# Patient Record
Sex: Male | Born: 1935 | Race: White | Hispanic: No | Marital: Married | Smoking: Current every day smoker
Health system: Southern US, Community
[De-identification: ages and names within clinical notes are randomized; demographics above are authoritative.]

## PROBLEM LIST (undated history)

## (undated) DIAGNOSIS — M1990 Unspecified osteoarthritis, unspecified site: Secondary | ICD-10-CM

## (undated) DIAGNOSIS — I4891 Unspecified atrial fibrillation: Secondary | ICD-10-CM

## (undated) DIAGNOSIS — I10 Essential (primary) hypertension: Secondary | ICD-10-CM

## (undated) DIAGNOSIS — Z720 Tobacco use: Secondary | ICD-10-CM

## (undated) DIAGNOSIS — J449 Chronic obstructive pulmonary disease, unspecified: Secondary | ICD-10-CM

## (undated) DIAGNOSIS — G960 Cerebrospinal fluid leak: Secondary | ICD-10-CM

## (undated) DIAGNOSIS — I2510 Atherosclerotic heart disease of native coronary artery without angina pectoris: Secondary | ICD-10-CM

## (undated) DIAGNOSIS — I509 Heart failure, unspecified: Secondary | ICD-10-CM

## (undated) DIAGNOSIS — L409 Psoriasis, unspecified: Secondary | ICD-10-CM

## (undated) DIAGNOSIS — J8410 Pulmonary fibrosis, unspecified: Secondary | ICD-10-CM

## (undated) DIAGNOSIS — F1010 Alcohol abuse, uncomplicated: Secondary | ICD-10-CM

## (undated) DIAGNOSIS — C4490 Unspecified malignant neoplasm of skin, unspecified: Secondary | ICD-10-CM

## (undated) HISTORY — PX: ROTATOR CUFF REPAIR: SHX139

## (undated) HISTORY — PX: CHEST TUBE INSERTION: SHX231

## (undated) HISTORY — DX: Tobacco use: Z72.0

## (undated) HISTORY — PX: HAND SURGERY: SHX662

## (undated) HISTORY — DX: Pulmonary fibrosis, unspecified: J84.10

## (undated) HISTORY — DX: Unspecified osteoarthritis, unspecified site: M19.90

## (undated) HISTORY — DX: Essential (primary) hypertension: I10

## (undated) HISTORY — PX: INGUINAL HERNIA REPAIR: SHX194

---

## 1998-11-28 ENCOUNTER — Ambulatory Visit (HOSPITAL_BASED_OUTPATIENT_CLINIC_OR_DEPARTMENT_OTHER): Admission: RE | Admit: 1998-11-28 | Discharge: 1998-11-28 | Payer: Self-pay | Admitting: Orthopedic Surgery

## 2001-02-16 ENCOUNTER — Other Ambulatory Visit: Admission: RE | Admit: 2001-02-16 | Discharge: 2001-02-16 | Payer: Self-pay | Admitting: Dermatology

## 2005-01-07 ENCOUNTER — Emergency Department (HOSPITAL_COMMUNITY): Admission: EM | Admit: 2005-01-07 | Discharge: 2005-01-07 | Payer: Self-pay | Admitting: Emergency Medicine

## 2005-02-27 ENCOUNTER — Ambulatory Visit: Payer: Self-pay | Admitting: Family Medicine

## 2005-03-07 ENCOUNTER — Ambulatory Visit (HOSPITAL_COMMUNITY): Admission: RE | Admit: 2005-03-07 | Discharge: 2005-03-07 | Payer: Self-pay | Admitting: Family Medicine

## 2005-03-13 ENCOUNTER — Ambulatory Visit: Payer: Self-pay | Admitting: Family Medicine

## 2005-03-20 ENCOUNTER — Ambulatory Visit (HOSPITAL_COMMUNITY): Admission: RE | Admit: 2005-03-20 | Discharge: 2005-03-20 | Payer: Self-pay | Admitting: Family Medicine

## 2005-03-20 DIAGNOSIS — G9608 Other cranial cerebrospinal fluid leak: Secondary | ICD-10-CM

## 2005-03-20 HISTORY — DX: Other cranial cerebrospinal fluid leak: G96.08

## 2005-03-27 ENCOUNTER — Other Ambulatory Visit: Admission: RE | Admit: 2005-03-27 | Discharge: 2005-03-27 | Payer: Self-pay | Admitting: Dermatology

## 2005-09-19 ENCOUNTER — Ambulatory Visit: Payer: Self-pay | Admitting: Internal Medicine

## 2005-11-14 ENCOUNTER — Ambulatory Visit: Payer: Self-pay | Admitting: Internal Medicine

## 2005-12-17 ENCOUNTER — Ambulatory Visit: Payer: Self-pay | Admitting: Internal Medicine

## 2006-01-21 ENCOUNTER — Ambulatory Visit: Payer: Self-pay | Admitting: Internal Medicine

## 2006-04-23 ENCOUNTER — Encounter: Payer: Self-pay | Admitting: Internal Medicine

## 2006-05-29 ENCOUNTER — Ambulatory Visit: Payer: Self-pay | Admitting: Internal Medicine

## 2006-05-29 LAB — CONVERTED CEMR LAB
BUN: 10 mg/dL (ref 6–23)
CO2: 22 meq/L (ref 19–32)
Calcium: 9.4 mg/dL (ref 8.4–10.5)
Chloride: 101 meq/L (ref 96–112)
Cholesterol: 196 mg/dL (ref 0–200)
Creatinine, Ser: 0.67 mg/dL (ref 0.40–1.50)
Glucose, Bld: 91 mg/dL (ref 70–99)
HDL: 74 mg/dL (ref >39)
LDL Cholesterol: 109 mg/dL — ABNORMAL HIGH (ref 0–99)
Potassium: 4.5 meq/L (ref 3.5–5.3)
Sodium: 139 meq/L (ref 135–145)
Total CHOL/HDL Ratio: 2.6
Triglycerides: 67 mg/dL (ref <150)
VLDL: 13 mg/dL (ref 0–40)

## 2007-12-18 ENCOUNTER — Encounter (INDEPENDENT_AMBULATORY_CARE_PROVIDER_SITE_OTHER): Payer: Self-pay | Admitting: Internal Medicine

## 2007-12-24 ENCOUNTER — Ambulatory Visit: Payer: Self-pay | Admitting: Internal Medicine

## 2007-12-24 DIAGNOSIS — J309 Allergic rhinitis, unspecified: Secondary | ICD-10-CM | POA: Insufficient documentation

## 2008-01-05 ENCOUNTER — Encounter (INDEPENDENT_AMBULATORY_CARE_PROVIDER_SITE_OTHER): Payer: Self-pay | Admitting: Internal Medicine

## 2008-02-23 ENCOUNTER — Ambulatory Visit: Payer: Self-pay | Admitting: Internal Medicine

## 2008-02-23 LAB — CONVERTED CEMR LAB
Bilirubin Urine: NEGATIVE
Blood in Urine, dipstick: NEGATIVE
Glucose, Urine, Semiquant: NEGATIVE
Ketones, urine, test strip: NEGATIVE
Nitrite: NEGATIVE
Protein, U semiquant: NEGATIVE
Specific Gravity, Urine: 1.01
Urobilinogen, UA: NEGATIVE
WBC Urine, dipstick: NEGATIVE
pH: 7

## 2008-06-21 ENCOUNTER — Encounter (INDEPENDENT_AMBULATORY_CARE_PROVIDER_SITE_OTHER): Payer: Self-pay | Admitting: Internal Medicine

## 2008-09-29 ENCOUNTER — Encounter (INDEPENDENT_AMBULATORY_CARE_PROVIDER_SITE_OTHER): Payer: Self-pay | Admitting: Internal Medicine

## 2008-10-13 ENCOUNTER — Ambulatory Visit: Payer: Self-pay | Admitting: Internal Medicine

## 2008-10-14 ENCOUNTER — Encounter (INDEPENDENT_AMBULATORY_CARE_PROVIDER_SITE_OTHER): Payer: Self-pay | Admitting: Internal Medicine

## 2008-10-14 LAB — CONVERTED CEMR LAB
BUN: 13 mg/dL (ref 6–23)
CO2: 22 meq/L (ref 19–32)
Calcium: 9.7 mg/dL (ref 8.4–10.5)
Chloride: 103 meq/L (ref 96–112)
Cholesterol: 178 mg/dL (ref 0–200)
Creatinine, Ser: 0.83 mg/dL (ref 0.40–1.50)
Glucose, Bld: 94 mg/dL (ref 70–99)
HDL: 59 mg/dL (ref >39)
LDL Cholesterol: 105 mg/dL — ABNORMAL HIGH (ref 0–99)
Potassium: 4.7 meq/L (ref 3.5–5.3)
Sodium: 137 meq/L (ref 135–145)
Total CHOL/HDL Ratio: 3
Triglycerides: 70 mg/dL (ref <150)
VLDL: 14 mg/dL (ref 0–40)

## 2009-02-24 ENCOUNTER — Encounter (INDEPENDENT_AMBULATORY_CARE_PROVIDER_SITE_OTHER): Payer: Self-pay | Admitting: Internal Medicine

## 2009-04-18 ENCOUNTER — Encounter: Payer: Self-pay | Admitting: Internal Medicine

## 2009-04-18 ENCOUNTER — Ambulatory Visit (HOSPITAL_COMMUNITY): Admission: RE | Admit: 2009-04-18 | Discharge: 2009-04-18 | Payer: Self-pay | Admitting: Internal Medicine

## 2009-05-02 ENCOUNTER — Ambulatory Visit: Payer: Self-pay | Admitting: Internal Medicine

## 2009-05-02 LAB — CONVERTED CEMR LAB
Pro B Natriuretic peptide (BNP): 1531 pg/mL — ABNORMAL HIGH (ref 0.0–100.0)
Sed Rate: 3 mm/hr (ref 0–22)

## 2009-05-11 ENCOUNTER — Ambulatory Visit: Payer: Self-pay | Admitting: Internal Medicine

## 2009-05-11 ENCOUNTER — Encounter: Payer: Self-pay | Admitting: Internal Medicine

## 2009-05-11 ENCOUNTER — Ambulatory Visit: Payer: Self-pay | Admitting: Cardiology

## 2009-05-11 ENCOUNTER — Ambulatory Visit: Payer: Self-pay

## 2009-05-11 ENCOUNTER — Ambulatory Visit (HOSPITAL_COMMUNITY): Admission: RE | Admit: 2009-05-11 | Discharge: 2009-05-11 | Payer: Self-pay | Admitting: Internal Medicine

## 2009-05-11 DIAGNOSIS — I5021 Acute systolic (congestive) heart failure: Secondary | ICD-10-CM | POA: Insufficient documentation

## 2009-05-15 ENCOUNTER — Ambulatory Visit: Payer: Self-pay | Admitting: Internal Medicine

## 2009-05-15 ENCOUNTER — Ambulatory Visit: Payer: Self-pay | Admitting: Cardiology

## 2009-05-15 DIAGNOSIS — R0602 Shortness of breath: Secondary | ICD-10-CM | POA: Insufficient documentation

## 2009-05-16 LAB — CONVERTED CEMR LAB
BUN: 12 mg/dL (ref 6–23)
Basophils Absolute: 0 10*3/uL (ref 0.0–0.1)
Basophils Relative: 0 % (ref 0.0–3.0)
CO2: 31 meq/L (ref 19–32)
Calcium: 9.6 mg/dL (ref 8.4–10.5)
Chloride: 95 meq/L — ABNORMAL LOW (ref 96–112)
Creatinine, Ser: 1 mg/dL (ref 0.4–1.5)
Eosinophils Absolute: 0.2 10*3/uL (ref 0.0–0.7)
Eosinophils Relative: 2.3 % (ref 0.0–5.0)
Ferritin: 135.9 ng/mL (ref 22.0–322.0)
GFR calc non Af Amer: 78.24 mL/min (ref >60)
Glucose, Bld: 72 mg/dL (ref 70–99)
HCT: 53.4 % — ABNORMAL HIGH (ref 39.0–52.0)
Hemoglobin: 18.1 g/dL — ABNORMAL HIGH (ref 13.0–17.0)
Lymphocytes Relative: 41.1 % (ref 12.0–46.0)
Lymphs Abs: 3.4 10*3/uL (ref 0.7–4.0)
MCHC: 33.9 g/dL (ref 30.0–36.0)
MCV: 106.6 fL — ABNORMAL HIGH (ref 78.0–100.0)
Monocytes Absolute: 0.8 10*3/uL (ref 0.1–1.0)
Monocytes Relative: 9.1 % (ref 3.0–12.0)
Neutro Abs: 3.9 10*3/uL (ref 1.4–7.7)
Neutrophils Relative %: 47.5 % (ref 43.0–77.0)
Platelets: 146 10*3/uL — ABNORMAL LOW (ref 150.0–400.0)
Potassium: 4.1 meq/L (ref 3.5–5.1)
Pro B Natriuretic peptide (BNP): 695 pg/mL — ABNORMAL HIGH (ref 0.0–100.0)
RBC: 5.01 M/uL (ref 4.22–5.81)
RDW: 12.9 % (ref 11.5–14.6)
Sodium: 136 meq/L (ref 135–145)
TSH: 1.89 microintl units/mL (ref 0.35–5.50)
WBC: 8.3 10*3/uL (ref 4.5–10.5)

## 2009-05-25 ENCOUNTER — Encounter (INDEPENDENT_AMBULATORY_CARE_PROVIDER_SITE_OTHER): Payer: Self-pay | Admitting: *Deleted

## 2009-05-25 ENCOUNTER — Ambulatory Visit: Payer: Self-pay | Admitting: Cardiology

## 2009-05-25 DIAGNOSIS — R21 Rash and other nonspecific skin eruption: Secondary | ICD-10-CM

## 2009-05-25 LAB — CONVERTED CEMR LAB
BUN: 13 mg/dL (ref 6–23)
Basophils Absolute: 0 10*3/uL (ref 0.0–0.1)
Basophils Relative: 0.4 % (ref 0.0–3.0)
CO2: 31 meq/L (ref 19–32)
Calcium: 9.4 mg/dL (ref 8.4–10.5)
Chloride: 99 meq/L (ref 96–112)
Creatinine, Ser: 1.1 mg/dL (ref 0.4–1.5)
Eosinophils Absolute: 0.2 10*3/uL (ref 0.0–0.7)
Eosinophils Relative: 2.3 % (ref 0.0–5.0)
GFR calc non Af Amer: 70.08 mL/min (ref >60)
Glucose, Bld: 115 mg/dL — ABNORMAL HIGH (ref 70–99)
HCT: 52.4 % — ABNORMAL HIGH (ref 39.0–52.0)
Hemoglobin: 17.4 g/dL — ABNORMAL HIGH (ref 13.0–17.0)
INR: 1 (ref 0.8–1.0)
Lymphocytes Relative: 54.6 % — ABNORMAL HIGH (ref 12.0–46.0)
Lymphs Abs: 5.6 10*3/uL — ABNORMAL HIGH (ref 0.7–4.0)
MCHC: 33.2 g/dL (ref 30.0–36.0)
MCV: 105.9 fL — ABNORMAL HIGH (ref 78.0–100.0)
Monocytes Absolute: 0.5 10*3/uL (ref 0.1–1.0)
Monocytes Relative: 5.3 % (ref 3.0–12.0)
Neutro Abs: 3.7 10*3/uL (ref 1.4–7.7)
Neutrophils Relative %: 37.4 % — ABNORMAL LOW (ref 43.0–77.0)
Platelets: 158 10*3/uL (ref 150.0–400.0)
Potassium: 4.3 meq/L (ref 3.5–5.1)
Prothrombin Time: 10.5 s (ref 9.1–11.7)
RBC: 4.95 M/uL (ref 4.22–5.81)
RDW: 12.9 % (ref 11.5–14.6)
Sodium: 138 meq/L (ref 135–145)
WBC: 10 10*3/uL (ref 4.5–10.5)
aPTT: 26.7 s (ref 21.7–28.8)

## 2009-05-26 ENCOUNTER — Telehealth: Payer: Self-pay | Admitting: Cardiology

## 2009-06-01 ENCOUNTER — Inpatient Hospital Stay (HOSPITAL_BASED_OUTPATIENT_CLINIC_OR_DEPARTMENT_OTHER): Admission: RE | Admit: 2009-06-01 | Discharge: 2009-06-01 | Payer: Self-pay | Admitting: Cardiovascular Disease

## 2009-06-01 ENCOUNTER — Ambulatory Visit: Payer: Self-pay | Admitting: Cardiology

## 2009-06-07 ENCOUNTER — Ambulatory Visit: Payer: Self-pay | Admitting: Internal Medicine

## 2009-06-16 ENCOUNTER — Ambulatory Visit: Payer: Self-pay | Admitting: Cardiology

## 2009-08-17 ENCOUNTER — Telehealth: Payer: Self-pay | Admitting: Cardiology

## 2009-08-17 ENCOUNTER — Ambulatory Visit: Payer: Self-pay | Admitting: Cardiology

## 2009-08-29 ENCOUNTER — Ambulatory Visit: Payer: Self-pay | Admitting: Cardiology

## 2009-08-29 LAB — CONVERTED CEMR LAB
BUN: 14 mg/dL (ref 6–23)
CO2: 31 meq/L (ref 19–32)
Calcium: 9.4 mg/dL (ref 8.4–10.5)
Chloride: 101 meq/L (ref 96–112)
GFR calc non Af Amer: 88.28 mL/min (ref >60)
Glucose, Bld: 107 mg/dL — ABNORMAL HIGH (ref 70–99)
Potassium: 4.8 meq/L (ref 3.5–5.1)
Sodium: 141 meq/L (ref 135–145)

## 2009-12-07 ENCOUNTER — Ambulatory Visit: Payer: Self-pay | Admitting: Cardiology

## 2009-12-21 ENCOUNTER — Ambulatory Visit: Payer: Self-pay

## 2009-12-21 ENCOUNTER — Ambulatory Visit (HOSPITAL_COMMUNITY): Admission: RE | Admit: 2009-12-21 | Discharge: 2009-12-21 | Payer: Self-pay | Admitting: Cardiology

## 2009-12-21 ENCOUNTER — Ambulatory Visit: Payer: Self-pay | Admitting: Cardiovascular Disease

## 2010-04-03 ENCOUNTER — Encounter: Payer: Self-pay | Admitting: Cardiology

## 2010-04-03 ENCOUNTER — Ambulatory Visit: Payer: Self-pay | Admitting: Cardiology

## 2010-04-04 ENCOUNTER — Telehealth (INDEPENDENT_AMBULATORY_CARE_PROVIDER_SITE_OTHER): Payer: Self-pay | Admitting: *Deleted

## 2010-04-04 DIAGNOSIS — I498 Other specified cardiac arrhythmias: Secondary | ICD-10-CM

## 2010-04-05 ENCOUNTER — Telehealth (INDEPENDENT_AMBULATORY_CARE_PROVIDER_SITE_OTHER): Payer: Self-pay | Admitting: *Deleted

## 2010-04-09 ENCOUNTER — Ambulatory Visit: Payer: Self-pay | Admitting: Cardiology

## 2010-04-10 ENCOUNTER — Ambulatory Visit: Payer: Self-pay | Admitting: Cardiology

## 2010-04-13 LAB — CONVERTED CEMR LAB
BUN: 16 mg/dL (ref 6–23)
CO2: 30 meq/L (ref 19–32)
Calcium: 9.4 mg/dL (ref 8.4–10.5)
Chloride: 96 meq/L (ref 96–112)
Creatinine, Ser: 0.9 mg/dL (ref 0.4–1.5)
GFR calc non Af Amer: 90.44 mL/min (ref >60)
Glucose, Bld: 98 mg/dL (ref 70–99)
Potassium: 4.7 meq/L (ref 3.5–5.1)
Sodium: 137 meq/L (ref 135–145)

## 2010-04-17 ENCOUNTER — Telehealth (INDEPENDENT_AMBULATORY_CARE_PROVIDER_SITE_OTHER): Payer: Self-pay | Admitting: *Deleted

## 2010-06-19 NOTE — Assessment & Plan Note (Signed)
Summary: Pulmonary/  final summary f/u pft's   Copy to:  Dr. Catalina Pizza. Primary Provider/Referring Provider:  Dr Catalina Pizza  CC:  1 month followup with PFT's.  Pt states that his breathing has improved.  He denies any complaints today.  States he quit smoking 1 month ago.Marland Kitchen  History of Present Illness: 2 yowm active smoker with with indolent onset new sob x 11/2008  quit 2010  May 02, 2009 cc gradually doe x walking with pushing produce ,  assoc with hacking cough assoc with abd cramps and diarrhea ?  worse since zoloft but no sign wt loss and minimal ILD by cxr 04/18/2009 by Dr Margo Aye with nl labs including tsh.     Pt denies any increase in rescue therapy over baseline, denies waking up needing it or having early am exacerbations of coughing/wheezing/ or dyspnea.  In fact, SABA not helping.  June 07, 2009 1 month followup with PFT's.  Pt states that his breathing has improved.  He denies any complaints today.  States he quit smoking 1 month ago.  cc sob up steps or hills and minimal hacking cough  Pt denies any significant sore throat, dysphagia, itching, sneezing,  nasal congestion or excess secretions,  fever, chills, sweats, unintended wt loss, pleuritic or exertional cp, hempoptysis, change in activity tolerance  orthopnea pnd or leg swelling Pt also denies any obvious fluctuation in symptoms with weather or environmental change or other alleviating or aggravating factors.     Pt denies any increase in rescue therapy over baseline, denies waking up needing it or having early am exacerbations of coughing/wheezing/ or dyspnea     Current Medications (verified): 1)  Proair Hfa 108 (90 Base) Mcg/act Aers (Albuterol Sulfate) .... 2 Puffs Every 4-6 Hours As Needed 2)  Zolpidem Tartrate 10 Mg Tabs (Zolpidem Tartrate) .Marland Kitchen.. 1 At Bedtime As Needed 3)  Nyquil 60-7.10-16-998 Mg/45ml Liqd (Pseudoeph-Doxylamine-Dm-Apap) .... As Directed At Bedtime As Needed 4)  Imodium A-D 2 Mg Tabs (Loperamide  Hcl) .... As Needed 5)  Beta Hc 1 % Lotn (Hydrocortisone) .... As Directed 6)  Lisinopril 10 Mg Tabs (Lisinopril) .... Take One Tablet By Mouth Daily 7)  Klor-Con M20 20 Meq Cr-Tabs (Potassium Chloride Crys Cr) .... One Tablet By Mouth Once Daily 8)  Aspirin 81 Mg Tbec (Aspirin) .... Take One Tablet By Mouth Daily 9)  Benadryl 25 Mg Caps (Diphenhydramine Hcl) .... 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 Mother-33--deceased from "male cancer" Father-75--brain aneurysm Brothers x 2--1 died with head trauma, 1 healthy sisters x 4--healthy Asthma- MGF  Social History: Reviewed history from 05/25/2009 and no changes required. 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  Smokes 2 ppd. Now discontinued Married Alcohol use-yes-4-5 beers per day  Vital Signs:  Patient profile:   75 year old male Weight:      175 pounds O2 Sat:      93 %  on Room air Temp:     97.2 degrees F oral Pulse rate:   40 / minute BP sitting:   120 / 60  (left arm)  Vitals Entered By: Vernie Murders (June 07, 2009 10:55 AM)  O2 Flow:  Room air  Physical Exam  Additional Exam:  wt  192 May 02, 2009 > 175 June 08, 2009  amb depressed white male with hopeless helpless affect and attitude HEENT mild turbinate edema.   Oropharynx no thrush or excess pnd or cobblestoning.  No JVD or cervical adenopathy. Mild accessory muscle hypertrophy. Trachea midline, nl thryroid. Chest was hyperinflated by percussion with diminished breath sounds and moderate increased exp time without wheeze. Hoover sign positive at mid inspiration. Regular rate and rhythm without murmur gallop or rub or increase P2 or edema.  Abd: no hsm, nl excursion. Ext warm without cyanosis or clubbing.   Severe  tar stains both hands   Impression & Recommendations:  Problem # 1:  COPD UNSPECIFIED (ICD-496) I had an extended summary discussion with the patient today lasting 15 to 20 minutes of a 25 minute visit on the following issues:   PFT's reviewed with pt and indiate GOLD II moderate copd   I took this opportunity to educate the patient regarding the consequences of smoking in airway disorders based on all the data we have from the multiple national lung health studies indicating that smoking cessation, not choice of inhalers or physicians, is the most important aspect of care.   ok to continue to focus on cigarette avoidance and return here prn  Problem # 2:  CONGESTIVE HEART FAILURE, ACUTE, SYSTOLIC (ICD-428.21)  Left heart cath data reviewed  The following medications were removed from the medication list:    Edecrin 25 Mg Tabs (Ethacrynic acid) .Marland Kitchen... Take 1 tab daily His updated medication list for this problem includes:    Lisinopril 10 Mg Tabs (Lisinopril) .Marland Kitchen... Take one tablet by mouth daily    Aspirin 81 Mg Tbec (Aspirin) .Marland Kitchen... Take one tablet by mouth daily  Tolerating ACE ok for now   Each maintenance medication was reviewed in detail including most importantly the difference between maintenance and as needed and under what circumstances the prns are to be used. pt strugggling with meds so we will keep things simple and refer back to Dr Margo Aye and see him prn  Orders: Est. Patient Level IV (16109)  Patient Instructions: 1)   Return to Dr Margo Aye with all your prescriptions that you're taking 2)  Return here if breathing or cough becoming limiting

## 2010-06-19 NOTE — Assessment & Plan Note (Signed)
Summary: F6W/DM   Referring Provider:  Dr. Catalina Pizza. Primary Provider:  Dr Catalina Pizza  CC:  follow up.  History of Present Illness: 75 year old male with no prior cardiac history who I recently saw for dyspnea and cardiomyopathy. Patient was seen recently by pulmonary for shortness of breath. It was noted that he has pulmonary fibrosis. However his dyspnea was felt to be out of proportion. A BNP was ordered and was elevated at 1531. An echocardiogram was performed  in the office in December of 2010 and showed an ejection fraction in the 20-25 range and mild  mitral regurgitation. We added aspirin, diuretics and ACE inhibitor.  Note a TSH and ferritin were normal. Note he had developed a rash and we were concerned that this was caused by his diuretic. He did not change this due to cost. Hdid undergo cardiac catheterization on June 01, 2009. There was no obstructive coronary disease and his ejection fraction was 25%. His PA pressure was 26/13. Since then he does have significant dyspnea on exertion relieved with rest. However there is no orthopnea, PND or pedal edema. He does not have chest pain associated with his dyspnea. Note his rash has improved somewhat. He has not been taking his lisinopril.  Current Medications (verified): 1)  Klor-Con M20 20 Meq Cr-Tabs (Potassium Chloride Crys Cr) .... One Tablet By Mouth Once Daily 2)  Aspirin 81 Mg Tbec (Aspirin) .... Take One Tablet By Mouth Daily 3)  Carvedilol 3.125 Mg Tabs (Carvedilol) .... Take One Tablet By Mouth Twice A Day 4)  Furosemide 40 Mg Tabs (Furosemide) .... Take One Tablet By Mouth Daily.  Allergies: No Known Drug Allergies  Past History:  Past Medical History: Reviewed history from 06/07/2009 and no changes required.  CONGESTIVE HEART FAILURE, ACUTE, SYSTOLIC (ICD-428.21) (NYHA Class III) CARDIOMYOPATHY (ICD-425.4)     - Left Heart cath 06/01/09  non obst coronary artery disease with EF 25% HYPERTENSION (ICD-401.9) PULMONARY  FIBROSIS ILD POST INFLAMMATORY CHRONIC (ICD-515) TOBACCO ABUSE (ICD-305.1) ALLERGIC RHINITIS (ICD-477.9) OSTEOARTHRITIS (ICD-715.90)  Abn cxr.........................................Marland KitchenWert    - vs baseline 12/2004 with basilar fibrotic changes    - PFT's 06/07/09 FEV1 1.98 (68%) raio 53 no better after B2,  DLC0105%  Past Surgical History: Reviewed history from 05/11/2009 and no changes required. Inguinal herniorrhaphy Rotator cuff repair Hand Surgery  Social History: Reviewed history from 05/25/2009 and no changes required. 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  Smokes 2 ppd. Now discontinued Married Alcohol use-yes-4-5 beers per day  Review of Systems       no fevers or chills, productive cough, hemoptysis, dysphasia, odynophagia, melena, hematochezia, dysuria, hematuria, rash, seizure activity, orthopnea, PND, pedal edema, claudication. Remaining systems are negative.   Vital Signs:  Patient profile:   74 year old male Height:      70 inches Weight:      174 pounds BMI:     25.06 Pulse rate:   48 / minute Resp:     12 per minute BP sitting:   118 / 70  (left arm)  Vitals Entered By: Kem Parkinson (August 17, 2009 8:09 AM)  Physical Exam  General:  Well-developed well-nourished in no acute distress.  Skin is warm and dry.  HEENT is normal.  Neck is supple. No thyromegaly.  Chest is clear to auscultation with normal expansion.  Cardiovascular exam is regular rate and rhythm.  Abdominal exam nontender or distended. No masses palpated. Extremities show no edema. neuro grossly intact  Impression & Recommendations:  Problem # 1:  CARDIOMYOPATHY (ICD-425.4) Continue aspirin, beta blocker and lasix; add lisinopril 5 mg p.o. daily. Check bmet in one week. Note I feel that his rash in the past was most likely secondary to his Lasix. However he could not afford ethacrynic acid and therefore continued the Lasix. If his rash worsens on  lisinopril and we will discontinue this and treat with an ARB. The following medications were removed from the medication list:    Lisinopril 10 Mg Tabs (Lisinopril) .Marland Kitchen... Take one tablet by mouth daily His updated medication list for this problem includes:    Aspirin 81 Mg Tbec (Aspirin) .Marland Kitchen... Take one tablet by mouth daily    Carvedilol 3.125 Mg Tabs (Carvedilol) .Marland Kitchen... Take one tablet by mouth twice a day    Furosemide 40 Mg Tabs (Furosemide) .Marland Kitchen... Take one tablet by mouth daily.  Problem # 2:  COPD UNSPECIFIED (ICD-496)  Management per pulmonary. The following medications were removed from the medication list:    Proair Hfa 108 (90 Base) Mcg/act Aers (Albuterol sulfate) .Marland Kitchen... 2 puffs every 4-6 hours as needed  The following medications were removed from the medication list:    Proair Hfa 108 (90 Base) Mcg/act Aers (Albuterol sulfate) .Marland Kitchen... 2 puffs every 4-6 hours as needed  Problem # 3:  CONGESTIVE HEART FAILURE, ACUTE, SYSTOLIC (ICD-428.21)  Continue present dose of diuretics and medications as outlined above. The following medications were removed from the medication list:    Lisinopril 10 Mg Tabs (Lisinopril) .Marland Kitchen... Take one tablet by mouth daily His updated medication list for this problem includes:    Aspirin 81 Mg Tbec (Aspirin) .Marland Kitchen... Take one tablet by mouth daily    Carvedilol 3.125 Mg Tabs (Carvedilol) .Marland Kitchen... Take one tablet by mouth twice a day    Furosemide 40 Mg Tabs (Furosemide) .Marland Kitchen... Take one tablet by mouth daily.    Lisinopril 5 Mg Tabs (Lisinopril) ..... One tablet by mouth once daily  The following medications were removed from the medication list:    Lisinopril 10 Mg Tabs (Lisinopril) .Marland Kitchen... Take one tablet by mouth daily His updated medication list for this problem includes:    Aspirin 81 Mg Tbec (Aspirin) .Marland Kitchen... Take one tablet by mouth daily    Carvedilol 3.125 Mg Tabs (Carvedilol) .Marland Kitchen... Take one tablet by mouth twice a day    Furosemide 40 Mg Tabs (Furosemide)  .Marland Kitchen... Take one tablet by mouth daily.    Lisinopril 5 Mg Tabs (Lisinopril) ..... One tablet by mouth once daily  Problem # 4:  HYPERTENSION (ICD-401.9)  Blood pressure controlled on present medications. Will continue. The following medications were removed from the medication list:    Lisinopril 10 Mg Tabs (Lisinopril) .Marland Kitchen... Take one tablet by mouth daily His updated medication list for this problem includes:    Aspirin 81 Mg Tbec (Aspirin) .Marland Kitchen... Take one tablet by mouth daily    Carvedilol 3.125 Mg Tabs (Carvedilol) .Marland Kitchen... Take one tablet by mouth twice a day    Furosemide 40 Mg Tabs (Furosemide) .Marland Kitchen... Take one tablet by mouth daily.    Lisinopril 5 Mg Tabs (Lisinopril) ..... One tablet by mouth once daily  The following medications were removed from the medication list:    Lisinopril 10 Mg Tabs (Lisinopril) .Marland Kitchen... Take one tablet by mouth daily His updated medication list for this problem includes:    Aspirin 81 Mg Tbec (Aspirin) .Marland Kitchen... Take one tablet by mouth daily    Carvedilol 3.125 Mg Tabs (Carvedilol) .Marland KitchenMarland KitchenMarland KitchenMarland Kitchen  Take one tablet by mouth twice a day    Furosemide 40 Mg Tabs (Furosemide) .Marland Kitchen... Take one tablet by mouth daily.    Lisinopril 5 Mg Tabs (Lisinopril) ..... One tablet by mouth once daily  Problem # 5:  SHORTNESS OF BREATH (ICD-786.05)  Most likely secondary to COPD. The following medications were removed from the medication list:    Lisinopril 10 Mg Tabs (Lisinopril) .Marland Kitchen... Take one tablet by mouth daily His updated medication list for this problem includes:    Aspirin 81 Mg Tbec (Aspirin) .Marland Kitchen... Take one tablet by mouth daily    Carvedilol 3.125 Mg Tabs (Carvedilol) .Marland Kitchen... Take one tablet by mouth twice a day    Furosemide 40 Mg Tabs (Furosemide) .Marland Kitchen... Take one tablet by mouth daily.    Lisinopril 5 Mg Tabs (Lisinopril) ..... One tablet by mouth once daily  The following medications were removed from the medication list:    Lisinopril 10 Mg Tabs (Lisinopril) .Marland Kitchen... Take one  tablet by mouth daily His updated medication list for this problem includes:    Aspirin 81 Mg Tbec (Aspirin) .Marland Kitchen... Take one tablet by mouth daily    Carvedilol 3.125 Mg Tabs (Carvedilol) .Marland Kitchen... Take one tablet by mouth twice a day    Furosemide 40 Mg Tabs (Furosemide) .Marland Kitchen... Take one tablet by mouth daily.    Lisinopril 5 Mg Tabs (Lisinopril) ..... One tablet by mouth once daily  Patient Instructions: 1)  Your physician recommends that you schedule a follow-up appointment in: 3 MONTHS 2)  Your physician recommends that you return for lab work in:ONE WEEK 3)  Your physician has recommended you make the following change in your medication: RESTART LISINOPRIL AT 5MG  ONCE DAILY

## 2010-06-19 NOTE — Procedures (Signed)
Summary: summary report  summary report   Imported By: Mirna Mires 04/18/2010 10:18:26  _____________________________________________________________________  External Attachment:    Type:   Image     Comment:   External Document

## 2010-06-19 NOTE — Letter (Signed)
Summary: Cardiac Catheterization Instructions- [facility]  [facility], Main Office  [address]   [place], [place] [address]   Phone: [phone]  Fax: [phone]     05/25/2009 MRN: [id number]  Chad Rogers [address] [place], [place]  [address]  Dear Mr. Aramburo,   You are scheduled for a Cardiac Catheterization on _thursday 1/13/11________ with Dr._cooper_____________  Please arrive to the 1st floor of the [facility] at [facility] at 7 am_____ am / pm on the day of your procedure. Please do not arrive before 6:30 a.m. Call the Heart and Vascular Center at [phone] if you are unable to make your appointmnet. The Code to get into the parking garage under the building is__0090______. Take the elevators to the 1st floor. You must have someone to drive you home. Someone must be with you for the first 24 hours after you arrive home. Please wear clothes that are easy to get on and off and wear slip-on shoes. Do not eat or drink after midnight except water with your medications that morning. Bring all your medications and current insurance cards with you.  ___ DO NOT take these medications before your procedure: __take all medications ____________________________________________________________  ___ Make sure you take your aspirin.  __x_ You may take ALL of your medications with water that morning. ________________________________________________________________________________________________________________________________  ___ DO NOT take ANY medications before your procedure.  ___ Pre-med instructions:  ________________________________________________________________________________________________________________________________  The usual length of stay after your procedure is 2 to 3 hours. This can vary.  If you have any questions, please call the office at the number listed above.   Ledon Snare, RN

## 2010-06-19 NOTE — Progress Notes (Signed)
  Phone Note Outgoing Call   Call placed by: Deliah Goody, RN,  April 17, 2010 4:02 PM Summary of Call: pt aware monitor shows sinus with PVC's, couplets and 3 beat run of NSVT Deliah Goody, RN  April 17, 2010 4:02 PM

## 2010-06-19 NOTE — Assessment & Plan Note (Signed)
Summary: 2WK F/U CATH DONE 06-01-09   Referring Provider:  Dr. Catalina Pizza. Primary Provider:  Dr Catalina Pizza   History of Present Illness: 75 year old male with no prior cardiac history who I recently saw for dyspnea and cardiomyopathy. Patient was seen recently by pulmonary for shortness of breath. It was noted that he has pulmonary fibrosis. However his dyspnea was felt to be out of proportion. A BNP was ordered and was elevated at 1531. An echocardiogram was performed  in the office in December of 2010 and showed an ejection fraction in the 20-25 range and mild  mitral regurgitation. We added aspirin, diuretics and ACE inhibitor. He returned later and was seen by Dr. Johney Frame. A followup BNP had improved to 695. Note a TSH and ferritin were normal. I last saw him in early January and scheduled him to have a cardiac catheterization. This was performed on June 01, 2008. He had nonobstructive coronary disease and an ejection fraction of 25%. His pulmonary capillary wedge pressure was 14. Also note we attempted to change his Lasix to ethacrynic acid previously as we felt it may be causing a rash. However he did not do this as there was significant experience with  the ethacrynic acid. Since his cardiac catheterization he has dyspnea with more extreme activities but not with routine activities. It is relieved with rest. There is no orthopnea, PND, pedal edema, palpitations, syncope or chest pain. Note his rash persists but his symptoms are controlled with Benadryl.  Current Medications (verified): 1)  Proair Hfa 108 (90 Base) Mcg/act Aers (Albuterol Sulfate) .... 2 Puffs Every 4-6 Hours As Needed 2)  Zolpidem Tartrate 10 Mg Tabs (Zolpidem Tartrate) .Marland Kitchen.. 1 At Bedtime As Needed 3)  Nyquil 60-7.10-16-998 Mg/5ml Liqd (Pseudoeph-Doxylamine-Dm-Apap) .... As Directed At Bedtime As Needed 4)  Imodium A-D 2 Mg Tabs (Loperamide Hcl) .... As Needed 5)  Beta Hc 1 % Lotn (Hydrocortisone) .... As Directed 6)   Lisinopril 10 Mg Tabs (Lisinopril) .... Take One Tablet By Mouth Daily 7)  Klor-Con M20 20 Meq Cr-Tabs (Potassium Chloride Crys Cr) .... One Tablet By Mouth Once Daily 8)  Aspirin 81 Mg Tbec (Aspirin) .... Take One Tablet By Mouth Daily 9)  Benadryl 25 Mg Caps (Diphenhydramine Hcl) .... 2-4 Daily  Allergies: No Known Drug Allergies  Past History:  Past Medical History: Reviewed history from 06/07/2009 and no changes required.  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 Inguinal herniorrhaphy Rotator cuff repair Hand Surgery  Social History: Reviewed history from 05/25/2009 and no changes required. Occupation: driver for [employer] Current Smoker since age 44.  Smokes 2 ppd. Now discontinued Married Alcohol use-yes-4-5 beers per day  Review of Systems       Significant rash but no fevers or chills, productive cough, hemoptysis, dysphasia, odynophagia, melena, hematochezia, dysuria, hematuria,  seizure activity, orthopnea, PND, pedal edema, claudication. Remaining systems are negative.   Vital Signs:  Patient profile:   75 year old male Height:      70 inches Weight:  174 pounds BMI:     25.06 Pulse rate:   40 / minute Resp:     12 per minute BP sitting:   125 / 69  (left arm)  Vitals Entered By: Kem Parkinson (June 16, 2009 8:30 AM)  Physical Exam  General:  Well-developed well-nourished in no  acute distress.  Skin is warm and dry. Diffuse rash.  HEENT is normal.  Neck is supple. No thyromegaly.  Chest is clear to auscultation with normal expansion.  Cardiovascular exam is regular rate and rhythm.  Abdominal exam nontender or distended. No masses palpated. Right groin shows no hematoma and no bruit. Extremities show no edema. neuro grossly intact    Impression & Recommendations:  Problem # 1:  CARDIOMYOPATHY (ICD-425.4) Patient is euvolemic on examination. I will continue with his lisinopril and we will add Coreg 3.125 mg p.o. b.i.d. We will continue to titrate these medications and once fully titrated we will repeat his echocardiogram to see if his LV function has improved. The etiology of his cardiomyopathy is unclear. Cardiac catheterization revealed no obstructive coronary disease. He will continue on his Lasix at present dose. He states he cannot afford the ethacrynic acid. I have asked our pharmacist to see if he qualifies for patient assistance. If so we will discontinue the Lasix and begin ethacrynic acid to see if this is causing his rash. His updated medication list for this problem includes:    Lisinopril 10 Mg Tabs (Lisinopril) .Marland Kitchen... Take one tablet by mouth daily    Aspirin 81 Mg Tbec (Aspirin) .Marland Kitchen... Take one tablet by mouth daily    Carvedilol 3.125 Mg Tabs (Carvedilol) .Marland Kitchen... Take one tablet by mouth twice a day  Problem # 2:  CONGESTIVE HEART FAILURE, ACUTE, SYSTOLIC (ICD-428.21) As per #1. His updated medication list for this problem includes:    Lisinopril 10 Mg Tabs (Lisinopril) .Marland Kitchen... Take one tablet by mouth daily    Aspirin 81 Mg Tbec (Aspirin) .Marland Kitchen... Take one tablet by mouth daily    Carvedilol 3.125 Mg Tabs (Carvedilol) .Marland Kitchen... Take one tablet by mouth twice a day  Problem # 3:  COPD UNSPECIFIED (ICD-496) Management per pulmonary. His updated medication list for this problem includes:    Proair Hfa 108 (90 Base) Mcg/act Aers (Albuterol sulfate) .Marland Kitchen... 2  puffs every 4-6 hours as needed  Problem # 4:  SKIN RASH (ICD-782.1) I feel this is most likely related to Lasix. We are trying to obtain assistance for ethacrynic acid as described above.  Problem # 5:  HYPERTENSION (ICD-401.9) Blood pressure controlled on present medications. Will continue. His updated medication list for this problem includes:    Lisinopril 10 Mg Tabs (Lisinopril) .Marland Kitchen... Take one tablet by mouth daily    Aspirin 81 Mg Tbec (Aspirin) .Marland Kitchen... Take one tablet by mouth daily    Carvedilol 3.125 Mg Tabs (Carvedilol) .Marland Kitchen... Take one tablet by mouth twice a day  Problem # 6:  SHORTNESS OF BREATH (ICD-786.05) Most likely secondary to a combination of pulmonary fibrosis and heart failure. His updated medication list for this problem includes:    Lisinopril 10 Mg Tabs (Lisinopril) .Marland Kitchen... Take one tablet by mouth daily    Aspirin 81 Mg Tbec (Aspirin) .Marland Kitchen... Take one tablet by mouth daily    Carvedilol 3.125 Mg Tabs (Carvedilol) .Marland Kitchen... Take one tablet by mouth twice a day  Patient Instructions: 1)  Your physician recommends that you schedule a follow-up appointment in: 6-8 WEEKS 2)  Your physician has recommended you make  the following change in your medication: START CARVEDILOL 3.125MG  ONE TABLET TWICE DAILY Prescriptions: CARVEDILOL 3.125 MG TABS (CARVEDILOL) Take one tablet by mouth twice a day  #60 x 12   Entered by:   Deliah Goody, RN   Authorized by:   Ferman Hamming, MD, [facility]   Signed by:   Deliah Goody, RN on 06/16/2009   Method used:   [facility] to        [facility] [address]       [address] [place]  [address]       Ph: [phone]       Fax: [phone]   RxID:   [id number]

## 2010-06-19 NOTE — Progress Notes (Signed)
Summary: please call in rx  Phone Note Call from Patient Call back at Home Phone [phone]   Caller: pt Reason for Call: Refill Medication Summary of Call: pt needs lisinopril 5 mg called in he does not have any at home. [facility] [address] [phone] Initial call taken by: Faythe Ghee,  August 17, 2009 9:38 AM  Follow-up for Phone Call        gave  pt refills. called pt to let him know i sent refills to his pharmacy Follow-up by: Kem Parkinson,  August 17, 2009 9:48 AM    Prescriptions: LISINOPRIL 5 MG TABS (LISINOPRIL) ONE TABLET BY MOUTH ONCE DAILY  #30 x 12   Entered by:   Kem Parkinson   Authorized by:   Ferman Hamming, MD, [facility]   Signed by:   Kem Parkinson on 08/17/2009   Method used:   [facility]  [address]       [address] [place]  [address]       Ph: [phone]       Fax: [phone]   RxID:   [id number]

## 2010-06-19 NOTE — Miscellaneous (Signed)
Summary: Orders Update pft charges  Clinical Lists Changes  Orders: Added new Service order of Carbon Monoxide diffusing w/capacity (94720) - Signed Added new Service order of Lung Volumes (94240) - Signed Added new Service order of Spirometry (Pre & Post) (94060) - Signed 

## 2010-06-19 NOTE — Progress Notes (Signed)
Summary: holter monitor  Phone Note Outgoing Call   Call placed by: Marcos Eke,  April 05, 2010 10:35 AM Action Taken: Appt scheduled Summary of Call: Pt has appt. for monitor on Monday 11/21 at 1pm Initial call taken by: Marcos Eke,  April 05, 2010 10:36 AM

## 2010-06-19 NOTE — Progress Notes (Signed)
Summary: gentric medication   Phone Note Call from Patient Call back at Home Phone [phone]   Caller: Patient Reason for Call: Talk to Nurse Details for Reason: gentric medication for  EDECRIN 25 MG TABS take 1 tab daily. the cost is $85.00.  Initial call taken by: Lorne Skeens,  May 26, 2009 2:48 PM  Follow-up for Phone Call        discussed with dr Jens Som, pt instructed to stop lisinopril and to cont the lasix. if the rash does not get better off the lisinopril we are going to have to decide what to do. not alot of options if allergic to lasix. pt to call next week and let me know how he is doing. pt voiced understanding Deliah Goody, RN  May 26, 2009 5:37 PM

## 2010-06-19 NOTE — Cardiovascular Report (Signed)
Summary: Pre Op Orders  Pre Op Orders   Imported By: Roderic Ovens 06/01/2009 12:06:04  _____________________________________________________________________  External Attachment:    Type:   Image     Comment:   External Document

## 2010-06-19 NOTE — Progress Notes (Signed)
  Phone Note Outgoing Call   Call placed by: Deliah Goody, RN,  April 04, 2010 3:22 PM Summary of Call: dr Jens Som discussed pt with dr Marnee Spring hall. pt instructed to start coreg 3.125mg  two times a day. he will wear a monitor in one week to make sure his heart rate is doing ok. Deliah Goody, RN  April 04, 2010 3:24 PM   New Problems: BRADYCARDIA (ICD-427.89)   New Problems: BRADYCARDIA (ICD-427.89) New/Updated Medications: CARVEDILOL 3.125 MG TABS (CARVEDILOL) Take one tablet by mouth twice a day Prescriptions: CARVEDILOL 3.125 MG TABS (CARVEDILOL) Take one tablet by mouth twice a day  #60 x 12   Entered by:   Deliah Goody, RN   Authorized by:   Ferman Hamming, MD, [facility]   Signed by:   Deliah Goody, RN on 04/04/2010   Method used:   [facility]  [address]       [address], [place]  [address]       Ph: [phone]       Fax: [phone]   RxID:   [id number]

## 2010-06-19 NOTE — Assessment & Plan Note (Signed)
Summary: per check out/sf   Referring Provider:  Dr. Catalina Pizza. Primary Provider:  Dr Catalina Pizza  CC:  ROV -- itching all over.  History of Present Illness: 75 year old male with no prior cardiac history who I recently saw for dyspnea and cardiomyopathy. Patient was seen recently by pulmonary for shortness of breath. It was noted that he has pulmonary fibrosis. However his dyspnea was felt to be out of proportion. A BNP was ordered and was elevated at 1531. An echocardiogram was performed  in the office in December of 2010 and showed an ejection fraction in the 20-25 range and mild  mitral regurgitation. We added aspirin, diuretics and ACE inhibitor. He returned last week and was seen by Dr. Johney Frame. A followup BNP had improved to 695. Note a TSH and ferritin were normal. Since then the patient has developed a diffuse rash. He thinks this started after beginning medications when I saw him previously. He continues to have dyspnea on exertion but this is improved. It is relieved with rest. It is not associated with chest pain. There is no orthopnea or PND in his pedal edema has completely resolved. There is no syncope.  Current Medications (verified): 1)  Proair Hfa 108 (90 Base) Mcg/act Aers (Albuterol Sulfate) .... 2 Puffs Every 4-6 Hours As Needed 2)  Zolpidem Tartrate 10 Mg Tabs (Zolpidem Tartrate) .Marland Kitchen.. 1 At Bedtime As Needed 3)  Nyquil 60-7.10-16-998 Mg/47ml Liqd (Pseudoeph-Doxylamine-Dm-Apap) .... As Directed At Bedtime As Needed 4)  Imodium A-D 2 Mg Tabs (Loperamide Hcl) .... As Needed 5)  Beta Hc 1 % Lotn (Hydrocortisone) .... As Directed 6)  Lisinopril 10 Mg Tabs (Lisinopril) .... Take One Tablet By Mouth Daily 7)  Furosemide 40 Mg Tabs (Furosemide) .... Take One Tablet By Mouth Daily. 8)  Klor-Con M20 20 Meq Cr-Tabs (Potassium Chloride Crys Cr) .... One Tablet By Mouth Once Daily 9)  Aspirin 81 Mg Tbec (Aspirin) .... Take One Tablet By Mouth Daily 10)  Benadryl 25 Mg Caps (Diphenhydramine  Hcl) .... 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 Inguinal herniorrhaphy Rotator cuff repair Hand Surgery  Social History: Reviewed history from 05/11/2009 and no changes required. 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  Smokes 2 ppd. Now discontinued Married Alcohol use-yes-4-5 beers per day  Review of Systems       Positive rash but no fevers or chills, productive cough, hemoptysis, dysphasia, odynophagia, melena, hematochezia, dysuria, hematuria,  seizure activity, orthopnea, PND, pedal edema, claudication. Remaining systems are negative.   Vital Signs:  Patient profile:   75 year old male Height:      70 inches Weight:      172 pounds BMI:     24.77 Pulse rate:   72 / minute BP sitting:   116 / 80  (left arm) Cuff size:   regular  Vitals Entered By: Hardin Negus, RMA (May 25, 2009 8:55 AM)  Physical Exam  General:  Well-developed well-nourished in no acute distress.  Skin is warm and dry. Diffuse rash HEENT is normal.  Neck is supple. No thyromegaly.  Chest  is clear to auscultation with normal expansion.  Cardiovascular exam is regular rate and rhythm.  Abdominal exam nontender or distended. No masses palpated. Extremities show no edema. neuro grossly intact    Impression & Recommendations:  Problem # 1:  CARDIOMYOPATHY (ICD-425.4) Patient symptoms are much better.  However he has developed a rash. This appears to correlate with adding medications at our previous visit. I think the most likely culprit would be Lasix. We will discontinue this and add ethacrynic acid 25 mg p.o. daily. Hopefully his rash will improve. If not we will consider discontinuing his ACE inhibitor. I will plan to proceed with a right and left heart catheterization to exclude coronary disease as a cause of his cardiomyopathy. The risk and benefits have been discussed and he agrees to proceed. I will see him back in 2 weeks and if his rash has improved we will add Coreg at that time. No previous TSH and ferritin were normal. The following medications were removed from the medication list:    Furosemide 40 Mg Tabs (Furosemide) .Marland Kitchen... Take one tablet by mouth daily. His updated medication list for this problem includes:    Lisinopril 10 Mg Tabs (Lisinopril) .Marland Kitchen... Take one tablet by mouth daily    Aspirin 81 Mg Tbec (Aspirin) .Marland Kitchen... Take one tablet by mouth daily    Edecrin 25 Mg Tabs (Ethacrynic acid) .Marland Kitchen... Take 1 tab daily  Problem # 2:  SKIN RASH (ICD-782.1) We are discontinuing Lasix as described above.  Problem # 3:  CONGESTIVE HEART FAILURE, ACUTE, SYSTOLIC (ICD-428.21) Continue ACE inhibitor, aspirin and change diuretics as described above. Add Coreg in the future. The following medications were removed from the medication list:    Furosemide 40 Mg Tabs (Furosemide) .Marland Kitchen... Take one tablet by mouth daily. His updated medication list for this problem includes:    Lisinopril 10 Mg Tabs (Lisinopril) .Marland Kitchen... Take one tablet by mouth daily    Aspirin 81 Mg Tbec (Aspirin) .Marland Kitchen... Take one tablet by mouth daily    Edecrin 25 Mg Tabs (Ethacrynic acid) .Marland Kitchen... Take 1 tab daily  Orders: TLB-BMP (Basic Metabolic Panel-BMET) (80048-METABOL) TLB-CBC Platelet - w/Differential (85025-CBCD) TLB-PT (Protime) (85610-PTP)  The following medications were removed from the medication list:    Furosemide 40 Mg  Tabs (Furosemide) .Marland Kitchen... Take one tablet by mouth daily. His updated medication list for this problem includes:    Lisinopril 10 Mg Tabs (Lisinopril) .Marland Kitchen... Take one tablet by mouth daily    Aspirin 81 Mg Tbec (Aspirin) .Marland Kitchen... Take one tablet by mouth daily  Problem # 4:  HYPERTENSION (ICD-401.9) Blood pressure controlled on present medications. I will check a Bmet next week after changing diuretics. The following medications were removed from the medication list:    Furosemide 40 Mg Tabs (Furosemide) .Marland Kitchen... Take one tablet by mouth daily. His updated medication list for this problem includes:    Lisinopril 10 Mg Tabs (Lisinopril) .Marland Kitchen... Take one tablet by mouth daily    Aspirin 81 Mg Tbec (Aspirin) .Marland Kitchen... Take one tablet by mouth daily    Edecrin 25 Mg Tabs (Ethacrynic acid) .Marland Kitchen... Take 1 tab daily  The following medications were removed from the medication list:    Furosemide 40 Mg Tabs (Furosemide) .Marland Kitchen... Take one tablet by mouth daily. His updated medication list for this problem includes:    Lisinopril 10 Mg Tabs (Lisinopril) .Marland Kitchen... Take one tablet by mouth daily    Aspirin 81 Mg Tbec (Aspirin) .Marland Kitchen... Take one tablet by mouth daily  Problem #  5:  SHORTNESS OF BREATH (ICD-786.05)  This is most likely related to a combination of heart failure but also to pulmonary fibrosis. The following medications were removed from the medication list:    Furosemide 40 Mg Tabs (Furosemide) .Marland Kitchen... Take one tablet by mouth daily. His updated medication list for this problem includes:    Lisinopril 10 Mg Tabs (Lisinopril) .Marland Kitchen... Take one tablet by mouth daily    Aspirin 81 Mg Tbec (Aspirin) .Marland Kitchen... Take one tablet by mouth daily    Edecrin 25 Mg Tabs (Ethacrynic acid) .Marland Kitchen... Take 1 tab daily  Orders: Cardiac Catheterization (Cardiac Cath)  The following medications were removed from the medication list:    Furosemide 40 Mg Tabs (Furosemide) .Marland Kitchen... Take one tablet by mouth daily. His updated medication list for  this problem includes:    Lisinopril 10 Mg Tabs (Lisinopril) .Marland Kitchen... Take one tablet by mouth daily    Aspirin 81 Mg Tbec (Aspirin) .Marland Kitchen... 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  Problem # 7:  TOBACCO ABUSE (ICD-305.1) He has discontinued this.  Other Orders: TLB-PTT (85730-PTTL)  Patient Instructions: 1)  Your physician recommends that you return for lab work in: cbc, bmet pt, ptt 2)  Your physician has recommended you make the following change in your medication: please discontinue lasix and start edecrin  25mg  everyday Prescriptions: EDECRIN 25 MG TABS (ETHACRYNIC ACID) take 1 tab daily  #30 x 6   Entered by:   Ledon Snare, RN   Authorized by:   Ferman Hamming, MD, [facility]   Signed by:   Ledon Snare, RN on 05/25/2009   Method used:   [facility]  [address]       [address] [place]  [address]       Ph: [phone]       Fax: [phone]   RxID:   [id number]

## 2010-06-19 NOTE — Assessment & Plan Note (Signed)
Summary: 3 month rov.sl   Referring Provider:  Dr. Catalina Pizza. Primary Provider:  Dr Catalina Pizza   History of Present Illness: Pleasant gentleman with no prior cardiac history who I saw previously for dyspnea and cardiomyopathy. He also has pulmonary fibrosis.  A BNP was ordered and was elevated at 1531. An echocardiogram was performed  in the office in December of 2010 and showed an ejection fraction in the 20-25 range and mild  mitral regurgitation. We added aspirin, diuretics and ACE inhibitor.  Note a TSH and ferritin were normal. Note he had developed a rash and we were concerned that this was caused by his diuretic. He did not change this due to cost. He did undergo cardiac catheterization on June 01, 2009. There was no obstructive coronary disease and his ejection fraction was 25%. His PA pressure was 26/13. I last saw him in July of 2011 and ordered a f/u echo which was performed in August of 2011 and revealed and EF of 25%, mild LAE and mild MR. Since I last saw him,  he does have  dyspnea on exertion relieved with rest. However there is no orthopnea, PND or pedal edema. He does not have chest pain associated with his dyspnea.   Current Medications (verified): 1)  Klor-Con M20 20 Meq Cr-Tabs (Potassium Chloride Crys Cr) .... One Tablet By Mouth Once Daily 2)  Aspirin 81 Mg Tbec (Aspirin) .... Take One Tablet By Mouth Daily 3)  Furosemide 40 Mg Tabs (Furosemide) .... Take One Tablet By Mouth Daily. 4)  Lisinopril 10 Mg Tabs (Lisinopril) .... Take 1 Tablet Daily 5)  Proair Hfa 108 (90 Base) Mcg/act Aers (Albuterol Sulfate) .... As Directed  Allergies: No Known Drug Allergies  Past History:  Past Medical History:  CONGESTIVE HEART FAILURE, ACUTE, SYSTOLIC (ICD-428.21) (NYHA Class III) CARDIOMYOPATHY (ICD-425.4)     - Left Heart cath 06/01/09  non obst coronary artery disease with EF 25% HYPERTENSION (ICD-401.9) PULMONARY FIBROSIS ILD POST INFLAMMATORY CHRONIC (ICD-515) ALLERGIC  RHINITIS (ICD-477.9) OSTEOARTHRITIS (ICD-715.90)  Abn cxr.........................................Marland KitchenWert    - vs baseline 12/2004 with basilar fibrotic changes    - PFT's 06/07/09 FEV1 1.98 (68%) raio 53 no better after B2,  DLC0105%  Past Surgical History: Reviewed history from 05/11/2009 and no changes required. Inguinal herniorrhaphy Rotator cuff repair Hand Surgery  Social History: Reviewed history from 05/25/2009 and no changes required. Occupation: driver for [employer] Smoker since age 21.  Smokes 2 ppd. Now discontinued Married Alcohol use-yes-4-5 beers per day  Review of Systems       no fevers or chills, productive cough, hemoptysis, dysphasia, odynophagia, melena, hematochezia, dysuria, hematuria, rash, seizure activity, orthopnea, PND, pedal edema, claudication. Remaining systems are negative.   Vital Signs:  Patient profile:   75 year old male Height:      70 inches Weight:      182 pounds BMI:     26.21 Pulse rate:   66 / minute Resp:     14 per minute BP sitting:   136 / 81  (left arm)  Vitals Entered By: Kem Parkinson (April 03, 2010 12:37 PM)  Physical Exam  General:  Well-developed well-nourished in no acute distress.  Skin is warm and dry.  HEENT is normal.  Neck is supple. No thyromegaly.  Chest is clear to auscultation with normal expansion.  Cardiovascular exam is regular rate and rhythm.  Abdominal exam nontender or distended. No masses palpated. Extremities show no edema. neuro grossly intact    EKG  Procedure date:  04/03/2010  Findings:      Sinus rhythm with occasional PVC. Right bundle branch block. Left anterior fascicular block.  Impression & Recommendations:  Problem # 1:  CARDIOMYOPATHY (ICD-425.4) Plan continue present medications. Increase lisinopril to 20 mg p.o. daily. Check potassium and renal function in one week. His beta blocker was discontinued previously by Dr. Margo Aye. I will review this as he certainly would  benefit from a mortality standpoint. I also discussed ICD with the patient today. I explained the risk of sudden death and he does not wish to pursue ICD at this point. His updated medication list for this problem includes:    Aspirin 81 Mg Tbec (Aspirin) .Marland Kitchen... Take one tablet by mouth daily    Furosemide 40 Mg Tabs (Furosemide) .Marland Kitchen... Take one tablet by mouth daily.    Lisinopril 20 Mg Tabs (Lisinopril) .Marland Kitchen... Take one tablet by mouth daily  Problem # 2:  COPD UNSPECIFIED (ICD-496)  His updated medication list for this problem includes:    Proair Hfa 108 (90 Base) Mcg/act Aers (Albuterol sulfate) .Marland Kitchen... As directed  Problem # 3:  HYPERTENSION (ICD-401.9)  Blood pressure controlled on present medications. Increase lisinopril as described above. His updated medication list for this problem includes:    Aspirin 81 Mg Tbec (Aspirin) .Marland Kitchen... Take one tablet by mouth daily    Furosemide 40 Mg Tabs (Furosemide) .Marland Kitchen... Take one tablet by mouth daily.    Lisinopril 20 Mg Tabs (Lisinopril) .Marland Kitchen... Take one tablet by mouth daily  Orders: T-Basic Metabolic Panel [phone])  Patient Instructions: 1)  Your physician recommends that you return for lab work in:ONE WEEK IN [place] 2)  Your physician has recommended you make the following change in your medication: INCREASE LISINOPRIL 20MG  ONCE DAILY 3)  Your physician wants you to follow-up in: 6 MONTHS  You will receive a reminder letter in the mail two months in advance. If you don't receive a letter, please call our office to schedule the follow-up appointment. Prescriptions: LISINOPRIL 20 MG TABS (LISINOPRIL) Take one tablet by mouth daily  #30 x 12   Entered by:   Deliah Goody, RN   Authorized by:   Ferman Hamming, MD, [facility]   Signed by:   Deliah Goody, RN on 04/03/2010   Method used:   Electronically to        [facility] [address]ret[facility]       [address], [place]  [address]       Ph: [phone]       Fax: [phone]   RxID:    [id number]

## 2010-06-19 NOTE — Assessment & Plan Note (Signed)
Summary: F3M/DM   Referring Provider:  Dr. Catalina Pizza. Primary Provider:  Dr Catalina Pizza  CC:  sob...pt coreg was stopped by another md.  History of Present Illness: 75 year old male with no prior cardiac history who I recently saw for dyspnea and cardiomyopathy. Patient was seen recently by pulmonary for shortness of breath. It was noted that he has pulmonary fibrosis. However his dyspnea was felt to be out of proportion. A BNP was ordered and was elevated at 1531. An echocardiogram was performed  in the office in December of 2010 and showed an ejection fraction in the 20-25 range and mild  mitral regurgitation. We added aspirin, diuretics and ACE inhibitor.  Note a TSH and ferritin were normal. Note he had developed a rash and we were concerned that this was caused by his diuretic. He did not change this due to cost. He did undergo cardiac catheterization on June 01, 2009. There was no obstructive coronary disease and his ejection fraction was 25%. His PA pressure was 26/13. I last saw him in March of 2011. Since then he does have significant dyspnea on exertion relieved with rest. However there is no orthopnea, PND or pedal edema. He does not have chest pain associated with his dyspnea. Note his rash has improved.  Current Medications (verified): 1)  Klor-Con M20 20 Meq Cr-Tabs (Potassium Chloride Crys Cr) .... One Tablet By Mouth Once Daily 2)  Aspirin 81 Mg Tbec (Aspirin) .... Take One Tablet By Mouth Daily 3)  Furosemide 40 Mg Tabs (Furosemide) .... Take One Tablet By Mouth Daily. 4)  Lisinopril 5 Mg Tabs (Lisinopril) .... One Tablet By Mouth Once Daily 5)  Proair Hfa 108 (90 Base) Mcg/act Aers (Albuterol Sulfate) .... As Directed 6)  Fexofenadine Hcl 180 Mg Tabs (Fexofenadine Hcl) .Marland Kitchen.. 1  Tab By Mouth Once Daily  Allergies: No Known Drug Allergies  Past History:  Past Medical History: Reviewed history from 06/07/2009 and no changes required.  CONGESTIVE HEART FAILURE, ACUTE, SYSTOLIC  (ICD-428.21) (NYHA Class III) CARDIOMYOPATHY (ICD-425.4)     - Left Heart cath 06/01/09  non obst coronary artery disease with EF 25% HYPERTENSION (ICD-401.9) PULMONARY FIBROSIS ILD POST INFLAMMATORY CHRONIC (ICD-515) TOBACCO ABUSE (ICD-305.1) ALLERGIC RHINITIS (ICD-477.9) OSTEOARTHRITIS (ICD-715.90)  Abn cxr.........................................Marland KitchenWert    - vs baseline 12/2004 with basilar fibrotic changes    - PFT's 06/07/09 FEV1 1.98 (68%) raio 53 no better after B2,  DLC0105%  Past Surgical History: Reviewed history from 05/11/2009 and no changes required. Inguinal herniorrhaphy Rotator cuff repair Hand Surgery  Social History: Reviewed history from 05/25/2009 and no changes required. 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  Smokes 2 ppd. Now discontinued Married Alcohol use-yes-4-5 beers per day  Review of Systems       no fevers or chills, productive cough, hemoptysis, dysphasia, odynophagia, melena, hematochezia, dysuria, hematuria, rash, seizure activity, orthopnea, PND, pedal edema, claudication. Remaining systems are negative.   Vital Signs:  Patient profile:   75 year old male Height:      70 inches Weight:      182 pounds BMI:     26.21 Pulse rate:   70 / minute Resp:     12 per minute BP sitting:   126 / 62  (left arm)  Vitals Entered By: Kem Parkinson (December 07, 2009 8:40 AM)  Physical Exam  General:  Well-developed well-nourished in no acute distress.  Skin is warm and dry.  HEENT is normal.  Neck is supple. No thyromegaly.  Chest is clear to auscultation with normal expansion.  Cardiovascular exam is regular rate and rhythm.  Abdominal exam nontender or distended. No masses palpated. Extremities show no edema. neuro grossly intact    EKG  Procedure date:  12/07/2009  Findings:      Sinus rhythm with frequent PVCs and couplets. Right bundle branch block. Left anterior fascicular block.  Impression &  Recommendations:  Problem # 1:  COPD UNSPECIFIED (ICD-496)  Management per primary care. His updated medication list for this problem includes:    Proair Hfa 108 (90 Base) Mcg/act Aers (Albuterol sulfate) .Marland Kitchen... As directed  His updated medication list for this problem includes:    Proair Hfa 108 (90 Base) Mcg/act Aers (Albuterol sulfate) .Marland Kitchen... As directed  Problem # 2:  CONGESTIVE HEART FAILURE, ACUTE, SYSTOLIC (ICD-428.21) Continue aspirin, diuretics. Increase lisinopril to 10 mg p.o. daily. Check renal function and potassium as well as BNP in one week. Repeat echocardiogram. I think his dyspnea is predominantly related to COPD/pulmonary fibrosis. No evidence of volume overload on examination. The following medications were removed from the medication list:    Carvedilol 3.125 Mg Tabs (Carvedilol) .Marland Kitchen... Take one tablet by mouth twice a day His updated medication list for this problem includes:    Aspirin 81 Mg Tbec (Aspirin) .Marland Kitchen... Take one tablet by mouth daily    Furosemide 40 Mg Tabs (Furosemide) .Marland Kitchen... Take one tablet by mouth daily.    Lisinopril 10 Mg Tabs (Lisinopril) .Marland Kitchen... Take 1 tablet daily  The following medications were removed from the medication list:    Carvedilol 3.125 Mg Tabs (Carvedilol) .Marland Kitchen... Take one tablet by mouth twice a day His updated medication list for this problem includes:    Aspirin 81 Mg Tbec (Aspirin) .Marland Kitchen... Take one tablet by mouth daily    Furosemide 40 Mg Tabs (Furosemide) .Marland Kitchen... Take one tablet by mouth daily.    Lisinopril 10 Mg Tabs (Lisinopril) .Marland Kitchen... Take 1 tablet daily  Problem # 3:  CARDIOMYOPATHY (ICD-425.4)  Patient's beta blocker was discontinued by his primary care for low blood counts per patient's report. I would like him on this in the future and we'll discuss with Dr. Margo Aye. The following medications were removed from the medication list:    Carvedilol 3.125 Mg Tabs (Carvedilol) .Marland Kitchen... Take one tablet by mouth twice a day His updated  medication list for this problem includes:    Aspirin 81 Mg Tbec (Aspirin) .Marland Kitchen... Take one tablet by mouth daily    Furosemide 40 Mg Tabs (Furosemide) .Marland Kitchen... Take one tablet by mouth daily.    Lisinopril 10 Mg Tabs (Lisinopril) .Marland Kitchen... Take 1 tablet daily  Orders: EKG w/ Interpretation (93000) Echocardiogram (Echo)  Problem # 4:  HYPERTENSION (ICD-401.9)  Blood pressure controlled on present medications. The following medications were removed from the medication list:    Carvedilol 3.125 Mg Tabs (Carvedilol) .Marland Kitchen... Take one tablet by mouth twice a day His updated medication list for this problem includes:    Aspirin 81 Mg Tbec (Aspirin) .Marland Kitchen... Take one tablet by mouth daily    Furosemide 40 Mg Tabs (Furosemide) .Marland Kitchen... Take one tablet by mouth daily.    Lisinopril 10 Mg Tabs (Lisinopril) .Marland Kitchen... Take 1 tablet daily  The following medications were removed from the medication list:    Carvedilol 3.125 Mg Tabs (Carvedilol) .Marland Kitchen... Take one tablet by mouth twice a day His updated medication list for this problem includes:    Aspirin 81 Mg Tbec (Aspirin) .Marland Kitchen... Take one tablet by mouth  daily    Furosemide 40 Mg Tabs (Furosemide) .Marland Kitchen... Take one tablet by mouth daily.    Lisinopril 10 Mg Tabs (Lisinopril) .Marland Kitchen... Take 1 tablet daily  Problem # 5:  PULMONARY FIBROSIS ILD POST INFLAMMATORY CHRONIC (ICD-515)  Patient Instructions: 1)  Your physician recommends that you schedule a follow-up appointment in: 3 months with Dr. Jens Som 2)  Your physician recommends that you return for lab work in 1 WEEK for a BMET, BNP  425.0, 786.05 with Dr. Margo Aye in [place] 3)  Your physician has recommended you make the following change in your medication: Lisinopril INCREASED TO 10MG .  1 TABLET DAILY 4)  Your physician has requested that you have an echocardiogram.  Echocardiography is a painless test that uses sound waves to create images of your heart. It provides your doctor with information about the size and shape of  your heart and how well your heart's chambers and valves are working.  This procedure takes approximately one hour. There are no restrictions for this procedure. Prescriptions: LISINOPRIL 10 MG TABS (LISINOPRIL) Take 1 tablet daily  #30 x 6   Entered by:   Lisabeth Devoid RN   Authorized by:   Ferman Hamming, MD, [facility]   Signed by:   Lisabeth Devoid RN on 12/07/2009   Method used:   [facility] to        [facility] [address]       [address] [place]  [address]       Ph: [phone]       Fax: [phone]   RxID:   [id number]

## 2010-06-19 NOTE — Procedures (Signed)
Summary: Summary Report  Summary Report   Imported By: Erle Crocker 04/27/2010 13:21:15  _____________________________________________________________________  External Attachment:    Type:   Image     Comment:   External Document

## 2010-08-05 LAB — POCT I-STAT 3, ART BLOOD GAS (G3+)
Acid-Base Excess: 3 mmol/L — ABNORMAL HIGH (ref 0.0–2.0)
Bicarbonate: 28.3 mEq/L — ABNORMAL HIGH (ref 20.0–24.0)
O2 Saturation: 90 %
TCO2: 30 mmol/L (ref 0–100)
pCO2 arterial: 44.5 mmHg (ref 35.0–45.0)
pH, Arterial: 7.411 (ref 7.350–7.450)
pO2, Arterial: 59 mmHg — ABNORMAL LOW (ref 80.0–100.0)

## 2010-08-05 LAB — POCT I-STAT 3, VENOUS BLOOD GAS (G3P V)
Acid-Base Excess: 1 mmol/L (ref 0.0–2.0)
Bicarbonate: 27.8 mEq/L — ABNORMAL HIGH (ref 20.0–24.0)
O2 Saturation: 61 %
TCO2: 29 mmol/L (ref 0–100)
pCO2, Ven: 49.9 mmHg (ref 45.0–50.0)
pH, Ven: 7.354 — ABNORMAL HIGH (ref 7.250–7.300)
pO2, Ven: 34 mmHg (ref 30.0–45.0)

## 2010-09-05 ENCOUNTER — Other Ambulatory Visit: Payer: Self-pay | Admitting: Cardiology

## 2010-09-05 MED ORDER — LISINOPRIL 20 MG PO TABS: 20.0000 mg | ORAL_TABLET | Freq: Every day | ORAL | Status: DC

## 2010-10-05 NOTE — Procedures (Signed)
NAME:  Chad Rogers, Chad Rogers NO.:  [id number]   MEDICAL RECORD NO.:  [id number]          PATIENT TYPE:  OUT   LOCATION:  RAD                           FACILITY:  [facility]   PHYSICIAN:  Dani Gobble, MD       DATE OF BIRTH:  10/24/1935   DATE OF PROCEDURE:  03/07/2005  DATE OF DISCHARGE:                                  ECHOCARDIOGRAM   REFERRING PHYSICIAN:  Dorthula Rue. Early Chars, MD.   INDICATIONS:  Cardiac murmur.   The technical quality of the study is adequate.   M-MODE TRACINGS:  The aorta measures normally at 3.6 cm.   Left atrium also measures normally at 3.8 cm.  The patient appeared to be in  sinus rhythm with ectopic beats during the procedure.   The interventricular septum and posterior wall are mildly thickened and  measured at 1.2 cm and 1.3 cm, respectively.   The aortic valve appeared to be trileaflet and pliable with normal leaflet  excursion.  No significant aortic insufficiency is noted.  Doppler  interrogation of the aortic valve was within normal limits.   Mitral valve appeared minimally thickened, but with normal leaflet  excursion.  No mitral valve prolapse is noted.  Mild mitral annular  calcification is noted.  Trivial mitral regurgitation is noted.  Doppler  interrogation of the mitral valve is within normal limits.   The pulmonic valve was not well-visualized.   Tricuspid valve appeared grossly structurally normal with trace tricuspid  regurgitation noted.   Left ventricle measures 5.1 in diastole and 3.7 in systole.  Overall left  ventricular systolic function is mildly reduced with an estimated ejection  fraction of 50%.  There is mild global hypokinesis.  The presence of  diastolic dysfunction is inferred from pulse wave Doppler across the mitral  valve.   The right atrium and right ventricle appear to be normal in size and  ventricular systolic function appears to be normal.   IMPRESSION:  1.  Mild concentric left ventricular  hypertrophy.  2.  Mild thickening of the mitral valve with normal leaflet excursion.  3.  Mild mitral annular calcification.  4.  Normal left ventricular size with mild global hypokinesis and an      estimated ejection fraction mildly reduced and estimated at 50%.  5.  The presence of diastolic dysfunction is inferred from pulse wave      Doppler across the mitral valve.           ______________________________  Dani Gobble, MD     AB/MEDQ  D:  03/20/2005  T:  03/21/2005  Job:  [id number]

## 2010-12-02 ENCOUNTER — Other Ambulatory Visit: Payer: Self-pay | Admitting: Cardiology

## 2011-01-08 ENCOUNTER — Other Ambulatory Visit: Payer: Self-pay

## 2011-01-08 MED ORDER — POTASSIUM CHLORIDE 20 MEQ PO PACK: 20.0000 meq | PACK | Freq: Every day | ORAL | Status: DC

## 2011-01-08 NOTE — Telephone Encounter (Signed)
..   Requested Prescriptions   Pending Prescriptions Disp Refills  . potassium chloride (KLOR-CON) 20 MEQ packet 30 tablet 6    Sig: Take 20 mEq by mouth daily.

## 2011-04-18 ENCOUNTER — Encounter: Payer: Self-pay | Admitting: Cardiology

## 2011-04-18 NOTE — Progress Notes (Signed)
[id number] gentleman for fu of cardiomyopathy; also with ho pulmonary fibrosis. An echocardiogram was performed  in the office in December of 2010 and showed an ejection fraction in the 20-25 range and mild  mitral regurgitation. Note a TSH and ferritin were normal. Note he had developed a rash and we were concerned that this was caused by his diuretic. He did not change this due to cost. He did undergo cardiac catheterization on June 01, 2009. There was no obstructive coronary disease and his ejection fraction was 25%. His PA pressure was 26/13. F/u echo was performed in August of 2011 and revealed and EF of 25%, mild LAE and mild MR. Halter monitor in November of 2011 showed 3 beats nonsustained ventricular tachycardia. Patient declined ICD. Since I last saw him in Nov 2011,  he does have  dyspnea on exertion relieved with rest. However there is no orthopnea, PND or pedal edema. He does not have chest pain associated with his dyspnea.   Current Outpatient Prescriptions  Medication Sig Dispense Refill  . albuterol (PROVENTIL HFA) 108 (90 BASE) MCG/ACT inhaler Inhale 2 puffs into the lungs every 6 (six) hours as needed.        Marland Kitchen aspirin 81 MG tablet Take 81 mg by mouth daily.        . carvedilol (COREG) 3.125 MG tablet Take 3.125 mg by mouth 2 (two) times daily with a meal.        . furosemide (LASIX) 40 MG tablet TAKE 1 TABLET BY MOUTH EVERY DAY  30 tablet  5  . lisinopril (PRINIVIL,ZESTRIL) 20 MG tablet Take 1 tablet (20 mg total) by mouth daily.  30 tablet  6  . potassium chloride (KLOR-CON) 20 MEQ packet Take 20 mEq by mouth daily.  30 tablet  6     Past Medical History  Diagnosis Date  . Congestive heart failure     acute, systolic,  . Cardiomyopathy 1.13.2011    left heart cath  . HTN (hypertension)   . Pulmonary fibrosis     post inflammatory chronic  . Allergic rhinitis   . Osteoarthritis     Past Surgical History  Procedure Date  . Inguinal hernia repair   . Rotator cuff  repair   . Hand surgery     History   Social History  . Marital Status: Married    Spouse Name: N/A    Number of Children: N/A  . Years of Education: N/A   Occupational History  . driver     [employer]   Social History Main Topics  . Smoking status: Former Smoker -- 2.0 packs/day    Types: Cigarettes  . Smokeless tobacco: Not on file  . Alcohol Use: Yes     4 to 5 beers a day  . Drug Use: Not on file  . Sexually Active: Not on file   Other Topics Concern  . Not on file   Social History Narrative  . No narrative on file    ROS: no fevers or chills, productive cough, hemoptysis, dysphasia, odynophagia, melena, hematochezia, dysuria, hematuria, rash, seizure activity, orthopnea, PND, pedal edema, claudication. Remaining systems are negative.  Physical Exam: Well-developed well-nourished in no acute distress.  Skin is warm and dry.  HEENT is normal.  Neck is supple. No thyromegaly.  Chest is clear to auscultation with normal expansion.  Cardiovascular exam is regular rate and rhythm.  Abdominal exam nontender or distended. No masses palpated. Extremities show no edema. neuro grossly intact  ECG     This encounter was created in error - please disregard.

## 2011-05-15 ENCOUNTER — Encounter: Payer: Self-pay | Admitting: Cardiology

## 2011-07-12 ENCOUNTER — Other Ambulatory Visit: Payer: Self-pay | Admitting: Cardiology

## 2011-08-15 ENCOUNTER — Ambulatory Visit: Payer: Self-pay | Admitting: Cardiology

## 2011-08-20 ENCOUNTER — Encounter: Payer: Self-pay | Admitting: Cardiology

## 2011-08-20 ENCOUNTER — Ambulatory Visit (INDEPENDENT_AMBULATORY_CARE_PROVIDER_SITE_OTHER): Payer: Medicare Other | Admitting: Cardiology

## 2011-08-20 VITALS — BP 128/76 | HR 51 | Resp 16 | Ht 71.0 in | Wt 170.0 lb

## 2011-08-20 DIAGNOSIS — R079 Chest pain, unspecified: Secondary | ICD-10-CM

## 2011-08-20 DIAGNOSIS — J8410 Pulmonary fibrosis, unspecified: Secondary | ICD-10-CM | POA: Insufficient documentation

## 2011-08-20 DIAGNOSIS — I10 Essential (primary) hypertension: Secondary | ICD-10-CM

## 2011-08-20 DIAGNOSIS — I428 Other cardiomyopathies: Secondary | ICD-10-CM

## 2011-08-20 DIAGNOSIS — M1990 Unspecified osteoarthritis, unspecified site: Secondary | ICD-10-CM | POA: Insufficient documentation

## 2011-08-20 DIAGNOSIS — F17200 Nicotine dependence, unspecified, uncomplicated: Secondary | ICD-10-CM

## 2011-08-20 DIAGNOSIS — R0602 Shortness of breath: Secondary | ICD-10-CM

## 2011-08-20 DIAGNOSIS — Z720 Tobacco use: Secondary | ICD-10-CM | POA: Insufficient documentation

## 2011-08-20 DIAGNOSIS — J449 Chronic obstructive pulmonary disease, unspecified: Secondary | ICD-10-CM

## 2011-08-20 DIAGNOSIS — J4489 Other specified chronic obstructive pulmonary disease: Secondary | ICD-10-CM

## 2011-08-20 DIAGNOSIS — I429 Cardiomyopathy, unspecified: Secondary | ICD-10-CM | POA: Insufficient documentation

## 2011-08-20 MED ORDER — VARENICLINE TARTRATE 0.5 MG X 11 & 1 MG X 42 PO MISC: ORAL | Status: AC

## 2011-08-20 MED ORDER — CARVEDILOL 6.25 MG PO TABS: 6.2500 mg | ORAL_TABLET | Freq: Two times a day (BID) | ORAL | Status: DC

## 2011-08-20 MED ORDER — VARENICLINE TARTRATE 1 MG PO TABS: 1.0000 mg | ORAL_TABLET | Freq: Two times a day (BID) | ORAL | Status: AC

## 2011-08-20 NOTE — Assessment & Plan Note (Signed)
Blood pressure control is good; current medications will be continued. 

## 2011-08-20 NOTE — Assessment & Plan Note (Signed)
Cardiomyopathy will be reassessed with a repeat echocardiogram.  He likely will require adjustment of his medication for this process.  Initially, carvedilol will be increased to 6.25 mg twice a day.  Basic laboratory studies will also be performed.  I will reassess this nice gentleman after these initial studies have been completed.

## 2011-08-20 NOTE — Assessment & Plan Note (Addendum)
The patient has a history of chronic obstructive pulmonary disease and pulmonary fibrosis which certainly are contributing to his current symptoms.  Chest x-ray will be obtained for initial reevaluation of these processes as well as PFTs and an ABG.

## 2011-08-20 NOTE — Patient Instructions (Signed)
Your physician recommends that you schedule a follow-up appointment in: After testing is complete  Your physician recommends that you return for lab work in: This week  Stool cards x 3 and return to the office asap  Your physician has recommended you make the following change in your medication:  1 - START Chantix as directed 2 - INCREASE Coreg to 6.25 mg twice daily  STOP Smoking  Your physician has recommended that you have a pulmonary function test. Pulmonary Function Tests are a group of tests that measure how well air moves in and out of your lungs.  Your physician has requested that you have an echocardiogram. Echocardiography is a painless test that uses sound waves to create images of your heart. It provides your doctor with information about the size and shape of your heart and how well your heart's chambers and valves are working. This procedure takes approximately one hour. There are no restrictions for this procedure.  A chest x-ray takes a picture of the organs and structures inside the chest, including the heart, lungs, and blood vessels. This test can show several things, including, whether the heart is enlarges; whether fluid is building up in the lungs; and whether pacemaker / defibrillator leads are still in place.

## 2011-08-20 NOTE — Progress Notes (Signed)
Patient ID: Chad Rogers, male   DOB: 1935/06/20, 76 y.o.   MRN: [id number]  HPI: Patient is seen at his request after a 2 year hiatus for evaluation of progressive dyspnea.  His cardiology care was previously managed by Dr. Jens Som, but patient requests to continue care in the [place] office, since his home is very nearby.  He was diagnosed with cardiomyopathy approximately 16 months ago, responded well to initial therapy and underwent cardiac catheterization which excluded significant coronary artery disease.  Ejection fraction was 25% at that time.  He has a long history of cigarette smoking with perhaps a 100 pack year total consumption and recent use of 2-3 packs per day.  He has discontinued tobacco use for 2 intervals of a few months over the past 2 years, once without pharmacologic assistance and once with Chantix.  He reports difficulty maintaining sleep resulting in daytime somnolence and fatigue.  He has never been told that he snores.  Over the past 2 months, he has been unable to complete the tasks required at work as well as he had in the past.  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  Symptoms resolved with a brief rest.  He has no chronic cough nor sputum production.  He has never been hospitalized for pulmonary problems.    Prior to Admission medications   Medication Sig Start Date End Date Taking? Authorizing Provider  albuterol (PROVENTIL HFA) 108 (90 BASE) MCG/ACT inhaler Inhale 2 puffs into the lungs every 6 (six) hours as needed.     Yes Historical Provider, MD  aspirin 81 MG tablet Take 81 mg by mouth daily.     Yes Historical Provider, MD  furosemide (LASIX) 40 MG tablet TAKE 1 TABLET BY MOUTH EVERY DAY 07/12/11  Yes Lewayne Bunting, MD  lisinopril (PRINIVIL,ZESTRIL) 20 MG tablet Take 1 tablet (20 mg total) by mouth daily. 09/05/10  Yes Lewayne Bunting, MD  potassium chloride (KLOR-CON) 20 MEQ packet  Take 20 mEq by mouth daily. 01/08/11 01/08/12 Yes Lewayne Bunting, MD  carvedilol (COREG) 6.25 MG tablet Take 1 tablet (6.25 mg total) by mouth 2 (two) times daily with a meal. 08/20/11 08/19/12  Kathlen Brunswick, MD  varenicline (CHANTIX CONTINUING MONTH PAK) 1 MG tablet Take 1 tablet (1 mg total) by mouth 2 (two) times daily. 08/20/11 11/18/11  Kathlen Brunswick, MD  varenicline (CHANTIX STARTING MONTH PAK) 0.5 MG X 11 & 1 MG X 42 tablet Take one 0.5 mg tablet by mouth once daily for 3 days, then increase to one 0.5 mg tablet twice daily for 4 days, then increase to one 1 mg tablet twice daily. 08/20/11 11/18/11  Kathlen Brunswick, MD   No Known Allergies    Past medical history, social history, and family history reviewed and updated.  ROS: Denies orthopnea, PND, pedal edema, palpitations, lightheadedness or syncope.  He has had skin neoplasms removed from his face, ears and scalp in the past, but not in recent years.  All other systems are reviewed and are negative.  PHYSICAL EXAM: BP 128/76  Pulse 51  Resp 16  Ht 5\' 11"  (1.803 m)  Wt 77.111 kg (170 lb)  BMI 23.71 kg/m2  SpO2 97% ; Weight has decreased 12 pounds over the past 15 months. General-Well developed; no acute distress Body habitus-proportionate weight and height Neck-Mild JVD; Questionable left carotid bruits Lungs-clear lung fields; resonant to percussion; increased A-P  diameter; no marked prolongation of the expiratory phase Cardiovascular-normal PMI; normal S1 and S2; minimal systolic ejection murmur; rhythm irregularity Abdomen-normal bowel sounds; soft and non-tender without masses or organomegaly Musculoskeletal-No deformities, no cyanosis or clubbing Neurologic-Normal cranial nerves; symmetric strength and tone Skin-Warm, Multiple keratoses; ulcerated 1 cm circular lesion over the right cheek with some pigmented keratotic material at its margin, but appearing most likely to represent a basal cell carcinoma Extremities-1+ distal  pulses; no edema  EKG: Normal sinus rhythm with frequent premature ventricular depolarizations and 1st degree AV block; right bundle branch block; Probable prior anteroseptal infarction; extreme right axis; delayed R-wave progression.  No previous tracing for comparison.  ASSESSMENT AND PLAN:  [place] Bing, MD 08/20/2011 2:43 PM

## 2011-08-20 NOTE — Assessment & Plan Note (Signed)
I am thrilled to support patient's request for assistance with discontinuation of tobacco use.  A course of Chantix was prescribed and appropriate instructions given.

## 2011-08-23 ENCOUNTER — Encounter: Payer: Self-pay | Admitting: *Deleted

## 2011-08-27 ENCOUNTER — Ambulatory Visit (HOSPITAL_COMMUNITY): Payer: Medicare Other

## 2011-08-27 ENCOUNTER — Ambulatory Visit (HOSPITAL_COMMUNITY): Payer: Medicare Other | Attending: Cardiology

## 2011-09-02 ENCOUNTER — Encounter: Payer: Self-pay | Admitting: *Deleted

## 2011-09-05 ENCOUNTER — Ambulatory Visit (HOSPITAL_COMMUNITY)
Admission: RE | Admit: 2011-09-05 | Discharge: 2011-09-05 | Disposition: A | Discharge status: Home / Self Care | Payer: Medicare Other | Source: Ambulatory Visit | Attending: Cardiology | Admitting: Cardiology

## 2011-09-05 ENCOUNTER — Other Ambulatory Visit: Payer: Self-pay | Admitting: Cardiology

## 2011-09-05 DIAGNOSIS — R0602 Shortness of breath: Secondary | ICD-10-CM

## 2011-09-05 DIAGNOSIS — J4489 Other specified chronic obstructive pulmonary disease: Secondary | ICD-10-CM | POA: Insufficient documentation

## 2011-09-05 DIAGNOSIS — I10 Essential (primary) hypertension: Secondary | ICD-10-CM | POA: Insufficient documentation

## 2011-09-05 DIAGNOSIS — R0609 Other forms of dyspnea: Secondary | ICD-10-CM | POA: Insufficient documentation

## 2011-09-05 DIAGNOSIS — R0989 Other specified symptoms and signs involving the circulatory and respiratory systems: Secondary | ICD-10-CM | POA: Insufficient documentation

## 2011-09-05 DIAGNOSIS — J449 Chronic obstructive pulmonary disease, unspecified: Secondary | ICD-10-CM

## 2011-09-05 DIAGNOSIS — I517 Cardiomegaly: Secondary | ICD-10-CM | POA: Insufficient documentation

## 2011-09-05 DIAGNOSIS — R079 Chest pain, unspecified: Secondary | ICD-10-CM | POA: Insufficient documentation

## 2011-09-05 DIAGNOSIS — Z87891 Personal history of nicotine dependence: Secondary | ICD-10-CM | POA: Insufficient documentation

## 2011-09-05 LAB — BLOOD GAS, ARTERIAL
Patient temperature: 37
TCO2: 23.6 mmol/L (ref 0–100)
pCO2 arterial: 41.4 mmHg (ref 35.0–45.0)
pH, Arterial: 7.443 (ref 7.350–7.450)

## 2011-09-05 MED ORDER — ALBUTEROL SULFATE (5 MG/ML) 0.5% IN NEBU
2.5000 mg | INHALATION_SOLUTION | Freq: Once | RESPIRATORY_TRACT | Status: AC
  Administered 2011-09-05: 2.5 mg via RESPIRATORY_TRACT

## 2011-09-06 LAB — COMPREHENSIVE METABOLIC PANEL
AST: 19 U/L (ref 0–37)
Albumin: 4.5 g/dL (ref 3.5–5.2)
Alkaline Phosphatase: 55 U/L (ref 39–117)
Potassium: 5 mEq/L (ref 3.5–5.3)
Sodium: 133 mEq/L — ABNORMAL LOW (ref 135–145)
Total Bilirubin: 0.6 mg/dL (ref 0.3–1.2)
Total Protein: 6.6 g/dL (ref 6.0–8.3)

## 2011-09-06 LAB — LIPID PANEL
LDL Cholesterol: 86 mg/dL (ref 0–99)
VLDL: 14 mg/dL (ref 0–40)

## 2011-09-06 LAB — TSH: TSH: 0.911 u[IU]/mL (ref 0.350–4.500)

## 2011-09-06 LAB — CBC
Hemoglobin: 16.2 g/dL (ref 13.0–17.0)
MCH: 34.9 pg — ABNORMAL HIGH (ref 26.0–34.0)
MCHC: 32.9 g/dL (ref 30.0–36.0)
Platelets: 218 10*3/uL (ref 150–400)
RDW: 13.2 % (ref 11.5–15.5)

## 2011-09-08 ENCOUNTER — Encounter: Payer: Self-pay | Admitting: Cardiology

## 2011-09-09 NOTE — Procedures (Signed)
NAME:  Chad Rogers, Chad Rogers NO.:  [id number]  MEDICAL RECORD NO.:  [id number]  LOCATION:                                 FACILITY:  PHYSICIAN:  [name], M.D.DATE OF BIRTH:  [birth year]  DATE OF PROCEDURE:  09/06/2011 DATE OF DISCHARGE:                           PULMONARY FUNCTION TEST   Reason for pulmonary function testing is COPD.  1. Spirometry shows a moderate-to-severe ventilatory defect with     airflow obstruction. 2. Lung volumes show marked air trapping. 3. DLCO is moderately reduced. 4. Airway resistance is high confirming airflow obstruction. 5. There is significant bronchodilator improvement. 6. This study is consistent with the clinical diagnosis of COPD.     [name], M.D.     ELH/MEDQ  D:  09/06/2011  T:  09/06/2011  Job:  [id number]  cc:   Gerrit Friends. Dietrich Pates, MD, [facility]

## 2011-09-10 ENCOUNTER — Ambulatory Visit (INDEPENDENT_AMBULATORY_CARE_PROVIDER_SITE_OTHER): Payer: Medicare Other | Admitting: Cardiology

## 2011-09-10 ENCOUNTER — Encounter: Payer: Self-pay | Admitting: Cardiology

## 2011-09-10 ENCOUNTER — Ambulatory Visit (HOSPITAL_COMMUNITY)
Admission: RE | Admit: 2011-09-10 | Discharge: 2011-09-10 | Disposition: A | Discharge status: Home / Self Care | Payer: Medicare Other | Source: Ambulatory Visit | Attending: Cardiology | Admitting: Cardiology

## 2011-09-10 DIAGNOSIS — J8410 Pulmonary fibrosis, unspecified: Secondary | ICD-10-CM

## 2011-09-10 DIAGNOSIS — I428 Other cardiomyopathies: Secondary | ICD-10-CM

## 2011-09-10 DIAGNOSIS — I10 Essential (primary) hypertension: Secondary | ICD-10-CM

## 2011-09-10 DIAGNOSIS — I519 Heart disease, unspecified: Secondary | ICD-10-CM

## 2011-09-10 LAB — PULMONARY FUNCTION TEST

## 2011-09-10 MED ORDER — IPRATROPIUM-ALBUTEROL 18-103 MCG/ACT IN AERO
2.0000 | INHALATION_SPRAY | Freq: Four times a day (QID) | RESPIRATORY_TRACT | Status: DC | PRN
Start: 2011-09-10 — End: 2013-03-08

## 2011-09-10 NOTE — Assessment & Plan Note (Addendum)
PFT's demonstrate clinical diagnosis of COPD.  Spirometry shows moderate-to-severe ventilatory defect with airflow obstruction. Lung volumes showed marked air trapping. DLCO is moderately reduced. Airway resistance is high, confirming airflow obstruction. There is significant bronchodilator improvement.    CXR showed emphysematous and chronic bronchitic changes consistent with COPD. No acute abnormalities.   Proventil will be changed to Combivent inhaler with spacer.

## 2011-09-10 NOTE — Patient Instructions (Addendum)
Your physician recommends that you schedule a follow-up appointment in: 3 months  Your physician has recommended you make the following change in your medication:  1 - DECREASE LASIX TO 20 MG DAILY UNTIL DIARRHEA RESOLVES THEN INCREASE IT BACK TO 40 MG DAILY, AS PREVIOUSLY PRESCRIBED. 2 - COMBIVENT INHALER WITH SPACER - 2 PUFFS EVERY 6 HOURS AS NEEDED

## 2011-09-10 NOTE — Progress Notes (Signed)
*  PRELIMINARY RESULTS* Echocardiogram 2D Echocardiogram has been performed.  Chad Rogers 09/10/2011, 9:50 AM

## 2011-09-10 NOTE — Progress Notes (Deleted)
**Note De-Identified  Obfuscation** Name: Chad Rogers    DOB: 11/19/1935  Age: 76 y.o.  MR#: [id number]       PCP:  Dwana Melena, MD, MD      Insurance: @PAYORNAME @   CC:    Chief Complaint  Patient presents with  . 2 week f/u    Pt. c/o SOB, fatigue and weakness. No meds/list.    VS BP 97/64  Pulse 61  Ht 5' 10.5" (1.791 m)  Wt 165 lb (74.844 kg)  BMI 23.34 kg/m2  SpO2 96%  Weights Current Weight  09/10/11 165 lb (74.844 kg)  08/20/11 170 lb (77.111 kg)  04/03/10 182 lb (82.555 kg)    Blood Pressure  BP Readings from Last 3 Encounters:  09/10/11 97/64  08/20/11 128/76  04/03/10 136/81     Admit date:  (Not on file) Last encounter with RMR:  09/08/2011   Allergy No Known Allergies  Current Outpatient Prescriptions  Medication Sig Dispense Refill  . albuterol (PROVENTIL HFA) 108 (90 BASE) MCG/ACT inhaler Inhale 2 puffs into the lungs every 6 (six) hours as needed.        Marland Kitchen aspirin 81 MG tablet Take 81 mg by mouth daily.        . carvedilol (COREG) 6.25 MG tablet Take 1 tablet (6.25 mg total) by mouth 2 (two) times daily with a meal.  60 tablet  12  . furosemide (LASIX) 40 MG tablet TAKE 1 TABLET BY MOUTH EVERY DAY  30 tablet  5  . lisinopril (PRINIVIL,ZESTRIL) 20 MG tablet Take 1 tablet (20 mg total) by mouth daily.  30 tablet  6  . potassium chloride (KLOR-CON) 20 MEQ packet Take 20 mEq by mouth daily.  30 tablet  6  . varenicline (CHANTIX CONTINUING MONTH PAK) 1 MG tablet Take 1 tablet (1 mg total) by mouth 2 (two) times daily.  60 tablet  0  . varenicline (CHANTIX STARTING MONTH PAK) 0.5 MG X 11 & 1 MG X 42 tablet Take one 0.5 mg tablet by mouth once daily for 3 days, then increase to one 0.5 mg tablet twice daily for 4 days, then increase to one 1 mg tablet twice daily.  53 tablet  0    Discontinued Meds:   There are no discontinued medications.  Patient Active Problem List  Diagnoses  . Cardiomyopathy  . Hypertension  . Pulmonary fibrosis  . Osteoarthritis  . Tobacco abuse     [facility] Hospital Outpatient Visit on 09/05/2011  Component Date Value  . FIO2 09/05/2011 0.21   . Delivery systems 09/05/2011 ROOM AIR   . pH, Arterial 09/05/2011 7.443   . pCO2 arterial 09/05/2011 41.4   . pO2, Arterial 09/05/2011 65.6*  . Bicarbonate 09/05/2011 27.8*  . TCO2 09/05/2011 23.6   . Acid-Base Excess 09/05/2011 3.9*  . O2 Saturation 09/05/2011 93.4   . Patient temperature 09/05/2011 37.0   . Collection site 09/05/2011 RIGHT RADIAL   . Drawn by 09/05/2011 [id number]   . Sample type 09/05/2011 ARTERIAL   . Allens test (pass/fail) 09/05/2011 PASS   Orders Only on 09/05/2011  Component Date Value  . WBC 09/05/2011 11.2*  . RBC 09/05/2011 4.64   . Hemoglobin 09/05/2011 16.2   . HCT 09/05/2011 49.2   . MCV 09/05/2011 106.0*  . [facility] 09/05/2011 34.9*  . MCHC 09/05/2011 32.9   . RDW 09/05/2011 13.2   . Platelets 09/05/2011 218   . Sodium 09/05/2011 133*  . Potassium 09/05/2011 5.0   . Chloride 09/05/2011 97   . **Note De-Identified  Obfuscation** CO2 09/05/2011 31   . Glucose, Bld 09/05/2011 98   . BUN 09/05/2011 14   . Creat 09/05/2011 0.92   . Total Bilirubin 09/05/2011 0.6   . Alkaline Phosphatase 09/05/2011 55   . AST 09/05/2011 19   . ALT 09/05/2011 24   . Total Protein 09/05/2011 6.6   . Albumin 09/05/2011 4.5   . Calcium 09/05/2011 9.9   . Cholesterol 09/05/2011 152   . Triglycerides 09/05/2011 70   . HDL 09/05/2011 52   . Total CHOL/HDL Ratio 09/05/2011 2.9   . VLDL 09/05/2011 14   . LDL Cholesterol 09/05/2011 86   . TSH 09/05/2011 0.911   . Brain Natriuretic Peptide 09/05/2011 1130.5*     Results for this Opt Visit:     Results for orders placed during the hospital encounter of 09/05/11  BLOOD GAS, ARTERIAL      Component Value Range   FIO2 0.21     Delivery systems ROOM AIR     pH, Arterial 7.443  7.350 - 7.450    pCO2 arterial 41.4  35.0 - 45.0 (mmHg)   pO2, Arterial 65.6 (*) 80.0 - 100.0 (mmHg)   Bicarbonate 27.8 (*) 20.0 - 24.0 (mEq/L)   TCO2 23.6  0 - 100 (mmol/L)    Acid-Base Excess 3.9 (*) 0.0 - 2.0 (mmol/L)   O2 Saturation 93.4     Patient temperature 37.0     Collection site RIGHT RADIAL     Drawn by [id number]     Sample type ARTERIAL     Allens test (pass/fail) PASS  PASS     EKG Orders placed in visit on 08/20/11  . EKG 12-LEAD     Prior Assessment and Plan Problem List as of 09/10/2011          Cardiology Problems   Cardiomyopathy   Last Assessment & Plan Note   08/20/2011 Office Visit Signed 08/20/2011  2:54 PM by Kathlen Brunswick, MD    Cardiomyopathy will be reassessed with a repeat echocardiogram.  He likely will require adjustment of his medication for this process.  Initially, carvedilol will be increased to 6.25 mg twice a day.  Basic laboratory studies will also be performed.  I will reassess this nice gentleman after these initial studies have been completed.    Hypertension   Last Assessment & Plan Note   08/20/2011 Office Visit Signed 08/20/2011  2:52 PM by Kathlen Brunswick, MD    Blood pressure control is good; current medications will be continued.      Other   Pulmonary fibrosis   Last Assessment & Plan Note   08/20/2011 Office Visit Addendum 08/26/2011  6:22 PM by Kathlen Brunswick, MD    The patient has a history of chronic obstructive pulmonary disease and pulmonary fibrosis which certainly are contributing to his current symptoms.  Chest x-ray will be obtained for initial reevaluation of these processes as well as PFTs and an ABG.    Osteoarthritis   Tobacco abuse   Last Assessment & Plan Note   08/20/2011 Office Visit Signed 08/20/2011  2:51 PM by Kathlen Brunswick, MD    I am thrilled to support patient's request for assistance with discontinuation of tobacco use.  A course of Chantix was prescribed and appropriate instructions given.        Imaging: Dg Chest 2 View  09/05/2011  *RADIOLOGY REPORT*  Clinical Data: COPD, chest pain, shortness of breath, worsening symptoms, history hypertension, smoking  CHEST - 2 **Note De-Identified  Obfuscation** VIEW   Comparison: 05/02/2009  Findings: Enlargement of cardiac silhouette. Atherosclerotic calcification aorta. Pulmonary vascularity normal. Calcified granuloma right mid lung. Emphysematous and chronic bronchitic changes. Questionable faint nodular density lower right chest approximately 14 x 8 mm in size is seen on initial view but not seen on a repeat view of the lung bases, likely summation artifact. No acute infiltrate, pleural effusion, or pneumothorax. Bones appear diffusely demineralized.  IMPRESSION: Enlargement of cardiac silhouette. Emphysematous and chronic bronchitic changes consistent with COPD. No acute abnormalities.  Original Report Authenticated By: Lollie Marrow, M.D.     [facility] Calculation: Score not calculated. Missing: Total Cholesterol

## 2011-09-10 NOTE — Progress Notes (Signed)
HPI: Mr. Hands is a 76 y/o patient of Dr.Rothbart who we are following with known history of systolic dysfunction, nonischemic CM per cardiac cath in 2011 revealed nonobstructive CAD, EF 25%, COPD. He was seen by Dr.Rothbart 2 weeks ago with complaints of DOE and fatigue. On that visit, an echocardiogram was ordered along with a CXR, CMET, BNP, TSH, lipids, and PFT's. He is here to discuss the results. Dr.Rothbart increased his coreg to 6.25 mg BID.   In the meantime he has had 10 days of watery diarrhea and nausea. He states he is keeping food down, but has up to 5 watery stools a day. He is to see Dr. Margo Aye in two days for this. He has quit smoking with use of Chantix prescribed.   No Known Allergies  Current Outpatient Prescriptions  Medication Sig Dispense Refill  . albuterol (PROVENTIL HFA) 108 (90 BASE) MCG/ACT inhaler Inhale 2 puffs into the lungs every 6 (six) hours as needed.        Marland Kitchen aspirin 81 MG tablet Take 81 mg by mouth daily.        . carvedilol (COREG) 6.25 MG tablet Take 1 tablet (6.25 mg total) by mouth 2 (two) times daily with a meal.  60 tablet  12  . furosemide (LASIX) 40 MG tablet TAKE 1 TABLET BY MOUTH EVERY DAY  30 tablet  5  . lisinopril (PRINIVIL,ZESTRIL) 20 MG tablet Take 1 tablet (20 mg total) by mouth daily.  30 tablet  6  . potassium chloride (KLOR-CON) 20 MEQ packet Take 20 mEq by mouth daily.  30 tablet  6  . varenicline (CHANTIX CONTINUING MONTH PAK) 1 MG tablet Take 1 tablet (1 mg total) by mouth 2 (two) times daily.  60 tablet  0  . varenicline (CHANTIX STARTING MONTH PAK) 0.5 MG X 11 & 1 MG X 42 tablet Take one 0.5 mg tablet by mouth once daily for 3 days, then increase to one 0.5 mg tablet twice daily for 4 days, then increase to one 1 mg tablet twice daily.  53 tablet  0  . albuterol-ipratropium (COMBIVENT) 18-103 MCG/ACT inhaler Inhale 2 puffs into the lungs every 6 (six) hours as needed for wheezing.  1 Inhaler  6    Past Medical History  Diagnosis  Date  . Cardiomyopathy 1.13.2011    Acute systolic CHF at presentation; 05/6107 cardiac cath- 06/01/09  non obst coronary artery disease with EF 25%  . Hypertension   . Pulmonary fibrosis     12/2004 with basilar fibrotic changes;  PFT's 06/07/09 FEV1 1.98 (68%) raio 53 no better after B2,  DLC0105%  . Allergic rhinitis   . Osteoarthritis   . Tobacco abuse     100 pack years; quit    Past Surgical History  Procedure Date  . Inguinal hernia repair   . Rotator cuff repair   . Hand surgery     UEA:VWUJWJ of systems complete and found to be negative unless listed above PHYSICAL EXAM BP 97/64  Pulse 61  Ht 5' 10.5" (1.791 m)  Wt 165 lb (74.844 kg)  BMI 23.34 kg/m2  SpO2 96%  General: Well developed, well nourished, pale and listless.in no acute distress Head: Eyes PERRLA, No xanthomas.   Normal cephalic and atramatic  Lungs: Clear bilaterally to auscultation and percussion. Heart: HRRR S1 S2, tachycardic without MRG.  Pulses are 2+ & equal.            No carotid bruit. No JVD.  No abdominal bruits. No femoral bruits. Abdomen: Bowel sounds are hyperactive, abdomen soft and with mild tenderness without masses or  Hernia's noted. Msk:  Back normal, normal gait. Normal strength and tone for age. Extremities: No clubbing, cyanosis or edema.  DP +1 Neuro: Alert and oriented X 3. Psych:  Good affect, responds appropriately     ASSESSMENT AND PLAN

## 2011-09-10 NOTE — Assessment & Plan Note (Addendum)
Repeat echo demonstrated EF of 20%, severe global hypokinesis,  We will need to increase medications slowly for optimal dosing. However, he has had frequent diarrhea will a 16 lb wt loss since last visit.  I will decrease his lasix to 20 mg daily until etiology of loose stools is addressed to avoid dehydration. The patient refuses repeat labs for potassium and kidney fx as he will be seeing Dr Margo Aye.  BP is low normal. He is a candidate for ICD insertion once he is over his GI illness and on optimal medical therapy. He will see Dr. Dietrich Pates in 3 months.

## 2011-09-10 NOTE — Assessment & Plan Note (Signed)
Low normal with increased dose of coreg and frequent loose stools, probably related to mild dehydration. Lasix has been decreased to 20 mg daily until diarrhea has been addressed.

## 2011-09-13 ENCOUNTER — Encounter: Payer: Self-pay | Admitting: Cardiology

## 2011-12-10 ENCOUNTER — Ambulatory Visit: Payer: Medicare Other | Admitting: Cardiology

## 2012-01-05 ENCOUNTER — Other Ambulatory Visit: Payer: Self-pay | Admitting: Cardiology

## 2012-01-08 ENCOUNTER — Other Ambulatory Visit: Payer: Self-pay

## 2012-01-08 MED ORDER — LISINOPRIL 20 MG PO TABS: 20.0000 mg | ORAL_TABLET | Freq: Every day | ORAL | Status: DC

## 2012-01-08 NOTE — Telephone Encounter (Signed)
..   Requested Prescriptions   Signed Prescriptions Disp Refills  . lisinopril (PRINIVIL,ZESTRIL) 20 MG tablet 30 tablet 6    Sig: Take 1 tablet (20 mg total) by mouth daily.    Authorizing Provider: Lewayne Bunting    Ordering User: Christella Hartigan, Cuauhtemoc Huegel Judie Petit

## 2012-01-30 ENCOUNTER — Other Ambulatory Visit: Payer: Self-pay | Admitting: Cardiology

## 2012-02-11 ENCOUNTER — Ambulatory Visit: Payer: Medicare Other | Admitting: Cardiology

## 2012-03-20 ENCOUNTER — Encounter: Payer: Self-pay | Admitting: Cardiology

## 2012-03-25 ENCOUNTER — Encounter: Payer: Self-pay | Admitting: Cardiology

## 2012-03-25 ENCOUNTER — Telehealth: Payer: Self-pay | Admitting: *Deleted

## 2012-03-25 DIAGNOSIS — E875 Hyperkalemia: Secondary | ICD-10-CM

## 2012-03-25 NOTE — Telephone Encounter (Signed)
Attempted to contact patient to advise him that Dr Dietrich Pates would like for him to discontinue potassium, due to K + of 5.6 and recheck level in 1 month.  Message left for a return call.

## 2012-04-22 ENCOUNTER — Other Ambulatory Visit: Payer: Self-pay | Admitting: *Deleted

## 2012-04-22 DIAGNOSIS — E875 Hyperkalemia: Secondary | ICD-10-CM

## 2012-05-18 ENCOUNTER — Encounter: Payer: Self-pay | Admitting: *Deleted

## 2012-07-13 ENCOUNTER — Other Ambulatory Visit: Payer: Self-pay | Admitting: Cardiology

## 2012-11-16 ENCOUNTER — Other Ambulatory Visit: Payer: Self-pay | Admitting: *Deleted

## 2012-11-16 MED ORDER — LISINOPRIL 20 MG PO TABS: 20.0000 mg | ORAL_TABLET | Freq: Every day | ORAL | Status: DC

## 2012-11-23 ENCOUNTER — Other Ambulatory Visit: Payer: Self-pay | Admitting: Cardiology

## 2013-01-04 ENCOUNTER — Other Ambulatory Visit: Payer: Self-pay | Admitting: Cardiology

## 2013-01-23 ENCOUNTER — Other Ambulatory Visit: Payer: Self-pay | Admitting: Cardiology

## 2013-02-08 ENCOUNTER — Encounter: Payer: Self-pay | Admitting: *Deleted

## 2013-02-22 ENCOUNTER — Telehealth: Payer: Self-pay | Admitting: *Deleted

## 2013-02-22 ENCOUNTER — Encounter: Payer: Self-pay | Admitting: Internal Medicine

## 2013-02-22 ENCOUNTER — Ambulatory Visit (INDEPENDENT_AMBULATORY_CARE_PROVIDER_SITE_OTHER): Payer: Medicare Other | Admitting: Internal Medicine

## 2013-02-22 VITALS — BP 107/73 | HR 67

## 2013-02-22 DIAGNOSIS — R0602 Shortness of breath: Secondary | ICD-10-CM

## 2013-02-22 DIAGNOSIS — I10 Essential (primary) hypertension: Secondary | ICD-10-CM

## 2013-02-22 NOTE — Telephone Encounter (Signed)
Patient states his inhaler is out.  Please call in ASAP so daughter can get before she leaves.albuterol (PROVENTIL HFA) 108 (90 BASE) MCG/ACT inhaler albuterol-ipratropium (COMBIVENT) 18-103 MCG/ACT inhaler  Needs both.

## 2013-02-22 NOTE — Progress Notes (Signed)
HPI  Patient is a 77 yo who is followed by Dr. Margo Aye  Seen in clinic on 9/23.  Complained of 2 wks LE swelling, cramping.  Drinking vinegar.  Not able to afford cardiology appt.  He was seen by R Rothbart in 2013  Hix NICM (no obstructive CAD at cath) LVEF 20% and COPD   Dr Margo Aye increased his lasix to 20 bid.  Also put him on KCL (not ChlorKon which he hs tolerated but another KCL formulation(  He stopped due to diarrhea. OVreall he thinks his swelling has gotten a little better  Not gone. Denies PND  No CP  Breathing a little short.    No Known Allergies  Current Outpatient Prescriptions  Medication Sig Dispense Refill  . albuterol (PROVENTIL HFA) 108 (90 BASE) MCG/ACT inhaler Inhale 2 puffs into the lungs every 6 (six) hours as needed.        Marland Kitchen albuterol-ipratropium (COMBIVENT) 18-103 MCG/ACT inhaler Inhale 2 puffs into the lungs every 6 (six) hours as needed for wheezing.  1 Inhaler  6  . ALPRAZolam (XANAX) 0.5 MG tablet Take 0.5 mg by mouth at bedtime as needed for sleep.      Marland Kitchen aspirin 81 MG tablet Take 81 mg by mouth daily.        . carvedilol (COREG) 6.25 MG tablet       . furosemide (LASIX) 40 MG tablet       . lisinopril (PRINIVIL,ZESTRIL) 20 MG tablet TAKE 1 TABLET BY MOUTH DAILY  30 tablet  0  . sertraline (ZOLOFT) 50 MG tablet Take 50 mg by mouth daily.      Marland Kitchen zolpidem (AMBIEN) 10 MG tablet Take 10 mg by mouth at bedtime as needed for sleep.      Marland Kitchen KLOR-CON M20 20 MEQ tablet TAKE 1 TABLET BY MOUTH EVERY DAY  30 tablet  1   No current facility-administered medications for this visit.    Past Medical History  Diagnosis Date  . Cardiomyopathy 1.13.2011    Acute systolic CHF at presentation; 05/6107 cardiac cath- 06/01/09  non obst coronary artery disease with EF 25%  . Hypertension   . Pulmonary fibrosis     12/2004 with basilar fibrotic changes;  PFT's 06/07/09 FEV1 1.98 (68%) raio 53 no better after B2,  DLC0105%  . Allergic rhinitis   . Osteoarthritis   . Tobacco abuse    100 pack years; quit    Past Surgical History  Procedure Laterality Date  . Inguinal hernia repair    . Rotator cuff repair    . Hand surgery      Family History  Problem Relation Age of Onset  . Cancer Mother 76    gynecologic  . Aneurysm Father 75    cerebral aneurysm  . Asthma Maternal Grandmother     History   Social History  . Marital Status: Married    Spouse Name: N/A    Number of Children: N/A  . Years of Education: N/A   Occupational History  . Driver     [employer]   Social History Main Topics  . Smoking status: Former Smoker -- 2.00 packs/day for 50 years    Types: Cigarettes  . Smokeless tobacco: Not on file  . Alcohol Use: 17.5 oz/week    35 drink(s) per week     Comment: 4 to 5 beers a day  . Drug Use: Not on file  . Sexual Activity: Not on file   Other Topics Concern  .  Not on file   Social History Narrative  . No narrative on file    Review of Systems:  All systems reviewed.  They are negative to the above problem except as previously stated.  Vital Signs: BP 107/73  Pulse 67  Physical Exam Patient is in NAD HEENT:  Normocephalic, atraumatic. EOMI, PERRLA.  Neck: JVP is normal.  No bruits.  Lungs: clear to auscultation. No rales no wheezes.  Heart: Regular rate and rhythm. Normal S1, S2. No S3.   No significant murmurs. PMI not displaced.  Abdomen:  Supple, nontender. Normal bowel sounds. No masses. No hepatomegaly.  Extremities:   Good distal pulses throughout. Tr to 1+ lower extremity edema.  Musculoskeletal :moving all extremities.  Neuro:   alert and oriented x3.  CN II-XII grossly intact.  EKG  SR 70  First degee AVblock  PR 230 msec.  RBBB.  LAFB Assessment and Plan:  1.  Acute on chronic systolic CHF  Volume appears to be very mildly increased Need to check labs before doing anythingg  Get on regimen, Will be in touch with patient re adjustments  F/U appt made for 6 wks.

## 2013-02-22 NOTE — Patient Instructions (Addendum)
Your physician recommends that you schedule a follow-up appointment in: 6 weeks  Your physician recommends that you return for lab work in: TODAY (SLIPS GIVEN FOR BNP.BMET)

## 2013-02-22 NOTE — Telephone Encounter (Signed)
.  left message to have patient return my call to advise per protocol we only fill cardiac medications in this office, to please contact your PCP to advise the refill is needed

## 2013-02-23 LAB — BASIC METABOLIC PANEL
BUN: 10 mg/dL (ref 6–23)
Calcium: 9.3 mg/dL (ref 8.4–10.5)
Glucose, Bld: 96 mg/dL (ref 70–99)
Potassium: 3.9 mEq/L (ref 3.5–5.3)
Sodium: 135 mEq/L (ref 135–145)

## 2013-02-23 LAB — BRAIN NATRIURETIC PEPTIDE: Brain Natriuretic Peptide: 1485.3 pg/mL — ABNORMAL HIGH (ref 0.0–100.0)

## 2013-02-23 NOTE — Telephone Encounter (Signed)
Spoke to pt to advise results/instructions. Pt understood. Pt advised that he did contact PCP about RX

## 2013-02-24 ENCOUNTER — Encounter: Payer: Self-pay | Admitting: *Deleted

## 2013-02-24 MED ORDER — POTASSIUM CHLORIDE CRYS ER 20 MEQ PO TBCR: 20.0000 meq | EXTENDED_RELEASE_TABLET | Freq: Every day | ORAL | Status: DC

## 2013-02-24 MED ORDER — FUROSEMIDE 40 MG PO TABS: 40.0000 mg | ORAL_TABLET | Freq: Every day | ORAL | Status: DC

## 2013-02-24 NOTE — Addendum Note (Signed)
Addended by: Derry Lory A on: 02/24/2013 05:12 PM   Modules accepted: Orders

## 2013-02-27 ENCOUNTER — Other Ambulatory Visit: Payer: Self-pay | Admitting: Cardiology

## 2013-03-02 LAB — BASIC METABOLIC PANEL
CO2: 31 mEq/L (ref 19–32)
Calcium: 9.1 mg/dL (ref 8.4–10.5)
Chloride: 96 mEq/L (ref 96–112)
Creat: 0.78 mg/dL (ref 0.50–1.35)
Sodium: 133 mEq/L — ABNORMAL LOW (ref 135–145)

## 2013-03-03 ENCOUNTER — Ambulatory Visit: Payer: Medicare Other | Admitting: Cardiovascular Disease

## 2013-03-05 ENCOUNTER — Other Ambulatory Visit: Payer: Self-pay | Admitting: Cardiology

## 2013-03-08 ENCOUNTER — Encounter: Payer: Self-pay | Admitting: Adult Health

## 2013-03-08 ENCOUNTER — Ambulatory Visit (INDEPENDENT_AMBULATORY_CARE_PROVIDER_SITE_OTHER): Payer: Medicare Other | Admitting: Adult Health

## 2013-03-08 VITALS — BP 109/74 | HR 70 | Ht 70.5 in | Wt 172.2 lb

## 2013-03-08 DIAGNOSIS — I428 Other cardiomyopathies: Secondary | ICD-10-CM

## 2013-03-08 DIAGNOSIS — I10 Essential (primary) hypertension: Secondary | ICD-10-CM

## 2013-03-08 DIAGNOSIS — I429 Cardiomyopathy, unspecified: Secondary | ICD-10-CM

## 2013-03-08 DIAGNOSIS — I2589 Other forms of chronic ischemic heart disease: Secondary | ICD-10-CM

## 2013-03-08 DIAGNOSIS — J8410 Pulmonary fibrosis, unspecified: Secondary | ICD-10-CM

## 2013-03-08 MED ORDER — SPIRONOLACTONE 25 MG PO TABS: 12.5000 mg | ORAL_TABLET | Freq: Every day | ORAL | Status: DC

## 2013-03-08 NOTE — Patient Instructions (Addendum)
Your physician recommends that you schedule a follow-up appointment in: 2 weeks with Lorin Picket NP  Your physician recommends that you return for lab work in: ONE WEEK FOR A BMET ( SLIPS GIVEN) YOU DO NOT NEED TO BE FASTING   Your physician has requested that you have an echocardiogram. Echocardiography is a painless test that uses sound waves to create images of your heart. It provides your doctor with information about the size and shape of your heart and how well your heart's chambers and valves are working. This procedure takes approximately one hour. There are no restrictions for this procedure.  WE WILL CALL YOU WITH YOUR TEST RESULTS/INSTRUCTIONS/NEXT STEPS ONCE RECEIVED BY THE PROVIDER   Your physician has recommended you make the following change in your medication:   1) START TAKING SPIRONOLACTONE 12.5 MG ONCE DAILY 2) START TAKING COREG  6.25MG  TWICE DAILY

## 2013-03-08 NOTE — Progress Notes (Signed)
HPI: [name] is a 77 year old patient of Dr. 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 The patient was last seen by Dr. Tenny Craw on 02/22/2013. At that time the patient is continued on current medication regimen, with followup labs to be checked before making any adjustments in his medicines. He was due to followup in 6 weeks but was seen by his primary care physician Dr. Margo Aye, he fell he should be seen sooner due to his ongoing edema. The patient admits to some dyspnea on exertion and PND.    Labs completed on 03/01/2013 demonstrated sodium of 133, potassium 4.4, chloride 96, CO2 31, BUN 14 creatinine 0.78. His BNP was 1209. He comes today 7 pounds heavier than on last visit with some lower extremity edema noted in the ankles and pretibially although this is less apparent. Continues to have some mild dyspnea on exertion. He states that his Lasix was recently increased to 60 mg daily from 40 mg daily, and he has not had any significant diuresis from this.     No Known Allergies  Current Outpatient Prescriptions  Medication Sig Dispense Refill  . albuterol (PROVENTIL HFA) 108 (90 BASE) MCG/ACT inhaler Inhale 2 puffs into the lungs every 6 (six) hours as needed.        . ALPRAZolam (XANAX) 0.5 MG tablet Take 0.5 mg by mouth at bedtime as needed for sleep.      Marland Kitchen aspirin 81 MG tablet Take 81 mg by mouth daily.        . carvedilol (COREG) 6.25 MG tablet TAKE 1 TABLET TWICE A DAY WITH MEAL  60 tablet  6  . furosemide (LASIX) 40 MG tablet Take 1 tablet (40 mg total) by mouth daily.  30 tablet  6  . lisinopril (PRINIVIL,ZESTRIL) 20 MG tablet Take 1 tablet (20 mg total) by mouth daily.  30 tablet  3  . potassium chloride SA (KLOR-CON M20) 20 MEQ tablet Take 1 tablet (20 mEq total) by mouth daily.  30 tablet  6  . sertraline (ZOLOFT) 50 MG  tablet Take 50 mg by mouth daily.      Marland Kitchen zolpidem (AMBIEN) 10 MG tablet Take 10 mg by mouth at bedtime as needed for sleep.      Marland Kitchen spironolactone (ALDACTONE) 25 MG tablet Take 0.5 tablets (12.5 mg total) by mouth daily.  90 tablet  1   No current facility-administered medications for this visit.    Past Medical History  Diagnosis Date  . Cardiomyopathy 1.13.2011    Acute systolic CHF at presentation; 05/6107 cardiac cath- 06/01/09  non obst coronary artery disease with EF 25%  . Hypertension   . Pulmonary fibrosis     12/2004 with basilar fibrotic changes;  PFT's 06/07/09 FEV1 1.98 (68%) raio 53 no better after B2,  DLC0105%  . Allergic rhinitis   . Osteoarthritis   . Tobacco abuse     100 pack years; quit    Past Surgical History  Procedure Laterality Date  . Inguinal hernia repair    . Rotator cuff repair    . Hand surgery      UEA:VWUJWJ of systems complete and found to be negative unless listed above  PHYSICAL EXAM BP 109/74  Pulse 70  Ht 5' 10.5" (1.791 m)  Wt 172 lb 4 oz (78.132 kg)  BMI 24.36 kg/m2  General: Well  developed, well nourished, in no acute distress Head: Eyes PERRLA, No xanthomas. Skin discoloration of the face.   Normal cephalic and atramatic  Lungs: Mild bibasilar crackles are noted. Heart: HRRR S1 S2, distant without MRG.  Pulses are 2+ & equal.            No carotid bruit. No JVD.  No abdominal bruits. No femoral bruits. Abdomen: Bowel sounds are positive, abdomen soft and non-tender without masses or                  Hernia's noted. Msk:  Back normal, normal gait. Normal strength and tone for age. Extremities: No clubbing, cyanosis or edema.  DP +1 Neuro: Alert and oriented X 3. Psych:  Good affect, responds appropriately   ASSESSMENT AND PLAN

## 2013-03-08 NOTE — Progress Notes (Deleted)
Name: Chad Rogers    DOB: 1936/02/22  Age: 77 y.o.  MR#: [id number]       PCP:  Catalina Pizza, MD      Insurance: Payor: [employer] MEDICARE / Plan: AARP MEDICARE COMPLETE / Product Type: *No Product type* /   CC:   No chief complaint on file.   VS Filed Vitals:   03/08/13 1453  BP: 109/74  Pulse: 70  Height: 5' 10.5" (1.791 m)  Weight: 172 lb 4 oz (78.132 kg)    Weights Current Weight  03/08/13 172 lb 4 oz (78.132 kg)  09/10/11 165 lb (74.844 kg)  08/20/11 170 lb (77.111 kg)    Blood Pressure  BP Readings from Last 3 Encounters:  03/08/13 109/74  02/22/13 107/73  09/10/11 97/64     Admit date:  (Not on file) Last encounter with RMR:  Visit date not found   Allergy Review of patient's allergies indicates no known allergies.  Current Outpatient Prescriptions  Medication Sig Dispense Refill  . albuterol (PROVENTIL HFA) 108 (90 BASE) MCG/ACT inhaler Inhale 2 puffs into the lungs every 6 (six) hours as needed.        . ALPRAZolam (XANAX) 0.5 MG tablet Take 0.5 mg by mouth at bedtime as needed for sleep.      Marland Kitchen aspirin 81 MG tablet Take 81 mg by mouth daily.        . carvedilol (COREG) 6.25 MG tablet       . carvedilol (COREG) 6.25 MG tablet TAKE 1 TABLET TWICE A DAY WITH MEAL  60 tablet  6  . furosemide (LASIX) 40 MG tablet Take 1 tablet (40 mg total) by mouth daily.  30 tablet  6  . lisinopril (PRINIVIL,ZESTRIL) 20 MG tablet Take 1 tablet (20 mg total) by mouth daily.  30 tablet  3  . potassium chloride SA (KLOR-CON M20) 20 MEQ tablet Take 1 tablet (20 mEq total) by mouth daily.  30 tablet  6  . sertraline (ZOLOFT) 50 MG tablet Take 50 mg by mouth daily.      Marland Kitchen zolpidem (AMBIEN) 10 MG tablet Take 10 mg by mouth at bedtime as needed for sleep.       No current facility-administered medications for this visit.    Discontinued Meds:    Medications Discontinued During This Encounter  Medication Reason  . albuterol-ipratropium (COMBIVENT) 18-103 MCG/ACT inhaler  Error    Patient Active Problem List   Diagnosis Date Noted  . Cardiomyopathy   . Hypertension   . Pulmonary fibrosis   . Osteoarthritis   . Tobacco abuse     LABS    Component Value Date/Time   NA 133* 03/01/2013 1156   NA 135 02/22/2013 1412   NA 133* 09/05/2011 1125   K 4.4 03/01/2013 1156   K 3.9 02/22/2013 1412   K 5.0 09/05/2011 1125   CL 96 03/01/2013 1156   CL 97 02/22/2013 1412   CL 97 09/05/2011 1125   CO2 31 03/01/2013 1156   CO2 30 02/22/2013 1412   CO2 31 09/05/2011 1125   GLUCOSE 90 03/01/2013 1156   GLUCOSE 96 02/22/2013 1412   GLUCOSE 98 09/05/2011 1125   BUN 14 03/01/2013 1156   BUN 10 02/22/2013 1412   BUN 14 09/05/2011 1125   CREATININE 0.78 03/01/2013 1156   CREATININE 0.75 02/22/2013 1412   CREATININE 0.92 09/05/2011 1125   CREATININE 0.9 04/10/2010 1140   CREATININE 0.9 08/29/2009 1027   CREATININE 1.1 05/25/2009  0914   CALCIUM 9.1 03/01/2013 1156   CALCIUM 9.3 02/22/2013 1412   CALCIUM 9.9 09/05/2011 1125   GFRNONAA 90.44 04/10/2010 1140   GFRNONAA 88.28 08/29/2009 1027   GFRNONAA 70.08 05/25/2009 0914   CMP     Component Value Date/Time   NA 133* 03/01/2013 1156   K 4.4 03/01/2013 1156   CL 96 03/01/2013 1156   CO2 31 03/01/2013 1156   GLUCOSE 90 03/01/2013 1156   BUN 14 03/01/2013 1156   CREATININE 0.78 03/01/2013 1156   CREATININE 0.9 04/10/2010 1140   CALCIUM 9.1 03/01/2013 1156   PROT 6.6 09/05/2011 1125   ALBUMIN 4.5 09/05/2011 1125   AST 19 09/05/2011 1125   ALT 24 09/05/2011 1125   ALKPHOS 55 09/05/2011 1125   BILITOT 0.6 09/05/2011 1125   GFRNONAA 90.44 04/10/2010 1140       Component Value Date/Time   WBC 11.2* 09/05/2011 1125   WBC 10.0 05/25/2009 0914   WBC 8.3 05/15/2009 1411   HGB 16.2 09/05/2011 1125   HGB 17.4* 05/25/2009 0914   HGB 18.1 H g/dL* 16/02/9603 5409   HCT 49.2 09/05/2011 1125   HCT 52.4* 05/25/2009 0914   HCT 53.4* 05/15/2009 1411   MCV 106.0* 09/05/2011 1125   MCV 105.9* 05/25/2009 0914   MCV 106.6* 05/15/2009 1411    Lipid  Panel     Component Value Date/Time   CHOL 152 09/05/2011 1125   TRIG 70 09/05/2011 1125   HDL 52 09/05/2011 1125   CHOLHDL 2.9 09/05/2011 1125   VLDL 14 09/05/2011 1125   LDLCALC 86 09/05/2011 1125    ABG    Component Value Date/Time   PHART 7.443 09/05/2011 1033   PCO2ART 41.4 09/05/2011 1033   PO2ART 65.6* 09/05/2011 1033   HCO3 27.8* 09/05/2011 1033   TCO2 23.6 09/05/2011 1033   O2SAT 93.4 09/05/2011 1033     Lab Results  Component Value Date   TSH 0.911 09/05/2011   BNP (last 3 results) No results found for this basename: PROBNP,  in the last 8760 hours Cardiac Panel (last 3 results) No results found for this basename: CKTOTAL, CKMB, TROPONINI, RELINDX,  in the last 72 hours  Iron/TIBC/Ferritin    Component Value Date/Time   FERRITIN 135.9 05/15/2009 1411     EKG Orders placed in visit on 02/22/13  . EKG 12-LEAD     Prior Assessment and Plan Problem List as of 03/08/2013   Cardiomyopathy   Last Assessment & Plan   09/10/2011 Office Visit Edited 09/13/2011  3:57 PM by Kathlen Brunswick, MD     Repeat echo demonstrated EF of 20%, severe global hypokinesis,  We will need to increase medications slowly for optimal dosing. However, he has had frequent diarrhea will a 16 lb wt loss since last visit.  I will decrease his lasix to 20 mg daily until etiology of loose stools is addressed to avoid dehydration. The patient refuses repeat labs for potassium and kidney fx as he will be seeing Dr Margo Aye.  BP is low normal. He is a candidate for ICD insertion once he is over his GI illness and on optimal medical therapy. He will see Dr. Dietrich Pates in 3 months.    Hypertension   Last Assessment & Plan   09/10/2011 Office Visit Written 09/10/2011  5:11 PM by Jodelle Gross, NP     Low normal with increased dose of coreg and frequent loose stools, probably related to mild dehydration. Lasix has been decreased to  20 mg daily until diarrhea has been addressed.    Pulmonary fibrosis   Last  Assessment & Plan   09/10/2011 Office Visit Edited 09/13/2011  3:58 PM by Kathlen Brunswick, MD     PFT's demonstrate clinical diagnosis of COPD.  Spirometry shows moderate-to-severe ventilatory defect with airflow obstruction. Lung volumes showed marked air trapping. DLCO is moderately reduced. Airway resistance is high, confirming airflow obstruction. There is significant bronchodilator improvement.    CXR showed emphysematous and chronic bronchitic changes consistent with COPD. No acute abnormalities.   Proventil will be changed to Combivent inhaler with spacer.    Osteoarthritis   Tobacco abuse   Last Assessment & Plan   08/20/2011 Office Visit Written 08/20/2011  2:51 PM by Kathlen Brunswick, MD     I am thrilled to support patient's request for assistance with discontinuation of tobacco use.  A course of Chantix was prescribed and appropriate instructions given.        Imaging: No results found.

## 2013-03-08 NOTE — Assessment & Plan Note (Signed)
Most recent echocardiogram dated April of 2014 demonstrated severe systolic dysfunction with an EF of 20%. Patient is currently on Lasix 60 mg daily along with ACE inhibitor, and carvedilol 6.25 mg. He is not taking that carvedilol twice a day as directed. I will increase carvedilol to 6.25 mg twice a day. I will at 12.5 mg of spironolactone to his regimen daily. I will recheck a BMET in one week. If he is not receiving any benefit from Lasix, may need to consider changing to torsemide as this has better bioavailability. I repeating his echocardiogram for comparison of LV function in April as it has not been recently checked. If LV systolic function remains diminished, may need to consider AICD pacemaker. I will see him in 2 weeks.

## 2013-03-08 NOTE — Assessment & Plan Note (Signed)
Current blood pressure is low normal for most recent evaluation of LV function with severe solids dysfunction. We will continue to monitor this closely with followup in 2 weeks with medication adjustments.

## 2013-03-08 NOTE — Assessment & Plan Note (Signed)
He continues to have dyspnea on exertion which I am not sure is entirely related to his systolic dysfunction. He has known history of COPD. We will followup closely for ongoing management.

## 2013-03-12 ENCOUNTER — Ambulatory Visit (HOSPITAL_COMMUNITY)
Admission: RE | Admit: 2013-03-12 | Discharge: 2013-03-12 | Disposition: A | Discharge status: Home / Self Care | Payer: Medicare Other | Source: Ambulatory Visit | Attending: Adult Health | Admitting: Adult Health

## 2013-03-12 DIAGNOSIS — J4489 Other specified chronic obstructive pulmonary disease: Secondary | ICD-10-CM | POA: Insufficient documentation

## 2013-03-12 DIAGNOSIS — J449 Chronic obstructive pulmonary disease, unspecified: Secondary | ICD-10-CM | POA: Insufficient documentation

## 2013-03-12 DIAGNOSIS — I428 Other cardiomyopathies: Secondary | ICD-10-CM | POA: Insufficient documentation

## 2013-03-12 DIAGNOSIS — I2589 Other forms of chronic ischemic heart disease: Secondary | ICD-10-CM

## 2013-03-12 DIAGNOSIS — R0989 Other specified symptoms and signs involving the circulatory and respiratory systems: Secondary | ICD-10-CM | POA: Insufficient documentation

## 2013-03-12 DIAGNOSIS — I059 Rheumatic mitral valve disease, unspecified: Secondary | ICD-10-CM

## 2013-03-12 DIAGNOSIS — R0609 Other forms of dyspnea: Secondary | ICD-10-CM | POA: Insufficient documentation

## 2013-03-12 DIAGNOSIS — Z87891 Personal history of nicotine dependence: Secondary | ICD-10-CM | POA: Insufficient documentation

## 2013-03-12 DIAGNOSIS — I10 Essential (primary) hypertension: Secondary | ICD-10-CM

## 2013-03-12 NOTE — Progress Notes (Signed)
*  PRELIMINARY RESULTS* Echocardiogram 2D Echocardiogram has been performed.  Chad Rogers 03/12/2013, 2:54 PM

## 2013-03-15 LAB — BASIC METABOLIC PANEL
BUN: 13 mg/dL (ref 6–23)
Chloride: 93 mEq/L — ABNORMAL LOW (ref 96–112)
Potassium: 4.6 mEq/L (ref 3.5–5.3)
Sodium: 129 mEq/L — ABNORMAL LOW (ref 135–145)

## 2013-03-16 NOTE — Addendum Note (Signed)
Addended by: Thompson Grayer on: 03/16/2013 05:06 PM   Modules accepted: Orders

## 2013-03-22 ENCOUNTER — Ambulatory Visit (INDEPENDENT_AMBULATORY_CARE_PROVIDER_SITE_OTHER): Payer: Medicare Other | Admitting: Adult Health

## 2013-03-22 ENCOUNTER — Encounter: Payer: Self-pay | Admitting: Adult Health

## 2013-03-22 VITALS — BP 116/74 | HR 72 | Ht 71.0 in | Wt 169.0 lb

## 2013-03-22 DIAGNOSIS — I10 Essential (primary) hypertension: Secondary | ICD-10-CM

## 2013-03-22 DIAGNOSIS — F17200 Nicotine dependence, unspecified, uncomplicated: Secondary | ICD-10-CM

## 2013-03-22 DIAGNOSIS — I429 Cardiomyopathy, unspecified: Secondary | ICD-10-CM

## 2013-03-22 DIAGNOSIS — I428 Other cardiomyopathies: Secondary | ICD-10-CM

## 2013-03-22 DIAGNOSIS — Z720 Tobacco use: Secondary | ICD-10-CM

## 2013-03-22 NOTE — Assessment & Plan Note (Signed)
Continue cessation.

## 2013-03-22 NOTE — Patient Instructions (Addendum)
Your physician recommends that you schedule a follow-up appointment in: 1 months  Keep weight 167 or less.  The office will give you a call and let you know when to come back to put life vest on.

## 2013-03-22 NOTE — Assessment & Plan Note (Signed)
Echo demonstrates lower EF of 15% compared to prior echo at 25 %. He will continue on ACE, Coreg, and lasix. Will need to have Life Vest placed for the next 3 months while waiting on repeat echo. He is to continue to weigh daily and avoid salted foods. I will recheck his echo in 3 months on optimal medical therapy. He has been taken off of spironolactone in the setting of hyponatremia.

## 2013-03-22 NOTE — Assessment & Plan Note (Signed)
Low normal today, but optimal for patient with systolic dysfunction.

## 2013-03-22 NOTE — Addendum Note (Signed)
Addended by: Thompson Grayer on: 03/22/2013 02:11 PM   Modules accepted: Orders

## 2013-03-22 NOTE — Progress Notes (Signed)
HPI: [name] is a 77 year old patient of Dr. Dietrich Pates we are following for ongoing assessment and management of nonischemic cardiomyopathy, EF of 20%, COPD and history of lower extremity edema. The patient was last seen on 03/08/2013 with ongoing complaints of edema. And some dyspnea on exertion. Review of medications at that time demonstrated he was not taking carvedilol as directed, only once a day. I increased his carvedilol to 6.25 mg twice a day. I added 12.5 milligrams of spironolactone. Echocardiogram was completed for comparison was a BMET ordered.  Followup echocardiogram completed on and 24 2014 demonstrated LV systolic function was severely reduced with an EF of 15-20%, with severe global hypokinesis seen. The Doppler parameters were consistent with strict to pathology indicative of decreased left ventricular diastolic compliance and/or left atrial pressure. The aortic valve is mildly calcified. There was no stenosis. Mitral valve was calcified it the annulus with mild regurg.   Labs were completed revealing sodium 129 potassium 4.6 chloride 93 CO2 29 BUN 13 creatinine 0.82 with a calcium of 9.6 and a glucose of 109.                    No Known Allergies  Current Outpatient Prescriptions  Medication Sig Dispense Refill  . albuterol (PROVENTIL HFA) 108 (90 BASE) MCG/ACT inhaler Inhale 2 puffs into the lungs every 6 (six) hours as needed.        . ALPRAZolam (XANAX) 0.5 MG tablet Take 0.5 mg by mouth at bedtime as needed for sleep.      Marland Kitchen aspirin 81 MG tablet Take 81 mg by mouth daily.        . carvedilol (COREG) 6.25 MG tablet TAKE 1 TABLET TWICE A DAY WITH MEAL  60 tablet  6  . furosemide (LASIX) 40 MG tablet Take 1 tablet (40 mg total) by mouth daily.  30 tablet  6  . lisinopril (PRINIVIL,ZESTRIL) 20 MG tablet Take 1 tablet (20 mg total) by mouth daily.  30 tablet  3  . potassium chloride SA (KLOR-CON M20) 20 MEQ tablet Take 1 tablet (20 mEq total) by mouth daily.  30  tablet  6  . sertraline (ZOLOFT) 50 MG tablet Take 50 mg by mouth daily.      Marland Kitchen spironolactone (ALDACTONE) 25 MG tablet Take 0.5 tablets (12.5 mg total) by mouth daily.  90 tablet  1  . zolpidem (AMBIEN) 10 MG tablet Take 10 mg by mouth at bedtime as needed for sleep.       No current facility-administered medications for this visit.    Past Medical History  Diagnosis Date  . Cardiomyopathy 1.13.2011    Acute systolic CHF at presentation; 05/6107 cardiac cath- 06/01/09  non obst coronary artery disease with EF 25%  . Hypertension   . Pulmonary fibrosis     12/2004 with basilar fibrotic changes;  PFT's 06/07/09 FEV1 1.98 (68%) raio 53 no better after B2,  DLC0105%  . Allergic rhinitis   . Osteoarthritis   . Tobacco abuse     100 pack years; quit    Past Surgical History  Procedure Laterality Date  . Inguinal hernia repair    . Rotator cuff repair    . Hand surgery      ROS: Review of systems complete and found to be negative unless listed above  PHYSICAL EXAM BP 116/74  Pulse 72  Ht 5\' 11"  (1.803 m)  Wt 169 lb (76.658 kg)  BMI 23.58 kg/m2  General:  Well developed, well nourished, in no acute distress Head: Eyes PERRLA, No xanthomas.   Normal cephalic and atramatic, facial burns and discoloration.  Lungs: Clear bilaterally to auscultation with mild crackles in the bases. No wheezes or coughing. Heart: HRRR S1 S2, with 1/6 systolic murmur.   Pulses are 2+ & equal.            No carotid bruit. No JVD.  No abdominal bruits. No femoral bruits. Abdomen: Bowel sounds are positive, abdomen soft and non-tender without masses or                  Hernia's noted. Msk:  Back normal, normal gait. Normal strength and tone for age. Extremities: No clubbing, cyanosis or edema. Cool feet.  DP +1 Neuro: Alert and oriented X 3. Psych:  Good affect, responds appropriately    ASSESSMENT AND PLAN

## 2013-05-06 ENCOUNTER — Other Ambulatory Visit: Payer: Self-pay

## 2013-05-06 ENCOUNTER — Telehealth: Payer: Self-pay | Admitting: Adult Health

## 2013-05-06 DIAGNOSIS — R0602 Shortness of breath: Secondary | ICD-10-CM

## 2013-05-06 DIAGNOSIS — I10 Essential (primary) hypertension: Secondary | ICD-10-CM

## 2013-05-06 MED ORDER — POTASSIUM CHLORIDE CRYS ER 20 MEQ PO TBCR: 20.0000 meq | EXTENDED_RELEASE_TABLET | Freq: Every day | ORAL | Status: DC

## 2013-05-06 NOTE — Telephone Encounter (Signed)
Patient needs Klor-con sent to [employer] / tgs

## 2013-05-21 ENCOUNTER — Telehealth: Payer: Self-pay

## 2013-05-21 NOTE — Telephone Encounter (Signed)
Patient called and was upset over price of Life Vest.  He states he was charged 3370.00 and his responsibility is 540.99 and he can't afford this.  Urged patient to contact [place] about bill.  Patient said he did not want Life Vest if this is cost.

## 2013-05-21 NOTE — Telephone Encounter (Signed)
I  spoke with patient regarding financial burden to use "Life Vest". His cost not covered by insurance is over $500/monthly. He states that  both his secondary insurance company and [employer] offer no monetary relief. He said he drove to [facility] office, had vest placed on him  and signed papers acknowledging receipt of vest. He plans to return vest at [facility] office on Monday 05/24/13 I was unable to find any apt documentation of this transaction Will forward to [facility] triage

## 2013-06-04 ENCOUNTER — Telehealth: Payer: Self-pay | Admitting: Internal Medicine

## 2013-06-04 DIAGNOSIS — R0602 Shortness of breath: Secondary | ICD-10-CM

## 2013-06-04 DIAGNOSIS — I10 Essential (primary) hypertension: Secondary | ICD-10-CM

## 2013-06-04 MED ORDER — LISINOPRIL 20 MG PO TABS: 20.0000 mg | ORAL_TABLET | Freq: Every day | ORAL | Status: DC

## 2013-06-04 MED ORDER — FUROSEMIDE 40 MG PO TABS: 40.0000 mg | ORAL_TABLET | Freq: Every day | ORAL | Status: DC

## 2013-06-04 MED ORDER — CARVEDILOL 6.25 MG PO TABS: 6.2500 mg | ORAL_TABLET | Freq: Two times a day (BID) | ORAL | Status: DC

## 2013-06-04 MED ORDER — POTASSIUM CHLORIDE CRYS ER 20 MEQ PO TBCR: 20.0000 meq | EXTENDED_RELEASE_TABLET | Freq: Every day | ORAL | Status: DC

## 2013-06-04 NOTE — Telephone Encounter (Signed)
rx sent to pharmacy by e-script To [facility] for carvedilol,lasix,potassium and lisinopril

## 2013-06-04 NOTE — Telephone Encounter (Signed)
Please see paper refill in bin / tgs  °

## 2013-06-11 ENCOUNTER — Telehealth: Payer: Self-pay | Admitting: *Deleted

## 2013-06-11 NOTE — Telephone Encounter (Signed)
Pt is not going to have this done he states that he is still paying on the life vest and will not schedule anything else until he gets that paid off.  Got a staff message to schedule 2-d echo on patient

## 2013-06-11 NOTE — Telephone Encounter (Signed)
Patient declined to schedule follow up echocardiogram for February because of inability to pay. K.Lawrence NP aware

## 2013-06-15 ENCOUNTER — Other Ambulatory Visit (HOSPITAL_COMMUNITY): Payer: Medicare Other

## 2013-07-15 ENCOUNTER — Ambulatory Visit: Payer: Medicare Other | Admitting: Adult Health

## 2013-10-02 ENCOUNTER — Other Ambulatory Visit: Payer: Self-pay | Admitting: Internal Medicine

## 2013-10-15 ENCOUNTER — Encounter: Payer: Self-pay | Admitting: *Deleted

## 2014-02-09 ENCOUNTER — Encounter: Payer: Self-pay | Admitting: *Deleted

## 2014-08-23 ENCOUNTER — Other Ambulatory Visit: Payer: Self-pay | Admitting: Internal Medicine

## 2014-11-01 ENCOUNTER — Other Ambulatory Visit: Payer: Self-pay | Admitting: Internal Medicine

## 2014-11-01 NOTE — Telephone Encounter (Signed)
Refill complete 

## 2014-12-02 ENCOUNTER — Other Ambulatory Visit: Payer: Self-pay | Admitting: Adult Health

## 2014-12-08 ENCOUNTER — Telehealth: Payer: Self-pay | Admitting: *Deleted

## 2014-12-08 ENCOUNTER — Encounter: Payer: Self-pay | Admitting: *Deleted

## 2014-12-08 NOTE — Telephone Encounter (Signed)
-----   Message from Orinda Kenner sent at 12/08/2014 10:18 AM EDT ----- Regarding: RE: patient needs appt  Patient stated he did not want to schedule follow up.  terry ----- Message -----    From: Levonne Hubert, LPN    Sent: 11/15/6379  11:04 AM      To: Manson Passey Subject: patient needs appt                             Patient needs appt for further refills

## 2014-12-08 NOTE — Telephone Encounter (Signed)
Mailed letter to pt stating that we will no longer refill his medications until he is seen in Cardiology.

## 2015-01-07 ENCOUNTER — Other Ambulatory Visit: Payer: Self-pay | Admitting: Adult Health

## 2015-01-17 ENCOUNTER — Other Ambulatory Visit: Payer: Self-pay | Admitting: Adult Health

## 2015-02-21 ENCOUNTER — Other Ambulatory Visit: Payer: Self-pay | Admitting: Adult Health

## 2015-10-10 ENCOUNTER — Other Ambulatory Visit: Payer: Self-pay | Admitting: Adult Health

## 2015-11-07 DIAGNOSIS — F5101 Primary insomnia: Secondary | ICD-10-CM | POA: Diagnosis not present

## 2015-11-07 DIAGNOSIS — Z9119 Patient's noncompliance with other medical treatment and regimen: Secondary | ICD-10-CM | POA: Diagnosis not present

## 2015-12-05 DIAGNOSIS — I10 Essential (primary) hypertension: Secondary | ICD-10-CM | POA: Diagnosis not present

## 2015-12-07 DIAGNOSIS — L409 Psoriasis, unspecified: Secondary | ICD-10-CM | POA: Diagnosis not present

## 2015-12-07 DIAGNOSIS — I739 Peripheral vascular disease, unspecified: Secondary | ICD-10-CM | POA: Diagnosis not present

## 2015-12-07 DIAGNOSIS — J449 Chronic obstructive pulmonary disease, unspecified: Secondary | ICD-10-CM | POA: Diagnosis not present

## 2015-12-07 DIAGNOSIS — Z0000 Encounter for general adult medical examination without abnormal findings: Secondary | ICD-10-CM | POA: Diagnosis not present

## 2015-12-07 DIAGNOSIS — E871 Hypo-osmolality and hyponatremia: Secondary | ICD-10-CM | POA: Diagnosis not present

## 2015-12-15 ENCOUNTER — Other Ambulatory Visit (HOSPITAL_COMMUNITY): Payer: Self-pay | Admitting: Internal Medicine

## 2015-12-28 ENCOUNTER — Inpatient Hospital Stay (HOSPITAL_COMMUNITY)
Admission: EM | Admit: 2015-12-28 | Discharge: 2016-01-19 | DRG: 004 | Disposition: A | Discharge status: Rehabilitation Facility | Payer: Medicare Other | Attending: General Surgery | Admitting: General Surgery

## 2015-12-28 ENCOUNTER — Encounter (HOSPITAL_COMMUNITY): Payer: Self-pay | Admitting: Emergency Medicine

## 2015-12-28 ENCOUNTER — Emergency Department (HOSPITAL_COMMUNITY): Payer: Medicare Other

## 2015-12-28 DIAGNOSIS — S270XXA Traumatic pneumothorax, initial encounter: Secondary | ICD-10-CM | POA: Diagnosis not present

## 2015-12-28 DIAGNOSIS — I4819 Other persistent atrial fibrillation: Secondary | ICD-10-CM

## 2015-12-28 DIAGNOSIS — Z9911 Dependence on respirator [ventilator] status: Secondary | ICD-10-CM

## 2015-12-28 DIAGNOSIS — L409 Psoriasis, unspecified: Secondary | ICD-10-CM | POA: Diagnosis present

## 2015-12-28 DIAGNOSIS — I5043 Acute on chronic combined systolic (congestive) and diastolic (congestive) heart failure: Secondary | ICD-10-CM | POA: Diagnosis not present

## 2015-12-28 DIAGNOSIS — R402253 Coma scale, best verbal response, oriented, at hospital admission: Secondary | ICD-10-CM | POA: Diagnosis present

## 2015-12-28 DIAGNOSIS — J9811 Atelectasis: Secondary | ICD-10-CM | POA: Diagnosis not present

## 2015-12-28 DIAGNOSIS — I5023 Acute on chronic systolic (congestive) heart failure: Secondary | ICD-10-CM | POA: Diagnosis not present

## 2015-12-28 DIAGNOSIS — W109XXA Fall (on) (from) unspecified stairs and steps, initial encounter: Secondary | ICD-10-CM | POA: Diagnosis present

## 2015-12-28 DIAGNOSIS — E876 Hypokalemia: Secondary | ICD-10-CM | POA: Diagnosis not present

## 2015-12-28 DIAGNOSIS — J440 Chronic obstructive pulmonary disease with acute lower respiratory infection: Secondary | ICD-10-CM | POA: Diagnosis not present

## 2015-12-28 DIAGNOSIS — S2249XA Multiple fractures of ribs, unspecified side, initial encounter for closed fracture: Secondary | ICD-10-CM | POA: Diagnosis present

## 2015-12-28 DIAGNOSIS — S32810A Multiple fractures of pelvis with stable disruption of pelvic ring, initial encounter for closed fracture: Secondary | ICD-10-CM | POA: Diagnosis present

## 2015-12-28 DIAGNOSIS — S32039A Unspecified fracture of third lumbar vertebra, initial encounter for closed fracture: Secondary | ICD-10-CM | POA: Diagnosis not present

## 2015-12-28 DIAGNOSIS — F22 Delusional disorders: Secondary | ICD-10-CM

## 2015-12-28 DIAGNOSIS — B963 Hemophilus influenzae [H. influenzae] as the cause of diseases classified elsewhere: Secondary | ICD-10-CM | POA: Diagnosis not present

## 2015-12-28 DIAGNOSIS — S2232XS Fracture of one rib, left side, sequela: Secondary | ICD-10-CM

## 2015-12-28 DIAGNOSIS — M1990 Unspecified osteoarthritis, unspecified site: Secondary | ICD-10-CM | POA: Diagnosis present

## 2015-12-28 DIAGNOSIS — E222 Syndrome of inappropriate secretion of antidiuretic hormone: Secondary | ICD-10-CM | POA: Diagnosis not present

## 2015-12-28 DIAGNOSIS — R570 Cardiogenic shock: Secondary | ICD-10-CM | POA: Diagnosis not present

## 2015-12-28 DIAGNOSIS — S2232XA Fracture of one rib, left side, initial encounter for closed fracture: Secondary | ICD-10-CM | POA: Diagnosis not present

## 2015-12-28 DIAGNOSIS — I255 Ischemic cardiomyopathy: Secondary | ICD-10-CM | POA: Diagnosis not present

## 2015-12-28 DIAGNOSIS — I110 Hypertensive heart disease with heart failure: Secondary | ICD-10-CM | POA: Diagnosis present

## 2015-12-28 DIAGNOSIS — S32412A Displaced fracture of anterior wall of left acetabulum, initial encounter for closed fracture: Secondary | ICD-10-CM

## 2015-12-28 DIAGNOSIS — J9690 Respiratory failure, unspecified, unspecified whether with hypoxia or hypercapnia: Secondary | ICD-10-CM | POA: Diagnosis not present

## 2015-12-28 DIAGNOSIS — Z430 Encounter for attention to tracheostomy: Secondary | ICD-10-CM

## 2015-12-28 DIAGNOSIS — D62 Acute posthemorrhagic anemia: Secondary | ICD-10-CM | POA: Diagnosis not present

## 2015-12-28 DIAGNOSIS — N179 Acute kidney failure, unspecified: Secondary | ICD-10-CM | POA: Diagnosis not present

## 2015-12-28 DIAGNOSIS — S299XXA Unspecified injury of thorax, initial encounter: Secondary | ICD-10-CM

## 2015-12-28 DIAGNOSIS — J189 Pneumonia, unspecified organism: Secondary | ICD-10-CM | POA: Diagnosis not present

## 2015-12-28 DIAGNOSIS — E872 Acidosis: Secondary | ICD-10-CM | POA: Diagnosis not present

## 2015-12-28 DIAGNOSIS — W108XXA Fall (on) (from) other stairs and steps, initial encounter: Secondary | ICD-10-CM

## 2015-12-28 DIAGNOSIS — M5489 Other dorsalgia: Secondary | ICD-10-CM | POA: Diagnosis not present

## 2015-12-28 DIAGNOSIS — B9620 Unspecified Escherichia coli [E. coli] as the cause of diseases classified elsewhere: Secondary | ICD-10-CM | POA: Diagnosis not present

## 2015-12-28 DIAGNOSIS — S32009A Unspecified fracture of unspecified lumbar vertebra, initial encounter for closed fracture: Secondary | ICD-10-CM

## 2015-12-28 DIAGNOSIS — Z4659 Encounter for fitting and adjustment of other gastrointestinal appliance and device: Secondary | ICD-10-CM

## 2015-12-28 DIAGNOSIS — J8410 Pulmonary fibrosis, unspecified: Secondary | ICD-10-CM | POA: Diagnosis not present

## 2015-12-28 DIAGNOSIS — I481 Persistent atrial fibrillation: Secondary | ICD-10-CM | POA: Diagnosis present

## 2015-12-28 DIAGNOSIS — S32049A Unspecified fracture of fourth lumbar vertebra, initial encounter for closed fracture: Secondary | ICD-10-CM | POA: Diagnosis present

## 2015-12-28 DIAGNOSIS — Z9689 Presence of other specified functional implants: Secondary | ICD-10-CM

## 2015-12-28 DIAGNOSIS — J9601 Acute respiratory failure with hypoxia: Secondary | ICD-10-CM | POA: Diagnosis not present

## 2015-12-28 DIAGNOSIS — I2510 Atherosclerotic heart disease of native coronary artery without angina pectoris: Secondary | ICD-10-CM | POA: Diagnosis present

## 2015-12-28 DIAGNOSIS — I429 Cardiomyopathy, unspecified: Secondary | ICD-10-CM

## 2015-12-28 DIAGNOSIS — R0602 Shortness of breath: Secondary | ICD-10-CM

## 2015-12-28 DIAGNOSIS — Z79899 Other long term (current) drug therapy: Secondary | ICD-10-CM

## 2015-12-28 DIAGNOSIS — E870 Hyperosmolality and hypernatremia: Secondary | ICD-10-CM | POA: Diagnosis not present

## 2015-12-28 DIAGNOSIS — Z6825 Body mass index (BMI) 25.0-25.9, adult: Secondary | ICD-10-CM

## 2015-12-28 DIAGNOSIS — S199XXA Unspecified injury of neck, initial encounter: Secondary | ICD-10-CM | POA: Diagnosis not present

## 2015-12-28 DIAGNOSIS — Z7982 Long term (current) use of aspirin: Secondary | ICD-10-CM

## 2015-12-28 DIAGNOSIS — E861 Hypovolemia: Secondary | ICD-10-CM | POA: Diagnosis present

## 2015-12-28 DIAGNOSIS — R402143 Coma scale, eyes open, spontaneous, at hospital admission: Secondary | ICD-10-CM | POA: Diagnosis present

## 2015-12-28 DIAGNOSIS — I472 Ventricular tachycardia: Secondary | ICD-10-CM | POA: Diagnosis not present

## 2015-12-28 DIAGNOSIS — S2242XA Multiple fractures of ribs, left side, initial encounter for closed fracture: Secondary | ICD-10-CM | POA: Diagnosis not present

## 2015-12-28 DIAGNOSIS — S32435A Nondisplaced fracture of anterior column [iliopubic] of left acetabulum, initial encounter for closed fracture: Secondary | ICD-10-CM | POA: Diagnosis present

## 2015-12-28 DIAGNOSIS — I4729 Other ventricular tachycardia: Secondary | ICD-10-CM

## 2015-12-28 DIAGNOSIS — F17210 Nicotine dependence, cigarettes, uncomplicated: Secondary | ICD-10-CM | POA: Diagnosis present

## 2015-12-28 DIAGNOSIS — Z9889 Other specified postprocedural states: Secondary | ICD-10-CM

## 2015-12-28 DIAGNOSIS — R7989 Other specified abnormal findings of blood chemistry: Secondary | ICD-10-CM

## 2015-12-28 DIAGNOSIS — D6489 Other specified anemias: Secondary | ICD-10-CM | POA: Diagnosis present

## 2015-12-28 DIAGNOSIS — Y95 Nosocomial condition: Secondary | ICD-10-CM | POA: Diagnosis not present

## 2015-12-28 DIAGNOSIS — S32415A Nondisplaced fracture of anterior wall of left acetabulum, initial encounter for closed fracture: Secondary | ICD-10-CM | POA: Diagnosis not present

## 2015-12-28 DIAGNOSIS — E875 Hyperkalemia: Secondary | ICD-10-CM | POA: Diagnosis present

## 2015-12-28 DIAGNOSIS — S32402A Unspecified fracture of left acetabulum, initial encounter for closed fracture: Secondary | ICD-10-CM | POA: Diagnosis not present

## 2015-12-28 DIAGNOSIS — J95812 Postprocedural air leak: Secondary | ICD-10-CM | POA: Diagnosis not present

## 2015-12-28 DIAGNOSIS — I4891 Unspecified atrial fibrillation: Secondary | ICD-10-CM | POA: Diagnosis not present

## 2015-12-28 DIAGNOSIS — I998 Other disorder of circulatory system: Secondary | ICD-10-CM | POA: Diagnosis not present

## 2015-12-28 DIAGNOSIS — I5022 Chronic systolic (congestive) heart failure: Secondary | ICD-10-CM | POA: Diagnosis not present

## 2015-12-28 DIAGNOSIS — I4510 Unspecified right bundle-branch block: Secondary | ICD-10-CM | POA: Diagnosis not present

## 2015-12-28 DIAGNOSIS — S0990XA Unspecified injury of head, initial encounter: Secondary | ICD-10-CM | POA: Diagnosis not present

## 2015-12-28 DIAGNOSIS — R52 Pain, unspecified: Secondary | ICD-10-CM | POA: Diagnosis not present

## 2015-12-28 DIAGNOSIS — T85598A Other mechanical complication of other gastrointestinal prosthetic devices, implants and grafts, initial encounter: Secondary | ICD-10-CM

## 2015-12-28 DIAGNOSIS — S129XXA Fracture of neck, unspecified, initial encounter: Secondary | ICD-10-CM | POA: Diagnosis not present

## 2015-12-28 DIAGNOSIS — I739 Peripheral vascular disease, unspecified: Secondary | ICD-10-CM | POA: Diagnosis present

## 2015-12-28 DIAGNOSIS — R200 Anesthesia of skin: Secondary | ICD-10-CM | POA: Diagnosis not present

## 2015-12-28 DIAGNOSIS — R0989 Other specified symptoms and signs involving the circulatory and respiratory systems: Secondary | ICD-10-CM

## 2015-12-28 DIAGNOSIS — F1010 Alcohol abuse, uncomplicated: Secondary | ICD-10-CM | POA: Diagnosis present

## 2015-12-28 DIAGNOSIS — S2242XS Multiple fractures of ribs, left side, sequela: Secondary | ICD-10-CM

## 2015-12-28 DIAGNOSIS — R402363 Coma scale, best motor response, obeys commands, at hospital admission: Secondary | ICD-10-CM | POA: Diagnosis present

## 2015-12-28 DIAGNOSIS — I509 Heart failure, unspecified: Secondary | ICD-10-CM | POA: Diagnosis not present

## 2015-12-28 DIAGNOSIS — K560 Paralytic ileus: Secondary | ICD-10-CM

## 2015-12-28 DIAGNOSIS — J939 Pneumothorax, unspecified: Secondary | ICD-10-CM

## 2015-12-28 HISTORY — DX: Heart failure, unspecified: I50.9

## 2015-12-28 HISTORY — DX: Atherosclerotic heart disease of native coronary artery without angina pectoris: I25.10

## 2015-12-28 HISTORY — DX: Chronic obstructive pulmonary disease, unspecified: J44.9

## 2015-12-28 HISTORY — DX: Unspecified malignant neoplasm of skin, unspecified: C44.90

## 2015-12-28 HISTORY — DX: Alcohol abuse, uncomplicated: F10.10

## 2015-12-28 HISTORY — DX: Psoriasis, unspecified: L40.9

## 2015-12-28 LAB — CBC WITH DIFFERENTIAL/PLATELET
BASOS PCT: 0 %
Basophils Absolute: 0.1 10*3/uL (ref 0.0–0.1)
Eosinophils Absolute: 0.1 10*3/uL (ref 0.0–0.7)
Eosinophils Relative: 1 %
HEMATOCRIT: 43.8 % (ref 39.0–52.0)
HEMOGLOBIN: 15.3 g/dL (ref 13.0–17.0)
LYMPHS ABS: 2.8 10*3/uL (ref 0.7–4.0)
LYMPHS PCT: 17 %
MCH: 34.7 pg — ABNORMAL HIGH (ref 26.0–34.0)
MCHC: 34.9 g/dL (ref 30.0–36.0)
MCV: 99.3 fL (ref 78.0–100.0)
MONOS PCT: 5 %
Monocytes Absolute: 0.8 10*3/uL (ref 0.1–1.0)
NEUTROS ABS: 13 10*3/uL — AB (ref 1.7–7.7)
NEUTROS PCT: 77 %
Platelets: 202 10*3/uL (ref 150–400)
RBC: 4.41 MIL/uL (ref 4.22–5.81)
RDW: 13.6 % (ref 11.5–15.5)
WBC: 16.7 10*3/uL — ABNORMAL HIGH (ref 4.0–10.5)

## 2015-12-28 LAB — I-STAT CHEM 8, ED
BUN: 10 mg/dL (ref 6–20)
CALCIUM ION: 1.09 mmol/L — AB (ref 1.12–1.23)
CREATININE: 0.8 mg/dL (ref 0.61–1.24)
Chloride: 92 mmol/L — ABNORMAL LOW (ref 101–111)
GLUCOSE: 110 mg/dL — AB (ref 65–99)
HCT: 46 % (ref 39.0–52.0)
HEMOGLOBIN: 15.6 g/dL (ref 13.0–17.0)
Potassium: 4.4 mmol/L (ref 3.5–5.1)
Sodium: 128 mmol/L — ABNORMAL LOW (ref 135–145)
TCO2: 21 mmol/L (ref 0–100)

## 2015-12-28 LAB — I-STAT CG4 LACTIC ACID, ED: LACTIC ACID, VENOUS: 5.63 mmol/L — AB (ref 0.5–1.9)

## 2015-12-28 MED ORDER — TETANUS-DIPHTH-ACELL PERTUSSIS 5-2.5-18.5 LF-MCG/0.5 IM SUSP
0.5000 mL | Freq: Once | INTRAMUSCULAR | Status: AC
  Administered 2015-12-28: 0.5 mL via INTRAMUSCULAR
  Filled 2015-12-28: qty 0.5

## 2015-12-28 MED ORDER — SODIUM CHLORIDE 0.9 % IV BOLUS (SEPSIS)
1000.0000 mL | Freq: Once | INTRAVENOUS | Status: AC
  Administered 2015-12-28: 1000 mL via INTRAVENOUS

## 2015-12-28 MED ORDER — IOPAMIDOL (ISOVUE-300) INJECTION 61%
100.0000 mL | Freq: Once | INTRAVENOUS | Status: AC | PRN
  Administered 2015-12-29: 100 mL via INTRAVENOUS

## 2015-12-28 NOTE — ED Provider Notes (Addendum)
[facility] DEPT Provider Note   CSN: [id number] Arrival date & time: 12/28/15  2249  First Provider Contact:  First MD Initiated Contact with Patient 12/28/15 2301  By signing my name below, I, Shanna Cisco, attest that this documentation has been prepared under the direction and in the presence of Delora Fuel, MD. Electronically Signed: Shanna Cisco, ED Scribe. 12/28/15. 11:01 PM.        History   Chief Complaint Chief Complaint  Patient presents with  . Fall     The history is provided by the patient. No language interpreter was used.   HPI Comments:  Chad Rogers is a 80 y.o. male who presents to the Emergency Department complaining of left rib cage and back pain status post fall, which occurred a couple hours ago. Pt reports that he tripped and fell down approximately 6 steps tonight. Associated symptoms include tenderness to palpation in left rib cage and back. Pain is aggravated with sitting up. Denies hitting head or losing consciousness. Pt has consumed ETOH tonight.  Past Medical History:  Diagnosis Date  . Allergic rhinitis   . Cardiomyopathy [id number]   Acute systolic CHF at presentation; 05/2009 cardiac cath- 06/01/09  non obst coronary artery disease with EF 25%  . Hypertension   . Osteoarthritis   . Psoriasis   . Pulmonary fibrosis ([place])    12/2004 with basilar fibrotic changes;  PFT's 06/07/09 FEV1 1.98 (68%) raio 53 no better after B2,  DLC0105%  . Skin cancer   . Tobacco abuse    100 pack years; quit    Patient Active Problem List   Diagnosis Date Noted  . Cardiomyopathy ([place])   . Hypertension   . Pulmonary fibrosis ([place])   . Osteoarthritis   . Tobacco abuse     Past Surgical History:  Procedure Laterality Date  . HAND SURGERY    . INGUINAL HERNIA REPAIR    . ROTATOR CUFF REPAIR         Home Medications    Prior to Admission medications   Medication Sig Start Date End Date Taking? Authorizing Provider  albuterol (PROVENTIL HFA) 108  (90 BASE) MCG/ACT inhaler Inhale 2 puffs into the lungs every 6 (six) hours as needed.      Historical Provider, MD  ALPRAZolam Duanne Moron) 0.5 MG tablet Take 0.5 mg by mouth at bedtime as needed for sleep.    Historical Provider, MD  aspirin 81 MG tablet Take 81 mg by mouth daily.      Historical Provider, MD  carvedilol (COREG) 6.25 MG tablet Take 1 tablet by mouth   twice a day with a meal 11/01/14   Lendon Colonel, NP  COMBIVENT RESPIMAT 20-100 MCG/ACT AERS respimat  02/22/13   Historical Provider, MD  furosemide (LASIX) 40 MG tablet Take 1 tablet by mouth  daily 02/21/15   Lendon Colonel, NP  lisinopril (PRINIVIL,ZESTRIL) 20 MG tablet Take 1 tablet by mouth  daily 11/01/14   Lendon Colonel, NP  potassium chloride SA (K-DUR,KLOR-CON) 20 MEQ tablet Take 1 tablet by mouth  daily 02/21/15   Lendon Colonel, NP  sertraline (ZOLOFT) 50 MG tablet Take 50 mg by mouth daily.    Historical Provider, MD  spironolactone (ALDACTONE) 25 MG tablet Take 0.5 tablets (12.5 mg total) by mouth daily. 03/08/13   Lendon Colonel, NP  zolpidem (AMBIEN) 10 MG tablet Take 10 mg by mouth at bedtime as needed for sleep.    Historical Provider, MD  Family History Family History  Problem Relation Age of Onset  . Cancer Mother 29    gynecologic  . Aneurysm Father 26    cerebral aneurysm  . Asthma Maternal Grandmother     Social History Social History  Substance Use Topics  . Smoking status: Former Smoker    Packs/day: 2.00    Years: 50.00    Types: Cigarettes  . Smokeless tobacco: Former Systems developer  . Alcohol use 17.5 oz/week    35 Standard drinks or equivalent per week     Comment: 4 to 5 beers a day     Allergies   Review of patient's allergies indicates no known allergies.   Review of Systems Review of Systems  Musculoskeletal: Positive for arthralgias, back pain and myalgias.       Localized to left rib cage and back.  Neurological: Negative for syncope.  All other systems reviewed and  are negative.    Physical Exam Updated Vital Signs BP 111/60   Pulse 71   Resp 22   Ht 5\' 8"  (1.727 m)   Wt 165 lb (74.8 kg)   SpO2 (!) 88%   BMI 25.09 kg/m   Physical Exam  Constitutional: He is oriented to person, place, and time. He appears well-developed and well-nourished.  HENT:  Head: Normocephalic and atraumatic.  Eyes: EOM are normal. Pupils are equal, round, and reactive to light.  Neck: Normal range of motion. No JVD present.  Mild tenderness over the posterior aspect of neck.    Cardiovascular: Normal rate, regular rhythm and normal heart sounds.   No murmur heard. Pulmonary/Chest: Effort normal. He has no wheezes. He has no rales.  Breath sounds slightly decreased on left.  Abdominal: Soft. He exhibits no distension and no mass. There is tenderness.  Tender diffusely.  Musculoskeletal: Normal range of motion. He exhibits no edema.  Diffuse tenderness on left ribcage, no crepitus.    Lymphadenopathy:    He has no cervical adenopathy.  Neurological: He is alert and oriented to person, place, and time. No cranial nerve deficit. He exhibits normal muscle tone. Coordination normal.  Skin: Skin is warm and dry. No rash noted.  Multiple skin tears present on right arm.  Nursing note and vitals reviewed.    ED Treatments / Results  DIAGNOSTIC STUDIES:  Oxygen Saturation is 87% on [place], low by my interpretation.    COORDINATION OF CARE:  11:20 PM Discussed treatment plan with pt at bedside, which includes chest x-ray, and pt agreed to plan.  Labs (all labs ordered are listed, but only abnormal results are displayed) Labs Reviewed  COMPREHENSIVE METABOLIC PANEL - Abnormal; Notable for the following:       Result Value   Sodium 127 (*)    Chloride 91 (*)    Glucose, Bld 116 (*)    Calcium 8.7 (*)    All other components within normal limits  CBC WITH DIFFERENTIAL/PLATELET - Abnormal; Notable for the following:    WBC 16.7 (*)    MCH 34.7 (*)    Neutro  Abs 13.0 (*)    All other components within normal limits  TROPONIN I - Abnormal; Notable for the following:    Troponin I 0.04 (*)    All other components within normal limits  I-STAT CG4 LACTIC ACID, ED - Abnormal; Notable for the following:    Lactic Acid, Venous 5.63 (*)    All other components within normal limits  I-STAT CHEM 8, ED - Abnormal; Notable for  the following:    Sodium 128 (*)    Chloride 92 (*)    Glucose, Bld 110 (*)    Calcium, Ion 1.09 (*)    All other components within normal limits  LIPASE, BLOOD    EKG  EKG Interpretation  Date/Time:  Thursday December 28 2015 22:56:22 EDT Ventricular Rate:  73 PR Interval:    QRS Duration: 188 QT Interval:  477 QTC Calculation: 526 R Axis:   -90 Text Interpretation:  Sinus rhythm Premature atrial complexes Ventricular premature complex RBBB and LAFB First degree A-V block No old tracing to compare Confirmed by [facility]  MD, Cortez Flippen ([id number]) on 12/28/2015 10:59:24 PM       Radiology Ct Head Wo Contrast  Result Date: 12/29/2015 CLINICAL DATA:  Acute onset of left rib and back pain, status post fall down steps. Concern for head or cervical spine injury. Initial encounter. EXAM: CT HEAD WITHOUT CONTRAST CT CERVICAL SPINE WITHOUT CONTRAST TECHNIQUE: Multidetector CT imaging of the head and cervical spine was performed following the standard protocol without intravenous contrast. Multiplanar CT image reconstructions of the cervical spine were also generated. COMPARISON:  MRI of the brain performed 03/20/2005 FINDINGS: CT HEAD FINDINGS There is no evidence of acute infarction, mass lesion, or intra- or extra-axial hemorrhage on CT. Bilateral subdural hygromas are again noted. There is underlying mild cortical volume loss. Mild cerebellar atrophy is noted. Scattered periventricular and subcortical white matter change likely reflects small vessel ischemic microangiopathy. The brainstem and fourth ventricle are within normal limits. The basal  ganglia are unremarkable in appearance. The cerebral hemispheres demonstrate grossly normal gray-white differentiation. No mass effect or midline shift is seen. There is no evidence of fracture; visualized osseous structures are unremarkable in appearance. The orbits are within normal limits. The paranasal sinuses and mastoid air cells are well-aerated. No significant soft tissue abnormalities are seen. CT CERVICAL SPINE FINDINGS There is no evidence of fracture or subluxation. Vertebral bodies demonstrate normal height and alignment. Mild multilevel disc space narrowing is noted along the lower cervical spine, with scattered anterior and posterior disc osteophyte complexes, and underlying facet disease along the cervical spine. Prevertebral soft tissues are within normal limits. The thyroid gland is unremarkable in appearance. The visualized lung apices are clear. Scattered calcification is noted at the carotid bifurcations bilaterally, with likely moderate right-sided luminal narrowing. IMPRESSION: 1. No evidence of traumatic intracranial injury or fracture. 2. No evidence of fracture or subluxation along the cervical spine. 3. Mild cortical volume loss and scattered small vessel ischemic microangiopathy. 4. Bilateral subdural hygromas again noted. 5. Mild degenerative change along the cervical spine. 6. Scattered calcification at the carotid bifurcations bilaterally, with likely moderate right-sided luminal narrowing. Carotid ultrasound would be helpful for further evaluation, when and as deemed clinically appropriate. Electronically Signed   By: Garald Balding M.D.   On: 12/29/2015 01:07   Ct Chest W Contrast  Result Date: 12/29/2015 CLINICAL DATA:  80 year old male with acute left chest pain, abdominal and pelvic pain following fall. EXAM: CT CHEST, ABDOMEN, AND PELVIS WITH CONTRAST TECHNIQUE: Multidetector CT imaging of the chest, abdomen and pelvis was performed following the standard protocol during  bolus administration of intravenous contrast. CONTRAST:  166mL ISOVUE-300 IOPAMIDOL (ISOVUE-300) INJECTION 61% COMPARISON:  Prior radiographs FINDINGS: CT CHEST FINDINGS Cardiovascular: Cardiomegaly and coronary artery calcifications noted. There is no evidence of thoracic aortic aneurysm. Mediastinum/Nodes: No mediastinal mass, enlarged lymph nodes or pericardial effusion. Lungs/Pleura: A small anterior inferior left pneumothorax identified. Mild dependent and  basilar atelectasis noted. No airspace disease, suspicious nodule, mass or consolidation identified. Musculoskeletal: There are acute fractures of the left third through twelfth ribs. Fractures of the lateral fourth through seventh ribs are displaced. Left subcutaneous emphysema is identified. CT ABDOMEN PELVIS FINDINGS Hepatobiliary: Hepatic steatosis identified. The gallbladder is unremarkable. There is no evidence of biliary dilatation. Pancreas: Unremarkable Spleen: Unremarkable Adrenals/Urinary Tract: The kidneys, adrenal glands and bladder are unremarkable. Stomach/Bowel: No bowel obstruction or definite bowel wall thickening. The appendix is normal. Vascular/Lymphatic: Heavy abdominal aortic atherosclerotic calcifications noted without aneurysm. No enlarged lymph nodes identified. Reproductive: Unremarkable Other: No free fluid or pneumoperitoneum. A small to moderate right inguinal hernia containing small bowel noted without bowel obstruction. A small left inguinal hernia containing fat is present. Musculoskeletal: A nondisplaced fracture of the anterior left acetabulum noted. Nondisplaced fractures of the right L3 and L4 transverse process is noted. IMPRESSION: Acute fractures of the left third through twelfth ribs with small left pneumothorax and left subcutaneous emphysema. Acute nondisplaced fractures of the anterior left acetabulum and right L3 and L4 transverse processes. No evidence of solid or hollow visceral injury. Cardiomegaly and coronary  artery disease. Hepatic steatosis. Abdominal aortic atherosclerosis. Critical Value/emergent results were called by telephone at the time of interpretation on 12/29/2015 at 1:08 am to Dr. Delora Fuel , who verbally acknowledged these results. Electronically Signed   By: Margarette Canada M.D.   On: 12/29/2015 01:08   Ct Cervical Spine Wo Contrast  Result Date: 12/29/2015 CLINICAL DATA:  Acute onset of left rib and back pain, status post fall down steps. Concern for head or cervical spine injury. Initial encounter. EXAM: CT HEAD WITHOUT CONTRAST CT CERVICAL SPINE WITHOUT CONTRAST TECHNIQUE: Multidetector CT imaging of the head and cervical spine was performed following the standard protocol without intravenous contrast. Multiplanar CT image reconstructions of the cervical spine were also generated. COMPARISON:  MRI of the brain performed 03/20/2005 FINDINGS: CT HEAD FINDINGS There is no evidence of acute infarction, mass lesion, or intra- or extra-axial hemorrhage on CT. Bilateral subdural hygromas are again noted. There is underlying mild cortical volume loss. Mild cerebellar atrophy is noted. Scattered periventricular and subcortical white matter change likely reflects small vessel ischemic microangiopathy. The brainstem and fourth ventricle are within normal limits. The basal ganglia are unremarkable in appearance. The cerebral hemispheres demonstrate grossly normal gray-white differentiation. No mass effect or midline shift is seen. There is no evidence of fracture; visualized osseous structures are unremarkable in appearance. The orbits are within normal limits. The paranasal sinuses and mastoid air cells are well-aerated. No significant soft tissue abnormalities are seen. CT CERVICAL SPINE FINDINGS There is no evidence of fracture or subluxation. Vertebral bodies demonstrate normal height and alignment. Mild multilevel disc space narrowing is noted along the lower cervical spine, with scattered anterior and  posterior disc osteophyte complexes, and underlying facet disease along the cervical spine. Prevertebral soft tissues are within normal limits. The thyroid gland is unremarkable in appearance. The visualized lung apices are clear. Scattered calcification is noted at the carotid bifurcations bilaterally, with likely moderate right-sided luminal narrowing. IMPRESSION: 1. No evidence of traumatic intracranial injury or fracture. 2. No evidence of fracture or subluxation along the cervical spine. 3. Mild cortical volume loss and scattered small vessel ischemic microangiopathy. 4. Bilateral subdural hygromas again noted. 5. Mild degenerative change along the cervical spine. 6. Scattered calcification at the carotid bifurcations bilaterally, with likely moderate right-sided luminal narrowing. Carotid ultrasound would be helpful for further evaluation, when and  as deemed clinically appropriate. Electronically Signed   By: Garald Balding M.D.   On: 12/29/2015 01:07   Ct Abdomen Pelvis W Contrast  Result Date: 12/29/2015 CLINICAL DATA:  80 year old male with acute left chest pain, abdominal and pelvic pain following fall. EXAM: CT CHEST, ABDOMEN, AND PELVIS WITH CONTRAST TECHNIQUE: Multidetector CT imaging of the chest, abdomen and pelvis was performed following the standard protocol during bolus administration of intravenous contrast. CONTRAST:  164mL ISOVUE-300 IOPAMIDOL (ISOVUE-300) INJECTION 61% COMPARISON:  Prior radiographs FINDINGS: CT CHEST FINDINGS Cardiovascular: Cardiomegaly and coronary artery calcifications noted. There is no evidence of thoracic aortic aneurysm. Mediastinum/Nodes: No mediastinal mass, enlarged lymph nodes or pericardial effusion. Lungs/Pleura: A small anterior inferior left pneumothorax identified. Mild dependent and basilar atelectasis noted. No airspace disease, suspicious nodule, mass or consolidation identified. Musculoskeletal: There are acute fractures of the left third through  twelfth ribs. Fractures of the lateral fourth through seventh ribs are displaced. Left subcutaneous emphysema is identified. CT ABDOMEN PELVIS FINDINGS Hepatobiliary: Hepatic steatosis identified. The gallbladder is unremarkable. There is no evidence of biliary dilatation. Pancreas: Unremarkable Spleen: Unremarkable Adrenals/Urinary Tract: The kidneys, adrenal glands and bladder are unremarkable. Stomach/Bowel: No bowel obstruction or definite bowel wall thickening. The appendix is normal. Vascular/Lymphatic: Heavy abdominal aortic atherosclerotic calcifications noted without aneurysm. No enlarged lymph nodes identified. Reproductive: Unremarkable Other: No free fluid or pneumoperitoneum. A small to moderate right inguinal hernia containing small bowel noted without bowel obstruction. A small left inguinal hernia containing fat is present. Musculoskeletal: A nondisplaced fracture of the anterior left acetabulum noted. Nondisplaced fractures of the right L3 and L4 transverse process is noted. IMPRESSION: Acute fractures of the left third through twelfth ribs with small left pneumothorax and left subcutaneous emphysema. Acute nondisplaced fractures of the anterior left acetabulum and right L3 and L4 transverse processes. No evidence of solid or hollow visceral injury. Cardiomegaly and coronary artery disease. Hepatic steatosis. Abdominal aortic atherosclerosis. Critical Value/emergent results were called by telephone at the time of interpretation on 12/29/2015 at 1:08 am to Dr. Delora Fuel , who verbally acknowledged these results. Electronically Signed   By: Margarette Canada M.D.   On: 12/29/2015 01:08   Dg Chest Port 1 View  Result Date: 12/29/2015 CLINICAL DATA:  Acute onset of left rib pain and back pain, status post fall down steps. Initial encounter. EXAM: PORTABLE CHEST 1 VIEW COMPARISON:  Chest radiograph performed 09/05/2011 FINDINGS: The lungs are well-aerated. Mild vascular congestion is noted. Mild left  basilar atelectasis is seen. There is no evidence of pleural effusion or pneumothorax. The cardiomediastinal silhouette is enlarged. There is a displaced fracture of the left lateral seventh rib, with a small amount of associated soft tissue air along the chest wall. IMPRESSION: 1. Displaced fracture of the left lateral seventh rib, with small amount of associated soft tissue air along the left chest wall. Mild left basilar atelectasis noted. 2. Mild vascular congestion and cardiomegaly noted. Electronically Signed   By: Garald Balding M.D.   On: 12/29/2015 00:10    Procedures Procedures (including critical care time) CRITICAL CARE Performed by: [id number] Total critical care time: 90 minutes Critical care time was exclusive of separately billable procedures and treating other patients. Critical care was necessary to treat or prevent imminent or life-threatening deterioration. Critical care was time spent personally by me on the following activities: development of treatment plan with patient and/or surrogate as well as nursing, discussions with consultants, evaluation of patient's response to treatment, examination of patient, obtaining history  from patient or surrogate, ordering and performing treatments and interventions, ordering and review of laboratory studies, ordering and review of radiographic studies, pulse oximetry and re-evaluation of patient's condition.  Medications Ordered in ED Medications  Tdap (BOOSTRIX) injection 0.5 mL (0.5 mLs Intramuscular Given 12/28/15 2327)  sodium chloride 0.9 % bolus 1,000 mL (1,000 mLs Intravenous New Bag/Given 12/28/15 2357)  iopamidol (ISOVUE-300) 61 % injection 100 mL (100 mLs Intravenous Contrast Given 12/29/15 0023)  ondansetron (ZOFRAN) injection 4 mg (4 mg Intravenous Given 12/29/15 0121)  morphine 4 MG/ML injection 4 mg (4 mg Intravenous Given 12/29/15 0121)     Initial Impression / Assessment and Plan / ED Course  I have reviewed the triage  vital signs and the nursing notes.  Pertinent labs & imaging results that were available during my care of the patient were reviewed by me and considered in my medical decision making (see chart for details).  Clinical Course    Fall down steps with injury to left rib cage. Portable chest x-ray shows no evidence of pneumothorax, blood multiple rib fractures are identified. He is sent for CT of head, cervical spine, chest, abdomen, pelvis. I anticipate he will need to be transferred to the trauma service at [facility].  CT scans show multiple left sided rib fractures which are both lateral and posterior. There is a small left pneumothorax present which does not require chest tube at this time. Also seen are fractures of transverse processes of 2 lumbar vertebrae and also nondisplaced fracture of the acetabulum - these findings were discussed with the radiologist. Initial lactic acid level is come back over 5 and is given a liter of fluid. Fluid administration is cautious given his history of CHF. Case is discussed with Dr. Grandville Silos of trauma service who agrees to accept the patient in transfer her to [facility].  Final Clinical Impressions(s) / ED Diagnoses   Final diagnoses:  Fall down steps, initial encounter  Traumatic fracture of ribs with pneumothorax, left, closed, initial encounter  Fracture of transverse process of lumbar vertebra, closed, initial encounter ([place])  Closed fracture of anterior lip of left acetabulum, initial encounter ([facility])  Elevated lactic acid level    New Prescriptions New Prescriptions   No medications on file  I personally performed the services described in this documentation, which was scribed in my presence. The recorded information has been reviewed and is accurate.       Delora Fuel, MD [id number]  Patient's oxygen saturation started dropping, so chest x-ray was obtained to make sure that he was not developing significant pulmonary contusion  or worsening of his small pneumothorax. Chest x-ray showed no significant change from prior x-ray.   Delora Fuel, MD [id number]

## 2015-12-28 NOTE — ED Notes (Signed)
Pt has skin tears noted to left elbow, right hand, right forearm, right upper arm, all areas cleaned with sure cleanse, pt tolerated well, tegaderm applied,

## 2015-12-28 NOTE — ED Notes (Signed)
Family at bedside reports that pt was suppose to be wearing oxygen at one time but he refused it, also reports that pt will drink at least 6 beers or more daily.

## 2015-12-28 NOTE — ED Notes (Signed)
Pt placed on oxygen upon arrival to er at 4 lpm via [place] with increase to pulse ox 94-95%,

## 2015-12-28 NOTE — ED Notes (Signed)
MD at bedside. 

## 2015-12-28 NOTE — ED Notes (Signed)
Dr Roxanne Mins notified of lactic acid, additional orders given,

## 2015-12-28 NOTE — ED Triage Notes (Signed)
Pt fell down 5-6 steps and c/o back/rib pain. Pt has been drinking etoh tonight.

## 2015-12-29 ENCOUNTER — Inpatient Hospital Stay (HOSPITAL_COMMUNITY): Payer: Medicare Other

## 2015-12-29 ENCOUNTER — Inpatient Hospital Stay (HOSPITAL_COMMUNITY): Payer: Medicare Other | Admitting: Anesthesiology

## 2015-12-29 ENCOUNTER — Encounter (HOSPITAL_COMMUNITY): Payer: Self-pay | Admitting: *Deleted

## 2015-12-29 DIAGNOSIS — I110 Hypertensive heart disease with heart failure: Secondary | ICD-10-CM | POA: Diagnosis not present

## 2015-12-29 DIAGNOSIS — I5022 Chronic systolic (congestive) heart failure: Secondary | ICD-10-CM | POA: Diagnosis not present

## 2015-12-29 DIAGNOSIS — D62 Acute posthemorrhagic anemia: Secondary | ICD-10-CM | POA: Diagnosis not present

## 2015-12-29 DIAGNOSIS — R0781 Pleurodynia: Secondary | ICD-10-CM | POA: Diagnosis not present

## 2015-12-29 DIAGNOSIS — R451 Restlessness and agitation: Secondary | ICD-10-CM | POA: Diagnosis not present

## 2015-12-29 DIAGNOSIS — J9601 Acute respiratory failure with hypoxia: Secondary | ICD-10-CM | POA: Diagnosis not present

## 2015-12-29 DIAGNOSIS — R079 Chest pain, unspecified: Secondary | ICD-10-CM | POA: Diagnosis not present

## 2015-12-29 DIAGNOSIS — J9600 Acute respiratory failure, unspecified whether with hypoxia or hypercapnia: Secondary | ICD-10-CM | POA: Diagnosis not present

## 2015-12-29 DIAGNOSIS — S2232XA Fracture of one rib, left side, initial encounter for closed fracture: Secondary | ICD-10-CM | POA: Diagnosis not present

## 2015-12-29 DIAGNOSIS — S199XXA Unspecified injury of neck, initial encounter: Secondary | ICD-10-CM | POA: Diagnosis not present

## 2015-12-29 DIAGNOSIS — K567 Ileus, unspecified: Secondary | ICD-10-CM | POA: Diagnosis not present

## 2015-12-29 DIAGNOSIS — J9690 Respiratory failure, unspecified, unspecified whether with hypoxia or hypercapnia: Secondary | ICD-10-CM | POA: Diagnosis not present

## 2015-12-29 DIAGNOSIS — I998 Other disorder of circulatory system: Secondary | ICD-10-CM | POA: Diagnosis not present

## 2015-12-29 DIAGNOSIS — I4891 Unspecified atrial fibrillation: Secondary | ICD-10-CM | POA: Diagnosis not present

## 2015-12-29 DIAGNOSIS — I739 Peripheral vascular disease, unspecified: Secondary | ICD-10-CM | POA: Diagnosis not present

## 2015-12-29 DIAGNOSIS — Z452 Encounter for adjustment and management of vascular access device: Secondary | ICD-10-CM | POA: Diagnosis not present

## 2015-12-29 DIAGNOSIS — J440 Chronic obstructive pulmonary disease with acute lower respiratory infection: Secondary | ICD-10-CM | POA: Diagnosis not present

## 2015-12-29 DIAGNOSIS — N289 Disorder of kidney and ureter, unspecified: Secondary | ICD-10-CM | POA: Diagnosis not present

## 2015-12-29 DIAGNOSIS — S32810A Multiple fractures of pelvis with stable disruption of pelvic ring, initial encounter for closed fracture: Secondary | ICD-10-CM | POA: Diagnosis not present

## 2015-12-29 DIAGNOSIS — N179 Acute kidney failure, unspecified: Secondary | ICD-10-CM | POA: Diagnosis not present

## 2015-12-29 DIAGNOSIS — S3993XA Unspecified injury of pelvis, initial encounter: Secondary | ICD-10-CM | POA: Diagnosis not present

## 2015-12-29 DIAGNOSIS — J939 Pneumothorax, unspecified: Secondary | ICD-10-CM | POA: Diagnosis not present

## 2015-12-29 DIAGNOSIS — S32415A Nondisplaced fracture of anterior wall of left acetabulum, initial encounter for closed fracture: Secondary | ICD-10-CM | POA: Diagnosis not present

## 2015-12-29 DIAGNOSIS — I429 Cardiomyopathy, unspecified: Secondary | ICD-10-CM | POA: Diagnosis not present

## 2015-12-29 DIAGNOSIS — I509 Heart failure, unspecified: Secondary | ICD-10-CM | POA: Diagnosis not present

## 2015-12-29 DIAGNOSIS — S32435A Nondisplaced fracture of anterior column [iliopubic] of left acetabulum, initial encounter for closed fracture: Secondary | ICD-10-CM | POA: Diagnosis not present

## 2015-12-29 DIAGNOSIS — W109XXA Fall (on) (from) unspecified stairs and steps, initial encounter: Secondary | ICD-10-CM | POA: Diagnosis not present

## 2015-12-29 DIAGNOSIS — R0602 Shortness of breath: Secondary | ICD-10-CM | POA: Diagnosis not present

## 2015-12-29 DIAGNOSIS — E870 Hyperosmolality and hypernatremia: Secondary | ICD-10-CM | POA: Diagnosis not present

## 2015-12-29 DIAGNOSIS — I481 Persistent atrial fibrillation: Secondary | ICD-10-CM | POA: Diagnosis not present

## 2015-12-29 DIAGNOSIS — J95812 Postprocedural air leak: Secondary | ICD-10-CM | POA: Diagnosis not present

## 2015-12-29 DIAGNOSIS — I255 Ischemic cardiomyopathy: Secondary | ICD-10-CM | POA: Diagnosis not present

## 2015-12-29 DIAGNOSIS — D649 Anemia, unspecified: Secondary | ICD-10-CM | POA: Diagnosis not present

## 2015-12-29 DIAGNOSIS — I472 Ventricular tachycardia: Secondary | ICD-10-CM | POA: Diagnosis not present

## 2015-12-29 DIAGNOSIS — Z4682 Encounter for fitting and adjustment of non-vascular catheter: Secondary | ICD-10-CM | POA: Diagnosis not present

## 2015-12-29 DIAGNOSIS — Z430 Encounter for attention to tracheostomy: Secondary | ICD-10-CM | POA: Diagnosis not present

## 2015-12-29 DIAGNOSIS — J8410 Pulmonary fibrosis, unspecified: Secondary | ICD-10-CM | POA: Diagnosis not present

## 2015-12-29 DIAGNOSIS — S32412A Displaced fracture of anterior wall of left acetabulum, initial encounter for closed fracture: Secondary | ICD-10-CM | POA: Diagnosis not present

## 2015-12-29 DIAGNOSIS — R570 Cardiogenic shock: Secondary | ICD-10-CM | POA: Diagnosis not present

## 2015-12-29 DIAGNOSIS — J189 Pneumonia, unspecified organism: Secondary | ICD-10-CM | POA: Diagnosis not present

## 2015-12-29 DIAGNOSIS — Z9911 Dependence on respirator [ventilator] status: Secondary | ICD-10-CM | POA: Diagnosis not present

## 2015-12-29 DIAGNOSIS — S32039A Unspecified fracture of third lumbar vertebra, initial encounter for closed fracture: Secondary | ICD-10-CM | POA: Diagnosis not present

## 2015-12-29 DIAGNOSIS — J9811 Atelectasis: Secondary | ICD-10-CM | POA: Diagnosis not present

## 2015-12-29 DIAGNOSIS — I480 Paroxysmal atrial fibrillation: Secondary | ICD-10-CM | POA: Diagnosis not present

## 2015-12-29 DIAGNOSIS — I5043 Acute on chronic combined systolic (congestive) and diastolic (congestive) heart failure: Secondary | ICD-10-CM | POA: Diagnosis not present

## 2015-12-29 DIAGNOSIS — M1990 Unspecified osteoarthritis, unspecified site: Secondary | ICD-10-CM | POA: Diagnosis not present

## 2015-12-29 DIAGNOSIS — S2242XA Multiple fractures of ribs, left side, initial encounter for closed fracture: Secondary | ICD-10-CM | POA: Diagnosis not present

## 2015-12-29 DIAGNOSIS — E222 Syndrome of inappropriate secretion of antidiuretic hormone: Secondary | ICD-10-CM | POA: Diagnosis not present

## 2015-12-29 DIAGNOSIS — E872 Acidosis: Secondary | ICD-10-CM | POA: Diagnosis present

## 2015-12-29 DIAGNOSIS — Y95 Nosocomial condition: Secondary | ICD-10-CM | POA: Diagnosis not present

## 2015-12-29 DIAGNOSIS — I5023 Acute on chronic systolic (congestive) heart failure: Secondary | ICD-10-CM | POA: Diagnosis not present

## 2015-12-29 DIAGNOSIS — G8918 Other acute postprocedural pain: Secondary | ICD-10-CM | POA: Diagnosis not present

## 2015-12-29 DIAGNOSIS — S270XXA Traumatic pneumothorax, initial encounter: Secondary | ICD-10-CM | POA: Diagnosis not present

## 2015-12-29 DIAGNOSIS — I517 Cardiomegaly: Secondary | ICD-10-CM | POA: Diagnosis not present

## 2015-12-29 DIAGNOSIS — W19XXXA Unspecified fall, initial encounter: Secondary | ICD-10-CM | POA: Diagnosis not present

## 2015-12-29 DIAGNOSIS — S2249XA Multiple fractures of ribs, unspecified side, initial encounter for closed fracture: Secondary | ICD-10-CM | POA: Diagnosis present

## 2015-12-29 DIAGNOSIS — J90 Pleural effusion, not elsewhere classified: Secondary | ICD-10-CM | POA: Diagnosis not present

## 2015-12-29 DIAGNOSIS — S0990XA Unspecified injury of head, initial encounter: Secondary | ICD-10-CM | POA: Diagnosis not present

## 2015-12-29 DIAGNOSIS — S299XXA Unspecified injury of thorax, initial encounter: Secondary | ICD-10-CM | POA: Diagnosis not present

## 2015-12-29 DIAGNOSIS — S32049A Unspecified fracture of fourth lumbar vertebra, initial encounter for closed fracture: Secondary | ICD-10-CM | POA: Diagnosis not present

## 2015-12-29 DIAGNOSIS — R0989 Other specified symptoms and signs involving the circulatory and respiratory systems: Secondary | ICD-10-CM | POA: Diagnosis not present

## 2015-12-29 DIAGNOSIS — R918 Other nonspecific abnormal finding of lung field: Secondary | ICD-10-CM | POA: Diagnosis not present

## 2015-12-29 DIAGNOSIS — Z930 Tracheostomy status: Secondary | ICD-10-CM | POA: Diagnosis not present

## 2015-12-29 LAB — COMPREHENSIVE METABOLIC PANEL
ALBUMIN: 4.2 g/dL (ref 3.5–5.0)
ALT: 18 U/L (ref 17–63)
ANION GAP: 14 (ref 5–15)
AST: 27 U/L (ref 15–41)
Alkaline Phosphatase: 73 U/L (ref 38–126)
BUN: 11 mg/dL (ref 6–20)
CHLORIDE: 91 mmol/L — AB (ref 101–111)
CO2: 22 mmol/L (ref 22–32)
Calcium: 8.7 mg/dL — ABNORMAL LOW (ref 8.9–10.3)
Creatinine, Ser: 0.9 mg/dL (ref 0.61–1.24)
GFR calc non Af Amer: >60 mL/min (ref >60)
GLUCOSE: 116 mg/dL — AB (ref 65–99)
Potassium: 4.2 mmol/L (ref 3.5–5.1)
SODIUM: 127 mmol/L — AB (ref 135–145)
Total Bilirubin: 0.8 mg/dL (ref 0.3–1.2)
Total Protein: 7.2 g/dL (ref 6.5–8.1)

## 2015-12-29 LAB — MRSA PCR SCREENING: MRSA BY PCR: NEGATIVE

## 2015-12-29 LAB — TROPONIN I: Troponin I: 0.04 ng/mL (ref <0.03)

## 2015-12-29 LAB — LIPASE, BLOOD: LIPASE: 31 U/L (ref 11–51)

## 2015-12-29 MED ORDER — ALBUMIN HUMAN 5 % IV SOLN
INTRAVENOUS | Status: AC
  Administered 2015-12-29: 25 g via INTRAVENOUS
  Filled 2015-12-29: qty 500

## 2015-12-29 MED ORDER — VITAMIN B-1 100 MG PO TABS
100.0000 mg | ORAL_TABLET | Freq: Every day | ORAL | Status: DC
  Administered 2015-12-29 – 2016-01-19 (×19): 100 mg via ORAL
  Filled 2015-12-29 (×19): qty 1

## 2015-12-29 MED ORDER — FENTANYL CITRATE (PF) 100 MCG/2ML IJ SOLN
INTRAMUSCULAR | Status: AC
  Administered 2015-12-29: 75 ug via INTRAVENOUS
  Filled 2015-12-29: qty 2

## 2015-12-29 MED ORDER — IPRATROPIUM-ALBUTEROL 0.5-2.5 (3) MG/3ML IN SOLN
RESPIRATORY_TRACT | Status: AC
  Filled 2015-12-29: qty 3

## 2015-12-29 MED ORDER — POTASSIUM CHLORIDE CRYS ER 20 MEQ PO TBCR
20.0000 meq | EXTENDED_RELEASE_TABLET | Freq: Every day | ORAL | Status: DC
  Administered 2015-12-29: 20 meq via ORAL
  Filled 2015-12-29 (×2): qty 1

## 2015-12-29 MED ORDER — ONDANSETRON HCL 4 MG/2ML IJ SOLN
4.0000 mg | Freq: Once | INTRAMUSCULAR | Status: AC
  Administered 2015-12-29: 4 mg via INTRAVENOUS
  Filled 2015-12-29: qty 2

## 2015-12-29 MED ORDER — FENTANYL CITRATE (PF) 100 MCG/2ML IJ SOLN
75.0000 ug | Freq: Once | INTRAMUSCULAR | Status: AC
  Administered 2015-12-29: 75 ug via INTRAVENOUS

## 2015-12-29 MED ORDER — HYDROMORPHONE HCL 1 MG/ML IJ SOLN
0.5000 mg | INTRAMUSCULAR | Status: DC | PRN
  Administered 2015-12-30: 1 mg via INTRAVENOUS
  Administered 2015-12-30 – 2016-01-13 (×4): 0.5 mg via INTRAVENOUS
  Administered 2016-01-13 – 2016-01-17 (×13): 1 mg via INTRAVENOUS
  Filled 2015-12-29 (×19): qty 1

## 2015-12-29 MED ORDER — OXYCODONE HCL 5 MG PO TABS
5.0000 mg | ORAL_TABLET | ORAL | Status: DC | PRN
  Administered 2015-12-29 (×2): 5 mg via ORAL
  Filled 2015-12-29 (×2): qty 1

## 2015-12-29 MED ORDER — PHENYLEPHRINE HCL 10 MG/ML IJ SOLN
INTRAMUSCULAR | Status: DC | PRN
Start: 2015-12-29 — End: 2015-12-29
  Administered 2015-12-29: 80 ug via INTRAVENOUS

## 2015-12-29 MED ORDER — ONDANSETRON HCL 4 MG PO TABS: 4.0000 mg | ORAL_TABLET | Freq: Four times a day (QID) | ORAL | Status: DC | PRN

## 2015-12-29 MED ORDER — EPHEDRINE SULFATE 50 MG/ML IJ SOLN
INTRAMUSCULAR | Status: DC | PRN
  Administered 2015-12-29: 10 mg via INTRAVENOUS

## 2015-12-29 MED ORDER — LISINOPRIL 20 MG PO TABS
20.0000 mg | ORAL_TABLET | Freq: Every day | ORAL | Status: DC
  Filled 2015-12-29: qty 1

## 2015-12-29 MED ORDER — ADULT MULTIVITAMIN W/MINERALS CH
1.0000 | ORAL_TABLET | Freq: Every day | ORAL | Status: DC
  Administered 2015-12-29 – 2016-01-19 (×21): 1 via ORAL
  Filled 2015-12-29 (×22): qty 1

## 2015-12-29 MED ORDER — LORAZEPAM 1 MG PO TABS: 1.0000 mg | ORAL_TABLET | Freq: Four times a day (QID) | ORAL | Status: AC | PRN

## 2015-12-29 MED ORDER — PHENYLEPHRINE HCL 10 MG/ML IJ SOLN
0.0000 ug/min | INTRAVENOUS | Status: DC
  Administered 2015-12-29: 70 ug/min via INTRAVENOUS
  Administered 2015-12-29: 80 ug/min via INTRAVENOUS
  Administered 2015-12-29: 100 ug/min via INTRAVENOUS
  Administered 2015-12-29: 30 ug/min via INTRAVENOUS
  Administered 2015-12-29 (×2): 100 ug/min via INTRAVENOUS
  Administered 2015-12-30 (×11): 110 ug/min via INTRAVENOUS
  Administered 2015-12-30: 275 ug/min via INTRAVENOUS
  Filled 2015-12-29 (×23): qty 1

## 2015-12-29 MED ORDER — ACETAMINOPHEN 325 MG PO TABS
650.0000 mg | ORAL_TABLET | ORAL | Status: DC | PRN
  Administered 2016-01-04 – 2016-01-06 (×4): 650 mg via ORAL
  Filled 2015-12-29 (×4): qty 2

## 2015-12-29 MED ORDER — ALPRAZOLAM 0.5 MG PO TABS
0.5000 mg | ORAL_TABLET | Freq: Every evening | ORAL | Status: DC | PRN
  Administered 2016-01-04 – 2016-01-18 (×8): 0.5 mg via ORAL
  Filled 2015-12-29 (×8): qty 1

## 2015-12-29 MED ORDER — THIAMINE HCL 100 MG/ML IJ SOLN
100.0000 mg | Freq: Every day | INTRAMUSCULAR | Status: DC
  Administered 2015-12-31 – 2016-01-16 (×4): 100 mg via INTRAVENOUS
  Filled 2015-12-29 (×7): qty 2

## 2015-12-29 MED ORDER — ONDANSETRON HCL 4 MG/2ML IJ SOLN: 4.0000 mg | Freq: Four times a day (QID) | INTRAMUSCULAR | Status: DC | PRN

## 2015-12-29 MED ORDER — LORAZEPAM 2 MG/ML IJ SOLN: 0.0000 mg | Freq: Two times a day (BID) | INTRAMUSCULAR | Status: AC

## 2015-12-29 MED ORDER — LORAZEPAM 2 MG/ML IJ SOLN
0.0000 mg | Freq: Four times a day (QID) | INTRAMUSCULAR | Status: AC
  Administered 2015-12-30: 2 mg via INTRAVENOUS
  Filled 2015-12-29 (×2): qty 1

## 2015-12-29 MED ORDER — MORPHINE SULFATE (PF) 4 MG/ML IV SOLN
4.0000 mg | Freq: Once | INTRAVENOUS | Status: AC
  Administered 2015-12-29: 4 mg via INTRAVENOUS

## 2015-12-29 MED ORDER — MORPHINE SULFATE (PF) 4 MG/ML IV SOLN
4.0000 mg | Freq: Once | INTRAVENOUS | Status: AC
  Administered 2015-12-29: 4 mg via INTRAVENOUS
  Filled 2015-12-29: qty 1

## 2015-12-29 MED ORDER — IPRATROPIUM-ALBUTEROL 20-100 MCG/ACT IN AERS: 1.0000 | INHALATION_SPRAY | Freq: Four times a day (QID) | RESPIRATORY_TRACT | Status: DC

## 2015-12-29 MED ORDER — POTASSIUM CHLORIDE IN NACL 20-0.9 MEQ/L-% IV SOLN
INTRAVENOUS | Status: DC
  Administered 2015-12-29: 07:00:00 via INTRAVENOUS
  Filled 2015-12-29: qty 1000

## 2015-12-29 MED ORDER — CARVEDILOL 3.125 MG PO TABS
6.2500 mg | ORAL_TABLET | Freq: Two times a day (BID) | ORAL | Status: DC
  Administered 2015-12-29: 6.25 mg via ORAL
  Filled 2015-12-29: qty 2

## 2015-12-29 MED ORDER — FOLIC ACID 1 MG PO TABS
1.0000 mg | ORAL_TABLET | Freq: Every day | ORAL | Status: DC
  Administered 2015-12-29 – 2016-01-19 (×21): 1 mg via ORAL
  Filled 2015-12-29 (×20): qty 1

## 2015-12-29 MED ORDER — IPRATROPIUM-ALBUTEROL 0.5-2.5 (3) MG/3ML IN SOLN
3.0000 mL | RESPIRATORY_TRACT | Status: DC
  Administered 2015-12-29 – 2016-01-04 (×36): 3 mL via RESPIRATORY_TRACT
  Filled 2015-12-29 (×37): qty 3

## 2015-12-29 MED ORDER — LORAZEPAM 2 MG/ML IJ SOLN: 1.0000 mg | Freq: Four times a day (QID) | INTRAMUSCULAR | Status: AC | PRN

## 2015-12-29 MED ORDER — ALBUMIN HUMAN 5 % IV SOLN
25.0000 g | Freq: Once | INTRAVENOUS | Status: AC
  Administered 2015-12-29: 25 g via INTRAVENOUS

## 2015-12-29 MED ORDER — OXYCODONE HCL 5 MG PO TABS: 10.0000 mg | ORAL_TABLET | ORAL | Status: DC | PRN

## 2015-12-29 MED ORDER — FUROSEMIDE 40 MG PO TABS
40.0000 mg | ORAL_TABLET | Freq: Every day | ORAL | Status: DC
  Administered 2015-12-29 – 2015-12-30 (×2): 40 mg via ORAL
  Filled 2015-12-29 (×2): qty 1

## 2015-12-29 MED ORDER — CARVEDILOL 3.125 MG PO TABS
6.2500 mg | ORAL_TABLET | Freq: Two times a day (BID) | ORAL | Status: DC
  Administered 2015-12-29: 6.25 mg via ORAL
  Filled 2015-12-29 (×2): qty 2

## 2015-12-29 MED ORDER — ZOLPIDEM TARTRATE 5 MG PO TABS: 5.0000 mg | ORAL_TABLET | Freq: Every evening | ORAL | Status: DC | PRN

## 2015-12-29 MED ORDER — LIDOCAINE-EPINEPHRINE (PF) 1.5 %-1:200000 IJ SOLN
INTRAMUSCULAR | Status: DC | PRN
  Administered 2015-12-29: 3 mg via EPIDURAL
  Administered 2015-12-29: 2 mg via EPIDURAL
  Administered 2015-12-29: 3 mg via EPIDURAL

## 2015-12-29 MED ORDER — ROPIVACAINE HCL 2 MG/ML IJ SOLN
10.0000 mL/h | INTRAMUSCULAR | Status: DC
  Administered 2015-12-29 – 2015-12-30 (×2): 10 mL/h via EPIDURAL
  Filled 2015-12-29 (×7): qty 200

## 2015-12-29 MED ORDER — SERTRALINE HCL 50 MG PO TABS
50.0000 mg | ORAL_TABLET | Freq: Every day | ORAL | Status: DC
  Administered 2015-12-29 – 2016-01-19 (×21): 50 mg via ORAL
  Filled 2015-12-29 (×22): qty 1

## 2015-12-29 MED ORDER — CETYLPYRIDINIUM CHLORIDE 0.05 % MT LIQD
7.0000 mL | Freq: Two times a day (BID) | OROMUCOSAL | Status: DC
  Administered 2015-12-29 – 2015-12-30 (×3): 7 mL via OROMUCOSAL

## 2015-12-29 NOTE — Progress Notes (Signed)
Patient saturations dropped as low as 83%. Venturi mask 02 increased to max at 55% and patient encouraged to cough and deep breathe. Pt reported increased pain.  No improvement in saturations. Patient quickly placed on 100% NRB 15L. Sats increased to 89-90%. Trauma MD aware. Plan is for anesthesia to place epidural catheter for rib fracture pain. Will continue to monitor.

## 2015-12-29 NOTE — Progress Notes (Signed)
Trauma Service Note  Subjective: Patient is talking, but having significant pain on the right chest wall.  Objective: Vital signs in last 24 hours: Temp:  [97.4 F (36.3 C)-98 F (36.7 C)] 97.7 F (36.5 C) (08/11 0500) Pulse Rate:  [25-93] 86 (08/11 0700) Resp:  [16-31] 22 (08/11 0700) BP: (105-138)/(49-89) 138/65 (08/11 0700) SpO2:  [87 %-100 %] 94 % (08/11 0700) FiO2 (%):  [35 %-50 %] 45 % (08/11 0751) Weight:  [74.8 kg (165 lb)] 74.8 kg (165 lb) (08/10 2254)    Intake/Output from previous day: 08/10 0701 - 08/11 0700 In: 1000 [I.V.:1000] Out: -  Intake/Output this shift: No intake/output data recorded.  General:  Moderate acute distress.  Lungs:  Clear but diminished in the bases.  Only able to get IS up to 500cc inconsistently.  Abd: Soft, benign  Extremities: No changes  Neuro: Intact  Lab Results: CBC   Recent Labs  12/28/15 2334 12/28/15 2350  WBC 16.7*  --   HGB 15.3 15.6  HCT 43.8 46.0  PLT 202  --    BMET  Recent Labs  12/28/15 2334 12/28/15 2350  NA 127* 128*  K 4.2 4.4  CL 91* 92*  CO2 22  --   GLUCOSE 116* 110*  BUN 11 10  CREATININE 0.90 0.80  CALCIUM 8.7*  --    PT/INR No results for input(s): LABPROT, INR in the last 72 hours. ABG No results for input(s): PHART, HCO3 in the last 72 hours.  Invalid input(s): PCO2, PO2  Studies/Results: Ct Head Wo Contrast  Result Date: 12/29/2015 CLINICAL DATA:  Acute onset of left rib and back pain, status post fall down steps. Concern for head or cervical spine injury. Initial encounter. EXAM: CT HEAD WITHOUT CONTRAST CT CERVICAL SPINE WITHOUT CONTRAST TECHNIQUE: Multidetector CT imaging of the head and cervical spine was performed following the standard protocol without intravenous contrast. Multiplanar CT image reconstructions of the cervical spine were also generated. COMPARISON:  MRI of the brain performed 03/20/2005 FINDINGS: CT HEAD FINDINGS There is no evidence of acute infarction, mass  lesion, or intra- or extra-axial hemorrhage on CT. Bilateral subdural hygromas are again noted. There is underlying mild cortical volume loss. Mild cerebellar atrophy is noted. Scattered periventricular and subcortical white matter change likely reflects small vessel ischemic microangiopathy. The brainstem and fourth ventricle are within normal limits. The basal ganglia are unremarkable in appearance. The cerebral hemispheres demonstrate grossly normal gray-white differentiation. No mass effect or midline shift is seen. There is no evidence of fracture; visualized osseous structures are unremarkable in appearance. The orbits are within normal limits. The paranasal sinuses and mastoid air cells are well-aerated. No significant soft tissue abnormalities are seen. CT CERVICAL SPINE FINDINGS There is no evidence of fracture or subluxation. Vertebral bodies demonstrate normal height and alignment. Mild multilevel disc space narrowing is noted along the lower cervical spine, with scattered anterior and posterior disc osteophyte complexes, and underlying facet disease along the cervical spine. Prevertebral soft tissues are within normal limits. The thyroid gland is unremarkable in appearance. The visualized lung apices are clear. Scattered calcification is noted at the carotid bifurcations bilaterally, with likely moderate right-sided luminal narrowing. IMPRESSION: 1. No evidence of traumatic intracranial injury or fracture. 2. No evidence of fracture or subluxation along the cervical spine. 3. Mild cortical volume loss and scattered small vessel ischemic microangiopathy. 4. Bilateral subdural hygromas again noted. 5. Mild degenerative change along the cervical spine. 6. Scattered calcification at the carotid bifurcations bilaterally, with  likely moderate right-sided luminal narrowing. Carotid ultrasound would be helpful for further evaluation, when and as deemed clinically appropriate. Electronically Signed   By: Garald Balding M.D.   On: 12/29/2015 01:07   Ct Chest W Contrast  Result Date: 12/29/2015 CLINICAL DATA:  80 year old male with acute left chest pain, abdominal and pelvic pain following fall. EXAM: CT CHEST, ABDOMEN, AND PELVIS WITH CONTRAST TECHNIQUE: Multidetector CT imaging of the chest, abdomen and pelvis was performed following the standard protocol during bolus administration of intravenous contrast. CONTRAST:  142mL ISOVUE-300 IOPAMIDOL (ISOVUE-300) INJECTION 61% COMPARISON:  Prior radiographs FINDINGS: CT CHEST FINDINGS Cardiovascular: Cardiomegaly and coronary artery calcifications noted. There is no evidence of thoracic aortic aneurysm. Mediastinum/Nodes: No mediastinal mass, enlarged lymph nodes or pericardial effusion. Lungs/Pleura: A small anterior inferior left pneumothorax identified. Mild dependent and basilar atelectasis noted. No airspace disease, suspicious nodule, mass or consolidation identified. Musculoskeletal: There are acute fractures of the left third through twelfth ribs. Fractures of the lateral fourth through seventh ribs are displaced. Left subcutaneous emphysema is identified. CT ABDOMEN PELVIS FINDINGS Hepatobiliary: Hepatic steatosis identified. The gallbladder is unremarkable. There is no evidence of biliary dilatation. Pancreas: Unremarkable Spleen: Unremarkable Adrenals/Urinary Tract: The kidneys, adrenal glands and bladder are unremarkable. Stomach/Bowel: No bowel obstruction or definite bowel wall thickening. The appendix is normal. Vascular/Lymphatic: Heavy abdominal aortic atherosclerotic calcifications noted without aneurysm. No enlarged lymph nodes identified. Reproductive: Unremarkable Other: No free fluid or pneumoperitoneum. A small to moderate right inguinal hernia containing small bowel noted without bowel obstruction. A small left inguinal hernia containing fat is present. Musculoskeletal: A nondisplaced fracture of the anterior left acetabulum noted. Nondisplaced  fractures of the right L3 and L4 transverse process is noted. IMPRESSION: Acute fractures of the left third through twelfth ribs with small left pneumothorax and left subcutaneous emphysema. Acute nondisplaced fractures of the anterior left acetabulum and right L3 and L4 transverse processes. No evidence of solid or hollow visceral injury. Cardiomegaly and coronary artery disease. Hepatic steatosis. Abdominal aortic atherosclerosis. Critical Value/emergent results were called by telephone at the time of interpretation on 12/29/2015 at 1:08 am to Dr. Delora Fuel , who verbally acknowledged these results. Electronically Signed   By: Margarette Canada M.D.   On: 12/29/2015 01:08   Ct Cervical Spine Wo Contrast  Result Date: 12/29/2015 CLINICAL DATA:  Acute onset of left rib and back pain, status post fall down steps. Concern for head or cervical spine injury. Initial encounter. EXAM: CT HEAD WITHOUT CONTRAST CT CERVICAL SPINE WITHOUT CONTRAST TECHNIQUE: Multidetector CT imaging of the head and cervical spine was performed following the standard protocol without intravenous contrast. Multiplanar CT image reconstructions of the cervical spine were also generated. COMPARISON:  MRI of the brain performed 03/20/2005 FINDINGS: CT HEAD FINDINGS There is no evidence of acute infarction, mass lesion, or intra- or extra-axial hemorrhage on CT. Bilateral subdural hygromas are again noted. There is underlying mild cortical volume loss. Mild cerebellar atrophy is noted. Scattered periventricular and subcortical white matter change likely reflects small vessel ischemic microangiopathy. The brainstem and fourth ventricle are within normal limits. The basal ganglia are unremarkable in appearance. The cerebral hemispheres demonstrate grossly normal gray-white differentiation. No mass effect or midline shift is seen. There is no evidence of fracture; visualized osseous structures are unremarkable in appearance. The orbits are within normal  limits. The paranasal sinuses and mastoid air cells are well-aerated. No significant soft tissue abnormalities are seen. CT CERVICAL SPINE FINDINGS There is no evidence of fracture  or subluxation. Vertebral bodies demonstrate normal height and alignment. Mild multilevel disc space narrowing is noted along the lower cervical spine, with scattered anterior and posterior disc osteophyte complexes, and underlying facet disease along the cervical spine. Prevertebral soft tissues are within normal limits. The thyroid gland is unremarkable in appearance. The visualized lung apices are clear. Scattered calcification is noted at the carotid bifurcations bilaterally, with likely moderate right-sided luminal narrowing. IMPRESSION: 1. No evidence of traumatic intracranial injury or fracture. 2. No evidence of fracture or subluxation along the cervical spine. 3. Mild cortical volume loss and scattered small vessel ischemic microangiopathy. 4. Bilateral subdural hygromas again noted. 5. Mild degenerative change along the cervical spine. 6. Scattered calcification at the carotid bifurcations bilaterally, with likely moderate right-sided luminal narrowing. Carotid ultrasound would be helpful for further evaluation, when and as deemed clinically appropriate. Electronically Signed   By: Garald Balding M.D.   On: 12/29/2015 01:07   Ct Abdomen Pelvis W Contrast  Result Date: 12/29/2015 CLINICAL DATA:  80 year old male with acute left chest pain, abdominal and pelvic pain following fall. EXAM: CT CHEST, ABDOMEN, AND PELVIS WITH CONTRAST TECHNIQUE: Multidetector CT imaging of the chest, abdomen and pelvis was performed following the standard protocol during bolus administration of intravenous contrast. CONTRAST:  134mL ISOVUE-300 IOPAMIDOL (ISOVUE-300) INJECTION 61% COMPARISON:  Prior radiographs FINDINGS: CT CHEST FINDINGS Cardiovascular: Cardiomegaly and coronary artery calcifications noted. There is no evidence of thoracic aortic  aneurysm. Mediastinum/Nodes: No mediastinal mass, enlarged lymph nodes or pericardial effusion. Lungs/Pleura: A small anterior inferior left pneumothorax identified. Mild dependent and basilar atelectasis noted. No airspace disease, suspicious nodule, mass or consolidation identified. Musculoskeletal: There are acute fractures of the left third through twelfth ribs. Fractures of the lateral fourth through seventh ribs are displaced. Left subcutaneous emphysema is identified. CT ABDOMEN PELVIS FINDINGS Hepatobiliary: Hepatic steatosis identified. The gallbladder is unremarkable. There is no evidence of biliary dilatation. Pancreas: Unremarkable Spleen: Unremarkable Adrenals/Urinary Tract: The kidneys, adrenal glands and bladder are unremarkable. Stomach/Bowel: No bowel obstruction or definite bowel wall thickening. The appendix is normal. Vascular/Lymphatic: Heavy abdominal aortic atherosclerotic calcifications noted without aneurysm. No enlarged lymph nodes identified. Reproductive: Unremarkable Other: No free fluid or pneumoperitoneum. A small to moderate right inguinal hernia containing small bowel noted without bowel obstruction. A small left inguinal hernia containing fat is present. Musculoskeletal: A nondisplaced fracture of the anterior left acetabulum noted. Nondisplaced fractures of the right L3 and L4 transverse process is noted. IMPRESSION: Acute fractures of the left third through twelfth ribs with small left pneumothorax and left subcutaneous emphysema. Acute nondisplaced fractures of the anterior left acetabulum and right L3 and L4 transverse processes. No evidence of solid or hollow visceral injury. Cardiomegaly and coronary artery disease. Hepatic steatosis. Abdominal aortic atherosclerosis. Critical Value/emergent results were called by telephone at the time of interpretation on 12/29/2015 at 1:08 am to Dr. Delora Fuel , who verbally acknowledged these results. Electronically Signed   By: Margarette Canada  M.D.   On: 12/29/2015 01:08   Dg Chest Port 1 View  Result Date: 12/29/2015 CLINICAL DATA:  Acute onset of worsening shortness of breath and left-sided chest pain, with decreased O2 saturation. Status post fall down steps. Initial encounter. EXAM: PORTABLE CHEST 1 VIEW COMPARISON:  Chest radiograph and CT of the chest performed 12/28/2015 FINDINGS: The lungs are well-aerated. Mild left-sided atelectasis is again noted. Vascular congestion is seen. There is no evidence of pleural effusion or pneumothorax. The cardiomediastinal silhouette is mildly enlarged. [name] known  numerous left-sided rib fractures are only partially characterized, with scattered soft tissue air along the left chest wall. IMPRESSION: 1. Mild left-sided atelectasis again noted, with underlying numerous left-sided rib fractures and scattered soft tissue air along the left chest wall. 2.  Vascular congestion and mild cardiomegaly. Electronically Signed   By: Garald Balding M.D.   On: 12/29/2015 04:38   Dg Chest Port 1 View  Result Date: 12/29/2015 CLINICAL DATA:  Acute onset of left rib pain and back pain, status post fall down steps. Initial encounter. EXAM: PORTABLE CHEST 1 VIEW COMPARISON:  Chest radiograph performed 09/05/2011 FINDINGS: The lungs are well-aerated. Mild vascular congestion is noted. Mild left basilar atelectasis is seen. There is no evidence of pleural effusion or pneumothorax. The cardiomediastinal silhouette is enlarged. There is a displaced fracture of the left lateral seventh rib, with a small amount of associated soft tissue air along the chest wall. IMPRESSION: 1. Displaced fracture of the left lateral seventh rib, with small amount of associated soft tissue air along the left chest wall. Mild left basilar atelectasis noted. 2. Mild vascular congestion and cardiomegaly noted. Electronically Signed   By: Garald Balding M.D.   On: 12/29/2015 00:10    Anti-infectives: Anti-infectives    None       Assessment/Plan: s/p  The patient would benefit from an epidural catheter, and I have talked with anesthesial about placing one.  No obvious contraindication to epidural.  LOS: 0 days   Kathryne Eriksson. Dahlia Bailiff, MD, FACS [phone] Trauma Surgeon 12/29/2015

## 2015-12-29 NOTE — Anesthesia Preprocedure Evaluation (Signed)
Anesthesia Evaluation  Patient identified by MRN, date of birth, ID band Patient awake    Reviewed: Allergy & Precautions, NPO status , Patient's Chart, lab work & pertinent test results  History of Anesthesia Complications Negative for: history of anesthetic complications  Airway Mallampati: III  TM Distance: >3 FB Neck ROM: Full    Dental  (+) Teeth Intact   Pulmonary COPD,  COPD inhaler, Current Smoker,  Left T3-12 rib fractures with pain limiting respiratory function, stats 88-90 on face mask, from fall    + decreased breath sounds      Cardiovascular hypertension, + CAD and +CHF   Rhythm:Regular  Ef 10-15%, no blood thinners   Neuro/Psych negative neurological ROS  negative psych ROS   GI/Hepatic negative GI ROS, Neg liver ROS,   Endo/Other    Renal/GU negative Renal ROS     Musculoskeletal  (+) Arthritis ,   Abdominal   Peds  Hematology Nl coags in 2011 without active anticoagulants, Plt > 100k   Anesthesia Other Findings   Reproductive/Obstetrics                             Anesthesia Physical Anesthesia Plan  ASA: IV  Anesthesia Plan: Epidural   Post-op Pain Management:    Induction:   Airway Management Planned:   Additional Equipment:   Intra-op Plan:   Post-operative Plan:   Informed Consent: I have reviewed the patients History and Physical, chart, labs and discussed the procedure including the risks, benefits and alternatives for the proposed anesthesia with the patient or authorized representative who has indicated his/her understanding and acceptance.   Dental advisory given  Plan Discussed with: Anesthesiologist  Anesthesia Plan Comments:         Anesthesia Quick Evaluation

## 2015-12-29 NOTE — ED Notes (Signed)
Per Dr Roxanne Mins, pt able to be transferred to [facility],

## 2015-12-29 NOTE — ED Notes (Signed)
Report given to [facility],  

## 2015-12-29 NOTE — Anesthesia Procedure Notes (Signed)
Epidural Patient location during procedure: ICU  Staffing Anesthesiologist: Kema Santaella Performed: anesthesiologist   Preanesthetic Checklist Completed: patient identified, surgical consent, pre-op evaluation, timeout performed, IV checked, risks and benefits discussed and monitors and equipment checked  Epidural Patient position: sitting Prep: ChloraPrep and DuraPrep Patient monitoring: heart rate, cardiac monitor, continuous pulse ox and blood pressure Approach: midline Location: thoracic (1-12) Injection technique: LOR saline  Needle:  Needle type: Tuohy  Needle gauge: 17 G Needle length: 9 cm Needle insertion depth: 6 cm Catheter type: closed end flexible Catheter size: 19 Gauge Catheter at skin depth: 13 cm Test dose: negative and 1.5% lidocaine with Epi 1:200 K  Assessment Events: blood not aspirated, injection not painful, no injection resistance, negative IV test and no paresthesia  Additional Notes Reason for block:procedure for pain

## 2015-12-29 NOTE — ED Notes (Signed)
MD at bedside. 

## 2015-12-29 NOTE — Progress Notes (Signed)
Patient unable to void post epidural. Bladder scan indicated 700+ cc urine. MD aware. Order received for I&O cath x 1. Sterile I&O cath performed by Romelle Starcher RN w/ Amy NT assisting. Returned 650 cc amber urine. Post procedure bladder scan 0cc. Patient reported that he did not have the sensation to void. Will continue to monitor.

## 2015-12-29 NOTE — ED Notes (Addendum)
Pt resting at present time, resp not as labored, pulse ox 90-93% on 4lpm via [place]< Dr Roxanne Mins aware, no additional orders given,

## 2015-12-29 NOTE — ED Notes (Signed)
[facility] here to transport pt,  

## 2015-12-29 NOTE — Care Management Important Message (Signed)
Important Message  Patient Details  Name: Chad Rogers MRN: [id number] Date of Birth: 1935-08-12   Medicare Important Message Given:  Yes    [name], Leroy Sea 12/29/2015, 1:27 PM

## 2015-12-29 NOTE — ED Notes (Signed)
CRITICAL VALUE ALERT  Critical value received:  Trop 0.04  Date of notification:  12/29/2015  Time of notification:  00:12  Critical value read back: yes  Nurse who received alert:  Luis Abed   MD notified (1st page):  Dr Roxanne Mins  Time of first page:  00:12  MD notified (2nd page):  Time of second page:  Responding MD:  Dr Roxanne Mins   Time MD responded:  00:12

## 2015-12-29 NOTE — ED Notes (Signed)
Pt returned from xray, family at bedside, updated on plan of care,

## 2015-12-29 NOTE — ED Notes (Signed)
Pt pulse ox decreased to 88% on 4 lpm oxygen via , Dr Roxanne Mins notified, advised to increase oxygen and to repeat chest xray,

## 2015-12-29 NOTE — Progress Notes (Addendum)
Patient hypotensive post epidural placement. Dr. Ermalene Postin aware and addressed with medication. Also ordered albumin which is currently infusing. BP trended back up to SBP 87 but then decreased again as low as 50s/30s. Patient lightheaded, but alert, oriented, and denies pain. Patient reclined. Neo-synephrine ordered. Dr. Hulen Skains at bedside and updated. Albumin 500 cc infusing.  Will continue to monitor.   Update: BP improving with neo infusion and pt denies being lightheaded. Anesthesiologist updated.

## 2015-12-29 NOTE — ED Notes (Signed)
Pt and family updated,  

## 2015-12-29 NOTE — H&P (Signed)
Chad Rogers is an 80 y.o. male.   Chief Complaint: Left-sided rib pain after fall HPI: Chad Rogers was at home when he heard a noise in the back yard. He went on the back step to see what was going on. It had been raining and was wet. He slipped and fell onto his left side. No loss of consciousness. He immediately had left sided rib pain. His family encouraged him to be evaluated as he was initially reluctant. He was taken by EMS to [facility]. Workup there demonstrated multiple left-sided rib fractures 3-12 with small pneumothorax, L3-4 transverse process fractures, and left acetabular fracture. I accepted him in transfer for admission to the trauma service. He complains of left-sided rib pain at this time. His granddaughter who accompanies him reports that he drinks about 12 beers daily and smokes 2-3 packs of cigarettes.  Past Medical History:  Diagnosis Date  . Alcohol abuse    family reports that [pt will drink at least 6 beers or more a day,   . Allergic rhinitis   . Cardiomyopathy [id number]   Acute systolic CHF at presentation; 05/2009 cardiac cath- 06/01/09  non obst coronary artery disease with EF 25%  . CHF (congestive heart failure) ([place])   . COPD (chronic obstructive pulmonary disease) ([place])   . Coronary artery disease   . Hypertension   . Osteoarthritis   . Psoriasis   . Pulmonary fibrosis ([place])    12/2004 with basilar fibrotic changes;  PFT's 06/07/09 FEV1 1.98 (68%) raio 53 no better after B2,  DLC0105%  . Skin cancer   . Tobacco abuse    100 pack years; quit    Past Surgical History:  Procedure Laterality Date  . HAND SURGERY    . INGUINAL HERNIA REPAIR    . ROTATOR CUFF REPAIR      Family History  Problem Relation Age of Onset  . Cancer Mother 35    gynecologic  . Aneurysm Father 69    cerebral aneurysm  . Asthma Maternal Grandmother    Social History:  reports that he has been smoking Cigarettes.  He has a 100.00 pack-year smoking history. He has quit  using smokeless tobacco. He reports that he drinks about 17.5 oz of alcohol per week . His drug history is not on file.  Allergies: No Known Allergies  Medications Prior to Admission  Medication Sig Dispense Refill  . albuterol (PROVENTIL HFA) 108 (90 BASE) MCG/ACT inhaler Inhale 2 puffs into the lungs every 6 (six) hours as needed.      . ALPRAZolam (XANAX) 0.5 MG tablet Take 0.5 mg by mouth at bedtime as needed for sleep.    Marland Kitchen aspirin 81 MG tablet Take 81 mg by mouth daily.      . carvedilol (COREG) 6.25 MG tablet Take 1 tablet by mouth   twice a day with a meal 180 tablet 0  . COMBIVENT RESPIMAT 20-100 MCG/ACT AERS respimat     . furosemide (LASIX) 40 MG tablet Take 1 tablet by mouth  daily 90 tablet 1  . lisinopril (PRINIVIL,ZESTRIL) 20 MG tablet Take 1 tablet by mouth  daily 90 tablet 0  . potassium chloride SA (K-DUR,KLOR-CON) 20 MEQ tablet Take 1 tablet by mouth  daily 90 tablet 1  . sertraline (ZOLOFT) 50 MG tablet Take 50 mg by mouth daily.    Marland Kitchen spironolactone (ALDACTONE) 25 MG tablet Take 0.5 tablets (12.5 mg total) by mouth daily. 90 tablet 1  . zolpidem (AMBIEN) 10  MG tablet Take 10 mg by mouth at bedtime as needed for sleep.      Results for orders placed or performed during the hospital encounter of 12/28/15 (from the past 48 hour(s))  Comprehensive metabolic panel     Status: Abnormal   Collection Time: 12/28/15 11:34 PM  Result Value Ref Range   Sodium 127 (L) 135 - 145 mmol/L   Potassium 4.2 3.5 - 5.1 mmol/L   Chloride 91 (L) 101 - 111 mmol/L   CO2 22 22 - 32 mmol/L   Glucose, Bld 116 (H) 65 - 99 mg/dL   BUN 11 6 - 20 mg/dL   Creatinine, Ser 0.90 0.61 - 1.24 mg/dL   Calcium 8.7 (L) 8.9 - 10.3 mg/dL   Total Protein 7.2 6.5 - 8.1 g/dL   Albumin 4.2 3.5 - 5.0 g/dL   AST 27 15 - 41 U/L   ALT 18 17 - 63 U/L   Alkaline Phosphatase 73 38 - 126 U/L   Total Bilirubin 0.8 0.3 - 1.2 mg/dL   GFR calc non Af Amer >60 >60 mL/min   GFR calc Af Amer >60 >60 mL/min    Comment:  (NOTE) The eGFR has been calculated using the CKD EPI equation. This calculation has not been validated in all clinical situations. eGFR's persistently <60 mL/min signify possible Chronic Kidney Disease.    Anion gap 14 5 - 15  CBC with Differential     Status: Abnormal   Collection Time: 12/28/15 11:34 PM  Result Value Ref Range   WBC 16.7 (H) 4.0 - 10.5 K/uL   RBC 4.41 4.22 - 5.81 MIL/uL   Hemoglobin 15.3 13.0 - 17.0 g/dL   HCT 43.8 39.0 - 52.0 %   MCV 99.3 78.0 - 100.0 fL   MCH 34.7 (H) 26.0 - 34.0 pg   MCHC 34.9 30.0 - 36.0 g/dL   RDW 13.6 11.5 - 15.5 %   Platelets 202 150 - 400 K/uL   Neutrophils Relative % 77 %   Neutro Abs 13.0 (H) 1.7 - 7.7 K/uL   Lymphocytes Relative 17 %   Lymphs Abs 2.8 0.7 - 4.0 K/uL   Monocytes Relative 5 %   Monocytes Absolute 0.8 0.1 - 1.0 K/uL   Eosinophils Relative 1 %   Eosinophils Absolute 0.1 0.0 - 0.7 K/uL   Basophils Relative 0 %   Basophils Absolute 0.1 0.0 - 0.1 K/uL  Troponin I     Status: Abnormal   Collection Time: 12/28/15 11:34 PM  Result Value Ref Range   Troponin I 0.04 (HH) <0.03 ng/mL    Comment: CRITICAL RESULT CALLED TO, READ BACK BY AND VERIFIED WITH: POINDEXTER M AT 0010 ON [id number] BY FORSYTH K   Lipase, blood     Status: None   Collection Time: 12/28/15 11:34 PM  Result Value Ref Range   Lipase 31 11 - 51 U/L  I-Stat CG4 Lactic Acid, ED     Status: Abnormal   Collection Time: 12/28/15 11:40 PM  Result Value Ref Range   Lactic Acid, Venous 5.63 (HH) 0.5 - 1.9 mmol/L   Comment NOTIFIED PHYSICIAN   I-stat chem 8, ed     Status: Abnormal   Collection Time: 12/28/15 11:50 PM  Result Value Ref Range   Sodium 128 (L) 135 - 145 mmol/L   Potassium 4.4 3.5 - 5.1 mmol/L   Chloride 92 (L) 101 - 111 mmol/L   BUN 10 6 - 20 mg/dL   Creatinine, Ser 0.80 0.61 -  1.24 mg/dL   Glucose, Bld 110 (H) 65 - 99 mg/dL   Calcium, Ion 1.09 (L) 1.12 - 1.23 mmol/L   TCO2 21 0 - 100 mmol/L   Hemoglobin 15.6 13.0 - 17.0 g/dL   HCT 46.0  39.0 - 52.0 %   Ct Head Wo Contrast  Result Date: 12/29/2015 CLINICAL DATA:  Acute onset of left rib and back pain, status post fall down steps. Concern for head or cervical spine injury. Initial encounter. EXAM: CT HEAD WITHOUT CONTRAST CT CERVICAL SPINE WITHOUT CONTRAST TECHNIQUE: Multidetector CT imaging of the head and cervical spine was performed following the standard protocol without intravenous contrast. Multiplanar CT image reconstructions of the cervical spine were also generated. COMPARISON:  MRI of the brain performed 03/20/2005 FINDINGS: CT HEAD FINDINGS There is no evidence of acute infarction, mass lesion, or intra- or extra-axial hemorrhage on CT. Bilateral subdural hygromas are again noted. There is underlying mild cortical volume loss. Mild cerebellar atrophy is noted. Scattered periventricular and subcortical white matter change likely reflects small vessel ischemic microangiopathy. The brainstem and fourth ventricle are within normal limits. The basal ganglia are unremarkable in appearance. The cerebral hemispheres demonstrate grossly normal gray-white differentiation. No mass effect or midline shift is seen. There is no evidence of fracture; visualized osseous structures are unremarkable in appearance. The orbits are within normal limits. The paranasal sinuses and mastoid air cells are well-aerated. No significant soft tissue abnormalities are seen. CT CERVICAL SPINE FINDINGS There is no evidence of fracture or subluxation. Vertebral bodies demonstrate normal height and alignment. Mild multilevel disc space narrowing is noted along the lower cervical spine, with scattered anterior and posterior disc osteophyte complexes, and underlying facet disease along the cervical spine. Prevertebral soft tissues are within normal limits. The thyroid gland is unremarkable in appearance. The visualized lung apices are clear. Scattered calcification is noted at the carotid bifurcations bilaterally, with  likely moderate right-sided luminal narrowing. IMPRESSION: 1. No evidence of traumatic intracranial injury or fracture. 2. No evidence of fracture or subluxation along the cervical spine. 3. Mild cortical volume loss and scattered small vessel ischemic microangiopathy. 4. Bilateral subdural hygromas again noted. 5. Mild degenerative change along the cervical spine. 6. Scattered calcification at the carotid bifurcations bilaterally, with likely moderate right-sided luminal narrowing. Carotid ultrasound would be helpful for further evaluation, when and as deemed clinically appropriate. Electronically Signed   By: Garald Balding M.D.   On: 12/29/2015 01:07   Ct Chest W Contrast  Result Date: 12/29/2015 CLINICAL DATA:  80 year old male with acute left chest pain, abdominal and pelvic pain following fall. EXAM: CT CHEST, ABDOMEN, AND PELVIS WITH CONTRAST TECHNIQUE: Multidetector CT imaging of the chest, abdomen and pelvis was performed following the standard protocol during bolus administration of intravenous contrast. CONTRAST:  138m ISOVUE-300 IOPAMIDOL (ISOVUE-300) INJECTION 61% COMPARISON:  Prior radiographs FINDINGS: CT CHEST FINDINGS Cardiovascular: Cardiomegaly and coronary artery calcifications noted. There is no evidence of thoracic aortic aneurysm. Mediastinum/Nodes: No mediastinal mass, enlarged lymph nodes or pericardial effusion. Lungs/Pleura: A small anterior inferior left pneumothorax identified. Mild dependent and basilar atelectasis noted. No airspace disease, suspicious nodule, mass or consolidation identified. Musculoskeletal: There are acute fractures of the left third through twelfth ribs. Fractures of the lateral fourth through seventh ribs are displaced. Left subcutaneous emphysema is identified. CT ABDOMEN PELVIS FINDINGS Hepatobiliary: Hepatic steatosis identified. The gallbladder is unremarkable. There is no evidence of biliary dilatation. Pancreas: Unremarkable Spleen: Unremarkable  Adrenals/Urinary Tract: The kidneys, adrenal glands and bladder are unremarkable.  Stomach/Bowel: No bowel obstruction or definite bowel wall thickening. The appendix is normal. Vascular/Lymphatic: Heavy abdominal aortic atherosclerotic calcifications noted without aneurysm. No enlarged lymph nodes identified. Reproductive: Unremarkable Other: No free fluid or pneumoperitoneum. A small to moderate right inguinal hernia containing small bowel noted without bowel obstruction. A small left inguinal hernia containing fat is present. Musculoskeletal: A nondisplaced fracture of the anterior left acetabulum noted. Nondisplaced fractures of the right L3 and L4 transverse process is noted. IMPRESSION: Acute fractures of the left third through twelfth ribs with small left pneumothorax and left subcutaneous emphysema. Acute nondisplaced fractures of the anterior left acetabulum and right L3 and L4 transverse processes. No evidence of solid or hollow visceral injury. Cardiomegaly and coronary artery disease. Hepatic steatosis. Abdominal aortic atherosclerosis. Critical Value/emergent results were called by telephone at the time of interpretation on 12/29/2015 at 1:08 am to Dr. Delora Fuel , who verbally acknowledged these results. Electronically Signed   By: Margarette Canada M.D.   On: 12/29/2015 01:08   Ct Cervical Spine Wo Contrast  Result Date: 12/29/2015 CLINICAL DATA:  Acute onset of left rib and back pain, status post fall down steps. Concern for head or cervical spine injury. Initial encounter. EXAM: CT HEAD WITHOUT CONTRAST CT CERVICAL SPINE WITHOUT CONTRAST TECHNIQUE: Multidetector CT imaging of the head and cervical spine was performed following the standard protocol without intravenous contrast. Multiplanar CT image reconstructions of the cervical spine were also generated. COMPARISON:  MRI of the brain performed 03/20/2005 FINDINGS: CT HEAD FINDINGS There is no evidence of acute infarction, mass lesion, or intra- or  extra-axial hemorrhage on CT. Bilateral subdural hygromas are again noted. There is underlying mild cortical volume loss. Mild cerebellar atrophy is noted. Scattered periventricular and subcortical white matter change likely reflects small vessel ischemic microangiopathy. The brainstem and fourth ventricle are within normal limits. The basal ganglia are unremarkable in appearance. The cerebral hemispheres demonstrate grossly normal gray-white differentiation. No mass effect or midline shift is seen. There is no evidence of fracture; visualized osseous structures are unremarkable in appearance. The orbits are within normal limits. The paranasal sinuses and mastoid air cells are well-aerated. No significant soft tissue abnormalities are seen. CT CERVICAL SPINE FINDINGS There is no evidence of fracture or subluxation. Vertebral bodies demonstrate normal height and alignment. Mild multilevel disc space narrowing is noted along the lower cervical spine, with scattered anterior and posterior disc osteophyte complexes, and underlying facet disease along the cervical spine. Prevertebral soft tissues are within normal limits. The thyroid gland is unremarkable in appearance. The visualized lung apices are clear. Scattered calcification is noted at the carotid bifurcations bilaterally, with likely moderate right-sided luminal narrowing. IMPRESSION: 1. No evidence of traumatic intracranial injury or fracture. 2. No evidence of fracture or subluxation along the cervical spine. 3. Mild cortical volume loss and scattered small vessel ischemic microangiopathy. 4. Bilateral subdural hygromas again noted. 5. Mild degenerative change along the cervical spine. 6. Scattered calcification at the carotid bifurcations bilaterally, with likely moderate right-sided luminal narrowing. Carotid ultrasound would be helpful for further evaluation, when and as deemed clinically appropriate. Electronically Signed   By: Garald Balding M.D.   On:  12/29/2015 01:07   Ct Abdomen Pelvis W Contrast  Result Date: 12/29/2015 CLINICAL DATA:  80 year old male with acute left chest pain, abdominal and pelvic pain following fall. EXAM: CT CHEST, ABDOMEN, AND PELVIS WITH CONTRAST TECHNIQUE: Multidetector CT imaging of the chest, abdomen and pelvis was performed following the standard protocol during bolus administration  of intravenous contrast. CONTRAST:  162m ISOVUE-300 IOPAMIDOL (ISOVUE-300) INJECTION 61% COMPARISON:  Prior radiographs FINDINGS: CT CHEST FINDINGS Cardiovascular: Cardiomegaly and coronary artery calcifications noted. There is no evidence of thoracic aortic aneurysm. Mediastinum/Nodes: No mediastinal mass, enlarged lymph nodes or pericardial effusion. Lungs/Pleura: A small anterior inferior left pneumothorax identified. Mild dependent and basilar atelectasis noted. No airspace disease, suspicious nodule, mass or consolidation identified. Musculoskeletal: There are acute fractures of the left third through twelfth ribs. Fractures of the lateral fourth through seventh ribs are displaced. Left subcutaneous emphysema is identified. CT ABDOMEN PELVIS FINDINGS Hepatobiliary: Hepatic steatosis identified. The gallbladder is unremarkable. There is no evidence of biliary dilatation. Pancreas: Unremarkable Spleen: Unremarkable Adrenals/Urinary Tract: The kidneys, adrenal glands and bladder are unremarkable. Stomach/Bowel: No bowel obstruction or definite bowel wall thickening. The appendix is normal. Vascular/Lymphatic: Heavy abdominal aortic atherosclerotic calcifications noted without aneurysm. No enlarged lymph nodes identified. Reproductive: Unremarkable Other: No free fluid or pneumoperitoneum. A small to moderate right inguinal hernia containing small bowel noted without bowel obstruction. A small left inguinal hernia containing fat is present. Musculoskeletal: A nondisplaced fracture of the anterior left acetabulum noted. Nondisplaced fractures of the  right L3 and L4 transverse process is noted. IMPRESSION: Acute fractures of the left third through twelfth ribs with small left pneumothorax and left subcutaneous emphysema. Acute nondisplaced fractures of the anterior left acetabulum and right L3 and L4 transverse processes. No evidence of solid or hollow visceral injury. Cardiomegaly and coronary artery disease. Hepatic steatosis. Abdominal aortic atherosclerosis. Critical Value/emergent results were called by telephone at the time of interpretation on 12/29/2015 at 1:08 am to Dr. DDelora Fuel, who verbally acknowledged these results. Electronically Signed   By: JMargarette CanadaM.D.   On: 12/29/2015 01:08   Dg Chest Port 1 View  Result Date: 12/29/2015 CLINICAL DATA:  Acute onset of worsening shortness of breath and left-sided chest pain, with decreased O2 saturation. Status post fall down steps. Initial encounter. EXAM: PORTABLE CHEST 1 VIEW COMPARISON:  Chest radiograph and CT of the chest performed 12/28/2015 FINDINGS: The lungs are well-aerated. Mild left-sided atelectasis is again noted. Vascular congestion is seen. There is no evidence of pleural effusion or pneumothorax. The cardiomediastinal silhouette is mildly enlarged. [name] known numerous left-sided rib fractures are only partially characterized, with scattered soft tissue air along the left chest wall. IMPRESSION: 1. Mild left-sided atelectasis again noted, with underlying numerous left-sided rib fractures and scattered soft tissue air along the left chest wall. 2.  Vascular congestion and mild cardiomegaly. Electronically Signed   By: JGarald BaldingM.D.   On: 12/29/2015 04:38   Dg Chest Port 1 View  Result Date: 12/29/2015 CLINICAL DATA:  Acute onset of left rib pain and back pain, status post fall down steps. Initial encounter. EXAM: PORTABLE CHEST 1 VIEW COMPARISON:  Chest radiograph performed 09/05/2011 FINDINGS: The lungs are well-aerated. Mild vascular congestion is noted. Mild left  basilar atelectasis is seen. There is no evidence of pleural effusion or pneumothorax. The cardiomediastinal silhouette is enlarged. There is a displaced fracture of the left lateral seventh rib, with a small amount of associated soft tissue air along the chest wall. IMPRESSION: 1. Displaced fracture of the left lateral seventh rib, with small amount of associated soft tissue air along the left chest wall. Mild left basilar atelectasis noted. 2. Mild vascular congestion and cardiomegaly noted. Electronically Signed   By: JGarald BaldingM.D.   On: 12/29/2015 00:10    Review of Systems  Constitutional:  Negative for fever.  HENT: Negative.   Eyes: Negative for blurred vision.  Respiratory: Positive for shortness of breath and wheezing.   Cardiovascular: Positive for chest pain and palpitations.  Gastrointestinal: Negative for abdominal pain, nausea and vomiting.  Genitourinary: Negative.   Musculoskeletal:       Some left hip pain  Skin:       Reports several skin cancers  Neurological: Negative for speech change and focal weakness.  Endo/Heme/Allergies: Negative.   Psychiatric/Behavioral: Negative.     Blood pressure 121/83, pulse 83, temperature 97.7 F (36.5 C), temperature source Axillary, resp. rate (!) 30, height 5' 8"  (1.727 m), weight 74.8 kg (165 lb), SpO2 90 %. Physical Exam  Constitutional: He appears well-developed and well-nourished. No distress.  HENT:  Head: Normocephalic.    Right Ear: Hearing and external ear normal.  Left Ear: Hearing and external ear normal.  Nose: No sinus tenderness or nasal deformity.  Mouth/Throat: Uvula is midline, oropharynx is clear and moist and mucous membranes are normal.  Eyes: EOM are normal. Pupils are equal, round, and reactive to light.  Erythema along the lower left eyelid  Neck: Normal range of motion. No tracheal deviation present.  No posterior midline tenderness  Cardiovascular: Normal rate, regular rhythm and normal heart  sounds.   Frequent PVCs  Respiratory: No stridor. No respiratory distress. He has wheezes. He has no rales. He exhibits tenderness.  Left-sided rib tenderness  GI: Soft. He exhibits no distension. There is no tenderness. There is no rebound and no guarding.  Musculoskeletal: He exhibits no deformity.  Some discomfort with movement of left lower extremity but no deformity, partial amputation right index finger  Neurological: He is alert. He displays no atrophy and no tremor. He exhibits normal muscle tone. He displays no seizure activity. GCS eye subscore is 4. GCS verbal subscore is 5. GCS motor subscore is 6.  Left lower extremity movement limited somewhat by pain  Skin: Skin is warm.  Scabbed lesion left forearm  Psychiatric: He has a normal mood and affect.     Assessment/Plan Fall Left sided rib fractures 3 through 12 with small pneumothorax - pain control and pulmonary toilet, will ask anesthesia to evaluate later this morning for possible epidural catheter. If his respiratory status worsens which is likely, he is willing to go on the ventilator. L3 and L4 transverse process fracture Left acetabular fracture - Will consult Dr. Marcelino Scot this AM COPD CHF ETOH abuse - CIWA  Admit to ICU I spoke with his granddaughter.   Zenovia Jarred, MD 12/29/2015, 5:25 AM

## 2015-12-29 NOTE — Progress Notes (Signed)
Dr. Grandville Silos notified about patient's increased labor of breathing and shallow breaths. Respiratory notified to administer breathing treatment. Will continue to monitor. Thayer Ohm D

## 2015-12-30 ENCOUNTER — Inpatient Hospital Stay (HOSPITAL_COMMUNITY): Payer: Medicare Other

## 2015-12-30 ENCOUNTER — Inpatient Hospital Stay (HOSPITAL_COMMUNITY): Payer: Medicare Other | Admitting: Certified Registered Nurse Anesthetist

## 2015-12-30 LAB — CBC
HCT: 37.2 % — ABNORMAL LOW (ref 39.0–52.0)
Hemoglobin: 12.7 g/dL — ABNORMAL LOW (ref 13.0–17.0)
MCH: 34 pg (ref 26.0–34.0)
MCHC: 34.1 g/dL (ref 30.0–36.0)
MCV: 99.5 fL (ref 78.0–100.0)
PLATELETS: 230 10*3/uL (ref 150–400)
RBC: 3.74 MIL/uL — AB (ref 4.22–5.81)
RDW: 13.9 % (ref 11.5–15.5)
WBC: 13.4 10*3/uL — AB (ref 4.0–10.5)

## 2015-12-30 LAB — COMPREHENSIVE METABOLIC PANEL
ALK PHOS: 54 U/L (ref 38–126)
ALT: 31 U/L (ref 17–63)
ANION GAP: 7 (ref 5–15)
AST: 49 U/L — ABNORMAL HIGH (ref 15–41)
Albumin: 3.3 g/dL — ABNORMAL LOW (ref 3.5–5.0)
BILIRUBIN TOTAL: 1.8 mg/dL — AB (ref 0.3–1.2)
BUN: 16 mg/dL (ref 6–20)
CALCIUM: 8 mg/dL — AB (ref 8.9–10.3)
CO2: 23 mmol/L (ref 22–32)
CREATININE: 1.1 mg/dL (ref 0.61–1.24)
Chloride: 84 mmol/L — ABNORMAL LOW (ref 101–111)
GFR calc non Af Amer: >60 mL/min (ref >60)
Glucose, Bld: 231 mg/dL — ABNORMAL HIGH (ref 65–99)
Potassium: 5.4 mmol/L — ABNORMAL HIGH (ref 3.5–5.1)
Sodium: 114 mmol/L — CL (ref 135–145)
TOTAL PROTEIN: 5.6 g/dL — AB (ref 6.5–8.1)

## 2015-12-30 LAB — BASIC METABOLIC PANEL
Anion gap: 12 (ref 5–15)
BUN: 12 mg/dL (ref 6–20)
CO2: 17 mmol/L — ABNORMAL LOW (ref 22–32)
CREATININE: 0.74 mg/dL (ref 0.61–1.24)
Calcium: 8.7 mg/dL — ABNORMAL LOW (ref 8.9–10.3)
Chloride: 91 mmol/L — ABNORMAL LOW (ref 101–111)
GFR calc Af Amer: >60 mL/min (ref >60)
GLUCOSE: 154 mg/dL — AB (ref 65–99)
Potassium: 5.6 mmol/L — ABNORMAL HIGH (ref 3.5–5.1)
SODIUM: 120 mmol/L — AB (ref 135–145)

## 2015-12-30 LAB — POCT I-STAT 3, ART BLOOD GAS (G3+)
ACID-BASE DEFICIT: 6 mmol/L — AB (ref 0.0–2.0)
BICARBONATE: 19.6 meq/L — AB (ref 20.0–24.0)
O2 SAT: 98 %
PCO2 ART: 37.9 mmHg (ref 35.0–45.0)
PO2 ART: 106 mmHg — AB (ref 80.0–100.0)
Patient temperature: 98.5
TCO2: 21 mmol/L (ref 0–100)
pH, Arterial: 7.323 — ABNORMAL LOW (ref 7.350–7.450)

## 2015-12-30 LAB — TRIGLYCERIDES: Triglycerides: 38 mg/dL (ref <150)

## 2015-12-30 MED ORDER — FLUMAZENIL 0.5 MG/5ML IV SOLN
INTRAVENOUS | Status: AC
  Filled 2015-12-30: qty 5

## 2015-12-30 MED ORDER — DEXTROSE-NACL 5-0.9 % IV SOLN
INTRAVENOUS | Status: DC
  Administered 2015-12-30: 1000 mL via INTRAVENOUS
  Administered 2015-12-31 – 2016-01-04 (×5): via INTRAVENOUS

## 2015-12-30 MED ORDER — ATROPINE SULFATE 1 MG/ML IJ SOLN: 0.4000 mg | Freq: Once | INTRAMUSCULAR | Status: DC

## 2015-12-30 MED ORDER — FUROSEMIDE 10 MG/ML IJ SOLN: 40.0000 mg | Freq: Once | INTRAMUSCULAR | Status: DC

## 2015-12-30 MED ORDER — ATROPINE SULFATE 1 MG/10ML IJ SOSY
PREFILLED_SYRINGE | INTRAMUSCULAR | Status: AC
  Administered 2015-12-30: 0.5 mg
  Filled 2015-12-30: qty 10

## 2015-12-30 MED ORDER — ETOMIDATE 2 MG/ML IV SOLN
INTRAVENOUS | Status: DC | PRN
  Administered 2015-12-30: 10 mg via INTRAVENOUS

## 2015-12-30 MED ORDER — SUCCINYLCHOLINE CHLORIDE 20 MG/ML IJ SOLN
INTRAMUSCULAR | Status: DC | PRN
  Administered 2015-12-30: 100 mg via INTRAVENOUS

## 2015-12-30 MED ORDER — ATROPINE SULFATE 1 MG/ML IJ SOLN
INTRAMUSCULAR | Status: AC
  Administered 2015-12-30: 0.5 mg
  Filled 2015-12-30: qty 1

## 2015-12-30 MED ORDER — PHENYLEPHRINE HCL 10 MG/ML IJ SOLN
0.0000 ug/min | INTRAVENOUS | Status: DC
  Administered 2015-12-30 – 2015-12-31 (×2): 360 ug/min via INTRAVENOUS
  Filled 2015-12-30 (×3): qty 4

## 2015-12-30 MED ORDER — FLUMAZENIL 0.5 MG/5ML IV SOLN: 0.2000 mg | Freq: Once | INTRAVENOUS | Status: DC

## 2015-12-30 MED ORDER — FUROSEMIDE 10 MG/ML IJ SOLN: 40.0000 mg | Freq: Every day | INTRAMUSCULAR | Status: DC

## 2015-12-30 MED ORDER — SODIUM CHLORIDE 0.9 % IV BOLUS (SEPSIS)
1000.0000 mL | Freq: Once | INTRAVENOUS | Status: AC
  Administered 2015-12-30: 1000 mL via INTRAVENOUS

## 2015-12-30 MED ORDER — ANTISEPTIC ORAL RINSE SOLUTION (CORINZ)
7.0000 mL | Freq: Four times a day (QID) | OROMUCOSAL | Status: DC
  Administered 2015-12-31 – 2016-01-01 (×4): 7 mL via OROMUCOSAL

## 2015-12-30 MED ORDER — CHLORHEXIDINE GLUCONATE 0.12% ORAL RINSE (MEDLINE KIT)
15.0000 mL | Freq: Two times a day (BID) | OROMUCOSAL | Status: DC
  Administered 2015-12-30 – 2015-12-31 (×2): 15 mL via OROMUCOSAL

## 2015-12-30 MED ORDER — LIDOCAINE HCL (PF) 1 % IJ SOLN
INTRAMUSCULAR | Status: AC
  Administered 2015-12-30: 30 mL
  Filled 2015-12-30: qty 30

## 2015-12-30 MED ORDER — PROPOFOL 1000 MG/100ML IV EMUL
5.0000 ug/kg/min | INTRAVENOUS | Status: DC
  Administered 2015-12-30: 5 ug/kg/min via INTRAVENOUS
  Filled 2015-12-30: qty 100

## 2015-12-30 MED ORDER — SODIUM CHLORIDE 0.9% FLUSH
10.0000 mL | INTRAVENOUS | Status: DC | PRN
  Administered 2015-12-31: 10 mL
  Filled 2015-12-30: qty 40

## 2015-12-30 MED ORDER — SODIUM CHLORIDE 0.9% FLUSH
10.0000 mL | Freq: Two times a day (BID) | INTRAVENOUS | Status: DC
  Administered 2015-12-30 – 2016-01-04 (×11): 10 mL
  Administered 2016-01-05: 20 mL
  Administered 2016-01-05 – 2016-01-10 (×7): 10 mL
  Administered 2016-01-11 – 2016-01-12 (×2): 20 mL
  Administered 2016-01-12 – 2016-01-15 (×6): 10 mL
  Administered 2016-01-16: 40 mL
  Administered 2016-01-16 – 2016-01-17 (×3): 10 mL
  Administered 2016-01-18: 40 mL
  Administered 2016-01-18 – 2016-01-19 (×2): 10 mL

## 2015-12-30 NOTE — Progress Notes (Signed)
Peripherally Inserted Central Catheter/Midline Placement  The IV Nurse has discussed with the patient and/or persons authorized to consent for the patient, the purpose of this procedure and the potential benefits and risks involved with this procedure.  The benefits include less needle sticks, lab draws from the catheter and patient may be discharged home with the catheter.  Risks include, but not limited to, infection, bleeding, blood clot (thrombus formation), and puncture of an artery; nerve damage and irregular heat beat.  Alternatives to this procedure were also discussed.  PICC/Midline Placement Documentation  PICC Triple Lumen [id number] PICC Right Basilic 38 cm 0 cm (Active)  Indication for Insertion or Continuance of Line Vasoactive infusions 12/30/2015  3:21 PM  Exposed Catheter (cm) 0 cm 12/30/2015  3:21 PM  Site Assessment Clean;Dry;Intact 12/30/2015  3:21 PM  Lumen #1 Status Flushed;Saline locked;Blood return noted 12/30/2015  3:21 PM  Lumen #2 Status Flushed;Saline locked;Blood return noted 12/30/2015  3:21 PM  Lumen #3 Status Flushed;Saline locked;Blood return noted 12/30/2015  3:21 PM  Dressing Type Transparent 12/30/2015  3:21 PM  Dressing Status Clean;Dry;Intact 12/30/2015  3:21 PM  Dressing Change Due 01/06/16 12/30/2015  3:21 PM       Gordan Payment 12/30/2015, 3:24 PM

## 2015-12-30 NOTE — Progress Notes (Signed)
Pt with larger left PTX Will need CT on PPV Family aware of this and his condition. Risk of bleeding ,  Infection,  Organ injury With no CT may get tension PTX.

## 2015-12-30 NOTE — Progress Notes (Signed)
ABG collected  

## 2015-12-30 NOTE — Progress Notes (Signed)
Pt received CIWA protocol meds at 6 pm and became more somnolent Had episode of bradycardia and could not arouse  Intubated and epidural shut off More stable now

## 2015-12-30 NOTE — Anesthesia Procedure Notes (Signed)
Procedure Name: Intubation Date/Time: 12/30/2015 8:45 PM Performed by: Hollie Salk Z Pre-anesthesia Checklist: Patient identified, Emergency Drugs available, Suction available and Patient being monitored Patient Re-evaluated:Patient Re-evaluated prior to inductionOxygen Delivery Method: Ambu bag Preoxygenation: Pre-oxygenation with 100% oxygen Intubation Type: IV induction, Rapid sequence and Cricoid Pressure applied Laryngoscope Size: Mac and 3 Grade View: Grade I Tube type: Subglottic suction tube Number of attempts: 1 Airway Equipment and Method: Stylet Placement Confirmation: ETT inserted through vocal cords under direct vision Secured at: 23 cm Tube secured with: Tape Dental Injury: Teeth and Oropharynx as per pre-operative assessment

## 2015-12-30 NOTE — Progress Notes (Signed)
Pt is stable at this time, family at bedside.

## 2015-12-30 NOTE — Progress Notes (Signed)
Procedures CRITICAL VALUE ALERT  Critical value received: Na+ 114  Date of notification:  12/30/2015   Time of notification:  2210  Critical value read back:Yes.    Nurse who received alert:  Jamelle Haring, RN  MD notified (1st page):  Dr. Brantley Stage  Time of first page:  2224   MD notified (2nd page):  Time of second page:  Responding MD:  Dr. Brantley Stage   Time MD responded:  2225  MD aware. New Orders received.

## 2015-12-30 NOTE — Anesthesia Post-op Follow-up Note (Addendum)
  Anesthesia Pain Follow-up Note  Patient: BEOWULF REPETTI  Day #: 1  Date of Follow-up: 12/30/2015 Time: 10:20 AM  Last Vitals:  Vitals:   12/30/15 0915 12/30/15 0930  BP: 108/73 104/70  Pulse:  (!) 125  Resp: (!) 22 (!) 25  Temp:      Level of Consciousness: alert  Pain: mild   Side Effects:None  Catheter Site Exam:clean, dry  Epidural / Intrathecal    Start     Dose/Rate Route Frequency Ordered Stop   12/29/15 1100  ropivacaine (PF) 2 mg/ml (0.2%) (NAROPIN) epidural     10 mL/hr 10 mL/hr  Epidural Continuous 12/29/15 1030         Plan: Continue current therapy  - Ok to receive anticoagulants per guidelines if needed - will continue to follow.   Effie Berkshire

## 2015-12-30 NOTE — Procedures (Signed)
Chest Tube Insertion Procedure Note  Indications:  Clinically significant Pneumothorax left  Pre-operative Diagnosis: Pneumothorax left   Post-operative Diagnosis: Pneumothorax left   Procedure Details  Informed consent was obtained for the procedure, including sedation.  Risks of lung perforation, hemorrhage, arrhythmia, and adverse drug reaction were discussed.   After sterile skin prep, using standard technique, a 20 French tube was placed in the left anterior 5 th rib space.  Findings: Small rush of air 20 cc blood  Estimated Blood Loss:  less than 50 mL         Specimens:  None              Complications:  None; patient tolerated the procedure well.         Disposition: ICU - intubated and critically ill.         Condition: stable  Attending Attestation: I performed the procedure.

## 2015-12-30 NOTE — Progress Notes (Signed)
Subjective: Pt feels well minimal pain  Fair cough   Objective: Vital signs in last 24 hours: Temp:  [96 F (35.6 C)-98.9 F (37.2 C)] 98.9 F (37.2 C) (08/12 0400) Pulse Rate:  [58-151] 59 (08/12 0715) Resp:  [13-35] 19 (08/12 0715) BP: (69-140)/(32-83) 102/70 (08/12 0715) SpO2:  [80 %-100 %] 100 % (08/12 0700) FiO2 (%):  [30 %-100 %] 30 % (08/11 1727)    Intake/Output from previous day: 08/11 0701 - 08/12 0700 In: 3864.5 [P.O.:1000; I.V.:2754.5] Out: 1915 [id number] Intake/Output this shift: No intake/output data recorded.  Resp: rhonchi bilaterally   good air movement  cough is adequate    CV: RRR  Abdomen  Soft  ND NT    Neuro :  GCS 15    He has some   Numbness of right foot      Lab Results:   Recent Labs  12/28/15 2334 12/28/15 2350 12/30/15 0430  WBC 16.7*  --  13.4*  HGB 15.3 15.6 12.7*  HCT 43.8 46.0 37.2*  PLT 202  --  230   BMET  Recent Labs  12/28/15 2334 12/28/15 2350  NA 127* 128*  K 4.2 4.4  CL 91* 92*  CO2 22  --   GLUCOSE 116* 110*  BUN 11 10  CREATININE 0.90 0.80  CALCIUM 8.7*  --    PT/INR No results for input(s): LABPROT, INR in the last 72 hours. ABG No results for input(s): PHART, HCO3 in the last 72 hours.  Invalid input(s): PCO2, PO2  Studies/Results: Ct Head Wo Contrast  Result Date: 12/29/2015 CLINICAL DATA:  Acute onset of left rib and back pain, status post fall down steps. Concern for head or cervical spine injury. Initial encounter. EXAM: CT HEAD WITHOUT CONTRAST CT CERVICAL SPINE WITHOUT CONTRAST TECHNIQUE: Multidetector CT imaging of the head and cervical spine was performed following the standard protocol without intravenous contrast. Multiplanar CT image reconstructions of the cervical spine were also generated. COMPARISON:  MRI of the brain performed 03/20/2005 FINDINGS: CT HEAD FINDINGS There is no evidence of acute infarction, mass lesion, or intra- or extra-axial hemorrhage on CT. Bilateral subdural  hygromas are again noted. There is underlying mild cortical volume loss. Mild cerebellar atrophy is noted. Scattered periventricular and subcortical white matter change likely reflects small vessel ischemic microangiopathy. The brainstem and fourth ventricle are within normal limits. The basal ganglia are unremarkable in appearance. The cerebral hemispheres demonstrate grossly normal gray-white differentiation. No mass effect or midline shift is seen. There is no evidence of fracture; visualized osseous structures are unremarkable in appearance. The orbits are within normal limits. The paranasal sinuses and mastoid air cells are well-aerated. No significant soft tissue abnormalities are seen. CT CERVICAL SPINE FINDINGS There is no evidence of fracture or subluxation. Vertebral bodies demonstrate normal height and alignment. Mild multilevel disc space narrowing is noted along the lower cervical spine, with scattered anterior and posterior disc osteophyte complexes, and underlying facet disease along the cervical spine. Prevertebral soft tissues are within normal limits. The thyroid gland is unremarkable in appearance. The visualized lung apices are clear. Scattered calcification is noted at the carotid bifurcations bilaterally, with likely moderate right-sided luminal narrowing. IMPRESSION: 1. No evidence of traumatic intracranial injury or fracture. 2. No evidence of fracture or subluxation along the cervical spine. 3. Mild cortical volume loss and scattered small vessel ischemic microangiopathy. 4. Bilateral subdural hygromas again noted. 5. Mild degenerative change along the cervical spine. 6. Scattered calcification at the carotid bifurcations bilaterally, with  likely moderate right-sided luminal narrowing. Carotid ultrasound would be helpful for further evaluation, when and as deemed clinically appropriate. Electronically Signed   By: Garald Balding M.D.   On: 12/29/2015 01:07   Ct Chest W Contrast  Result  Date: 12/29/2015 CLINICAL DATA:  80 year old male with acute left chest pain, abdominal and pelvic pain following fall. EXAM: CT CHEST, ABDOMEN, AND PELVIS WITH CONTRAST TECHNIQUE: Multidetector CT imaging of the chest, abdomen and pelvis was performed following the standard protocol during bolus administration of intravenous contrast. CONTRAST:  13mL ISOVUE-300 IOPAMIDOL (ISOVUE-300) INJECTION 61% COMPARISON:  Prior radiographs FINDINGS: CT CHEST FINDINGS Cardiovascular: Cardiomegaly and coronary artery calcifications noted. There is no evidence of thoracic aortic aneurysm. Mediastinum/Nodes: No mediastinal mass, enlarged lymph nodes or pericardial effusion. Lungs/Pleura: A small anterior inferior left pneumothorax identified. Mild dependent and basilar atelectasis noted. No airspace disease, suspicious nodule, mass or consolidation identified. Musculoskeletal: There are acute fractures of the left third through twelfth ribs. Fractures of the lateral fourth through seventh ribs are displaced. Left subcutaneous emphysema is identified. CT ABDOMEN PELVIS FINDINGS Hepatobiliary: Hepatic steatosis identified. The gallbladder is unremarkable. There is no evidence of biliary dilatation. Pancreas: Unremarkable Spleen: Unremarkable Adrenals/Urinary Tract: The kidneys, adrenal glands and bladder are unremarkable. Stomach/Bowel: No bowel obstruction or definite bowel wall thickening. The appendix is normal. Vascular/Lymphatic: Heavy abdominal aortic atherosclerotic calcifications noted without aneurysm. No enlarged lymph nodes identified. Reproductive: Unremarkable Other: No free fluid or pneumoperitoneum. A small to moderate right inguinal hernia containing small bowel noted without bowel obstruction. A small left inguinal hernia containing fat is present. Musculoskeletal: A nondisplaced fracture of the anterior left acetabulum noted. Nondisplaced fractures of the right L3 and L4 transverse process is noted. IMPRESSION:  Acute fractures of the left third through twelfth ribs with small left pneumothorax and left subcutaneous emphysema. Acute nondisplaced fractures of the anterior left acetabulum and right L3 and L4 transverse processes. No evidence of solid or hollow visceral injury. Cardiomegaly and coronary artery disease. Hepatic steatosis. Abdominal aortic atherosclerosis. Critical Value/emergent results were called by telephone at the time of interpretation on 12/29/2015 at 1:08 am to Dr. Delora Fuel , who verbally acknowledged these results. Electronically Signed   By: Margarette Canada M.D.   On: 12/29/2015 01:08   Ct Cervical Spine Wo Contrast  Result Date: 12/29/2015 CLINICAL DATA:  Acute onset of left rib and back pain, status post fall down steps. Concern for head or cervical spine injury. Initial encounter. EXAM: CT HEAD WITHOUT CONTRAST CT CERVICAL SPINE WITHOUT CONTRAST TECHNIQUE: Multidetector CT imaging of the head and cervical spine was performed following the standard protocol without intravenous contrast. Multiplanar CT image reconstructions of the cervical spine were also generated. COMPARISON:  MRI of the brain performed 03/20/2005 FINDINGS: CT HEAD FINDINGS There is no evidence of acute infarction, mass lesion, or intra- or extra-axial hemorrhage on CT. Bilateral subdural hygromas are again noted. There is underlying mild cortical volume loss. Mild cerebellar atrophy is noted. Scattered periventricular and subcortical white matter change likely reflects small vessel ischemic microangiopathy. The brainstem and fourth ventricle are within normal limits. The basal ganglia are unremarkable in appearance. The cerebral hemispheres demonstrate grossly normal gray-white differentiation. No mass effect or midline shift is seen. There is no evidence of fracture; visualized osseous structures are unremarkable in appearance. The orbits are within normal limits. The paranasal sinuses and mastoid air cells are well-aerated. No  significant soft tissue abnormalities are seen. CT CERVICAL SPINE FINDINGS There is no evidence of fracture  or subluxation. Vertebral bodies demonstrate normal height and alignment. Mild multilevel disc space narrowing is noted along the lower cervical spine, with scattered anterior and posterior disc osteophyte complexes, and underlying facet disease along the cervical spine. Prevertebral soft tissues are within normal limits. The thyroid gland is unremarkable in appearance. The visualized lung apices are clear. Scattered calcification is noted at the carotid bifurcations bilaterally, with likely moderate right-sided luminal narrowing. IMPRESSION: 1. No evidence of traumatic intracranial injury or fracture. 2. No evidence of fracture or subluxation along the cervical spine. 3. Mild cortical volume loss and scattered small vessel ischemic microangiopathy. 4. Bilateral subdural hygromas again noted. 5. Mild degenerative change along the cervical spine. 6. Scattered calcification at the carotid bifurcations bilaterally, with likely moderate right-sided luminal narrowing. Carotid ultrasound would be helpful for further evaluation, when and as deemed clinically appropriate. Electronically Signed   By: Garald Balding M.D.   On: 12/29/2015 01:07   Ct Abdomen Pelvis W Contrast  Result Date: 12/29/2015 CLINICAL DATA:  80 year old male with acute left chest pain, abdominal and pelvic pain following fall. EXAM: CT CHEST, ABDOMEN, AND PELVIS WITH CONTRAST TECHNIQUE: Multidetector CT imaging of the chest, abdomen and pelvis was performed following the standard protocol during bolus administration of intravenous contrast. CONTRAST:  155mL ISOVUE-300 IOPAMIDOL (ISOVUE-300) INJECTION 61% COMPARISON:  Prior radiographs FINDINGS: CT CHEST FINDINGS Cardiovascular: Cardiomegaly and coronary artery calcifications noted. There is no evidence of thoracic aortic aneurysm. Mediastinum/Nodes: No mediastinal mass, enlarged lymph nodes or  pericardial effusion. Lungs/Pleura: A small anterior inferior left pneumothorax identified. Mild dependent and basilar atelectasis noted. No airspace disease, suspicious nodule, mass or consolidation identified. Musculoskeletal: There are acute fractures of the left third through twelfth ribs. Fractures of the lateral fourth through seventh ribs are displaced. Left subcutaneous emphysema is identified. CT ABDOMEN PELVIS FINDINGS Hepatobiliary: Hepatic steatosis identified. The gallbladder is unremarkable. There is no evidence of biliary dilatation. Pancreas: Unremarkable Spleen: Unremarkable Adrenals/Urinary Tract: The kidneys, adrenal glands and bladder are unremarkable. Stomach/Bowel: No bowel obstruction or definite bowel wall thickening. The appendix is normal. Vascular/Lymphatic: Heavy abdominal aortic atherosclerotic calcifications noted without aneurysm. No enlarged lymph nodes identified. Reproductive: Unremarkable Other: No free fluid or pneumoperitoneum. A small to moderate right inguinal hernia containing small bowel noted without bowel obstruction. A small left inguinal hernia containing fat is present. Musculoskeletal: A nondisplaced fracture of the anterior left acetabulum noted. Nondisplaced fractures of the right L3 and L4 transverse process is noted. IMPRESSION: Acute fractures of the left third through twelfth ribs with small left pneumothorax and left subcutaneous emphysema. Acute nondisplaced fractures of the anterior left acetabulum and right L3 and L4 transverse processes. No evidence of solid or hollow visceral injury. Cardiomegaly and coronary artery disease. Hepatic steatosis. Abdominal aortic atherosclerosis. Critical Value/emergent results were called by telephone at the time of interpretation on 12/29/2015 at 1:08 am to Dr. Delora Fuel , who verbally acknowledged these results. Electronically Signed   By: Margarette Canada M.D.   On: 12/29/2015 01:08   Dg Chest Port 1 View  Result Date:  12/30/2015 CLINICAL DATA:  Patient with history of multiple rib fractures on the left. EXAM: PORTABLE CHEST 1 VIEW COMPARISON:  Chest radiograph 12/29/2015. FINDINGS: Monitoring leads overlie the patient. Stable enlarged cardiac and mediastinal contours. Slight interval increase in mid and lower lung heterogeneous pulmonary opacities. Calcified granuloma right mid lung. Interval increase in subcutaneous emphysema overlying the left chest wall. Multiple displaced left lateral rib fractures are demonstrated. Findings suggestive of small left  apical pneumothorax. IMPRESSION: Small left apical pneumothorax. Increased basilar heterogeneous opacities which may represent atelectasis. Multiple displaced left-sided rib fractures. Increased left chest wall subcutaneous emphysema. Critical Value/emergent results were called by telephone at the time of interpretation on 12/30/2015 at 7:13 am to Nurse Weston Brass, who verbally acknowledged these results. Electronically Signed   By: Lovey Newcomer M.D.   On: 12/30/2015 07:15   Dg Chest Port 1 View  Result Date: 12/29/2015 CLINICAL DATA:  Acute onset of worsening shortness of breath and left-sided chest pain, with decreased O2 saturation. Status post fall down steps. Initial encounter. EXAM: PORTABLE CHEST 1 VIEW COMPARISON:  Chest radiograph and CT of the chest performed 12/28/2015 FINDINGS: The lungs are well-aerated. Mild left-sided atelectasis is again noted. Vascular congestion is seen. There is no evidence of pleural effusion or pneumothorax. The cardiomediastinal silhouette is mildly enlarged. [name] known numerous left-sided rib fractures are only partially characterized, with scattered soft tissue air along the left chest wall. IMPRESSION: 1. Mild left-sided atelectasis again noted, with underlying numerous left-sided rib fractures and scattered soft tissue air along the left chest wall. 2.  Vascular congestion and mild cardiomegaly. Electronically Signed   By:  Garald Balding M.D.   On: 12/29/2015 04:38   Dg Chest Port 1 View  Result Date: 12/29/2015 CLINICAL DATA:  Acute onset of left rib pain and back pain, status post fall down steps. Initial encounter. EXAM: PORTABLE CHEST 1 VIEW COMPARISON:  Chest radiograph performed 09/05/2011 FINDINGS: The lungs are well-aerated. Mild vascular congestion is noted. Mild left basilar atelectasis is seen. There is no evidence of pleural effusion or pneumothorax. The cardiomediastinal silhouette is enlarged. There is a displaced fracture of the left lateral seventh rib, with a small amount of associated soft tissue air along the chest wall. IMPRESSION: 1. Displaced fracture of the left lateral seventh rib, with small amount of associated soft tissue air along the left chest wall. Mild left basilar atelectasis noted. 2. Mild vascular congestion and cardiomegaly noted. Electronically Signed   By: Garald Balding M.D.   On: 12/29/2015 00:10    Anti-infectives: Anti-infectives    None      Assessment/Plan: Patient Active Problem List   Diagnosis Date Noted  . Traumatic fracture of ribs with pneumothorax 12/29/2015  . Multiple rib fractures 12/29/2015  . Cardiomyopathy ([place])   . Hypertension   . Pulmonary fibrosis ([place])   . Osteoarthritis   . Tobacco abuse     Epidural catheter with good pain control  Add PICC for vasoactive medications Pulmonary toilet Very small left PTX /subcutaneous air  - follow for now does not need CT at this point unless it gets larger or patients condition changes    LOS: 1 day    Jurnie Garritano A. 12/30/2015

## 2015-12-30 NOTE — Progress Notes (Signed)
Pt was intubated per CRNA. Pt was preoxygenated with 100% oxygen with bag mask manual ventilation. Pt was intubated with a 8.0 ETT secured at 23lip. With commercial tube hollister.  No distress or complications noted. MD at bedside. Patient placed on mechanical ventilation on 8cc/kg. RT will continue to monitor patient status

## 2015-12-30 NOTE — Progress Notes (Signed)
Care discussed with the family Voiced understanding Continue supportive care CT placed for left PTX  Family aware and questions answered.

## 2015-12-30 NOTE — Progress Notes (Signed)
Hyponatremia noted  Fluid restriction On lasix Hx of cardiomyopathy and severe COPD Will ask CCM to help manage his conditions

## 2015-12-31 ENCOUNTER — Inpatient Hospital Stay (HOSPITAL_COMMUNITY): Payer: Medicare Other

## 2015-12-31 DIAGNOSIS — J9601 Acute respiratory failure with hypoxia: Secondary | ICD-10-CM

## 2015-12-31 LAB — OSMOLALITY, URINE: Osmolality, Ur: 480 mOsm/kg (ref 300–900)

## 2015-12-31 LAB — CBC
HCT: 42.9 % (ref 39.0–52.0)
Hemoglobin: 14.7 g/dL (ref 13.0–17.0)
MCH: 34.3 pg — AB (ref 26.0–34.0)
MCHC: 34.3 g/dL (ref 30.0–36.0)
MCV: 100 fL (ref 78.0–100.0)
PLATELETS: 186 10*3/uL (ref 150–400)
RBC: 4.29 MIL/uL (ref 4.22–5.81)
RDW: 13.8 % (ref 11.5–15.5)
WBC: 16.2 10*3/uL — ABNORMAL HIGH (ref 4.0–10.5)

## 2015-12-31 LAB — NA AND K (SODIUM & POTASSIUM), RAND UR: Potassium Urine: 36 mmol/L

## 2015-12-31 LAB — BLOOD GAS, ARTERIAL
Acid-base deficit: 1.9 mmol/L (ref 0.0–2.0)
BICARBONATE: 22.3 meq/L (ref 20.0–24.0)
Drawn by: 235881
FIO2: 90
MECHVT: 540 mL
O2 SAT: 95.4 %
PATIENT TEMPERATURE: 98.6
PCO2 ART: 37.4 mmHg (ref 35.0–45.0)
PEEP/CPAP: 8 cmH2O
PH ART: 7.392 (ref 7.350–7.450)
PO2 ART: 82.9 mmHg (ref 80.0–100.0)
RATE: 24 resp/min
TCO2: 23.4 mmol/L (ref 0–100)

## 2015-12-31 LAB — BASIC METABOLIC PANEL
ANION GAP: 5 (ref 5–15)
ANION GAP: 7 (ref 5–15)
Anion gap: 12 (ref 5–15)
Anion gap: 7 (ref 5–15)
Anion gap: 4 — ABNORMAL LOW (ref 5–15)
BUN: 17 mg/dL (ref 6–20)
BUN: 18 mg/dL (ref 6–20)
BUN: 17 mg/dL (ref 6–20)
BUN: 17 mg/dL (ref 6–20)
BUN: 17 mg/dL (ref 6–20)
CALCIUM: 8.4 mg/dL — AB (ref 8.9–10.3)
CALCIUM: 7.9 mg/dL — AB (ref 8.9–10.3)
CALCIUM: 8.1 mg/dL — AB (ref 8.9–10.3)
CHLORIDE: 86 mmol/L — AB (ref 101–111)
CHLORIDE: 88 mmol/L — AB (ref 101–111)
CO2: 21 mmol/L — ABNORMAL LOW (ref 22–32)
CO2: 23 mmol/L (ref 22–32)
CO2: 21 mmol/L — AB (ref 22–32)
CO2: 24 mmol/L (ref 22–32)
CO2: 21 mmol/L — AB (ref 22–32)
CREATININE: 1.41 mg/dL — AB (ref 0.61–1.24)
CREATININE: 1.22 mg/dL (ref 0.61–1.24)
Calcium: 7.9 mg/dL — ABNORMAL LOW (ref 8.9–10.3)
Calcium: 7.8 mg/dL — ABNORMAL LOW (ref 8.9–10.3)
Chloride: 82 mmol/L — ABNORMAL LOW (ref 101–111)
Chloride: 88 mmol/L — ABNORMAL LOW (ref 101–111)
Chloride: 88 mmol/L — ABNORMAL LOW (ref 101–111)
Creatinine, Ser: 1.1 mg/dL (ref 0.61–1.24)
Creatinine, Ser: 1.11 mg/dL (ref 0.61–1.24)
Creatinine, Ser: 0.95 mg/dL (ref 0.61–1.24)
GFR calc Af Amer: 54 mL/min — ABNORMAL LOW (ref >60)
GFR calc Af Amer: >60 mL/min (ref >60)
GFR calc Af Amer: >60 mL/min (ref >60)
GFR calc non Af Amer: 55 mL/min — ABNORMAL LOW (ref >60)
GFR, EST NON AFRICAN AMERICAN: 47 mL/min — AB (ref >60)
GLUCOSE: 103 mg/dL — AB (ref 65–99)
GLUCOSE: 105 mg/dL — AB (ref 65–99)
Glucose, Bld: 95 mg/dL (ref 65–99)
Glucose, Bld: 104 mg/dL — ABNORMAL HIGH (ref 65–99)
Glucose, Bld: 109 mg/dL — ABNORMAL HIGH (ref 65–99)
POTASSIUM: 5.1 mmol/L (ref 3.5–5.1)
POTASSIUM: 4.5 mmol/L (ref 3.5–5.1)
POTASSIUM: 4.6 mmol/L (ref 3.5–5.1)
Potassium: 4.6 mmol/L (ref 3.5–5.1)
Potassium: 4.7 mmol/L (ref 3.5–5.1)
SODIUM: 115 mmol/L — AB (ref 135–145)
SODIUM: 114 mmol/L — AB (ref 135–145)
SODIUM: 116 mmol/L — AB (ref 135–145)
SODIUM: 116 mmol/L — AB (ref 135–145)
Sodium: 116 mmol/L — CL (ref 135–145)

## 2015-12-31 LAB — BLOOD GAS, VENOUS
ACID-BASE DEFICIT: 6.1 mmol/L — AB (ref 0.0–2.0)
Bicarbonate: 20.7 mEq/L (ref 20.0–24.0)
O2 Saturation: 52.9 %
PCO2 VEN: 54.5 mmHg — AB (ref 45.0–50.0)
PH VEN: 7.203 — AB (ref 7.250–7.300)
PO2 VEN: 35.1 mmHg (ref 31.0–45.0)
Patient temperature: 98.6
TCO2: 22.3 mmol/L (ref 0–100)

## 2015-12-31 LAB — CARBOXYHEMOGLOBIN
Carboxyhemoglobin: 1.2 % (ref 0.5–1.5)
Methemoglobin: 0.9 % (ref 0.0–1.5)
O2 Saturation: 74 %
TOTAL HEMOGLOBIN: 15.2 g/dL (ref 13.5–18.0)

## 2015-12-31 LAB — PHOSPHORUS
Phosphorus: 2.1 mg/dL — ABNORMAL LOW (ref 2.5–4.6)
Phosphorus: 2.4 mg/dL — ABNORMAL LOW (ref 2.5–4.6)

## 2015-12-31 LAB — BRAIN NATRIURETIC PEPTIDE: B Natriuretic Peptide: 1963.7 pg/mL — ABNORMAL HIGH (ref 0.0–100.0)

## 2015-12-31 LAB — LACTIC ACID, PLASMA
LACTIC ACID, VENOUS: 4 mmol/L — AB (ref 0.5–1.9)
Lactic Acid, Venous: 1.5 mmol/L (ref 0.5–1.9)

## 2015-12-31 LAB — GLUCOSE, CAPILLARY: GLUCOSE-CAPILLARY: 107 mg/dL — AB (ref 65–99)

## 2015-12-31 LAB — MAGNESIUM
MAGNESIUM: 1.6 mg/dL — AB (ref 1.7–2.4)
Magnesium: 1.5 mg/dL — ABNORMAL LOW (ref 1.7–2.4)

## 2015-12-31 MED ORDER — PRO-STAT SUGAR FREE PO LIQD
30.0000 mL | Freq: Two times a day (BID) | ORAL | Status: DC
  Administered 2015-12-31 – 2016-01-01 (×3): 30 mL
  Filled 2015-12-31 (×2): qty 30

## 2015-12-31 MED ORDER — SODIUM CHLORIDE 0.9 % IV SOLN
50.0000 ug/h | INTRAVENOUS | Status: DC
  Administered 2015-12-31: 25 ug/h via INTRAVENOUS
  Administered 2016-01-01: 135 ug/h via INTRAVENOUS
  Administered 2016-01-01: 150 ug/h via INTRAVENOUS
  Administered 2016-01-02: 125 ug/h via INTRAVENOUS
  Administered 2016-01-03: 80 ug/h via INTRAVENOUS
  Administered 2016-01-04: 75 ug/h via INTRAVENOUS
  Administered 2016-01-06 (×2): 100 ug/h via INTRAVENOUS
  Administered 2016-01-07: 75 ug/h via INTRAVENOUS
  Administered 2016-01-09: 50 ug/h via INTRAVENOUS
  Administered 2016-01-10 – 2016-01-12 (×3): 75 ug/h via INTRAVENOUS
  Filled 2015-12-31 (×13): qty 50

## 2015-12-31 MED ORDER — SODIUM CHLORIDE 0.9 % IV SOLN: INTRAVENOUS | Status: DC | PRN

## 2015-12-31 MED ORDER — FUROSEMIDE 10 MG/ML IJ SOLN
80.0000 mg | Freq: Once | INTRAMUSCULAR | Status: AC
Start: 2015-12-31 — End: 2015-12-31
  Administered 2015-12-31: 80 mg via INTRAVENOUS
  Filled 2015-12-31: qty 8

## 2015-12-31 MED ORDER — ANTISEPTIC ORAL RINSE SOLUTION (CORINZ)
7.0000 mL | Freq: Four times a day (QID) | OROMUCOSAL | Status: DC
  Administered 2015-12-31 – 2016-01-01 (×5): 7 mL via OROMUCOSAL

## 2015-12-31 MED ORDER — CHLORHEXIDINE GLUCONATE 0.12% ORAL RINSE (MEDLINE KIT)
15.0000 mL | Freq: Two times a day (BID) | OROMUCOSAL | Status: DC
  Administered 2015-12-31 – 2016-01-01 (×3): 15 mL via OROMUCOSAL

## 2015-12-31 MED ORDER — VITAL HIGH PROTEIN PO LIQD
1000.0000 mL | ORAL | Status: DC
  Administered 2015-12-31: 1000 mL

## 2015-12-31 MED ORDER — DOPAMINE-DEXTROSE 3.2-5 MG/ML-% IV SOLN
0.0000 ug/kg/min | INTRAVENOUS | Status: DC
  Administered 2015-12-31: 5 ug/kg/min via INTRAVENOUS
  Administered 2015-12-31: 9 ug/kg/min via INTRAVENOUS
  Filled 2015-12-31 (×2): qty 250

## 2015-12-31 MED ORDER — FAMOTIDINE IN NACL 20-0.9 MG/50ML-% IV SOLN
20.0000 mg | INTRAVENOUS | Status: DC
  Administered 2015-12-31 – 2016-01-16 (×17): 20 mg via INTRAVENOUS
  Filled 2015-12-31 (×17): qty 50

## 2015-12-31 NOTE — Progress Notes (Signed)
[facility] Progress Note Patient Name: Chad Rogers DOB: 05-Sep-1935 MRN: [id number]   Date of Service  12/31/2015  HPI/Events of Note  Called for hypoxemia.  Patient responded to lavage and suctioning by RT.  eICU Interventions  cxr ordered to evaluate.  Patient has chest tube in place for ptx.     Intervention Category Major Interventions: Hypoxemia - evaluation and management  [place], HEXT, P 12/31/2015, 3:43 AM

## 2015-12-31 NOTE — Progress Notes (Signed)
Subjective: Intubated sedated  Objective: Vital signs in last 24 hours: Temp:  [97.5 F (36.4 C)-98.5 F (36.9 C)] 98.4 F (36.9 C) (08/13 0800) Pulse Rate:  [25-133] 90 (08/13 0754) Resp:  [16-31] 24 (08/13 0754) BP: (68-147)/(22-106) 103/69 (08/13 0754) SpO2:  [41 %-100 %] 100 % (08/13 0754) FiO2 (%):  [50 %-100 %] 90 % (08/13 0754)    Intake/Output from previous day: 08/12 0701 - 08/13 0700 In: 5423.1 [P.O.:480; I.V.:4813.1] Out: 920 [Urine:920] Intake/Output this shift: Total I/O In: 315.1 [I.V.:195.1; Other:120] Out: 360 [Urine:360]  General appearance: intubated sedated Resp: coarse bilateral left ct without air leak Cardio: regular rate and rhythm GI: soft nt  Lab Results:   Recent Labs  12/30/15 0430 12/31/15 0330  WBC 13.4* 16.2*  HGB 12.7* 14.7  HCT 37.2* 42.9  PLT 230 186   BMET  Recent Labs  12/30/15 2150 12/31/15 0330  NA 114* 115*  K 5.4* 5.1  CL 84* 82*  CO2 23 21*  GLUCOSE 231* 95  BUN 16 17  CREATININE 1.10 1.41*  CALCIUM 8.0* 8.4*   PT/INR No results for input(s): LABPROT, INR in the last 72 hours. ABG  Recent Labs  12/30/15 2107 12/31/15 0200  PHART 7.323*  --   HCO3 19.6* 20.7    Studies/Results: Dg Chest Port 1 View  Result Date: 12/31/2015 CLINICAL DATA:  Acute onset of shortness of breath. Desaturation. Initial encounter. EXAM: PORTABLE CHEST 1 VIEW COMPARISON:  Chest radiograph performed 12/30/2015 FINDINGS: [name] endotracheal tube is seen ending 2 cm above the carina. An enteric tube is noted extending below the diaphragm. Vascular congestion is noted. Bibasilar airspace opacities may reflect pulmonary edema or pneumonia, worsened from the prior study. No definite pneumothorax is seen. The cardiomediastinal silhouette is mildly enlarged. Left-sided rib fractures are again noted, with overlying soft tissue air. The left-sided chest tube is unchanged in appearance. IMPRESSION: 1. Vascular congestion and mild  cardiomegaly. Bibasilar airspace opacities may reflect pulmonary edema or pneumonia, worsened from the prior study. 2. Endotracheal tube seen ending 2 cm above the carina. 3. Left-sided chest tube is unchanged in appearance. 4. Left-sided rib fractures again noted, with overlying soft tissue air. Electronically Signed   By: Garald Balding M.D.   On: 12/31/2015 04:09   Dg Chest Port 1 View  Result Date: 12/30/2015 CLINICAL DATA:  Chest tube placement.  Initial encounter. EXAM: PORTABLE CHEST 1 VIEW COMPARISON:  Chest radiograph performed earlier today at 8:57 p.m. FINDINGS: The previously noted small left pneumothorax is no longer definitely seen. The left-sided chest tube is seen ending overlying the left hilum. Overlying soft tissue air is again noted. Underlying left-sided rib fractures are better characterized on prior CT. A right PICC is noted ending about the distal SVC. [name] endotracheal tube is seen ending 4 cm above the carina. An enteric tube is noted extending below the diaphragm. Mild right basilar opacity may reflect atelectasis, as it is new from the recent prior study. No pneumothorax is seen. The cardiomediastinal silhouette is mildly enlarged. No acute osseous abnormalities identified. IMPRESSION: 1. Previously noted small left pneumothorax is no longer definitely seen. Left-sided chest tube noted overlying the left hilum. Associated left-sided rib fractures and overlying soft tissue air again noted. 2. Endotracheal tube seen ending 4 cm above the carina. 3. Mild right basilar airspace opacity is new from the recent prior study and may reflect atelectasis. 4. Mild cardiomegaly. Electronically Signed   By: Garald Balding M.D.   On:  12/30/2015 22:49   Dg Chest Port 1 View  Result Date: 12/30/2015 CLINICAL DATA:  Endotracheal tube placement. Orogastric tube placement. Initial encounter. EXAM: PORTABLE CHEST 1 VIEW COMPARISON:  Chest radiograph performed earlier today at 3:31 p.m.  FINDINGS: [name] endotracheal tube is seen ending 5 cm above the carina. An enteric tube is noted extending below the diaphragm. Mild vascular congestion is noted. Mild bibasilar opacities may reflect mild interstitial edema. A small left-sided pneumothorax is noted, slightly more apparent than on the prior study. No significant pleural effusion is seen. The cardiomediastinal silhouette is mildly enlarged. Left-sided rib fractures are again seen, with overlying soft tissue air. IMPRESSION: 1. Endotracheal tube seen ending 5 cm above the carina. 2. Enteric tube seen extending below the diaphragm. 3. Mild vascular congestion and cardiomegaly. Mild bibasilar opacities may reflect mild interstitial edema. 4. Small left-sided pneumothorax is slightly more apparent than on the prior study. 5. Left-sided rib fractures again noted, with overlying soft tissue air. Electronically Signed   By: Garald Balding M.D.   On: 12/30/2015 21:17   Dg Chest Port 1 View  Result Date: 12/30/2015 CLINICAL DATA:  Line placement EXAM: PORTABLE CHEST 1 VIEW COMPARISON:  Portable exam 1531 hours compared to 0432 hours FINDINGS: RIGHT arm PICC line tip projects over SVC. Enlargement of cardiac silhouette with pulmonary vascular congestion. Atherosclerotic calcification aorta. Bibasilar atelectasis. Small LEFT apical pneumothorax seen on previous study less well visualized. No definite infiltrate or pleural effusion. Osseous demineralization with LEFT rib fractures again noted. IMPRESSION: Tip of RIGHT arm PICC line projects over SVC. Aortic atherosclerosis. Bibasilar atelectasis. Electronically Signed   By: Lavonia Dana M.D.   On: 12/30/2015 15:40   Dg Chest Port 1 View  Result Date: 12/30/2015 CLINICAL DATA:  Patient with history of multiple rib fractures on the left. EXAM: PORTABLE CHEST 1 VIEW COMPARISON:  Chest radiograph 12/29/2015. FINDINGS: Monitoring leads overlie the patient. Stable enlarged cardiac and mediastinal  contours. Slight interval increase in mid and lower lung heterogeneous pulmonary opacities. Calcified granuloma right mid lung. Interval increase in subcutaneous emphysema overlying the left chest wall. Multiple displaced left lateral rib fractures are demonstrated. Findings suggestive of small left apical pneumothorax. IMPRESSION: Small left apical pneumothorax. Increased basilar heterogeneous opacities which may represent atelectasis. Multiple displaced left-sided rib fractures. Increased left chest wall subcutaneous emphysema. Critical Value/emergent results were called by telephone at the time of interpretation on 12/30/2015 at 7:13 am to Nurse Weston Brass, who verbally acknowledged these results. Electronically Signed   By: Lovey Newcomer M.D.   On: 12/30/2015 07:15   Dg Abd Portable 1v  Result Date: 12/30/2015 CLINICAL DATA:  Enteric tube placement.  Initial encounter. EXAM: PORTABLE ABDOMEN - 1 VIEW COMPARISON:  CT of the abdomen and pelvis from 12/28/2015 FINDINGS: [name] enteric tube is seen coiling within the stomach, ending overlying the fundus of the stomach. The visualized bowel gas pattern is grossly unremarkable. No free intra-abdominal air is seen, though evaluation for free air is limited on a single supine view. No acute osseous abnormalities are seen. Mild degenerative change is noted along the lower lumbar spine. IMPRESSION: Enteric tube noted coiling within the stomach, ending overlying the fundus of the stomach. Electronically Signed   By: Garald Balding M.D.   On: 12/30/2015 21:22    Anti-infectives: Anti-infectives    None      Assessment/Plan:  Fall Left sided rib fractures 3 through 12 with small pneumothorax - intubated yesterday with left chest tube now,  doing fair this am, ccm consulted and appreciate their assistance L3 and L4 transverse process fracture- pain control Left acetabular fracture - plan was to consult ortho, I dont see where this was done, will  discuss with trauma team in am, no role for anything at this time due to medical condition though COPD CHF- appreciate ccm assistance with multiple comorbidities GI- start tube feeds ETOH abuse - CIWA [facility] 12/31/2015

## 2015-12-31 NOTE — Consult Note (Addendum)
PULMONARY / CRITICAL CARE MEDICINE   Name: FINNIGAN ORBIN MRN: [id number] DOB: Jan 20, 1936    ADMISSION DATE:  12/28/2015 CONSULTATION DATE:  December 31, 2015  REFERRING MD:  Trauma Md, MD  CHIEF COMPLAINT:  Fall  HISTORY OF PRESENT ILLNESS:   Mr. Rohlik is a 80 y/o M w/ MMP, most notable for COPD, ischemic cardiomyopathy (EF ~25%), and EtOH abuse who presented as a trauma and was transferred to [facility] with rib fx, PTX, and hip fx. He was placed on CIWA given his significant EtOH hx, and was treated with ativan per protocol. He became more somnolent, and was intubated. PCCM was consulted for decompensation of his MMP.  PAST MEDICAL HISTORY :  He  has a past medical history of Alcohol abuse; Allergic rhinitis; Cardiomyopathy (1.13.2011); CHF (congestive heart failure) ([place]); COPD (chronic obstructive pulmonary disease) ([place]); Coronary artery disease; Hypertension; Osteoarthritis; Psoriasis; Pulmonary fibrosis ([place]); Skin cancer; and Tobacco abuse.  PAST SURGICAL HISTORY: He  has a past surgical history that includes Inguinal hernia repair; Rotator cuff repair; and Hand surgery.  No Known Allergies  No current facility-administered medications on file prior to encounter.    Current Outpatient Prescriptions on File Prior to Encounter  Medication Sig  . albuterol (PROVENTIL HFA) 108 (90 BASE) MCG/ACT inhaler Inhale 2 puffs into the lungs every 6 (six) hours as needed.    Marland Kitchen aspirin 81 MG tablet Take 81 mg by mouth daily.      FAMILY HISTORY:  His indicated that his mother is deceased. He indicated that his father is deceased. He indicated that only one of his two brothers is alive. He indicated that the status of his maternal grandmother is unknown.    SOCIAL HISTORY: He  reports that he has been smoking Cigarettes.  He has a 100.00 pack-year smoking history. He has quit using smokeless tobacco. He reports that he drinks about 17.5 oz of alcohol per week .  REVIEW OF SYSTEMS:    Unable to obtain 2/2 AMS  VITAL SIGNS: BP 106/68   Pulse (!) 54   Temp 98.3 F (36.8 C) (Axillary)   Resp 18   Ht 5\' 10"  (1.778 m)   Wt 165 lb (74.8 kg)   SpO2 98%   BMI 23.68 kg/m   HEMODYNAMICS:    VENTILATOR SETTINGS: Vent Mode: PRVC FiO2 (%):  [50 %] 50 % Set Rate:  [18 bmp] 18 bmp Vt Set:  [540 mL] 540 mL PEEP:  [5 cmH20] 5 cmH20 Plateau Pressure:  [23 [id number] cmH20] 24 cmH20  INTAKE / OUTPUT: I/O last 3 completed shifts: In: 6739.5 [P.O.:1480; I.V.:5019.5; Other:240] Out: 2385 [Urine:2385]  PHYSICAL EXAMINATION: General:  Elderly man chronically ill-appearing Neuro:  Sedated, unresponsive HEENT:  Significant droop of L eye Cardiovascular:  Cool extremities, S3 Lungs:  Mechanical breath sounds Abdomen:  Soft Skin:  Thick, scaly skin. Large eschar on L forearm.  LABS:  BMET  Recent Labs Lab 12/28/15 2334 12/28/15 2350 12/30/15 0619 12/30/15 2150  NA 127* 128* 120* 114*  K 4.2 4.4 5.6* 5.4*  CL 91* 92* 91* 84*  CO2 22  --  17* 23  BUN 11 10 12 16   CREATININE 0.90 0.80 0.74 1.10  GLUCOSE 116* 110* 154* 231*    Electrolytes  Recent Labs Lab 12/28/15 2334 12/30/15 0619 12/30/15 2150  CALCIUM 8.7* 8.7* 8.0*    CBC  Recent Labs Lab 12/28/15 2334 12/28/15 2350 12/30/15 0430  WBC 16.7*  --  13.4*  HGB 15.3 15.6 12.7*  HCT 43.8 46.0 37.2*  PLT 202  --  230    Coag's No results for input(s): APTT, INR in the last 168 hours.  Sepsis Markers  Recent Labs Lab 12/28/15 2340  LATICACIDVEN 5.63*    ABG  Recent Labs Lab 12/30/15 2107  PHART 7.323*  PCO2ART 37.9  PO2ART 106.0*    Liver Enzymes  Recent Labs Lab 12/28/15 2334 12/30/15 2150  AST 27 49*  ALT 18 31  ALKPHOS 73 54  BILITOT 0.8 1.8*  ALBUMIN 4.2 3.3*    Cardiac Enzymes  Recent Labs Lab 12/28/15 2334  TROPONINI 0.04*    Glucose No results for input(s): GLUCAP in the last 168 hours.  Imaging Dg Chest Port 1 View  Result Date:  12/30/2015 CLINICAL DATA:  Chest tube placement.  Initial encounter. EXAM: PORTABLE CHEST 1 VIEW COMPARISON:  Chest radiograph performed earlier today at 8:57 p.m. FINDINGS: The previously noted small left pneumothorax is no longer definitely seen. The left-sided chest tube is seen ending overlying the left hilum. Overlying soft tissue air is again noted. Underlying left-sided rib fractures are better characterized on prior CT. A right PICC is noted ending about the distal SVC. [name] endotracheal tube is seen ending 4 cm above the carina. An enteric tube is noted extending below the diaphragm. Mild right basilar opacity may reflect atelectasis, as it is new from the recent prior study. No pneumothorax is seen. The cardiomediastinal silhouette is mildly enlarged. No acute osseous abnormalities identified. IMPRESSION: 1. Previously noted small left pneumothorax is no longer definitely seen. Left-sided chest tube noted overlying the left hilum. Associated left-sided rib fractures and overlying soft tissue air again noted. 2. Endotracheal tube seen ending 4 cm above the carina. 3. Mild right basilar airspace opacity is new from the recent prior study and may reflect atelectasis. 4. Mild cardiomegaly. Electronically Signed   By: Garald Balding M.D.   On: 12/30/2015 22:49   Dg Chest Port 1 View  Result Date: 12/30/2015 CLINICAL DATA:  Endotracheal tube placement. Orogastric tube placement. Initial encounter. EXAM: PORTABLE CHEST 1 VIEW COMPARISON:  Chest radiograph performed earlier today at 3:31 p.m. FINDINGS: [name] endotracheal tube is seen ending 5 cm above the carina. An enteric tube is noted extending below the diaphragm. Mild vascular congestion is noted. Mild bibasilar opacities may reflect mild interstitial edema. A small left-sided pneumothorax is noted, slightly more apparent than on the prior study. No significant pleural effusion is seen. The cardiomediastinal silhouette is mildly enlarged.  Left-sided rib fractures are again seen, with overlying soft tissue air. IMPRESSION: 1. Endotracheal tube seen ending 5 cm above the carina. 2. Enteric tube seen extending below the diaphragm. 3. Mild vascular congestion and cardiomegaly. Mild bibasilar opacities may reflect mild interstitial edema. 4. Small left-sided pneumothorax is slightly more apparent than on the prior study. 5. Left-sided rib fractures again noted, with overlying soft tissue air. Electronically Signed   By: Garald Balding M.D.   On: 12/30/2015 21:17   Dg Chest Port 1 View  Result Date: 12/30/2015 CLINICAL DATA:  Line placement EXAM: PORTABLE CHEST 1 VIEW COMPARISON:  Portable exam 1531 hours compared to 0432 hours FINDINGS: RIGHT arm PICC line tip projects over SVC. Enlargement of cardiac silhouette with pulmonary vascular congestion. Atherosclerotic calcification aorta. Bibasilar atelectasis. Small LEFT apical pneumothorax seen on previous study less well visualized. No definite infiltrate or pleural effusion. Osseous demineralization with LEFT rib fractures again noted. IMPRESSION: Tip of RIGHT arm PICC line projects over SVC.  Aortic atherosclerosis. Bibasilar atelectasis. Electronically Signed   By: Lavonia Dana M.D.   On: 12/30/2015 15:40   Dg Chest Port 1 View  Result Date: 12/30/2015 CLINICAL DATA:  Patient with history of multiple rib fractures on the left. EXAM: PORTABLE CHEST 1 VIEW COMPARISON:  Chest radiograph 12/29/2015. FINDINGS: Monitoring leads overlie the patient. Stable enlarged cardiac and mediastinal contours. Slight interval increase in mid and lower lung heterogeneous pulmonary opacities. Calcified granuloma right mid lung. Interval increase in subcutaneous emphysema overlying the left chest wall. Multiple displaced left lateral rib fractures are demonstrated. Findings suggestive of small left apical pneumothorax. IMPRESSION: Small left apical pneumothorax. Increased basilar heterogeneous opacities which may  represent atelectasis. Multiple displaced left-sided rib fractures. Increased left chest wall subcutaneous emphysema. Critical Value/emergent results were called by telephone at the time of interpretation on 12/30/2015 at 7:13 am to Nurse Weston Brass, who verbally acknowledged these results. Electronically Signed   By: Lovey Newcomer M.D.   On: 12/30/2015 07:15   Dg Abd Portable 1v  Result Date: 12/30/2015 CLINICAL DATA:  Enteric tube placement.  Initial encounter. EXAM: PORTABLE ABDOMEN - 1 VIEW COMPARISON:  CT of the abdomen and pelvis from 12/28/2015 FINDINGS: [name] enteric tube is seen coiling within the stomach, ending overlying the fundus of the stomach. The visualized bowel gas pattern is grossly unremarkable. No free intra-abdominal air is seen, though evaluation for free air is limited on a single supine view. No acute osseous abnormalities are seen. Mild degenerative change is noted along the lower lumbar spine. IMPRESSION: Enteric tube noted coiling within the stomach, ending overlying the fundus of the stomach. Electronically Signed   By: Garald Balding M.D.   On: 12/30/2015 21:22     STUDIES:  Chest CT: Rib fx noted w/ small PTX Head CT: No CNS injury noted Abd/Pelvis CT: L Acetabulum fx, plus R L3/4 transverse process fx.  CULTURES: None  ANTIBIOTICS: None  SIGNIFICANT EVENTS: Chest Tube inserted 8/12 Intubated 8/12  LINES/TUBES: 8 mm airway 8/12 Epidural 8/11 PIV x2 Triple lumen PICC, 8/12 L Chest tube 8/12 Foley 8/11 OGT 8/12  DISCUSSION: 80 y/o man with multiple medical problems presenting after trauma  ASSESSMENT / PLAN:  PULMONARY A: COPD Need for mechanical ventilation P:   Maintain on PRVC for now, SBT in AM and daily Duonebs as scheduled  CARDIOVASCULAR A:  Ischemic cardiomyopathy, decompensated. 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 Given +7L volume status from admission, suspect  volume overload. Will aggressively diurese, not responding to 40 mg IV, will dose-increase to 80 mg IV. Repeat echo D/c home anti-hypertensives while on pressors. Will d/c phenylephrine in favor of dopamine. Consider inotropic support w/ dobutamine if fails to improve w/ dopamine and diuresis. Trend lactic acid.  RENAL A:   AKI, rising Cr Severe hyponatremia Hyperkalemia P:   Likely due to SIADH from pain, volume overload, and AKI. Will treat underlying cause and limit fluid intake. q4 BMP, consider hypertonic saline if worsens or sz activity  GASTROINTESTINAL A:   Mild elevation in LFTs P:   Likely related to etoh use as well as hypoprofusion.  HEMATOLOGIC A:   Leukocytosis P:  Likely reactive  INFECTIOUS A:   No active issues P:    ENDOCRINE A:   SIADH   P:   Fluid restriction, tx pain, volume overload.  NEUROLOGIC A:   Need for sedation P:   Propofol is a bit of a negative inotrope,  shoot for Rass of 0  FAMILY  - Updates: Updated at bedside  - Inter-disciplinary family meet or Palliative Care meeting due by: 8/20  CRITICAL CARE Performed by: Luz Brazen   Total critical care time: 50 minutes  Critical care time was exclusive of separately billable procedures and treating other patients.  Critical care was necessary to treat or prevent imminent or life-threatening deterioration.  Critical care was time spent personally by me on the following activities: development of treatment plan with patient and/or surrogate as well as nursing, discussions with consultants, evaluation of patient's response to treatment, examination of patient, obtaining history from patient or surrogate, ordering and performing treatments and interventions, ordering and review of laboratory studies, ordering and review of radiographic studies, pulse oximetry and re-evaluation of patient's condition. [place], MD Pulmonary and [place] Pager: [phone]  12/31/2015, 2:01 AM

## 2015-12-31 NOTE — Progress Notes (Signed)
CRITICAL VALUE ALERT  Critical value received:  Sodium 114  Date of notification:  12/31/15  Time of notification:  9:20  Critical value read back:Yes.     Nurse who received alert:   Cruzita Lederer   MD notified (1st page):  Gaylyn Lambert, NP  Time of first page:  0930   MD notified (2nd page):  Time of second page:  Responding MD:  Gaylyn Lambert, NP  Time MD responded:  [id number]

## 2015-12-31 NOTE — Progress Notes (Signed)
PULMONARY / CRITICAL CARE MEDICINE   Name: Chad Rogers MRN: [id number] DOB: August 10, 1935    ADMISSION DATE:  12/28/2015 CONSULTATION DATE:  December 31, 2015  REFERRING MD:  Trauma Md, MD  CHIEF COMPLAINT:  Fall  HISTORY OF PRESENT ILLNESS:   Chad Rogers is a 80 y/o M w/ MMP, most notable for COPD, ischemic cardiomyopathy (EF ~25%), and EtOH abuse who presented as a trauma and was transferred to [facility] with rib fx, PTX, and hip fx. He was placed on CIWA given his significant EtOH hx, and was treated with ativan per protocol. He became more somnolent, and was intubated. PCCM was consulted for decompensation of his MMP.    VITAL SIGNS: BP 103/69   Pulse 90   Temp 98.4 F (36.9 C) (Axillary)   Resp (!) 24   Ht 5\' 10"  (1.778 m)   Wt 165 lb (74.8 kg)   SpO2 100%   BMI 23.68 kg/m   HEMODYNAMICS:    VENTILATOR SETTINGS: Vent Mode: PRVC FiO2 (%):  [50 %-100 %] 90 % Set Rate:  [18 bmp] 18 bmp Vt Set:  [540 mL] 540 mL PEEP:  [5 cmH20-8 cmH20] 8 cmH20 Plateau Pressure:  [23 cmH20-25 cmH20] 25 cmH20  INTAKE / OUTPUT: I/O last 3 completed shifts: In: 8211.8 [P.O.:1360; I.V.:6601.8; Other:250] Out: [id number] [Urine:1585]  PHYSICAL EXAMINATION: General:  Elderly man chronically ill-appearing on vent Neuro:  Sedated, unresponsive HEENT:  Significant droop of L eye Cardiovascular:  Cool extremities, S3 Lungs:  Mechanical breath sounds Abdomen:  Soft Skin:  Thick, scaly skin. Large eschar on L forearm(sikn cancer).  LABS:  BMET  Recent Labs Lab 12/30/15 2150 12/31/15 0330 12/31/15 0845  NA 114* 115* 114*  K 5.4* 5.1 4.6  CL 84* 82* 86*  CO2 23 21* 23  BUN 16 17 18   CREATININE 1.10 1.41* 1.22  GLUCOSE 231* 95 104*    Electrolytes  Recent Labs Lab 12/30/15 2150 12/31/15 0330 12/31/15 0845  CALCIUM 8.0* 8.4* 7.9*    CBC  Recent Labs Lab 12/28/15 2334 12/28/15 2350 12/30/15 0430 12/31/15 0330  WBC 16.7*  --  13.4* 16.2*  HGB 15.3 15.6 12.7* 14.7  HCT  43.8 46.0 37.2* 42.9  PLT 202  --  230 186    Coag's No results for input(s): APTT, INR in the last 168 hours.  Sepsis Markers  Recent Labs Lab 12/28/15 2340 12/31/15 0330 12/31/15 0856  LATICACIDVEN 5.63* 4.0* 1.5    ABG  Recent Labs Lab 12/30/15 2107  PHART 7.323*  PCO2ART 37.9  PO2ART 106.0*    Liver Enzymes  Recent Labs Lab 12/28/15 2334 12/30/15 2150  AST 27 49*  ALT 18 31  ALKPHOS 73 54  BILITOT 0.8 1.8*  ALBUMIN 4.2 3.3*    Cardiac Enzymes  Recent Labs Lab 12/28/15 2334  TROPONINI 0.04*    Glucose No results for input(s): GLUCAP in the last 168 hours.  Imaging Dg Chest Port 1 View  Result Date: 12/31/2015 CLINICAL DATA:  Acute onset of shortness of breath. Desaturation. Initial encounter. EXAM: PORTABLE CHEST 1 VIEW COMPARISON:  Chest radiograph performed 12/30/2015 FINDINGS: [name] endotracheal tube is seen ending 2 cm above the carina. An enteric tube is noted extending below the diaphragm. Vascular congestion is noted. Bibasilar airspace opacities may reflect pulmonary edema or pneumonia, worsened from the prior study. No definite pneumothorax is seen. The cardiomediastinal silhouette is mildly enlarged. Left-sided rib fractures are again noted, with overlying soft tissue air. The left-sided chest tube  is unchanged in appearance. IMPRESSION: 1. Vascular congestion and mild cardiomegaly. Bibasilar airspace opacities may reflect pulmonary edema or pneumonia, worsened from the prior study. 2. Endotracheal tube seen ending 2 cm above the carina. 3. Left-sided chest tube is unchanged in appearance. 4. Left-sided rib fractures again noted, with overlying soft tissue air. Electronically Signed   By: Garald Balding M.D.   On: 12/31/2015 04:09   Dg Chest Port 1 View  Result Date: 12/30/2015 CLINICAL DATA:  Chest tube placement.  Initial encounter. EXAM: PORTABLE CHEST 1 VIEW COMPARISON:  Chest radiograph performed earlier today at 8:57 p.m. FINDINGS:  The previously noted small left pneumothorax is no longer definitely seen. The left-sided chest tube is seen ending overlying the left hilum. Overlying soft tissue air is again noted. Underlying left-sided rib fractures are better characterized on prior CT. A right PICC is noted ending about the distal SVC. [name] endotracheal tube is seen ending 4 cm above the carina. An enteric tube is noted extending below the diaphragm. Mild right basilar opacity may reflect atelectasis, as it is new from the recent prior study. No pneumothorax is seen. The cardiomediastinal silhouette is mildly enlarged. No acute osseous abnormalities identified. IMPRESSION: 1. Previously noted small left pneumothorax is no longer definitely seen. Left-sided chest tube noted overlying the left hilum. Associated left-sided rib fractures and overlying soft tissue air again noted. 2. Endotracheal tube seen ending 4 cm above the carina. 3. Mild right basilar airspace opacity is new from the recent prior study and may reflect atelectasis. 4. Mild cardiomegaly. Electronically Signed   By: Garald Balding M.D.   On: 12/30/2015 22:49   Dg Chest Port 1 View  Result Date: 12/30/2015 CLINICAL DATA:  Endotracheal tube placement. Orogastric tube placement. Initial encounter. EXAM: PORTABLE CHEST 1 VIEW COMPARISON:  Chest radiograph performed earlier today at 3:31 p.m. FINDINGS: [name] endotracheal tube is seen ending 5 cm above the carina. An enteric tube is noted extending below the diaphragm. Mild vascular congestion is noted. Mild bibasilar opacities may reflect mild interstitial edema. A small left-sided pneumothorax is noted, slightly more apparent than on the prior study. No significant pleural effusion is seen. The cardiomediastinal silhouette is mildly enlarged. Left-sided rib fractures are again seen, with overlying soft tissue air. IMPRESSION: 1. Endotracheal tube seen ending 5 cm above the carina. 2. Enteric tube seen extending  below the diaphragm. 3. Mild vascular congestion and cardiomegaly. Mild bibasilar opacities may reflect mild interstitial edema. 4. Small left-sided pneumothorax is slightly more apparent than on the prior study. 5. Left-sided rib fractures again noted, with overlying soft tissue air. Electronically Signed   By: Garald Balding M.D.   On: 12/30/2015 21:17   Dg Chest Port 1 View  Result Date: 12/30/2015 CLINICAL DATA:  Line placement EXAM: PORTABLE CHEST 1 VIEW COMPARISON:  Portable exam 1531 hours compared to 0432 hours FINDINGS: RIGHT arm PICC line tip projects over SVC. Enlargement of cardiac silhouette with pulmonary vascular congestion. Atherosclerotic calcification aorta. Bibasilar atelectasis. Small LEFT apical pneumothorax seen on previous study less well visualized. No definite infiltrate or pleural effusion. Osseous demineralization with LEFT rib fractures again noted. IMPRESSION: Tip of RIGHT arm PICC line projects over SVC. Aortic atherosclerosis. Bibasilar atelectasis. Electronically Signed   By: Lavonia Dana M.D.   On: 12/30/2015 15:40   Dg Abd Portable 1v  Result Date: 12/30/2015 CLINICAL DATA:  Enteric tube placement.  Initial encounter. EXAM: PORTABLE ABDOMEN - 1 VIEW COMPARISON:  CT of the abdomen and  pelvis from 12/28/2015 FINDINGS: [name] enteric tube is seen coiling within the stomach, ending overlying the fundus of the stomach. The visualized bowel gas pattern is grossly unremarkable. No free intra-abdominal air is seen, though evaluation for free air is limited on a single supine view. No acute osseous abnormalities are seen. Mild degenerative change is noted along the lower lumbar spine. IMPRESSION: Enteric tube noted coiling within the stomach, ending overlying the fundus of the stomach. Electronically Signed   By: Garald Balding M.D.   On: 12/30/2015 21:22     STUDIES:  Chest CT: Rib fx noted w/ small PTX Head CT: No CNS injury noted Abd/Pelvis CT: L Acetabulum fx, plus R  L3/4 transverse process fx.  CULTURES: None  ANTIBIOTICS: None  SIGNIFICANT EVENTS: Chest Tube inserted 8/12 Intubated 8/12  LINES/TUBES: 8 mm airway 8/12>> Epidural 8/11 PIV x2 Triple lumen PICC, 8/12>> L Chest tube 8/12>> Foley 8/11 OGT 8/12>>  DISCUSSION: 80 y/o man with multiple medical problems presenting after trauma  ASSESSMENT / PLAN:  PULMONARY A: COPD Need for mechanical ventilation P:   Maintain on PRVC for now, SBT in AM and daily Duonebs as scheduled Check ABG and wean O2 as needed Check [place]  CARDIOVASCULAR A:  Ischemic cardiomyopathy, decompensated. 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 Given +7L volume status from admission, suspect volume overload. Will aggressively diurese, not responding to 40 mg IV, will dose-increase to 80 mg IV. Repeat echo D/c home anti-hypertensives while on pressors. Will d/c phenylephrine in favor of dopamine. Consider inotropic support w/ dobutamine if fails to improve w/ dopamine and diuresis. Trend lactic acid.  RENAL Lab Results  Component Value Date   CREATININE 1.22 12/31/2015   CREATININE 1.41 (H) 12/31/2015   CREATININE 1.10 12/30/2015   CREATININE 0.82 03/15/2013   CREATININE 0.78 03/01/2013   CREATININE 0.75 02/22/2013    Recent Labs Lab 12/30/15 2150 12/31/15 0330 12/31/15 0845  K 5.4* 5.1 4.6     Recent Labs Lab 12/30/15 2150 12/31/15 0330 12/31/15 0845  NA 114* 115* 114*    A:   AKI, rising Cr Severe hyponatremia Hyperkalemia P:   Likely due to SIADH from pain, volume overload, and AKI. Will treat underlying cause and limit fluid intake. q4 BMP, consider hypertonic saline if worsens or sz activity  GASTROINTESTINAL A:   Mild elevation in LFTs P:   Likely related to etoh use as well as hypoprofusion.  HEMATOLOGIC A:   Leukocytosis P:  Likely reactive  INFECTIOUS A:   No active issues P:     ENDOCRINE A:   SIADH   P:   Fluid restriction, tx pain, volume overload.  NEUROLOGIC A:   Need for sedation P:    Rass of 0  FAMILY  - Updates: Updated at bedside  - Inter-disciplinary family meet or Palliative Care meeting due by: 8/20  Richardson Landry Mieczyslaw Stamas ACNP Maryanna Shape PCCM Pager [phone] till 3 pm If no answer page [phone] 12/31/2015, 9:40 AM  12/31/2015, 9:40 AM

## 2015-12-31 NOTE — Progress Notes (Signed)
Found RR set at 24, per MD Ancil Linsey note, RR is 18 on Mechanical Ventilation. Pt Is stable at this time no distress noted.

## 2015-12-31 NOTE — Procedures (Signed)
Arterial Catheter Insertion Procedure Note GUNER AVALO [id number] 19-Mar-1936  Procedure: Insertion of Arterial Catheter  Indications: Blood pressure monitoring and Frequent blood sampling  Procedure Details Consent: Risks of procedure as well as the alternatives and risks of each were explained to the (patient/caregiver).  Consent for procedure obtained. Time Out: Verified patient identification, verified procedure, site/side was marked, verified correct patient position, special equipment/implants available, medications/allergies/relevent history reviewed, required imaging and test results available.  Performed  Maximum sterile technique was used including antiseptics, cap, gloves, gown, hand hygiene, mask and sheet. Skin prep: Chlorhexidine; local anesthetic administered 20 gauge  catheter was inserted into left radial artery using the Seldinger technique.  Evaluation Blood flow good; BP tracing good. Complications: No apparent complications.   Cordella Register 12/31/2015

## 2015-12-31 NOTE — Progress Notes (Signed)
Saw Pt intubated on Fentanyl drip - epidural off. Spoke with Dr Donne Hazel who wanted epidural removed. I removed it, tip OK site OK VSS Per Dr. Donne Hazel Lovenox tomorrow.  Lillia Abed MD

## 2015-12-31 NOTE — Progress Notes (Signed)
CRITICAL VALUE ALERT  Critical value received:  Na 116  Date of notification:  12/31/15  Time of notification:  [id number]   Critical value read back:Yes.    Nurse who received alert:  Sampson Si, RN  MD notified (1st page):  Brand Males, MD  Time of first page:  1326  MD notified (2nd page):  Time of second page:  Responding MD:  Brand Males, MD  Time MD responded:  [id number]  No new orders at this time

## 2016-01-01 ENCOUNTER — Inpatient Hospital Stay (HOSPITAL_COMMUNITY): Payer: Medicare Other

## 2016-01-01 DIAGNOSIS — I472 Ventricular tachycardia: Secondary | ICD-10-CM

## 2016-01-01 DIAGNOSIS — I4729 Other ventricular tachycardia: Secondary | ICD-10-CM

## 2016-01-01 DIAGNOSIS — I255 Ischemic cardiomyopathy: Secondary | ICD-10-CM

## 2016-01-01 DIAGNOSIS — I5023 Acute on chronic systolic (congestive) heart failure: Secondary | ICD-10-CM

## 2016-01-01 DIAGNOSIS — I429 Cardiomyopathy, unspecified: Secondary | ICD-10-CM

## 2016-01-01 DIAGNOSIS — I509 Heart failure, unspecified: Secondary | ICD-10-CM

## 2016-01-01 LAB — GLUCOSE, CAPILLARY
GLUCOSE-CAPILLARY: 119 mg/dL — AB (ref 65–99)
GLUCOSE-CAPILLARY: 140 mg/dL — AB (ref 65–99)
GLUCOSE-CAPILLARY: 133 mg/dL — AB (ref 65–99)
GLUCOSE-CAPILLARY: 134 mg/dL — AB (ref 65–99)
Glucose-Capillary: 135 mg/dL — ABNORMAL HIGH (ref 65–99)
Glucose-Capillary: 119 mg/dL — ABNORMAL HIGH (ref 65–99)

## 2016-01-01 LAB — BLOOD GAS, ARTERIAL
ACID-BASE DEFICIT: 1.3 mmol/L (ref 0.0–2.0)
Bicarbonate: 22.9 mEq/L (ref 20.0–24.0)
DRAWN BY: 441661
FIO2: 70
O2 Saturation: 96.2 %
PEEP: 8 cmH2O
Patient temperature: 98.6
RATE: 18 resp/min
TCO2: 24.1 mmol/L (ref 0–100)
VT: 540 mL
pCO2 arterial: 38.8 mmHg (ref 35.0–45.0)
pH, Arterial: 7.389 (ref 7.350–7.450)
pO2, Arterial: 89.2 mmHg (ref 80.0–100.0)

## 2016-01-01 LAB — BASIC METABOLIC PANEL
Anion gap: 6 (ref 5–15)
Anion gap: 7 (ref 5–15)
BUN: 15 mg/dL (ref 6–20)
BUN: 17 mg/dL (ref 6–20)
CHLORIDE: 88 mmol/L — AB (ref 101–111)
CHLORIDE: 90 mmol/L — AB (ref 101–111)
CO2: 21 mmol/L — ABNORMAL LOW (ref 22–32)
CO2: 23 mmol/L (ref 22–32)
CREATININE: 0.85 mg/dL (ref 0.61–1.24)
CREATININE: 0.96 mg/dL (ref 0.61–1.24)
Calcium: 7.8 mg/dL — ABNORMAL LOW (ref 8.9–10.3)
Calcium: 8.2 mg/dL — ABNORMAL LOW (ref 8.9–10.3)
GFR calc Af Amer: >60 mL/min (ref >60)
GFR calc Af Amer: >60 mL/min (ref >60)
GFR calc non Af Amer: >60 mL/min (ref >60)
GFR calc non Af Amer: >60 mL/min (ref >60)
GLUCOSE: 112 mg/dL — AB (ref 65–99)
GLUCOSE: 117 mg/dL — AB (ref 65–99)
POTASSIUM: 4.5 mmol/L (ref 3.5–5.1)
POTASSIUM: 4.6 mmol/L (ref 3.5–5.1)
SODIUM: 120 mmol/L — AB (ref 135–145)
Sodium: 115 mmol/L — CL (ref 135–145)

## 2016-01-01 LAB — CBC
HEMATOCRIT: 37.4 % — AB (ref 39.0–52.0)
Hemoglobin: 13.3 g/dL (ref 13.0–17.0)
MCH: 33.8 pg (ref 26.0–34.0)
MCHC: 35.6 g/dL (ref 30.0–36.0)
MCV: 94.9 fL (ref 78.0–100.0)
PLATELETS: 151 10*3/uL (ref 150–400)
RBC: 3.94 MIL/uL — ABNORMAL LOW (ref 4.22–5.81)
RDW: 13.3 % (ref 11.5–15.5)
WBC: 9.5 10*3/uL (ref 4.0–10.5)

## 2016-01-01 LAB — ECHOCARDIOGRAM COMPLETE
Height: 70 in
Weight: 2871.27 oz

## 2016-01-01 LAB — MAGNESIUM
MAGNESIUM: 1.7 mg/dL (ref 1.7–2.4)
Magnesium: 1.7 mg/dL (ref 1.7–2.4)

## 2016-01-01 LAB — PHOSPHORUS
Phosphorus: 2.1 mg/dL — ABNORMAL LOW (ref 2.5–4.6)
Phosphorus: 1.7 mg/dL — ABNORMAL LOW (ref 2.5–4.6)

## 2016-01-01 MED ORDER — ANTISEPTIC ORAL RINSE SOLUTION (CORINZ)
7.0000 mL | OROMUCOSAL | Status: DC
  Administered 2016-01-01 – 2016-01-10 (×89): 7 mL via OROMUCOSAL

## 2016-01-01 MED ORDER — VITAL AF 1.2 CAL PO LIQD
1000.0000 mL | ORAL | Status: DC
  Administered 2016-01-01 – 2016-01-11 (×12): 1000 mL
  Filled 2016-01-01 (×2): qty 1000

## 2016-01-01 MED ORDER — FUROSEMIDE 10 MG/ML IJ SOLN
40.0000 mg | Freq: Once | INTRAMUSCULAR | Status: AC
  Administered 2016-01-01: 40 mg via INTRAVENOUS
  Filled 2016-01-01: qty 4

## 2016-01-01 MED ORDER — BISACODYL 10 MG RE SUPP
10.0000 mg | Freq: Every day | RECTAL | Status: DC | PRN
  Administered 2016-01-01 – 2016-01-06 (×3): 10 mg via RECTAL
  Filled 2016-01-01 (×3): qty 1

## 2016-01-01 MED ORDER — ENOXAPARIN SODIUM 40 MG/0.4ML ~~LOC~~ SOLN
40.0000 mg | SUBCUTANEOUS | Status: DC
  Administered 2016-01-02 – 2016-01-19 (×18): 40 mg via SUBCUTANEOUS
  Filled 2016-01-01 (×18): qty 0.4

## 2016-01-01 MED ORDER — CHLORHEXIDINE GLUCONATE 0.12% ORAL RINSE (MEDLINE KIT)
15.0000 mL | Freq: Two times a day (BID) | OROMUCOSAL | Status: DC
  Administered 2016-01-01 – 2016-01-10 (×18): 15 mL via OROMUCOSAL

## 2016-01-01 NOTE — Progress Notes (Signed)
Pt noted to have an increased frequency of couplet and triplet PCVs.  NP notified.  Pt. Currently on dopamine BP 107/60 with a MAP of 70. Will continue to monitor.   Verdell Face, RN

## 2016-01-01 NOTE — Consult Note (Signed)
Cardiology Consult    Patient ID: Chad Rogers MRN: [id number], DOB/AGE: 78-Aug-1937   Admit date: 12/28/2015 Date of Consult: 01/01/2016  Primary Physician: Chad Neighbors, MD Primary Cardiologist: Dr. Lattie Rogers Requesting Provider: Dr. Grandville Rogers Reason for Consultation: CHF  Patient Profile    80 yo male with PMH of ETOH/Tobacco abuse/NICM EF 15-20% 2014/COPD/HTN/OA and Pulmonary Fibrosis who presented to [facility] after a fall at home. Found to have multiple left-sided rib fractures, L3-4 transverse process fracture, left acetabular fracture and left pneumothorax. He was transferred to [facility] under trauma services.   Past Medical History   Past Medical History:  Diagnosis Date  . Alcohol abuse    family reports that [pt will drink at least 6 beers or more a day,   . Allergic rhinitis   . Cardiomyopathy [phone]   Acute systolic CHF at presentation; 05/2009 cardiac cath- 06/01/09  non obst coronary artery disease with EF 25%  . CHF (congestive heart failure) ([place])   . COPD (chronic obstructive pulmonary disease) ([place])   . Coronary artery disease   . Hypertension   . Osteoarthritis   . Psoriasis   . Pulmonary fibrosis ([place])    12/2004 with basilar fibrotic changes;  PFT's 06/07/09 FEV1 1.98 (68%) raio 53 no better after B2,  DLC0105%  . Skin cancer   . Tobacco abuse    100 pack years; quit    Past Surgical History:  Procedure Laterality Date  . HAND SURGERY    . INGUINAL HERNIA REPAIR    . ROTATOR CUFF REPAIR       Allergies  No Known Allergies  History of Present Illness    Chad Rogers is a 80 year old male with past medical history of ETOH/Tobacco abuse/NICM EF 15-20% 2014/COPD/HTN/OA and Pulmonary Fibrosis. He has been seen in the past by Dr. Stanford Rogers with his last visit being 04/03/2010. At that visit his last cardiac catheterization in 06/01/2009 showed nonobstructive coronary artery disease and an ejection fraction of 25%. That time the plan was to  continue his current medications which were lisinopril 20 mg daily, of note his beta blocker was discontinued previously by Dr. [place] Rogers. Dr. Stanford Rogers did discuss the importance of ICD placement with the patient given his reduced EF, but at that time the patient did not wish to proceed with ICD placement at that point. Other medications included Lasix 20 mg daily and 81 mg aspirin. He was later seen by Dr. Lattie Rogers 2 years following his last visit with Dr. Stanford Rogers complaining of progressive dyspnea. At that time he reported a 2 month history of progressive dyspnea and was unable to tolerate task at work as well as he had in the past. He did report plans to pursue smoking cessation, and repeat 2-D echocardiogram was ordered at that time which showed a further decreased EF of 15-20%. At that visit his carvedilol was increased to 6.25 mg twice a day.   His last visit was with Dr. Harrington Rogers on 02/22/2013 where he complained of lower leg edema and cramping, and reported that he was able to afford his cardiology appointment follow ups. It was noted that his pain status appeared mildly increased at that time. The plan was to continue him on his current medications with a follow-up appointment made for 6 weeks. It does not appear that the patient kept this appointment.  Most recently he was admitted to trauma services at [facility] after sustaining a fall on wet ground outside his house on 12/29/2015.  He sustained multiple left-sided rib fractures, L3-L4 transverse process fracture, left acetabular fracture, and left pneumothorax. He was initially placed on Cipro protocol, but had an change in his mental status ultimately requiring intubation. Also received a left chest tube given his worsening left pneumothorax. Since his admission he is not +8 L. Repeat 2-D echo is currently pending. His labs show sodium of 120, stable hemoglobin 13.3, BNP 1963.7, and chest x-ray shows cardiomegaly with pulmonary vascular congestion  suggesting congestive heart failure. He currently remains intubated and sedated.   Inpatient Medications    . antiseptic oral rinse  7 mL Mouth Rinse QID  . antiseptic oral rinse  7 mL Mouth Rinse QID  . atropine  0.4 mg Intravenous Once  . chlorhexidine gluconate (SAGE KIT)  15 mL Mouth Rinse BID  . chlorhexidine gluconate (SAGE KIT)  15 mL Mouth Rinse BID  . [START ON 01/02/2016] enoxaparin (LOVENOX) injection  40 mg Subcutaneous Q24H  . famotidine (PEPCID) IV  20 mg Intravenous Q24H  . folic acid  1 mg Oral Daily  . furosemide  40 mg Intravenous Once  . ipratropium-albuterol  3 mL Nebulization Q4H  . LORazepam  0-4 mg Intravenous Q12H  . multivitamin with minerals  1 tablet Oral Daily  . sertraline  50 mg Oral Daily  . sodium chloride flush  10-40 mL Intracatheter Q12H  . thiamine  100 mg Oral Daily   Or  . thiamine  100 mg Intravenous Daily    Family History    Family History  Problem Relation Age of Onset  . Cancer Mother 30    gynecologic  . Aneurysm Father 57    cerebral aneurysm  . Asthma Maternal Grandmother     Social History    Social History   Social History  . Marital status: Married    Spouse name: N/A  . Number of children: N/A  . Years of education: N/A   Occupational History  . Driver     [place] History Main Topics  . Smoking status: Current Every Day Smoker    Packs/day: 2.00    Years: 50.00    Types: Cigarettes  . Smokeless tobacco: Former Systems developer  . Alcohol use 17.5 oz/week    35 Standard drinks or equivalent per week     Comment: at least 6 beers or more a day,   . Drug use: Unknown  . Sexual activity: Not on file   Other Topics Concern  . Not on file   Social History Narrative  . No narrative on file     Review of Systems    Obtained from Chart:   General:  No chills, fever, night sweats or weight changes.  Cardiovascular: See HPI Dermatological: No rash, lesions/masses Respiratory: See HPI Urologic: No  hematuria, dysuria Abdominal:   No nausea, vomiting, diarrhea, bright red blood per rectum, melena, or hematemesis Neurologic:  No visual changes, wkns, changes in mental status. All other systems reviewed and are otherwise negative except as noted above.  Physical Exam    Blood pressure (!) 105/57, pulse (!) 45, temperature 99 F (37.2 C), temperature source Axillary, resp. rate (!) 28, height 5' 10"  (1.778 m), weight 179 lb 7.3 oz (81.4 kg), SpO2 99 %.  General: Intubated and sedated on the vent, NAD Psych: Blunted. Neuro: Alert and oriented X 3. Moves all extremities spontaneously. HEENT: Normal  Neck: Supple without bruits or JVD. Lungs:  Resp regular and unlabored on the ventilator, course  crackles noted throughout. CT noted to left lateral chest Heart: RRR no s3, s4, or murmurs. Abdomen: Soft, non-tender, non-distended, BS + x 4.  Extremities: No clubbing, cyanosis or edema. DP/PT/Radials 2+ and equal bilaterally.  Labs    Troponin (Point of Care Test) No results for input(s): TROPIPOC in the last 72 hours. No results for input(s): CKTOTAL, CKMB, TROPONINI in the last 72 hours. Lab Results  Component Value Date   WBC 9.5 01/01/2016   HGB 13.3 01/01/2016   HCT 37.4 (L) 01/01/2016   MCV 94.9 01/01/2016   PLT 151 01/01/2016    Recent Labs Lab 12/30/15 2150  01/01/16 0340  NA 114*  < > 120*  K 5.4*  < > 4.6  CL 84*  < > 90*  CO2 23  < > 23  BUN 16  < > 15  CREATININE 1.10  < > 0.85  CALCIUM 8.0*  < > 8.2*  PROT 5.6*  --   --   BILITOT 1.8*  --   --   ALKPHOS 54  --   --   ALT 31  --   --   AST 49*  --   --   GLUCOSE 231*  < > 117*  < > = values in this interval not displayed. Lab Results  Component Value Date   CHOL 152 09/05/2011   HDL 52 09/05/2011   LDLCALC 86 09/05/2011   TRIG 38 12/30/2015   No results found for: [facility]   Radiology Studies    Ct Head Wo Contrast  Result Date: 12/29/2015 CLINICAL DATA:  Acute onset of left rib and back pain, status  post fall down steps. Concern for head or cervical spine injury. Initial encounter. EXAM: CT HEAD WITHOUT CONTRAST CT CERVICAL SPINE WITHOUT CONTRAST TECHNIQUE: Multidetector CT imaging of the head and cervical spine was performed following the standard protocol without intravenous contrast. Multiplanar CT image reconstructions of the cervical spine were also generated. COMPARISON:  MRI of the brain performed 03/20/2005 FINDINGS: CT HEAD FINDINGS There is no evidence of acute infarction, mass lesion, or intra- or extra-axial hemorrhage on CT. Bilateral subdural hygromas are again noted. There is underlying mild cortical volume loss. Mild cerebellar atrophy is noted. Scattered periventricular and subcortical white matter change likely reflects small vessel ischemic microangiopathy. The brainstem and fourth ventricle are within normal limits. The basal ganglia are unremarkable in appearance. The cerebral hemispheres demonstrate grossly normal gray-white differentiation. No mass effect or midline shift is seen. There is no evidence of fracture; visualized osseous structures are unremarkable in appearance. The orbits are within normal limits. The paranasal sinuses and mastoid air cells are well-aerated. No significant soft tissue abnormalities are seen. CT CERVICAL SPINE FINDINGS There is no evidence of fracture or subluxation. Vertebral bodies demonstrate normal height and alignment. Mild multilevel disc space narrowing is noted along the lower cervical spine, with scattered anterior and posterior disc osteophyte complexes, and underlying facet disease along the cervical spine. Prevertebral soft tissues are within normal limits. The thyroid gland is unremarkable in appearance. The visualized lung apices are clear. Scattered calcification is noted at the carotid bifurcations bilaterally, with likely moderate right-sided luminal narrowing. IMPRESSION: 1. No evidence of traumatic intracranial injury or fracture. 2. No  evidence of fracture or subluxation along the cervical spine. 3. Mild cortical volume loss and scattered small vessel ischemic microangiopathy. 4. Bilateral subdural hygromas again noted. 5. Mild degenerative change along the cervical spine. 6. Scattered calcification at the carotid bifurcations bilaterally, with  likely moderate right-sided luminal narrowing. Carotid ultrasound would be helpful for further evaluation, when and as deemed clinically appropriate. Electronically Signed   By: Garald Balding M.D.   On: 12/29/2015 01:07   Ct Chest W Contrast  Result Date: 12/29/2015 CLINICAL DATA:  80 year old male with acute left chest pain, abdominal and pelvic pain following fall. EXAM: CT CHEST, ABDOMEN, AND PELVIS WITH CONTRAST TECHNIQUE: Multidetector CT imaging of the chest, abdomen and pelvis was performed following the standard protocol during bolus administration of intravenous contrast. CONTRAST:  132m ISOVUE-300 IOPAMIDOL (ISOVUE-300) INJECTION 61% COMPARISON:  Prior radiographs FINDINGS: CT CHEST FINDINGS Cardiovascular: Cardiomegaly and coronary artery calcifications noted. There is no evidence of thoracic aortic aneurysm. Mediastinum/Nodes: No mediastinal mass, enlarged lymph nodes or pericardial effusion. Lungs/Pleura: A small anterior inferior left pneumothorax identified. Mild dependent and basilar atelectasis noted. No airspace disease, suspicious nodule, mass or consolidation identified. Musculoskeletal: There are acute fractures of the left third through twelfth ribs. Fractures of the lateral fourth through seventh ribs are displaced. Left subcutaneous emphysema is identified. CT ABDOMEN PELVIS FINDINGS Hepatobiliary: Hepatic steatosis identified. The gallbladder is unremarkable. There is no evidence of biliary dilatation. Pancreas: Unremarkable Spleen: Unremarkable Adrenals/Urinary Tract: The kidneys, adrenal glands and bladder are unremarkable. Stomach/Bowel: No bowel obstruction or definite  bowel wall thickening. The appendix is normal. Vascular/Lymphatic: Heavy abdominal aortic atherosclerotic calcifications noted without aneurysm. No enlarged lymph nodes identified. Reproductive: Unremarkable Other: No free fluid or pneumoperitoneum. A small to moderate right inguinal hernia containing small bowel noted without bowel obstruction. A small left inguinal hernia containing fat is present. Musculoskeletal: A nondisplaced fracture of the anterior left acetabulum noted. Nondisplaced fractures of the right L3 and L4 transverse process is noted. IMPRESSION: Acute fractures of the left third through twelfth ribs with small left pneumothorax and left subcutaneous emphysema. Acute nondisplaced fractures of the anterior left acetabulum and right L3 and L4 transverse processes. No evidence of solid or hollow visceral injury. Cardiomegaly and coronary artery disease. Hepatic steatosis. Abdominal aortic atherosclerosis. Critical Value/emergent results were called by telephone at the time of interpretation on 12/29/2015 at 1:08 am to Dr. DDelora Fuel, who verbally acknowledged these results. Electronically Signed   By: JMargarette CanadaM.D.   On: 12/29/2015 01:08   Ct Cervical Spine Wo Contrast  Result Date: 12/29/2015 CLINICAL DATA:  Acute onset of left rib and back pain, status post fall down steps. Concern for head or cervical spine injury. Initial encounter. EXAM: CT HEAD WITHOUT CONTRAST CT CERVICAL SPINE WITHOUT CONTRAST TECHNIQUE: Multidetector CT imaging of the head and cervical spine was performed following the standard protocol without intravenous contrast. Multiplanar CT image reconstructions of the cervical spine were also generated. COMPARISON:  MRI of the brain performed 03/20/2005 FINDINGS: CT HEAD FINDINGS There is no evidence of acute infarction, mass lesion, or intra- or extra-axial hemorrhage on CT. Bilateral subdural hygromas are again noted. There is underlying mild cortical volume loss. Mild  cerebellar atrophy is noted. Scattered periventricular and subcortical white matter change likely reflects small vessel ischemic microangiopathy. The brainstem and fourth ventricle are within normal limits. The basal ganglia are unremarkable in appearance. The cerebral hemispheres demonstrate grossly normal gray-white differentiation. No mass effect or midline shift is seen. There is no evidence of fracture; visualized osseous structures are unremarkable in appearance. The orbits are within normal limits. The paranasal sinuses and mastoid air cells are well-aerated. No significant soft tissue abnormalities are seen. CT CERVICAL SPINE FINDINGS There is no evidence of fracture or  subluxation. Vertebral bodies demonstrate normal height and alignment. Mild multilevel disc space narrowing is noted along the lower cervical spine, with scattered anterior and posterior disc osteophyte complexes, and underlying facet disease along the cervical spine. Prevertebral soft tissues are within normal limits. The thyroid gland is unremarkable in appearance. The visualized lung apices are clear. Scattered calcification is noted at the carotid bifurcations bilaterally, with likely moderate right-sided luminal narrowing. IMPRESSION: 1. No evidence of traumatic intracranial injury or fracture. 2. No evidence of fracture or subluxation along the cervical spine. 3. Mild cortical volume loss and scattered small vessel ischemic microangiopathy. 4. Bilateral subdural hygromas again noted. 5. Mild degenerative change along the cervical spine. 6. Scattered calcification at the carotid bifurcations bilaterally, with likely moderate right-sided luminal narrowing. Carotid ultrasound would be helpful for further evaluation, when and as deemed clinically appropriate. Electronically Signed   By: Garald Balding M.D.   On: 12/29/2015 01:07   Ct Abdomen Pelvis W Contrast  Result Date: 12/29/2015 CLINICAL DATA:  80 year old male with acute left  chest pain, abdominal and pelvic pain following fall. EXAM: CT CHEST, ABDOMEN, AND PELVIS WITH CONTRAST TECHNIQUE: Multidetector CT imaging of the chest, abdomen and pelvis was performed following the standard protocol during bolus administration of intravenous contrast. CONTRAST:  135m ISOVUE-300 IOPAMIDOL (ISOVUE-300) INJECTION 61% COMPARISON:  Prior radiographs FINDINGS: CT CHEST FINDINGS Cardiovascular: Cardiomegaly and coronary artery calcifications noted. There is no evidence of thoracic aortic aneurysm. Mediastinum/Nodes: No mediastinal mass, enlarged lymph nodes or pericardial effusion. Lungs/Pleura: A small anterior inferior left pneumothorax identified. Mild dependent and basilar atelectasis noted. No airspace disease, suspicious nodule, mass or consolidation identified. Musculoskeletal: There are acute fractures of the left third through twelfth ribs. Fractures of the lateral fourth through seventh ribs are displaced. Left subcutaneous emphysema is identified. CT ABDOMEN PELVIS FINDINGS Hepatobiliary: Hepatic steatosis identified. The gallbladder is unremarkable. There is no evidence of biliary dilatation. Pancreas: Unremarkable Spleen: Unremarkable Adrenals/Urinary Tract: The kidneys, adrenal glands and bladder are unremarkable. Stomach/Bowel: No bowel obstruction or definite bowel wall thickening. The appendix is normal. Vascular/Lymphatic: Heavy abdominal aortic atherosclerotic calcifications noted without aneurysm. No enlarged lymph nodes identified. Reproductive: Unremarkable Other: No free fluid or pneumoperitoneum. A small to moderate right inguinal hernia containing small bowel noted without bowel obstruction. A small left inguinal hernia containing fat is present. Musculoskeletal: A nondisplaced fracture of the anterior left acetabulum noted. Nondisplaced fractures of the right L3 and L4 transverse process is noted. IMPRESSION: Acute fractures of the left third through twelfth ribs with small  left pneumothorax and left subcutaneous emphysema. Acute nondisplaced fractures of the anterior left acetabulum and right L3 and L4 transverse processes. No evidence of solid or hollow visceral injury. Cardiomegaly and coronary artery disease. Hepatic steatosis. Abdominal aortic atherosclerosis. Critical Value/emergent results were called by telephone at the time of interpretation on 12/29/2015 at 1:08 am to Dr. DDelora Fuel, who verbally acknowledged these results. Electronically Signed   By: JMargarette CanadaM.D.   On: 12/29/2015 01:08   Dg Pelvis Comp Min 3v  Result Date: 01/01/2016 CLINICAL DATA:  Fall, pelvic fractures. EXAM: JUDET PELVIS - 3+ VIEW COMPARISON:  CT 12/28/2015 FINDINGS: Previously seen left anterior acetabular fractures are better seen by CT. Fractures are not visible by plain film. No additional fracture noted. Degenerative changes in the hips with joint space narrowing and spurring. IMPRESSION: Previously seen left anterior acetabular fracture is not visible by plain films. Mild degenerative changes in the hips bilaterally. Electronically Signed   By:  Rolm Baptise M.D.   On: 01/01/2016 11:13   Dg Chest Port 1 View  Result Date: 01/01/2016 CLINICAL DATA:  Intubation. EXAM: PORTABLE CHEST 1 VIEW COMPARISON:  12/31/2015. FINDINGS: Endotracheal tube, NG tube, right PICC line, left chest tube in stable position. Cardiomegaly with pulmonary vascular prominence and bilateral interstitial prominence consistent congestive heart failure. Bibasilar atelectasis. No pneumothorax. Multiple left displaced rib fractures again noted. Left chest wall subcutaneous emphysema is again noted . IMPRESSION: 1. Lines and tubes including left chest tube in stable position. No pneumothorax. 2. Cardiomegaly with pulmonary vascular prominence and bilateral interstitial prominence suggesting congestive heart failure. Bibasilar atelectasis . 3. Multiple left rib fractures again noted. Left chest wall subcutaneous emphysema  again noted. Electronically Signed   By: Marcello Moores  Register   On: 01/01/2016 07:24    Dg Chest Port 1 View  Result Date: 12/31/2015 CLINICAL DATA:  Acute onset of shortness of breath. Desaturation. Initial encounter. EXAM: PORTABLE CHEST 1 VIEW COMPARISON:  Chest radiograph performed 12/30/2015 FINDINGS: [name] endotracheal tube is seen ending 2 cm above the carina. An enteric tube is noted extending below the diaphragm. Vascular congestion is noted. Bibasilar airspace opacities may reflect pulmonary edema or pneumonia, worsened from the prior study. No definite pneumothorax is seen. The cardiomediastinal silhouette is mildly enlarged. Left-sided rib fractures are again noted, with overlying soft tissue air. The left-sided chest tube is unchanged in appearance. IMPRESSION: 1. Vascular congestion and mild cardiomegaly. Bibasilar airspace opacities may reflect pulmonary edema or pneumonia, worsened from the prior study. 2. Endotracheal tube seen ending 2 cm above the carina. 3. Left-sided chest tube is unchanged in appearance. 4. Left-sided rib fractures again noted, with overlying soft tissue air. Electronically Signed   By: Garald Balding M.D.   On: 12/31/2015 04:09    Dg Abd Portable 1v  Result Date: 12/30/2015 CLINICAL DATA:  Enteric tube placement.  Initial encounter. EXAM: PORTABLE ABDOMEN - 1 VIEW COMPARISON:  CT of the abdomen and pelvis from 12/28/2015 FINDINGS: [name] enteric tube is seen coiling within the stomach, ending overlying the fundus of the stomach. The visualized bowel gas pattern is grossly unremarkable. No free intra-abdominal air is seen, though evaluation for free air is limited on a single supine view. No acute osseous abnormalities are seen. Mild degenerative change is noted along the lower lumbar spine. IMPRESSION: Enteric tube noted coiling within the stomach, ending overlying the fundus of the stomach. Electronically Signed   By: Garald Balding M.D.   On: 12/30/2015  21:22    ECG & Cardiac Imaging    EKG: Sinus rhythm with first-degree AV block, RBBB, LAFB and PVCs  Echo: 03/12/2013  Study Conclusions  - Left ventricle: Systolic function is severely reduced, estimated EF 15-20%. Severe global hypokinesis is seen. The cavity size was mildly to moderately dilated. Wall thickness was normal. There was a reduced contribution of atrial contraction to ventricular filling, due to increased ventricular diastolic pressure or atrial contractile dysfunction. Doppler parameters are consistent with restrictive physiology, indicative of decreased left ventricular diastolic compliance and/or increased left atrial pressure. Doppler parameters are consistent with high ventricular filling pressure. - Ventricular septum: The contour showed systolic flattening, indicative of pressure overload. - Aortic valve: Mildly calcified annulus. Trileaflet. There was no stenosis. - Mitral valve: Calcified annulus. Mildly thickened leaflets . Mild regurgitation. - Left atrium: The atrium was moderately dilated. - Right ventricle: The cavity size was mildly dilated. Systolic function was moderately reduced. - Right atrium: The atrium was  mildly dilated. Central venous pressure: 56m Hg (est). - Tricuspid valve: Mild regurgitation. - Pulmonary arteries: PA peak pressure: 46mHg (S). - Inferior vena cava: The vessel was dilated; the respirophasic diameter changes were blunted (< 50%); findings are consistent with elevated central venous pressure.  Assessment & Plan    Mr. DaVoldens a [age]ear old male with past medical history of ETOH/Tobacco abuse/NICM EF 15-20% 2014/COPD/HTN/OA and Pulmonary Fibrosis. [facility] with noted multiple left-sided rib fractures, left-sided acetabular fracture on the L3-L4 transverse process fracture and left-sided pneumothorax. Subsequently  required intubation and left-sided chest tube.  1. NICM/ Combined CHF: His office visits have been sporadic over the past couple of years, with last visit being in 2013 with Dr. RoLattie HawHe does see Dr. CrStanford Breedn the past to noted his EF at 25% and recommended consideration of ICD placement which the patient denied that time. He was followed up in 2013 with Dr. RoLattie Hawho did a repeat 2-D echocardiogram which showed a further decrease EF of 15-20%. At that time he was continued on carvedilol 6.25 mg twice a day with plans to follow-up, which the patient did not appear to do.  -- He is currently not +8 L since admission, lungs exhibit coarse crackles and rhonchi throughout. He has been given one dose of IV Lasix 40 mg. He is currently on a dopamine drip, with attempts to wean down with his last pressure being 138/45. Will defer to MD for further diuresis.  -- Ideally would like to placed back on his home dose of coreg, but his is currently requiring dopamine for pressure support.  -- 2D echo pending.   2. Rib fractures, left-sided acetabular fracture on the L3-L4 transverse process fracture and left-sided pneumothorax: Per trauma  3. Respiratory failure: PCCM following  Signed, LiReino BellisNP-C Pager [phone]/14/2017, 11:34 AM   Pt seen and  Examined  I agree with findings as noted above by L Roberts   Pt is a [age]o with known NICM  Admitted after fall.  Multiple fracturesand pneumothorax  Asked to see re CHF    ON exam pt with evidence of volume overload   Lungs with rhonchi  Cardiac exam with RRR  No S3  No signif murmurs  Ext with diffuse edema  PT is 8 L positive    EKG with SR with PACS/PVCs  Some idioventricular rhyhtm  QRS is very wide consistent with pts severe cardiomyopathy  There are frequent PVCs (note that pt has declined ICD in past)  Would diurese with IV lasix  Has put out about 1 L with 40 IV lasix  Keep electrolytes optimized to minimze arrhythmia risk  Wean DA  (currently on 2 mcg/kg/min).  PaDorris Carnes

## 2016-01-01 NOTE — Progress Notes (Signed)
E link made aware of Na of 115

## 2016-01-01 NOTE — Progress Notes (Signed)
ABG collected  

## 2016-01-01 NOTE — Care Management Note (Signed)
Case Management Note  Patient Details  Name: Chad Rogers MRN: [id number] Date of Birth: 09-19-1935  Subjective/Objective:  Pt admitted on 12/28/15 s/p fall down steps with multiple rib fx, PTX, L3-4 TVP fx, and Lt acetabular fx.  PTA, pt independent, lives with spouse.                    Action/Plan: Pt currently sedated and on ventilator.  Will follow for discharge planning as pt progresses.    Expected Discharge Date:                  Expected Discharge Plan:  [place]  In-House Referral:     Discharge planning Services  CM Consult  Post Acute Care Choice:    Choice offered to:     DME Arranged:    DME Agency:     HH Arranged:    [place] Agency:     Status of Service:  In process, will continue to follow  If discussed at Long Length of Stay Meetings, dates discussed:    Additional Comments:  Reinaldo Raddle, RN, BSN  Trauma/Neuro ICU Case Manager [phone]

## 2016-01-01 NOTE — Progress Notes (Addendum)
Patient ID: Chad Rogers, male   DOB: [birth year], 80 y.o.   MRN: [id number] Follow up - Trauma Critical Care  Patient Details:    Chad Rogers is an 80 y.o. male.  Lines/tubes : Airway 8 mm (Active)  Secured at (cm) 23 cm 01/01/2016  7:13 AM  Measured From Lips 01/01/2016  7:13 AM  Secured Location Left 01/01/2016  7:13 AM  Secured By [employer] 01/01/2016  7:13 AM  Tube Holder Repositioned Yes 01/01/2016  7:13 AM  Cuff Pressure (cm H2O) 26 cm H2O 01/01/2016  7:13 AM  Site Condition Dry 01/01/2016  7:13 AM     PICC Triple Lumen [id number] PICC Right Basilic 38 cm 0 cm (Active)  Indication for Insertion or Continuance of Line Vasoactive infusions 12/31/2015  8:00 PM  Exposed Catheter (cm) 0 cm 12/30/2015  3:21 PM  Site Assessment Clean;Dry;Intact 12/31/2015  8:00 PM  Lumen #1 Status Infusing 12/31/2015  8:00 PM  Lumen #2 Status Infusing 12/31/2015  8:00 PM  Lumen #3 Status Infusing 12/31/2015  8:00 PM  Dressing Type Transparent;Occlusive 12/31/2015  8:00 PM  Dressing Status Clean;Dry;Intact;Antimicrobial disc in place 12/31/2015  8:00 PM  Line Care Connections checked and tightened 12/31/2015  8:00 PM  Dressing Change Due 01/06/16 12/31/2015  8:00 PM     Arterial Line 12/31/15 Left Radial (Active)  Site Assessment Clean;Dry;Intact 12/31/2015  8:00 PM  Line Status Pulsatile blood flow;Positional 12/31/2015  8:00 PM  Art Line Waveform Square wave test performed;Dampened 12/31/2015  8:00 PM  Art Line Interventions Zeroed and calibrated;Connections checked and tightened 12/31/2015  8:00 PM  Color/Movement/Sensation Capillary refill less than 3 sec 12/31/2015  8:00 PM  Dressing Type Transparent;Occlusive 12/31/2015  8:00 PM  Dressing Status Clean;Dry;Intact 12/31/2015  8:00 PM     Chest Tube Left (Active)  Suction -20 cm H2O 01/01/2016 12:00 AM  Chest Tube Air Leak Minimal 01/01/2016 12:00 AM  Patency Intervention Tip/tilt 01/01/2016 12:00 AM  Drainage Description Dark red 01/01/2016 12:00  AM  Dressing Status Intact;Dry;Old drainage 01/01/2016 12:00 AM  Dressing Intervention New dressing 12/30/2015 10:00 PM  Site Assessment Other (Comment) 12/31/2015  4:00 PM  Surrounding Skin Intact 01/01/2016 12:00 AM     NG/OG Tube Orogastric 16 Fr. Right mouth (Active)  Placement Verification Auscultation 12/31/2015  8:00 PM  Site Assessment Clean;Dry;Intact 12/31/2015  8:00 PM  Status Infusing tube feed 12/31/2015  8:00 PM  Output (mL) 160 mL 01/01/2016  5:00 AM     Urethral Catheter Darla Anastasia Fiedler RN Non-latex 16 Fr. (Active)  Indication for Insertion or Continuance of Catheter Acute urinary retention 12/31/2015  8:00 PM  Site Assessment Clean;Intact 12/31/2015  8:00 PM  Catheter Maintenance Bag below level of bladder;Catheter secured;Drainage bag/tubing not touching floor;Insertion date on drainage bag;No dependent loops;Seal intact 12/31/2015  8:00 PM  Collection Container Standard drainage bag 12/31/2015  8:00 PM  Securement Method Securing device (Describe) 12/31/2015  8:00 AM  Urinary Catheter Interventions Unclamped 12/30/2015  8:00 PM  Output (mL) 125 mL 01/01/2016  5:00 AM     External Urinary Catheter (Active)  Collection Container Standard drainage bag 12/29/2015  8:00 AM  Output (mL) 600 mL 12/29/2015  8:00 AM    Microbiology/Sepsis markers: Results for orders placed or performed during the hospital encounter of 12/28/15  MRSA PCR Screening     Status: None   Collection Time: 12/29/15  5:06 AM  Result Value Ref Range Status   MRSA by PCR NEGATIVE NEGATIVE Final  Comment:        The GeneXpert MRSA Assay (FDA approved for NASAL specimens only), is one component of a comprehensive MRSA colonization surveillance program. It is not intended to diagnose MRSA infection nor to guide or monitor treatment for MRSA infections.     Anti-infectives:  Anti-infectives    None      Best Practice/Protocols:  VTE Prophylaxis: Lovenox (prophylaxtic dose) Continous  Sedation  Consults: Treatment Team:  Md Pccm, MD    Studies:CXR - 1. Lines and tubes including left chest tube in stable position. No pneumothorax.  2. Cardiomegaly with pulmonary vascular prominence and bilateral interstitial prominence suggesting congestive heart failure. Bibasilar atelectasis .  3. Multiple left rib fractures again noted. Left chest wall subcutaneous emphysema again noted.   Subjective:    Overnight Issues:   Objective:  Vital signs for last 24 hours: Temp:  [97 F (36.1 C)-99.8 F (37.7 C)] 98.7 F (37.1 C) (08/14 0000) Pulse Rate:  [29-117] 91 (08/14 0713) Resp:  [15-24] 20 (08/14 0713) BP: (69-142)/(49-111) 127/53 (08/14 0713) SpO2:  [99 %-100 %] 100 % (08/14 0713) Arterial Line BP: (77-152)/(32-67) 146/57 (08/14 0645) FiO2 (%):  [50 %-90 %] 50 % (08/14 0713) Weight:  [81.4 kg (179 lb 7.3 oz)] 81.4 kg (179 lb 7.3 oz) (08/13 1600)  Hemodynamic parameters for last 24 hours: CVP:  [1 mmHg-22 mmHg] 12 mmHg  Intake/Output from previous day: 08/13 0701 - 08/14 0700 In: 3001.9 [I.V.:1986.6; NG/GT:785.3] Out: 1985 [Urine:1825; Emesis/NG output:160]  Intake/Output this shift: No intake/output data recorded.  Vent settings for last 24 hours: Vent Mode: PSV;CPAP FiO2 (%):  [50 %-90 %] 50 % Set Rate:  [18 bmp] 18 bmp Vt Set:  [540 mL] 540 mL PEEP:  [5 cmH20-8 cmH20] 8 cmH20 Pressure Support:  [5 cmH20] 5 cmH20 Plateau Pressure:  [20 [id number] cmH20] 21 cmH20  Physical Exam:  General: on vent Neuro: sedated but arouses and F/C HEENT/Neck: ETT Resp: rhonchi and wheeze B CVS: reg with frequent PVC GI: soft, NT, mild dist, +BS Extremities: calves soft  Results for orders placed or performed during the hospital encounter of 12/28/15 (from the past 24 hour(s))  Basic metabolic panel     Status: Abnormal   Collection Time: 12/31/15  8:45 AM  Result Value Ref Range   Sodium 114 (LL) 135 - 145 mmol/L   Potassium 4.6 3.5 - 5.1 mmol/L   Chloride  86 (L) 101 - 111 mmol/L   CO2 23 22 - 32 mmol/L   Glucose, Bld 104 (H) 65 - 99 mg/dL   BUN 18 6 - 20 mg/dL   Creatinine, Ser 1.22 0.61 - 1.24 mg/dL   Calcium 7.9 (L) 8.9 - 10.3 mg/dL   GFR calc non Af Amer 55 (L) >60 mL/min   GFR calc Af Amer >60 >60 mL/min   Anion gap 5 5 - 15  Lactic acid, plasma     Status: None   Collection Time: 12/31/15  8:56 AM  Result Value Ref Range   Lactic Acid, Venous 1.5 0.5 - 1.9 mmol/L  Blood gas, arterial     Status: None   Collection Time: 12/31/15 10:45 AM  Result Value Ref Range   FIO2 90.00    Mode PRESSURE REGULATED VOLUME CONTROL    VT 540.0 mL   LHR 24.0 resp/min   Peep/cpap 8.0 cm H20   pH, Arterial 7.392 7.350 - 7.450   pCO2 arterial 37.4 35.0 - 45.0 mmHg   pO2, Arterial 82.9 80.0 -  100.0 mmHg   Bicarbonate 22.3 20.0 - 24.0 mEq/L   TCO2 23.4 0 - 100 mmol/L   Acid-base deficit 1.9 0.0 - 2.0 mmol/L   O2 Saturation 95.4 %   Patient temperature 98.6    Collection site A-LINE    Drawn by [id number]    Sample type ARTERIAL DRAW   Magnesium     Status: Abnormal   Collection Time: 12/31/15 12:00 PM  Result Value Ref Range   Magnesium 1.5 (L) 1.7 - 2.4 mg/dL  Phosphorus     Status: Abnormal   Collection Time: 12/31/15 12:00 PM  Result Value Ref Range   Phosphorus 2.1 (L) 2.5 - 4.6 mg/dL  Basic metabolic panel     Status: Abnormal   Collection Time: 12/31/15 12:01 PM  Result Value Ref Range   Sodium 116 (LL) 135 - 145 mmol/L   Potassium 4.5 3.5 - 5.1 mmol/L   Chloride 88 (L) 101 - 111 mmol/L   CO2 21 (L) 22 - 32 mmol/L   Glucose, Bld 103 (H) 65 - 99 mg/dL   BUN 17 6 - 20 mg/dL   Creatinine, Ser 1.10 0.61 - 1.24 mg/dL   Calcium 7.9 (L) 8.9 - 10.3 mg/dL   GFR calc non Af Amer >60 >60 mL/min   GFR calc Af Amer >60 >60 mL/min   Anion gap 7 5 - 15  Carboxyhemoglobin     Status: None   Collection Time: 12/31/15 12:05 PM  Result Value Ref Range   Total hemoglobin 15.2 13.5 - 18.0 g/dL   O2 Saturation 74.0 %   Carboxyhemoglobin 1.2 0.5 -  1.5 %   Methemoglobin 0.9 0.0 - 1.5 %  Basic metabolic panel     Status: Abnormal   Collection Time: 12/31/15  2:27 PM  Result Value Ref Range   Sodium 116 (LL) 135 - 145 mmol/L   Potassium 4.6 3.5 - 5.1 mmol/L   Chloride 88 (L) 101 - 111 mmol/L   CO2 24 22 - 32 mmol/L   Glucose, Bld 105 (H) 65 - 99 mg/dL   BUN 17 6 - 20 mg/dL   Creatinine, Ser 1.11 0.61 - 1.24 mg/dL   Calcium 7.8 (L) 8.9 - 10.3 mg/dL   GFR calc non Af Amer >60 >60 mL/min   GFR calc Af Amer >60 >60 mL/min   Anion gap 4 (L) 5 - 15  Magnesium     Status: Abnormal   Collection Time: 12/31/15  7:40 PM  Result Value Ref Range   Magnesium 1.6 (L) 1.7 - 2.4 mg/dL  Phosphorus     Status: Abnormal   Collection Time: 12/31/15  7:40 PM  Result Value Ref Range   Phosphorus 2.4 (L) 2.5 - 4.6 mg/dL  Basic metabolic panel     Status: Abnormal   Collection Time: 12/31/15  7:40 PM  Result Value Ref Range   Sodium 116 (LL) 135 - 145 mmol/L   Potassium 4.7 3.5 - 5.1 mmol/L   Chloride 88 (L) 101 - 111 mmol/L   CO2 21 (L) 22 - 32 mmol/L   Glucose, Bld 109 (H) 65 - 99 mg/dL   BUN 17 6 - 20 mg/dL   Creatinine, Ser 0.95 0.61 - 1.24 mg/dL   Calcium 8.1 (L) 8.9 - 10.3 mg/dL   GFR calc non Af Amer >60 >60 mL/min   GFR calc Af Amer >60 >60 mL/min   Anion gap 7 5 - 15  Glucose, capillary     Status: Abnormal  Collection Time: 12/31/15 11:11 PM  Result Value Ref Range   Glucose-Capillary 107 (H) 65 - 99 mg/dL  Basic metabolic panel     Status: Abnormal   Collection Time: 12/31/15 11:45 PM  Result Value Ref Range   Sodium 115 (LL) 135 - 145 mmol/L   Potassium 4.5 3.5 - 5.1 mmol/L   Chloride 88 (L) 101 - 111 mmol/L   CO2 21 (L) 22 - 32 mmol/L   Glucose, Bld 112 (H) 65 - 99 mg/dL   BUN 17 6 - 20 mg/dL   Creatinine, Ser 0.96 0.61 - 1.24 mg/dL   Calcium 7.8 (L) 8.9 - 10.3 mg/dL   GFR calc non Af Amer >60 >60 mL/min   GFR calc Af Amer >60 >60 mL/min   Anion gap 6 5 - 15  Basic metabolic panel     Status: Abnormal   Collection  Time: 01/01/16  3:40 AM  Result Value Ref Range   Sodium 120 (L) 135 - 145 mmol/L   Potassium 4.6 3.5 - 5.1 mmol/L   Chloride 90 (L) 101 - 111 mmol/L   CO2 23 22 - 32 mmol/L   Glucose, Bld 117 (H) 65 - 99 mg/dL   BUN 15 6 - 20 mg/dL   Creatinine, Ser 0.85 0.61 - 1.24 mg/dL   Calcium 8.2 (L) 8.9 - 10.3 mg/dL   GFR calc non Af Amer >60 >60 mL/min   GFR calc Af Amer >60 >60 mL/min   Anion gap 7 5 - 15  CBC     Status: Abnormal   Collection Time: 01/01/16  3:40 AM  Result Value Ref Range   WBC 9.5 4.0 - 10.5 K/uL   RBC 3.94 (L) 4.22 - 5.81 MIL/uL   Hemoglobin 13.3 13.0 - 17.0 g/dL   HCT 37.4 (L) 39.0 - 52.0 %   MCV 94.9 78.0 - 100.0 fL   MCH 33.8 26.0 - 34.0 pg   MCHC 35.6 30.0 - 36.0 g/dL   RDW 13.3 11.5 - 15.5 %   Platelets 151 150 - 400 K/uL  Magnesium     Status: None   Collection Time: 01/01/16  3:40 AM  Result Value Ref Range   Magnesium 1.7 1.7 - 2.4 mg/dL  Phosphorus     Status: Abnormal   Collection Time: 01/01/16  3:40 AM  Result Value Ref Range   Phosphorus 2.1 (L) 2.5 - 4.6 mg/dL  Glucose, capillary     Status: Abnormal   Collection Time: 01/01/16  3:42 AM  Result Value Ref Range   Glucose-Capillary 135 (H) 65 - 99 mg/dL  Blood gas, arterial     Status: None   Collection Time: 01/01/16  3:45 AM  Result Value Ref Range   FIO2 70.00    Delivery systems VENTILATOR    Mode PRESSURE REGULATED VOLUME CONTROL    VT 540 mL   LHR 18 resp/min   Peep/cpap 8.0 cm H20   pH, Arterial 7.389 7.350 - 7.450   pCO2 arterial 38.8 35.0 - 45.0 mmHg   pO2, Arterial 89.2 80.0 - 100.0 mmHg   Bicarbonate 22.9 20.0 - 24.0 mEq/L   TCO2 24.1 0 - 100 mmol/L   Acid-base deficit 1.3 0.0 - 2.0 mmol/L   O2 Saturation 96.2 %   Patient temperature 98.6    Collection site A-LINE    Drawn by [id number]    Sample type ARTERIAL DRAW     Assessment & Plan: Present on Admission: . Traumatic fracture of ribs with  pneumothorax . Multiple rib fractures    LOS: 3 days   Additional comments:I  reviewed the patient's new clinical lab test results. and CXR Fall Left sided rib fractures 3 through 12 with small pneumothorax- CT placed 8/12, has small intermittent air leak now so continue CT to suction today L3 and L4 transverse process fracture Left acetabular fracture- I asked Dr. Marcelino Scot to consult.  SIADH - Na up a bit. Fluid restricting with IVF at [facility]  Vent dependent resp failure/COPD - ABG ok this AM. Full support today. BDs. CHF - on dopamine. Give lasix. EF 15%.  I consulted cardiology to assist. FEN - on TF ETOH abuse- CIWA Critical Care Total Time*: 1 Minutes  Georganna Skeans, MD, MPH, FACS Trauma: [phone] General Surgery: [phone]  01/01/2016  *Care during the described time interval was provided by me. I have reviewed this patient's available data, including medical history, events of note, physical examination and test results as part of my evaluation.

## 2016-01-01 NOTE — Progress Notes (Signed)
Initial Nutrition Assessment  INTERVENTION:   D/C Vital High Protein D/C Prostat  Vital AF 1.2 @ 65 ml/hr Provides: 1560 ml, 1872 kcal (98% of needs), 117 grams protein, and 1265 ml H2O.    NUTRITION DIAGNOSIS:   Inadequate oral intake related to inability to eat as evidenced by NPO status.  GOAL:   Patient will meet greater than or equal to 90% of their needs  MONITOR:   TF tolerance, Skin, I & O's, Labs  REASON FOR ASSESSMENT:   Consult Enteral/tube feeding initiation and management  ASSESSMENT:   Pt with hx of COPD and ETOH who was admitted s/p fall with left sided rib fxs 3-12 with small pneumothorax (CT placed 8/12), L3 and L4 transverse process fx, left acetabular fx, and SIADH.    Hx ETOH, at least 6 beers per day  Patient is currently intubated on ventilator support MV: 10.7 L/min Temp (24hrs), Avg:98.4 F (36.9 C), Min:97 F (36.1 C), Max:99.2 F (37.3 C)  Medications reviewed and include: folvite, lasix, MVI, thiamine, KCl in IVF Labs reviewed: Na 120, PO4 2.1 CBG's: 119-135 Nutrition-Focused physical exam completed. Findings are no fat depletion, mild muscle depletion, and no edema.  OG tube tip coiled in stomach, TF started 8/13 and is infusing Vital High Protein @ 40 ml/hr    Diet Order:  Diet NPO time specified  Skin:  (P) Reviewed, no issues  Last BM:  unknown  Height:   Ht Readings from Last 1 Encounters:  12/29/15 5\' 10"  (1.778 m)    Weight:   Wt Readings from Last 1 Encounters:  12/31/15 179 lb 7.3 oz (81.4 kg)    Ideal Body Weight:  75.4 kg  BMI:  Body mass index is 25.75 kg/m.  Estimated Nutritional Needs:   Kcal:  1904  Protein:  110-125 grams  Fluid:  > 1.5 L/day  EDUCATION NEEDS:   No education needs identified at this time  [place], [place], [place] Pager [phone] After Hours Pager

## 2016-01-02 ENCOUNTER — Inpatient Hospital Stay (HOSPITAL_COMMUNITY): Payer: Medicare Other

## 2016-01-02 LAB — BLOOD GAS, ARTERIAL
ACID-BASE DEFICIT: 0.3 mmol/L (ref 0.0–2.0)
BICARBONATE: 23.9 meq/L (ref 20.0–24.0)
Drawn by: 44166
FIO2: 40
LHR: 18 {breaths}/min
O2 Saturation: 96.9 %
PATIENT TEMPERATURE: 98.5
PCO2 ART: 39.7 mmHg (ref 35.0–45.0)
PEEP: 8 cmH2O
PH ART: 7.396 (ref 7.350–7.450)
TCO2: 25.1 mmol/L (ref 0–100)
VT: 540 mL
pO2, Arterial: 91.9 mmHg (ref 80.0–100.0)

## 2016-01-02 LAB — CBC
HEMATOCRIT: 34 % — AB (ref 39.0–52.0)
HEMOGLOBIN: 11.8 g/dL — AB (ref 13.0–17.0)
MCH: 33.4 pg (ref 26.0–34.0)
MCHC: 34.7 g/dL (ref 30.0–36.0)
MCV: 96.3 fL (ref 78.0–100.0)
Platelets: 159 10*3/uL (ref 150–400)
RBC: 3.53 MIL/uL — ABNORMAL LOW (ref 4.22–5.81)
RDW: 13.7 % (ref 11.5–15.5)
WBC: 7.8 10*3/uL (ref 4.0–10.5)

## 2016-01-02 LAB — BASIC METABOLIC PANEL
Anion gap: 7 (ref 5–15)
Anion gap: 8 (ref 5–15)
BUN: 27 mg/dL — ABNORMAL HIGH (ref 6–20)
BUN: 29 mg/dL — AB (ref 6–20)
CHLORIDE: 93 mmol/L — AB (ref 101–111)
CHLORIDE: 95 mmol/L — AB (ref 101–111)
CO2: 24 mmol/L (ref 22–32)
CO2: 25 mmol/L (ref 22–32)
CREATININE: 0.77 mg/dL (ref 0.61–1.24)
CREATININE: 0.71 mg/dL (ref 0.61–1.24)
Calcium: 8.1 mg/dL — ABNORMAL LOW (ref 8.9–10.3)
Calcium: 8.1 mg/dL — ABNORMAL LOW (ref 8.9–10.3)
GFR calc non Af Amer: >60 mL/min (ref >60)
GFR calc non Af Amer: >60 mL/min (ref >60)
GLUCOSE: 175 mg/dL — AB (ref 65–99)
Glucose, Bld: 138 mg/dL — ABNORMAL HIGH (ref 65–99)
Potassium: 4.3 mmol/L (ref 3.5–5.1)
Potassium: 3.9 mmol/L (ref 3.5–5.1)
Sodium: 124 mmol/L — ABNORMAL LOW (ref 135–145)
Sodium: 128 mmol/L — ABNORMAL LOW (ref 135–145)

## 2016-01-02 LAB — GLUCOSE, CAPILLARY
GLUCOSE-CAPILLARY: 121 mg/dL — AB (ref 65–99)
GLUCOSE-CAPILLARY: 131 mg/dL — AB (ref 65–99)
GLUCOSE-CAPILLARY: 142 mg/dL — AB (ref 65–99)
Glucose-Capillary: 148 mg/dL — ABNORMAL HIGH (ref 65–99)
Glucose-Capillary: 143 mg/dL — ABNORMAL HIGH (ref 65–99)
Glucose-Capillary: 157 mg/dL — ABNORMAL HIGH (ref 65–99)

## 2016-01-02 LAB — MAGNESIUM: Magnesium: 1.9 mg/dL (ref 1.7–2.4)

## 2016-01-02 MED ORDER — METOPROLOL SUCCINATE ER 25 MG PO TB24
25.0000 mg | ORAL_TABLET | Freq: Two times a day (BID) | ORAL | Status: DC
Start: 2016-01-02 — End: 2016-01-02

## 2016-01-02 MED ORDER — METOPROLOL TARTRATE 25 MG PO TABS
12.5000 mg | ORAL_TABLET | Freq: Four times a day (QID) | ORAL | Status: DC
  Administered 2016-01-02: 12.5 mg via ORAL
  Filled 2016-01-02: qty 1
  Filled 2016-01-02: qty 0.5

## 2016-01-02 MED ORDER — FUROSEMIDE 10 MG/ML IJ SOLN
40.0000 mg | Freq: Once | INTRAMUSCULAR | Status: AC
  Administered 2016-01-02: 40 mg via INTRAVENOUS
  Filled 2016-01-02: qty 4

## 2016-01-02 MED ORDER — METOPROLOL SUCCINATE ER 25 MG PO TB24: 12.5000 mg | ORAL_TABLET | Freq: Four times a day (QID) | ORAL | Status: DC

## 2016-01-02 MED ORDER — METOPROLOL TARTRATE 25 MG/10 ML ORAL SUSPENSION
12.5000 mg | Freq: Four times a day (QID) | ORAL | Status: DC
  Administered 2016-01-02 – 2016-01-05 (×10): 12.5 mg via ORAL
  Filled 2016-01-02 (×10): qty 10

## 2016-01-02 MED ORDER — ALBUMIN HUMAN 5 % IV SOLN
25.0000 g | Freq: Once | INTRAVENOUS | Status: AC
  Administered 2016-01-02: 25 g via INTRAVENOUS
  Filled 2016-01-02: qty 500

## 2016-01-02 NOTE — Progress Notes (Signed)
Plan for family conference tomorrow with trauma MD 01/03/2016 @ 14:30. Granddaughter stated that she, patient's wife and one of the daughters will be attending.

## 2016-01-02 NOTE — Progress Notes (Signed)
Called re tachycardia I have reviewed telemtry  At 14:28 rhythm changed  Appears to be afib with RVR   Pt with signif conduction dz   Will cautiously add low dose metoprolol 12.5 q 6 hours  Watch HR And BP He is not a good anticoag candidate.

## 2016-01-02 NOTE — Progress Notes (Signed)
Follow up - Trauma and Critical Care  Patient Details:    Chad Rogers is an 80 y.o. male.  Lines/tubes : Airway 8 mm (Active)  Secured at (cm) 24 cm 01/02/2016  3:00 AM  Measured From Lips 01/02/2016  7:00 AM  Secured Location Left 01/02/2016  7:00 AM  Secured By [employer] 01/02/2016  7:00 AM  Tube Holder Repositioned Yes 01/02/2016  7:00 AM  Cuff Pressure (cm H2O) 24 cm H2O 01/02/2016  3:00 AM  Site Condition Dry 01/02/2016  7:00 AM     PICC Triple Lumen [id number] PICC Right Basilic 38 cm 0 cm (Active)  Indication for Insertion or Continuance of Line Vasoactive infusions 01/01/2016  8:00 PM  Exposed Catheter (cm) 0 cm 12/30/2015  3:21 PM  Site Assessment Clean;Dry;Intact 01/01/2016  8:00 PM  Lumen #1 Status Infusing 01/01/2016  8:00 PM  Lumen #2 Status Infusing 01/01/2016  8:00 PM  Lumen #3 Status Infusing 01/01/2016  8:00 PM  Dressing Type Transparent;Occlusive 01/01/2016  8:00 PM  Dressing Status Clean;Dry;Intact;Antimicrobial disc in place 01/01/2016  8:00 PM  Line Care Connections checked and tightened;Zeroed and calibrated;Leveled 01/01/2016  8:00 PM  Dressing Change Due 01/06/16 01/01/2016  8:00 PM     Arterial Line 12/31/15 Left Radial (Active)  Site Assessment Clean;Dry;Intact 01/01/2016  8:00 PM  Line Status Pulsatile blood flow;Positional 01/01/2016  8:00 PM  Art Line Waveform Whip 01/01/2016  8:00 PM  Art Line Interventions Leveled;Zeroed and calibrated 01/01/2016  8:00 PM  Color/Movement/Sensation Capillary refill less than 3 sec 01/01/2016  8:00 PM  Dressing Type Transparent 01/01/2016  8:00 PM  Dressing Status Clean;Dry;Intact 01/01/2016  8:00 PM  Dressing Change Due 01/07/16 01/01/2016  8:00 PM     Chest Tube Left (Active)  Suction -20 cm H2O 01/01/2016  8:00 PM  Chest Tube Air Leak Minimal 01/01/2016  8:00 PM  Patency Intervention Tip/tilt 01/01/2016  8:00 PM  Drainage Description Dark red 01/01/2016  4:00 PM  Dressing Status Clean;Dry;Intact 01/01/2016  8:00 PM   Dressing Intervention New dressing 12/30/2015 10:00 PM  Site Assessment Other (Comment) 12/31/2015  4:00 PM  Surrounding Skin Unable to view 01/01/2016  4:00 PM  Output (mL) 0 mL 01/02/2016  5:00 AM     NG/OG Tube Orogastric 16 Fr. Right mouth (Active)  Placement Verification Auscultation 01/01/2016  8:00 PM  Site Assessment Clean;Dry;Intact 01/01/2016  8:00 PM  Status Infusing tube feed 01/01/2016  8:00 PM  Output (mL) 160 mL 01/01/2016  5:00 AM     Urethral Catheter Darla Anastasia Fiedler RN Non-latex 16 Fr. (Active)  Indication for Insertion or Continuance of Catheter Aggressive IV diuresis 01/01/2016  8:00 PM  Site Assessment Clean;Intact 01/01/2016  8:00 PM  Catheter Maintenance Bag below level of bladder;Catheter secured;Insertion date on drainage bag;No dependent loops;Seal intact;Drainage bag/tubing not touching floor 01/01/2016  8:00 PM  Collection Container Standard drainage bag 01/01/2016  8:00 PM  Securement Method Securing device (Describe) 01/01/2016  8:00 PM  Urinary Catheter Interventions Unclamped 01/01/2016  8:00 PM  Output (mL) 35 mL 01/02/2016  6:00 AM    Microbiology/Sepsis markers: Results for orders placed or performed during the hospital encounter of 12/28/15  MRSA PCR Screening     Status: None   Collection Time: 12/29/15  5:06 AM  Result Value Ref Range Status   MRSA by PCR NEGATIVE NEGATIVE Final    Comment:        The GeneXpert MRSA Assay (FDA approved for NASAL specimens only), is one component  of a comprehensive MRSA colonization surveillance program. It is not intended to diagnose MRSA infection nor to guide or monitor treatment for MRSA infections.     Anti-infectives:  Anti-infectives    None      Best Practice/Protocols:  VTE Prophylaxis: Lovenox (prophylaxtic dose) and Mechanical GI Prophylaxis: Proton Pump Inhibitor Continous Sedation  Consults: Treatment Team:  Altamese Chunky, MD [facility], MD    Events:  Subjective:    Overnight  Issues: Patient on fentanyl.  Goals of care need to be determined.  Lots of PVCs  Objective:  Vital signs for last 24 hours: Temp:  [98 F (36.7 C)-99.2 F (37.3 C)] 98.5 F (36.9 C) (08/15 0400) Pulse Rate:  [28-111] 42 (08/15 0700) Resp:  [15-36] 16 (08/15 0700) BP: (84-153)/(43-97) 99/46 (08/15 0700) SpO2:  [95 %-100 %] 100 % (08/15 0717) Arterial Line BP: (106-186)/(35-68) 131/38 (08/15 0700) FiO2 (%):  [40 %-50 %] 40 % (08/15 0717) Weight:  [83.5 kg (184 lb 1.4 oz)] 83.5 kg (184 lb 1.4 oz) (08/15 0500)  Hemodynamic parameters for last 24 hours: CVP:  [7 mmHg-40 mmHg] 15 mmHg  Intake/Output from previous day: 08/14 0701 - 08/15 0700 In: 3100.1 [I.V.:1578.1; NG/GT:1422; IV Piggyback:100] Out: 1715 [Urine:1715]  Intake/Output this shift: No intake/output data recorded.  Vent settings for last 24 hours: Vent Mode: PRVC FiO2 (%):  [40 %-50 %] 40 % Set Rate:  [18 bmp] 18 bmp Vt Set:  [540 mL] 540 mL PEEP:  [8 cmH20] 8 cmH20 Plateau Pressure:  [16 cmH20-23 cmH20] 18 cmH20  Physical Exam:  General: no respiratory distress and sedateddoes not appear to be responding Neuro: nonfocal exam, RASS -1 and RASS -2 Resp: rhonchi bilaterally CVS: IRR and Lots of wide complex beats GI: soft, nontender, BS WNL, no r/g and tolerating tube feedings well. Extremities: No changes  Results for orders placed or performed during the hospital encounter of 12/28/15 (from the past 24 hour(s))  Glucose, capillary     Status: Abnormal   Collection Time: 01/01/16  8:19 AM  Result Value Ref Range   Glucose-Capillary 119 (H) 65 - 99 mg/dL   Comment 1 Notify RN    Comment 2 Document in Chart   Glucose, capillary     Status: Abnormal   Collection Time: 01/01/16 12:27 PM  Result Value Ref Range   Glucose-Capillary 140 (H) 65 - 99 mg/dL   Comment 1 Notify RN    Comment 2 Document in Chart   Glucose, capillary     Status: Abnormal   Collection Time: 01/01/16  4:07 PM  Result Value Ref Range    Glucose-Capillary 119 (H) 65 - 99 mg/dL   Comment 1 Notify RN    Comment 2 Document in Chart   Magnesium     Status: None   Collection Time: 01/01/16  5:12 PM  Result Value Ref Range   Magnesium 1.7 1.7 - 2.4 mg/dL  Phosphorus     Status: Abnormal   Collection Time: 01/01/16  5:12 PM  Result Value Ref Range   Phosphorus 1.7 (L) 2.5 - 4.6 mg/dL  Glucose, capillary     Status: Abnormal   Collection Time: 01/01/16  8:04 PM  Result Value Ref Range   Glucose-Capillary 133 (H) 65 - 99 mg/dL  Glucose, capillary     Status: Abnormal   Collection Time: 01/01/16 11:23 PM  Result Value Ref Range   Glucose-Capillary 134 (H) 65 - 99 mg/dL  Blood gas, arterial     Status:  Abnormal   Collection Time: 01/02/16  3:00 AM  Result Value Ref Range   FIO2 40.00    Delivery systems VENTILATOR    Mode PRESSURE REGULATED VOLUME CONTROL    VT 540 mL   LHR 18 resp/min   Peep/cpap 8.0 cm H20   pH, Arterial 7.396 7.350 - 7.450   pCO2 arterial 39.7 35.0 - 45.0 mmHg   pO2, Arterial 91.9 80.0 - 100.0 mmHg   Bicarbonate 23.9 20.0 - 24.0 mEq/L   TCO2 25.1 0 - 100 mmol/L   Acid-base deficit 0.3 0.0 - 2.0 mmol/L   O2 Saturation 96.9 %   Patient temperature 98.5    Collection site A-LINE    Drawn by [phone]    Sample type ARTERIAL    Allens test (pass/fail) NOT INDICATED (A) PASS  Glucose, capillary     Status: Abnormal   Collection Time: 01/02/16  4:16 AM  Result Value Ref Range   Glucose-Capillary 148 (H) 65 - 99 mg/dL  CBC     Status: Abnormal   Collection Time: 01/02/16  5:29 AM  Result Value Ref Range   WBC 7.8 4.0 - 10.5 K/uL   RBC 3.53 (L) 4.22 - 5.81 MIL/uL   Hemoglobin 11.8 (L) 13.0 - 17.0 g/dL   HCT 34.0 (L) 39.0 - 52.0 %   MCV 96.3 78.0 - 100.0 fL   MCH 33.4 26.0 - 34.0 pg   MCHC 34.7 30.0 - 36.0 g/dL   RDW 13.7 11.5 - 15.5 %   Platelets 159 150 - 400 K/uL  Basic metabolic panel     Status: Abnormal   Collection Time: 01/02/16  5:29 AM  Result Value Ref Range   Sodium 124 (L) 135 - 145  mmol/L   Potassium 4.3 3.5 - 5.1 mmol/L   Chloride 93 (L) 101 - 111 mmol/L   CO2 24 22 - 32 mmol/L   Glucose, Bld 138 (H) 65 - 99 mg/dL   BUN 27 (H) 6 - 20 mg/dL   Creatinine, Ser 0.77 0.61 - 1.24 mg/dL   Calcium 8.1 (L) 8.9 - 10.3 mg/dL   GFR calc non Af Amer >60 >60 mL/min   GFR calc Af Amer >60 >60 mL/min   Anion gap 7 5 - 15     Assessment/Plan:   NEURO  Altered Mental Status:  sedation   Plan: Wean when appropriate to consider extubation  PULM  Chest Wall Trauma rib fractures and Pneumothorax (traumatic)   Plan: Has left chest tube in place and does not have an air leak, but does have subcutaneous air.  Drop PEEP to 5 from 8  CARDIO  Ventricular Premature Beats (although occasionally the patient will have a low BP.)   Plan: Cardiology to follow  RENAL  Hypervolemia and Positive on fluids and may be able to tolerate diuresis   Plan: Lasix and Albumin  GI  No specific issues   Plan: CPM  ID  No specific issues active   Plan: CPM  HEME  Anemia acute blood loss anemia)   Plan: No blood needed.  ENDO No specific issues   Plan: CPM  Global Issues  Patient is on the track for needing trach/PEG for continued best care.  Extubation does not appear to be an option with him failing initially without ETT    LOS: 4 days   Additional comments:I reviewed the patient's new clinical lab test results. cbc/bmet and I reviewed the patients new imaging test results. cxr  Critical Care Total  Time*: 30 Minutes  Joron Zacharia Sowles 01/02/2016  *Care during the described time interval was provided by me and/or other providers on the critical care team.  I have reviewed this patient's available data, including medical history, events of note, physical examination and test results as part of my evaluation.

## 2016-01-02 NOTE — Care Management Important Message (Signed)
Important Message  Patient Details  Name: Chad Rogers MRN: [id number] Date of Birth: [birth year]   Medicare Important Message Given:  Yes    Nathen May 01/02/2016, 10:38 AM

## 2016-01-02 NOTE — Progress Notes (Signed)
Cardiologist returned page regarding change in rhythm. Aware that of Afib with BBB and PVCs, rate 110s-120s, BP via Aline 120s/30s, (60) and BP by cuff 93/61 (72). Plan to start low dose Metoprolol via tube. Will continue to monitor.

## 2016-01-02 NOTE — Progress Notes (Signed)
Patient Name: Chad Rogers Date of Encounter: 01/02/2016  Hospital Problem List     Active Problems:   Cardiomyopathy [facility])   Traumatic fracture of ribs with pneumothorax   Multiple rib fractures   Respiratory failure (HCC)   Acute on chronic systolic CHF (congestive heart failure) (HCC)   NSVT (nonsustained ventricular tachycardia) (HCC)    Subjective   Remains sedated and intubated. Opens eye to verbal stimuli  Inpatient Medications    . antiseptic oral rinse  7 mL Mouth Rinse 10 times per day  . atropine  0.4 mg Intravenous Once  . chlorhexidine gluconate (SAGE KIT)  15 mL Mouth Rinse BID  . enoxaparin (LOVENOX) injection  40 mg Subcutaneous Q24H  . famotidine (PEPCID) IV  20 mg Intravenous Q24H  . folic acid  1 mg Oral Daily  . ipratropium-albuterol  3 mL Nebulization Q4H  . multivitamin with minerals  1 tablet Oral Daily  . sertraline  50 mg Oral Daily  . sodium chloride flush  10-40 mL Intracatheter Q12H  . thiamine  100 mg Oral Daily   Or  . thiamine  100 mg Intravenous Daily    Vital Signs    Vitals:   01/02/16 1000 01/02/16 1100 01/02/16 1125 01/02/16 1134  BP: (!) 118/58 110/77 (!) 152/45 (!) 151/51  Pulse: 94 95  99  Resp: 11 10  (!) 9  Temp:      TempSrc:      SpO2: 100% 100%  100%  Weight:      Height:        Intake/Output Summary (Last 24 hours) at 01/02/16 1148 Last data filed at 01/02/16 1100  Gross per 24 hour  Intake           3315.3 ml  Output             1790 ml  Net           1525.3 ml   Filed Weights   12/28/15 2254 12/31/15 1600 01/02/16 0500  Weight: 165 lb (74.8 kg) 179 lb 7.3 oz (81.4 kg) 184 lb 1.4 oz (83.5 kg)    Physical Exam    General: Intubated and sedated on vent, NAD. Neuro: Alert and oriented X 3. Moves all extremities spontaneously. Psych: Blunt. HEENT:  Normal  Neck: Supple without bruits or JVD. Lungs:  Resp regular and unlabored on the vent, Rhonchi and course crackles (improved from yesterday). Heart:  RRR no s3, s4, or murmurs. Abdomen: Soft, non-tender, non-distended, BS + x 4.  Extremities: No clubbing, cyanosis,  Diffuse upper extremity edema. DP/PT/Radials 2+ and equal bilaterally.  Labs    CBC  Recent Labs  01/01/16 0340 01/02/16 0529  WBC 9.5 7.8  HGB 13.3 11.8*  HCT 37.4* 34.0*  MCV 94.9 96.3  PLT 151 448   Basic Metabolic Panel  Recent Labs  01/01/16 0340 01/01/16 1712 01/02/16 0529  NA 120*  --  124*  K 4.6  --  4.3  CL 90*  --  93*  CO2 23  --  24  GLUCOSE 117*  --  138*  BUN 15  --  27*  CREATININE 0.85  --  0.77  CALCIUM 8.2*  --  8.1*  MG 1.7 1.7  --   PHOS 2.1* 1.7*  --    Liver Function Tests  Recent Labs  12/30/15 2150  AST 49*  ALT 31  ALKPHOS 54  BILITOT 1.8*  PROT 5.6*  ALBUMIN 3.3*   No  results for input(s): LIPASE, AMYLASE in the last 72 hours. Cardiac Enzymes No results for input(s): CKTOTAL, CKMB, CKMBINDEX, TROPONINI in the last 72 hours. BNP Invalid input(s): POCBNP D-Dimer No results for input(s): DDIMER in the last 72 hours. Hemoglobin A1C No results for input(s): HGBA1C in the last 72 hours. Fasting Lipid Panel  Recent Labs  12/30/15 2203  TRIG 38   Thyroid Function Tests No results for input(s): TSH, T4TOTAL, T3FREE, THYROIDAB in the last 72 hours.  Invalid input(s): FREET3  Telemetry    SR with PACs/PVCs and short runs of NSVT  ECG    No recent  Radiology    Dg Chest Port 1 View  Result Date: 01/02/2016 CLINICAL DATA:  Chest tube. EXAM: PORTABLE CHEST 1 VIEW COMPARISON:  01/01/2016. FINDINGS: Endotracheal tube, NG tube, right PICC line stable position. Left chest tube in stable position. Cardiomegaly with diffuse bilateral from interstitial prominence again noted . No pneumothorax. Multiple displaced left rib fractures again noted. Progressive left chest wall subcutaneous emphysema . IMPRESSION: 1. Lines and tubes including left chest tube in stable position. No pneumothorax. 2. Cardiomegaly with diffuse  bilateral mild interstitial prominence consistent with mild congestive heart failure. No interim change. 3. Multiple displaced left rib fractures again noted. Progressive left chest wall subcutaneous emphysema . Electronically Signed   By: Marcello Moores  Register   On: 01/02/2016 07:21     Assessment & Plan    80 yo male with PMH of ETOH/Tobacco abuse/NICM EF 15-20% 2014/COPD/HTN/OA and Pulmonary Fibrosis who presented to [facility] after a fall at home. Found to have multiple left-sided rib fractures, L3-4 transverse process fracture, left acetabular fracture and left pneumothorax. He was transferred to [facility] under trauma services.  Marland Kitchen NICM/ Combined CHF: His office visits have been sporadic over the past couple of years, with last visit being in 2013 with Dr. Lattie Haw. He does see Dr. Stanford Breed in the past to noted his EF at 25% and recommended consideration of ICD placement which the patient denied that time. He was followed up in 2013 with Dr. Lattie Haw who did a repeat 2-D echocardiogram which showed a further decrease EF of 15-20%. At that time he was continued on carvedilol 6.25 mg twice a day with plans to follow-up, which the patient did not appear to do.  -- Received 4m IV lasix yesterday with 1.7L UOP, remains net +9.9L. Remained volume overloaded on physical exam.  -- Has been weaned off dopamine drip, and blood pressure has remained stable. -- Received 472mIV lasix this morning with 856cc UOP thus far. Will continue to monitor output, along with Na level.  -- 2D echo yesterday showed a slightly improved EF of 25-30% since previous. Continues to have extensive ectopy on telemetry.   2. Rib fractures, left-sided acetabular fracture on the L3-L4 transverse process fracture and left-sided pneumothorax: Per trauma  3. Respiratory failure: PCCM following  Signed, LiReino BellisP-C Pager [phone] seen and examined  Agree with findings as noted above by L  RoMancel BaleT remains intubated   Lungs rel clear anteriorly  Cardiac  RRR with frequent skips  No S3  Ext with diffuse edema  I would continue to recomm diuresis with IV lasix  Hold further albumin  Follow Na and K   Continue telemetry    PaDorris Carnes

## 2016-01-03 ENCOUNTER — Inpatient Hospital Stay (HOSPITAL_COMMUNITY): Payer: Medicare Other

## 2016-01-03 LAB — BASIC METABOLIC PANEL
ANION GAP: 8 (ref 5–15)
BUN: 28 mg/dL — ABNORMAL HIGH (ref 6–20)
CHLORIDE: 96 mmol/L — AB (ref 101–111)
CO2: 26 mmol/L (ref 22–32)
Calcium: 8.4 mg/dL — ABNORMAL LOW (ref 8.9–10.3)
Creatinine, Ser: 0.7 mg/dL (ref 0.61–1.24)
GFR calc Af Amer: >60 mL/min (ref >60)
GLUCOSE: 154 mg/dL — AB (ref 65–99)
POTASSIUM: 4.1 mmol/L (ref 3.5–5.1)
Sodium: 130 mmol/L — ABNORMAL LOW (ref 135–145)

## 2016-01-03 LAB — GLUCOSE, CAPILLARY
GLUCOSE-CAPILLARY: 170 mg/dL — AB (ref 65–99)
GLUCOSE-CAPILLARY: 178 mg/dL — AB (ref 65–99)
GLUCOSE-CAPILLARY: 188 mg/dL — AB (ref 65–99)
Glucose-Capillary: 139 mg/dL — ABNORMAL HIGH (ref 65–99)
Glucose-Capillary: 164 mg/dL — ABNORMAL HIGH (ref 65–99)
Glucose-Capillary: 203 mg/dL — ABNORMAL HIGH (ref 65–99)

## 2016-01-03 LAB — CBC WITH DIFFERENTIAL/PLATELET
BASOS ABS: 0 10*3/uL (ref 0.0–0.1)
Basophils Relative: 0 %
Eosinophils Absolute: 0 10*3/uL (ref 0.0–0.7)
Eosinophils Relative: 1 %
HEMATOCRIT: 33.4 % — AB (ref 39.0–52.0)
HEMOGLOBIN: 11.1 g/dL — AB (ref 13.0–17.0)
LYMPHS ABS: 1.7 10*3/uL (ref 0.7–4.0)
LYMPHS PCT: 21 %
MCH: 32.6 pg (ref 26.0–34.0)
MCHC: 33.2 g/dL (ref 30.0–36.0)
MCV: 98.2 fL (ref 78.0–100.0)
Monocytes Absolute: 1 10*3/uL (ref 0.1–1.0)
Monocytes Relative: 13 %
NEUTROS ABS: 5.1 10*3/uL (ref 1.7–7.7)
Neutrophils Relative %: 65 %
Platelets: 158 10*3/uL (ref 150–400)
RBC: 3.4 MIL/uL — AB (ref 4.22–5.81)
RDW: 13.8 % (ref 11.5–15.5)
WBC: 7.9 10*3/uL (ref 4.0–10.5)

## 2016-01-03 MED ORDER — AMIODARONE HCL IN DEXTROSE 360-4.14 MG/200ML-% IV SOLN
30.0000 mg/h | INTRAVENOUS | Status: DC
  Administered 2016-01-03 – 2016-01-07 (×18): 60 mg/h via INTRAVENOUS
  Administered 2016-01-08: 30 mg/h via INTRAVENOUS
  Administered 2016-01-08: 60 mg/h via INTRAVENOUS
  Administered 2016-01-08: 30 mg/h via INTRAVENOUS
  Administered 2016-01-09 – 2016-01-11 (×4): 60 mg/h via INTRAVENOUS
  Administered 2016-01-11 – 2016-01-12 (×2): 30 mg/h via INTRAVENOUS
  Filled 2016-01-03 (×7): qty 200
  Filled 2016-01-03: qty 400
  Filled 2016-01-03 (×20): qty 200

## 2016-01-03 MED ORDER — FUROSEMIDE 10 MG/ML IJ SOLN
80.0000 mg | Freq: Once | INTRAMUSCULAR | Status: AC
  Administered 2016-01-03: 80 mg via INTRAVENOUS
  Filled 2016-01-03: qty 8

## 2016-01-03 MED ORDER — ARTIFICIAL TEARS OP OINT
TOPICAL_OINTMENT | OPHTHALMIC | Status: DC | PRN
  Administered 2016-01-03 – 2016-01-04 (×2): 1 via OPHTHALMIC
  Filled 2016-01-03: qty 3.5

## 2016-01-03 NOTE — Progress Notes (Signed)
Subjective: INtubated  Sedated   Objective: Vitals:   01/03/16 0800 01/03/16 0900 01/03/16 0903 01/03/16 1000  BP: 112/90 123/79 123/79 (!) 110/54  Pulse: (!) 127 61 (!) 125 (!) 113  Resp: 19 (!) 27  16  Temp: 98.5 F (36.9 C)     TempSrc: Oral     SpO2: 98% 98%  98%  Weight:      Height:       Weight change: 2 lb 6.8 oz (1.1 kg)  Intake/Output Summary (Last 24 hours) at 01/03/16 1110 Last data filed at 01/03/16 1000  Gross per 24 hour  Intake           2980.5 ml  Output             1764 ml  Net           1216.5 ml    General:Intubated and sedated   Neck:  Diffiuclt to assess JVP  l Heart: Irregular rate and rhythm, without murmurs, rubs, gallops.  Lungs: Rhonchi   Exemities:  1+ edema.   Neuro: Intubated    Tele:  Afib and PVCs  Rates 100s  Lab Results: Results for orders placed or performed during the hospital encounter of 12/28/15 (from the past 24 hour(s))  Glucose, capillary     Status: Abnormal   Collection Time: 01/02/16 12:26 PM  Result Value Ref Range   Glucose-Capillary 142 (H) 65 - 99 mg/dL   Comment 1 Notify RN    Comment 2 Document in Chart   Glucose, capillary     Status: Abnormal   Collection Time: 01/02/16  4:11 PM  Result Value Ref Range   Glucose-Capillary 131 (H) 65 - 99 mg/dL   Comment 1 Notify RN    Comment 2 Document in Chart   Basic metabolic panel     Status: Abnormal   Collection Time: 01/02/16  4:39 PM  Result Value Ref Range   Sodium 128 (L) 135 - 145 mmol/L   Potassium 3.9 3.5 - 5.1 mmol/L   Chloride 95 (L) 101 - 111 mmol/L   CO2 25 22 - 32 mmol/L   Glucose, Bld 175 (H) 65 - 99 mg/dL   BUN 29 (H) 6 - 20 mg/dL   Creatinine, Ser 0.71 0.61 - 1.24 mg/dL   Calcium 8.1 (L) 8.9 - 10.3 mg/dL   GFR calc non Af Amer >60 >60 mL/min   GFR calc Af Amer >60 >60 mL/min   Anion gap 8 5 - 15  Magnesium     Status: None   Collection Time: 01/02/16  4:39 PM  Result Value Ref Range   Magnesium 1.9 1.7 - 2.4 mg/dL  Glucose, capillary      Status: Abnormal   Collection Time: 01/02/16  8:08 PM  Result Value Ref Range   Glucose-Capillary 157 (H) 65 - 99 mg/dL  Glucose, capillary     Status: Abnormal   Collection Time: 01/02/16 11:43 PM  Result Value Ref Range   Glucose-Capillary 121 (H) 65 - 99 mg/dL  Glucose, capillary     Status: Abnormal   Collection Time: 01/03/16  3:55 AM  Result Value Ref Range   Glucose-Capillary 164 (H) 65 - 99 mg/dL  CBC with Differential/Platelet     Status: Abnormal   Collection Time: 01/03/16  4:10 AM  Result Value Ref Range   WBC 7.9 4.0 - 10.5 K/uL   RBC 3.40 (L) 4.22 - 5.81 MIL/uL   Hemoglobin 11.1 (L) 13.0 - 17.0  g/dL   HCT 33.4 (L) 39.0 - 52.0 %   MCV 98.2 78.0 - 100.0 fL   MCH 32.6 26.0 - 34.0 pg   MCHC 33.2 30.0 - 36.0 g/dL   RDW 13.8 11.5 - 15.5 %   Platelets 158 150 - 400 K/uL   Neutrophils Relative % 65 %   Neutro Abs 5.1 1.7 - 7.7 K/uL   Lymphocytes Relative 21 %   Lymphs Abs 1.7 0.7 - 4.0 K/uL   Monocytes Relative 13 %   Monocytes Absolute 1.0 0.1 - 1.0 K/uL   Eosinophils Relative 1 %   Eosinophils Absolute 0.0 0.0 - 0.7 K/uL   Basophils Relative 0 %   Basophils Absolute 0.0 0.0 - 0.1 K/uL  Basic metabolic panel     Status: Abnormal   Collection Time: 01/03/16  4:10 AM  Result Value Ref Range   Sodium 130 (L) 135 - 145 mmol/L   Potassium 4.1 3.5 - 5.1 mmol/L   Chloride 96 (L) 101 - 111 mmol/L   CO2 26 22 - 32 mmol/L   Glucose, Bld 154 (H) 65 - 99 mg/dL   BUN 28 (H) 6 - 20 mg/dL   Creatinine, Ser 0.70 0.61 - 1.24 mg/dL   Calcium 8.4 (L) 8.9 - 10.3 mg/dL   GFR calc non Af Amer >60 >60 mL/min   GFR calc Af Amer >60 >60 mL/min   Anion gap 8 5 - 15  Glucose, capillary     Status: Abnormal   Collection Time: 01/03/16  8:56 AM  Result Value Ref Range   Glucose-Capillary 170 (H) 65 - 99 mg/dL    Studies/Results: Dg Chest Port 1 View  Result Date: 01/03/2016 CLINICAL DATA:  Cardiomyopathy, traumatic rib fractures and pneumothorax on the left, acute on chronic CHF,  COPD, current smoker. EXAM: PORTABLE CHEST 1 VIEW COMPARISON:  Portable chest of January 02, 2016 FINDINGS: The lungs are adequately inflated. Interstitial and alveolar opacity in the right mid lung persists. The interstitial markings elsewhere are mildly increased. The left chest tube is in stable position. A faint pleural line is visible in the left pulmonary apex there is no significant pleural effusion. There is left basilar atelectasis. The cardiac silhouette is enlarged though stable. The central pulmonary vascularity is prominent. The endotracheal tube lies 4 cm above the carina. The esophagogastric tube extends below the GE junction with the tip coiling in the gastric cardia. The right sided PICC line tip projects over the midportion of the SVC. IMPRESSION: Small left apical pneumothorax. Stable left chest tube. Slight interval increased prominence of the pulmonary interstitial markings consistent with interstitial edema. Stable right perihilar density consistent with pneumonia or atelectasis. The support tubes are in reasonable position. Electronically Signed   By: David  Martinique M.D.   On: 01/03/2016 07:45    Medications: Reviewed   @PROBHOSP @  1  Rhythm  Wide complex and irreg makes difficult  Appears to be afib with frequent PVCs   I would add amio IV 60 mg /hour and follow  COntinue low dose b blocker  Not a candidate for anticoag    2  NICM  Volume remains increased  I would give IV lasix and follow UO, BP and Cr.    LOS: 5 days   Dorris Carnes 01/03/2016, 11:10 AM

## 2016-01-03 NOTE — Progress Notes (Signed)
Pt was bagged lavage on 100%FiO2. Pt was suctioned and got back copious amount of tan yellow mucous plugs/secretions. No apparent complications noted. Pt place back on the vent, Pt is stable at this time

## 2016-01-03 NOTE — Progress Notes (Signed)
Follow up - Trauma and Critical Care  Patient Details:    Chad Rogers is an 80 y.o. male.  Lines/tubes : Airway 8 mm (Active)  Secured at (cm) 24 cm 01/03/2016  7:40 AM  Measured From Lips 01/03/2016  7:40 AM  [place] 01/03/2016  7:40 AM  Secured By [employer] 01/03/2016  7:40 AM  Tube Holder Repositioned Yes 01/03/2016  7:40 AM  Cuff Pressure (cm H2O) 26 cm H2O 01/03/2016  7:40 AM  Site Condition Dry 01/03/2016  7:40 AM     PICC Triple Lumen [id number] PICC Right Basilic 38 cm 0 cm (Active)  Indication for Insertion or Continuance of Line Vasoactive infusions;Prolonged intravenous therapies 01/02/2016  8:00 PM  Exposed Catheter (cm) 0 cm 12/30/2015  3:21 PM  Site Assessment Clean;Dry;Intact 01/02/2016  8:00 PM  Lumen #1 Status Infusing;Flushed;Blood return noted 01/02/2016  8:00 PM  Lumen #2 Status Infusing;Flushed;Blood return noted 01/02/2016  8:00 PM  Lumen #3 Status Infusing;Flushed;Blood return noted 01/02/2016  8:00 PM  Dressing Type Transparent 01/02/2016  8:00 PM  Dressing Status Clean;Dry;Intact;Antimicrobial disc in place 01/02/2016  8:00 PM  Line Care Connections checked and tightened;Zeroed and calibrated 01/02/2016  8:00 PM  Dressing Change Due 01/06/16 01/02/2016  8:00 PM     Arterial Line 12/31/15 Left Radial (Active)  Site Assessment Clean;Dry;Intact 01/02/2016  8:00 PM  Line Status Pulsatile blood flow 01/02/2016  8:00 PM  Art Line Waveform Other (Comment) 01/02/2016  8:00 PM  Art Line Interventions Zeroed and calibrated 01/02/2016  8:00 PM  Color/Movement/Sensation Capillary refill less than 3 sec 01/02/2016  8:00 PM  Dressing Type Transparent 01/02/2016  8:00 PM  Dressing Status Clean;Dry;Intact;Antimicrobial disc in place 01/02/2016  8:00 PM  Dressing Change Due 01/08/16 01/02/2016  8:00 PM     Chest Tube Left (Active)  Suction -20 cm H2O 01/02/2016  8:00 PM  Chest Tube Air Leak Minimal 01/02/2016  8:00 PM  Patency Intervention Tip/tilt 01/02/2016   8:00 PM  Drainage Description Dark red 01/01/2016  4:00 PM  Dressing Status Clean;Dry;Intact;Old drainage 01/02/2016  8:00 PM  Dressing Intervention New dressing 12/30/2015 10:00 PM  Site Assessment Other (Comment) 01/02/2016  8:00 AM  Surrounding Skin Unable to view 01/02/2016  8:00 PM  Output (mL) 14 mL 01/03/2016  6:00 AM     NG/OG Tube Orogastric 16 Fr. Right mouth (Active)  Site Assessment Clean;Intact 01/02/2016  8:00 PM  Ongoing Placement Verification Auscultation 01/02/2016  8:00 PM  Status Infusing tube feed 01/02/2016  8:00 PM  Intake (mL) 90 mL 01/02/2016  9:38 AM  Output (mL) 160 mL 01/01/2016  5:00 AM     Urethral Catheter Chad Anastasia Fiedler RN Non-latex 16 Fr. (Active)  Indication for Insertion or Continuance of Catheter Acute urinary retention;Aggressive IV diuresis 01/02/2016  8:00 PM  Site Assessment Clean;Intact;Dry 01/02/2016  8:00 PM  Catheter Maintenance Bag below level of bladder;Catheter secured;Drainage bag/tubing not touching floor;Insertion date on drainage bag;No dependent loops;Seal intact 01/02/2016  8:00 PM  Collection Container Standard drainage bag 01/02/2016  8:00 PM  Securement Method Securing device (Describe) 01/02/2016  8:00 PM  Urinary Catheter Interventions Unclamped 01/02/2016  8:00 AM  Output (mL) 180 mL 01/03/2016  8:00 AM    Microbiology/Sepsis markers: Results for orders placed or performed during the hospital encounter of 12/28/15  MRSA PCR Screening     Status: None   Collection Time: 12/29/15  5:06 AM  Result Value Ref Range Status   MRSA by PCR NEGATIVE NEGATIVE  Final    Comment:        The GeneXpert MRSA Assay (FDA approved for NASAL specimens only), is one component of a comprehensive MRSA colonization surveillance program. It is not intended to diagnose MRSA infection nor to guide or monitor treatment for MRSA infections.     Anti-infectives:  Anti-infectives    None      Best Practice/Protocols:  VTE Prophylaxis: Lovenox (prophylaxtic  dose) and Mechanical GI Prophylaxis: Proton Pump Inhibitor Continous Sedation  Consults: Treatment Team:  [facility], MD [facility], MD    Events:  Subjective:    Overnight Issues: Patient did not have any specific issues overnight.  Still in an irregular rhythm with wide complex tachycardia.  Objective:  Vital signs for last 24 hours: Temp:  [98.5 F (36.9 C)-99.1 F (37.3 C)] 98.6 F (37 C) (08/16 0400) Pulse Rate:  [35-127] 127 (08/16 0800) Resp:  [7-21] 19 (08/16 0800) BP: (85-156)/(44-109) 112/90 (08/16 0800) SpO2:  [95 %-100 %] 98 % (08/16 0800) Arterial Line BP: (105-156)/(35-55) 156/45 (08/16 0800) FiO2 (%):  [40 %] 40 % (08/16 0800) Weight:  [84.6 kg (186 lb 8.2 oz)] 84.6 kg (186 lb 8.2 oz) (08/16 0411)  Hemodynamic parameters for last 24 hours: CVP:  [5 mmHg-16 mmHg] 12 mmHg  Intake/Output from previous day: 08/15 0701 - 08/16 0700 In: [id number] [I.V.:1466.2; NG/GT:1650; IV Piggyback:550] Out: 2109 [Urine:2095; Chest Tube:14]  Intake/Output this shift: Total I/O In: 115 [I.V.:50; NG/GT:65] Out: 180 [Urine:180]  Vent settings for last 24 hours: Vent Mode: PSV;CPAP FiO2 (%):  [40 %] 40 % Set Rate:  [18 bmp] 18 bmp Vt Set:  [540 mL] 540 mL PEEP:  [5 cmH20] 5 cmH20 Pressure Support:  [5 cmH20] 5 cmH20 Plateau Pressure:  [15 cmH20-16 cmH20] 16 cmH20  Physical Exam:  General: no respiratory distress and Opens eyes to verbal Neuro: nonfocal exam, RASS 0 and RASS -1 Resp: rhonchi bilaterally and no air leak from chest tube CVS: IRR GI: soft, nontender, BS WNL, no r/g and tolerating tube feedings. Extremities: Very friable skin and ecchymotic  Results for orders placed or performed during the hospital encounter of 12/28/15 (from the past 24 hour(s))  Glucose, capillary     Status: Abnormal   Collection Time: 01/02/16 12:26 PM  Result Value Ref Range   Glucose-Capillary 142 (H) 65 - 99 mg/dL   Comment 1 Notify RN    Comment 2 Document in  Chart   Glucose, capillary     Status: Abnormal   Collection Time: 01/02/16  4:11 PM  Result Value Ref Range   Glucose-Capillary 131 (H) 65 - 99 mg/dL   Comment 1 Notify RN    Comment 2 Document in Chart   Basic metabolic panel     Status: Abnormal   Collection Time: 01/02/16  4:39 PM  Result Value Ref Range   Sodium 128 (L) 135 - 145 mmol/L   Potassium 3.9 3.5 - 5.1 mmol/L   Chloride 95 (L) 101 - 111 mmol/L   CO2 25 22 - 32 mmol/L   Glucose, Bld 175 (H) 65 - 99 mg/dL   BUN 29 (H) 6 - 20 mg/dL   Creatinine, Ser 0.71 0.61 - 1.24 mg/dL   Calcium 8.1 (L) 8.9 - 10.3 mg/dL   GFR calc non Af Amer >60 >60 mL/min   GFR calc Af Amer >60 >60 mL/min   Anion gap 8 5 - 15  Magnesium     Status: None   Collection Time: 01/02/16  4:39 PM  Result Value Ref Range   Magnesium 1.9 1.7 - 2.4 mg/dL  Glucose, capillary     Status: Abnormal   Collection Time: 01/02/16  8:08 PM  Result Value Ref Range   Glucose-Capillary 157 (H) 65 - 99 mg/dL  Glucose, capillary     Status: Abnormal   Collection Time: 01/02/16 11:43 PM  Result Value Ref Range   Glucose-Capillary 121 (H) 65 - 99 mg/dL  Glucose, capillary     Status: Abnormal   Collection Time: 01/03/16  3:55 AM  Result Value Ref Range   Glucose-Capillary 164 (H) 65 - 99 mg/dL  CBC with Differential/Platelet     Status: Abnormal   Collection Time: 01/03/16  4:10 AM  Result Value Ref Range   WBC 7.9 4.0 - 10.5 K/uL   RBC 3.40 (L) 4.22 - 5.81 MIL/uL   Hemoglobin 11.1 (L) 13.0 - 17.0 g/dL   HCT 33.4 (L) 39.0 - 52.0 %   MCV 98.2 78.0 - 100.0 fL   MCH 32.6 26.0 - 34.0 pg   MCHC 33.2 30.0 - 36.0 g/dL   RDW 13.8 11.5 - 15.5 %   Platelets 158 150 - 400 K/uL   Neutrophils Relative % 65 %   Neutro Abs 5.1 1.7 - 7.7 K/uL   Lymphocytes Relative 21 %   Lymphs Abs 1.7 0.7 - 4.0 K/uL   Monocytes Relative 13 %   Monocytes Absolute 1.0 0.1 - 1.0 K/uL   Eosinophils Relative 1 %   Eosinophils Absolute 0.0 0.0 - 0.7 K/uL   Basophils Relative 0 %    Basophils Absolute 0.0 0.0 - 0.1 K/uL  Basic metabolic panel     Status: Abnormal   Collection Time: 01/03/16  4:10 AM  Result Value Ref Range   Sodium 130 (L) 135 - 145 mmol/L   Potassium 4.1 3.5 - 5.1 mmol/L   Chloride 96 (L) 101 - 111 mmol/L   CO2 26 22 - 32 mmol/L   Glucose, Bld 154 (H) 65 - 99 mg/dL   BUN 28 (H) 6 - 20 mg/dL   Creatinine, Ser 0.70 0.61 - 1.24 mg/dL   Calcium 8.4 (L) 8.9 - 10.3 mg/dL   GFR calc non Af Amer >60 >60 mL/min   GFR calc Af Amer >60 >60 mL/min   Anion gap 8 5 - 15     Assessment/Plan:   NEURO  Altered Mental Status:  obtundation   Plan: Wean sedation as tolerated.  Allow mental status to improve  PULM  Atelectasis/collapse (focal and RML) Chest Wall Trauma rib fractures and Pneumothorax (traumatic)   Plan: No air leak.  Subcutaneous air.  Tiny apical PTX on CXR.  Lots of secretions.  CARDIO  Atrial Fibrillation (with rapid ventricular response and wide complex) and Ventricular Premature Beats (without hemodynamic compromise)   Plan: Cardiology is following the patient.    RENAL  Urine output and renal function is normal   Plan: CPM  GI  No specific issues   Plan: Tolerating tube feedings which we will continue  ID  Has purulent tracheal aspirate.  No confirmed infection.  WBC is normal and the patient has no fevers   Plan: Will send tracheal aspirate for culture  HEME  Anemia anemia of critical illness)   Plan: Only mild anemia, no blood needed  ENDO No specific issues   Plan: CPM  Global Issues  Patient is actually weaning on the ventilator very well and weaned for a long time yesterday.  Was told that he has very thick and foul-smelling respiratory secretions.  This may need to be addressed prior to extubation.  Family conference planned for later today.    LOS: 5 days   Additional comments:I reviewed the patient's new clinical lab test results. cbc/bmet and I reviewed the patients new imaging test results. cbc/bmet  CXR also  reviewed.  Critical Care Total Time*: 30 Minutes  Chad Rogers 01/03/2016  *Care during the described time interval was provided by me and/or other providers on the critical care team.  I have reviewed this patient's available data, including medical history, events of note, physical examination and test results as part of my evaluation.

## 2016-01-04 ENCOUNTER — Inpatient Hospital Stay (HOSPITAL_COMMUNITY): Payer: Medicare Other

## 2016-01-04 DIAGNOSIS — I4891 Unspecified atrial fibrillation: Secondary | ICD-10-CM

## 2016-01-04 LAB — GLUCOSE, CAPILLARY
GLUCOSE-CAPILLARY: 168 mg/dL — AB (ref 65–99)
GLUCOSE-CAPILLARY: 205 mg/dL — AB (ref 65–99)
Glucose-Capillary: 166 mg/dL — ABNORMAL HIGH (ref 65–99)
Glucose-Capillary: 234 mg/dL — ABNORMAL HIGH (ref 65–99)
Glucose-Capillary: 166 mg/dL — ABNORMAL HIGH (ref 65–99)
Glucose-Capillary: 160 mg/dL — ABNORMAL HIGH (ref 65–99)

## 2016-01-04 LAB — POCT I-STAT 3, ART BLOOD GAS (G3+)
ACID-BASE EXCESS: 3 mmol/L — AB (ref 0.0–2.0)
Bicarbonate: 26.8 mEq/L — ABNORMAL HIGH (ref 20.0–24.0)
O2 Saturation: 96 %
PH ART: 7.424 (ref 7.350–7.450)
Patient temperature: 101.5
TCO2: 28 mmol/L (ref 0–100)
pCO2 arterial: 41.6 mmHg (ref 35.0–45.0)
pO2, Arterial: 86 mmHg (ref 80.0–100.0)

## 2016-01-04 LAB — CBC WITH DIFFERENTIAL/PLATELET
BASOS PCT: 0 %
Basophils Absolute: 0 10*3/uL (ref 0.0–0.1)
Eosinophils Absolute: 0 10*3/uL (ref 0.0–0.7)
Eosinophils Relative: 0 %
HEMATOCRIT: 34.5 % — AB (ref 39.0–52.0)
HEMOGLOBIN: 11.5 g/dL — AB (ref 13.0–17.0)
LYMPHS ABS: 1.9 10*3/uL (ref 0.7–4.0)
Lymphocytes Relative: 16 %
MCH: 33.9 pg (ref 26.0–34.0)
MCHC: 33.3 g/dL (ref 30.0–36.0)
MCV: 101.8 fL — ABNORMAL HIGH (ref 78.0–100.0)
MONOS PCT: 16 %
Monocytes Absolute: 1.9 10*3/uL — ABNORMAL HIGH (ref 0.1–1.0)
NEUTROS ABS: 8.2 10*3/uL — AB (ref 1.7–7.7)
NEUTROS PCT: 68 %
Platelets: 183 10*3/uL (ref 150–400)
RBC: 3.39 MIL/uL — ABNORMAL LOW (ref 4.22–5.81)
RDW: 14.3 % (ref 11.5–15.5)
WBC: 12 10*3/uL — ABNORMAL HIGH (ref 4.0–10.5)

## 2016-01-04 LAB — BASIC METABOLIC PANEL
ANION GAP: 9 (ref 5–15)
BUN: 23 mg/dL — ABNORMAL HIGH (ref 6–20)
CHLORIDE: 96 mmol/L — AB (ref 101–111)
CO2: 29 mmol/L (ref 22–32)
CREATININE: 0.68 mg/dL (ref 0.61–1.24)
Calcium: 8.6 mg/dL — ABNORMAL LOW (ref 8.9–10.3)
GFR calc non Af Amer: >60 mL/min (ref >60)
Glucose, Bld: 165 mg/dL — ABNORMAL HIGH (ref 65–99)
Potassium: 4.1 mmol/L (ref 3.5–5.1)
Sodium: 134 mmol/L — ABNORMAL LOW (ref 135–145)

## 2016-01-04 MED ORDER — PIPERACILLIN-TAZOBACTAM 3.375 G IVPB
3.3750 g | Freq: Three times a day (TID) | INTRAVENOUS | Status: DC
  Administered 2016-01-04 – 2016-01-08 (×12): 3.375 g via INTRAVENOUS
  Filled 2016-01-04 (×13): qty 50

## 2016-01-04 MED ORDER — IPRATROPIUM BROMIDE 0.02 % IN SOLN
0.5000 mg | RESPIRATORY_TRACT | Status: DC
  Administered 2016-01-04 – 2016-01-09 (×30): 0.5 mg via RESPIRATORY_TRACT
  Filled 2016-01-04 (×30): qty 2.5

## 2016-01-04 MED ORDER — LEVALBUTEROL HCL 0.63 MG/3ML IN NEBU
0.6300 mg | INHALATION_SOLUTION | RESPIRATORY_TRACT | Status: DC
  Administered 2016-01-04 – 2016-01-09 (×30): 0.63 mg via RESPIRATORY_TRACT
  Filled 2016-01-04 (×30): qty 3

## 2016-01-04 MED ORDER — FUROSEMIDE 10 MG/ML IJ SOLN
80.0000 mg | Freq: Once | INTRAMUSCULAR | Status: AC
  Administered 2016-01-04: 80 mg via INTRAVENOUS
  Filled 2016-01-04: qty 8

## 2016-01-04 NOTE — Consult Note (Signed)
Orthopaedic Trauma Service (OTS) Consult   Reason for Consult: Pelvic ring fracture status post ground-level fall Referring Physician: Georganna Skeans, M.D., trauma service   HPI: Chad Rogers is an 80 y.o. male sustaining a ground-level fall on 12/28/2015, patient has a very complex medical history including CHF, alcohol abuse, nicotine dependence, CAD, COPD, hypertension. Patient was at home when he slipped. He fell and hit his left side. Left-sided rib pain. Patient was brought to the emergency department at [facility]. Workup was notable for multiple left-sided rib fractures and a small pneumothorax, L3-4 DVT fractures and left pelvic ring/acetabular fracture. Patient was transferred to [place] to the trauma service. Per report patient drinks 12 beers daily and smokes 2-3. The patient did have an epidural catheter placed for his rib fractures are hospital day. Evening of hospital day #2 patient became more somnolent and was subsequently intubated. Patient remains intubated at this time.  Orthopedic trauma service consulted regarding pelvic ring fracture.   Exam is limited as patient is intubated  Past Medical History:  Diagnosis Date  . Alcohol abuse    family reports that [pt will drink at least 6 beers or more a day,   . Allergic rhinitis   . Cardiomyopathy [id number]   Acute systolic CHF at presentation; 05/2009 cardiac cath- 06/01/09  non obst coronary artery disease with EF 25%  . CHF (congestive heart failure) ([place])   . COPD (chronic obstructive pulmonary disease) ([place])   . Coronary artery disease   . Hypertension   . Osteoarthritis   . Psoriasis   . Pulmonary fibrosis ([place])    12/2004 with basilar fibrotic changes;  PFT's 06/07/09 FEV1 1.98 (68%) raio 53 no better after B2,  DLC0105%  . Skin cancer   . Tobacco abuse    100 pack years; quit    Past Surgical History:  Procedure Laterality Date  . HAND SURGERY    . INGUINAL HERNIA REPAIR    . ROTATOR CUFF  REPAIR      Family History  Problem Relation Age of Onset  . Cancer Mother 14    gynecologic  . Aneurysm Father 67    cerebral aneurysm  . Asthma Maternal Grandmother     Social History:  reports that he has been smoking Cigarettes.  He has a 100.00 pack-year smoking history. He has quit using smokeless tobacco. He reports that he drinks about 17.5 oz of alcohol per week . His drug history is not on file.  Allergies: No Known Allergies  Medications:  I have reviewed the patient's current medications. Prior to Admission:  Prescriptions Prior to Admission  Medication Sig Dispense Refill Last Dose  . albuterol (PROVENTIL HFA) 108 (90 BASE) MCG/ACT inhaler Inhale 2 puffs into the lungs every 6 (six) hours as needed.     unknown  . aspirin 81 MG tablet Take 81 mg by mouth daily.     unknown  . temazepam (RESTORIL) 15 MG capsule Take 15-30 mg by mouth at bedtime as needed for sleep.  0 unknown    Results for orders placed or performed during the hospital encounter of 12/28/15 (from the past 48 hour(s))  Glucose, capillary     Status: Abnormal   Collection Time: 01/02/16 12:26 PM  Result Value Ref Range   Glucose-Capillary 142 (H) 65 - 99 mg/dL   Comment 1 Notify RN    Comment 2 Document in Chart   Glucose, capillary     Status: Abnormal   Collection Time:  01/02/16  4:11 PM  Result Value Ref Range   Glucose-Capillary 131 (H) 65 - 99 mg/dL   Comment 1 Notify RN    Comment 2 Document in Chart   Basic metabolic panel     Status: Abnormal   Collection Time: 01/02/16  4:39 PM  Result Value Ref Range   Sodium 128 (L) 135 - 145 mmol/L   Potassium 3.9 3.5 - 5.1 mmol/L   Chloride 95 (L) 101 - 111 mmol/L   CO2 25 22 - 32 mmol/L   Glucose, Bld 175 (H) 65 - 99 mg/dL   BUN 29 (H) 6 - 20 mg/dL   Creatinine, Ser 0.71 0.61 - 1.24 mg/dL   Calcium 8.1 (L) 8.9 - 10.3 mg/dL   GFR calc non Af Amer >60 >60 mL/min   GFR calc Af Amer >60 >60 mL/min    Comment: (NOTE) The eGFR has been  calculated using the CKD EPI equation. This calculation has not been validated in all clinical situations. eGFR's persistently <60 mL/min signify possible Chronic Kidney Disease.    Anion gap 8 5 - 15  Magnesium     Status: None   Collection Time: 01/02/16  4:39 PM  Result Value Ref Range   Magnesium 1.9 1.7 - 2.4 mg/dL  Glucose, capillary     Status: Abnormal   Collection Time: 01/02/16  8:08 PM  Result Value Ref Range   Glucose-Capillary 157 (H) 65 - 99 mg/dL  Glucose, capillary     Status: Abnormal   Collection Time: 01/02/16 11:43 PM  Result Value Ref Range   Glucose-Capillary 121 (H) 65 - 99 mg/dL  Glucose, capillary     Status: Abnormal   Collection Time: 01/03/16  3:55 AM  Result Value Ref Range   Glucose-Capillary 164 (H) 65 - 99 mg/dL  CBC with Differential/Platelet     Status: Abnormal   Collection Time: 01/03/16  4:10 AM  Result Value Ref Range   WBC 7.9 4.0 - 10.5 K/uL   RBC 3.40 (L) 4.22 - 5.81 MIL/uL   Hemoglobin 11.1 (L) 13.0 - 17.0 g/dL   HCT 33.4 (L) 39.0 - 52.0 %   MCV 98.2 78.0 - 100.0 fL   MCH 32.6 26.0 - 34.0 pg   MCHC 33.2 30.0 - 36.0 g/dL   RDW 13.8 11.5 - 15.5 %   Platelets 158 150 - 400 K/uL   Neutrophils Relative % 65 %   Neutro Abs 5.1 1.7 - 7.7 K/uL   Lymphocytes Relative 21 %   Lymphs Abs 1.7 0.7 - 4.0 K/uL   Monocytes Relative 13 %   Monocytes Absolute 1.0 0.1 - 1.0 K/uL   Eosinophils Relative 1 %   Eosinophils Absolute 0.0 0.0 - 0.7 K/uL   Basophils Relative 0 %   Basophils Absolute 0.0 0.0 - 0.1 K/uL  Basic metabolic panel     Status: Abnormal   Collection Time: 01/03/16  4:10 AM  Result Value Ref Range   Sodium 130 (L) 135 - 145 mmol/L   Potassium 4.1 3.5 - 5.1 mmol/L   Chloride 96 (L) 101 - 111 mmol/L   CO2 26 22 - 32 mmol/L   Glucose, Bld 154 (H) 65 - 99 mg/dL   BUN 28 (H) 6 - 20 mg/dL   Creatinine, Ser 0.70 0.61 - 1.24 mg/dL   Calcium 8.4 (L) 8.9 - 10.3 mg/dL   GFR calc non Af Amer >60 >60 mL/min   GFR calc Af Amer >60 >60  mL/min  Comment: (NOTE) The eGFR has been calculated using the CKD EPI equation. This calculation has not been validated in all clinical situations. eGFR's persistently <60 mL/min signify possible Chronic Kidney Disease.    Anion gap 8 5 - 15  Glucose, capillary     Status: Abnormal   Collection Time: 01/03/16  8:56 AM  Result Value Ref Range   Glucose-Capillary 170 (H) 65 - 99 mg/dL  Glucose, capillary     Status: Abnormal   Collection Time: 01/03/16 11:38 AM  Result Value Ref Range   Glucose-Capillary 139 (H) 65 - 99 mg/dL  Culture, respiratory (NON-Expectorated)     Status: None (Preliminary result)   Collection Time: 01/03/16 11:55 AM  Result Value Ref Range   Specimen Description TRACHEAL ASPIRATE    Special Requests Normal    Gram Stain      ABUNDANT WBC PRESENT, PREDOMINANTLY PMN NO SQUAMOUS EPITHELIAL CELLS SEEN ABUNDANT GRAM NEGATIVE COCCOBACILLI FEW GRAM POSITIVE RODS FEW GRAM POSITIVE COCCI IN PAIRS RARE GRAM NEGATIVE RODS    Culture PENDING    Report Status PENDING   Glucose, capillary     Status: Abnormal   Collection Time: 01/03/16  4:17 PM  Result Value Ref Range   Glucose-Capillary 178 (H) 65 - 99 mg/dL  Glucose, capillary     Status: Abnormal   Collection Time: 01/03/16  8:11 PM  Result Value Ref Range   Glucose-Capillary 188 (H) 65 - 99 mg/dL  Glucose, capillary     Status: Abnormal   Collection Time: 01/03/16 11:40 PM  Result Value Ref Range   Glucose-Capillary 203 (H) 65 - 99 mg/dL  Glucose, capillary     Status: Abnormal   Collection Time: 01/04/16  3:32 AM  Result Value Ref Range   Glucose-Capillary 166 (H) 65 - 99 mg/dL  CBC with Differential/Platelet     Status: Abnormal   Collection Time: 01/04/16  4:30 AM  Result Value Ref Range   WBC 12.0 (H) 4.0 - 10.5 K/uL   RBC 3.39 (L) 4.22 - 5.81 MIL/uL   Hemoglobin 11.5 (L) 13.0 - 17.0 g/dL   HCT 34.5 (L) 39.0 - 52.0 %   MCV 101.8 (H) 78.0 - 100.0 fL   MCH 33.9 26.0 - 34.0 pg   MCHC 33.3 30.0  - 36.0 g/dL   RDW 14.3 11.5 - 15.5 %   Platelets 183 150 - 400 K/uL   Neutrophils Relative % 68 %   Neutro Abs 8.2 (H) 1.7 - 7.7 K/uL   Lymphocytes Relative 16 %   Lymphs Abs 1.9 0.7 - 4.0 K/uL   Monocytes Relative 16 %   Monocytes Absolute 1.9 (H) 0.1 - 1.0 K/uL   Eosinophils Relative 0 %   Eosinophils Absolute 0.0 0.0 - 0.7 K/uL   Basophils Relative 0 %   Basophils Absolute 0.0 0.0 - 0.1 K/uL  Basic metabolic panel     Status: Abnormal   Collection Time: 01/04/16  4:30 AM  Result Value Ref Range   Sodium 134 (L) 135 - 145 mmol/L   Potassium 4.1 3.5 - 5.1 mmol/L   Chloride 96 (L) 101 - 111 mmol/L   CO2 29 22 - 32 mmol/L   Glucose, Bld 165 (H) 65 - 99 mg/dL   BUN 23 (H) 6 - 20 mg/dL   Creatinine, Ser 0.68 0.61 - 1.24 mg/dL   Calcium 8.6 (L) 8.9 - 10.3 mg/dL   GFR calc non Af Amer >60 >60 mL/min   GFR calc Af Amer >60 >  60 mL/min    Comment: (NOTE) The eGFR has been calculated using the CKD EPI equation. This calculation has not been validated in all clinical situations. eGFR's persistently <60 mL/min signify possible Chronic Kidney Disease.    Anion gap 9 5 - 15  Glucose, capillary     Status: Abnormal   Collection Time: 01/04/16  8:20 AM  Result Value Ref Range   Glucose-Capillary 168 (H) 65 - 99 mg/dL   Comment 1 Notify RN    Comment 2 Document in Chart   I-STAT 3, arterial blood gas (G3+)     Status: Abnormal   Collection Time: 01/04/16  9:08 AM  Result Value Ref Range   pH, Arterial 7.424 7.350 - 7.450   pCO2 arterial 41.6 35.0 - 45.0 mmHg   pO2, Arterial 86.0 80.0 - 100.0 mmHg   Bicarbonate 26.8 (H) 20.0 - 24.0 mEq/L   TCO2 28 0 - 100 mmol/L   O2 Saturation 96.0 %   Acid-Base Excess 3.0 (H) 0.0 - 2.0 mmol/L   Patient temperature 101.5 F    Collection site FEMORAL ARTERY    Drawn by Operator    Sample type ARTERIAL     Dg Chest Port 1 View  Result Date: 01/04/2016 CLINICAL DATA:  Intubated. EXAM: PORTABLE CHEST 1 VIEW COMPARISON:  01/03/2016 FINDINGS: The  endotracheal tube is in satisfactory position. Stable left chest tube. The previously demonstrated small left apical pneumothorax is no longer clearly visualized. Left rib fractures and subcutaneous emphysema are again demonstrated. The cardiac silhouette remains enlarged and the interstitial markings remain prominent. No pleural fluid seen. Diffuse osteopenia. IMPRESSION: 1. The previously seen small left apical pneumothorax is no longer clearly visualized. 2. Mildly decreased subcutaneous emphysema on the left. 3. Stable cardiomegaly and interstitial pulmonary edema. Electronically Signed   By: Claudie Revering M.D.   On: 01/04/2016 08:26   Dg Chest Port 1 View  Result Date: 01/03/2016 CLINICAL DATA:  Cardiomyopathy, traumatic rib fractures and pneumothorax on the left, acute on chronic CHF, COPD, current smoker. EXAM: PORTABLE CHEST 1 VIEW COMPARISON:  Portable chest of January 02, 2016 FINDINGS: The lungs are adequately inflated. Interstitial and alveolar opacity in the right mid lung persists. The interstitial markings elsewhere are mildly increased. The left chest tube is in stable position. A faint pleural line is visible in the left pulmonary apex there is no significant pleural effusion. There is left basilar atelectasis. The cardiac silhouette is enlarged though stable. The central pulmonary vascularity is prominent. The endotracheal tube lies 4 cm above the carina. The esophagogastric tube extends below the GE junction with the tip coiling in the gastric cardia. The right sided PICC line tip projects over the midportion of the SVC. IMPRESSION: Small left apical pneumothorax. Stable left chest tube. Slight interval increased prominence of the pulmonary interstitial markings consistent with interstitial edema. Stable right perihilar density consistent with pneumonia or atelectasis. The support tubes are in reasonable position. Electronically Signed   By: David  [place] M.D.   On: 01/03/2016 07:45    Review  of Systems  Unable to perform ROS: Intubated   Blood pressure 114/69, pulse (!) 133, temperature (!) 101.5 F (38.6 C), temperature source Axillary, resp. rate (!) 24, height 5' 10"  (1.778 m), weight 81.5 kg (179 lb 10.8 oz), SpO2 97 %. Physical Exam  Constitutional: He is sedated and intubated.  Pulmonary/Chest: He is intubated.  Musculoskeletal:  Unable to obtain good exam.  Wil wait until patient is extubated to perform comprehensive  musculoskeletal   Imaging  CT scan of abdomen and pelvis was reviewed. Demonstrates a high pubic ramus fracture on the left with minimal anterior wall involvement of the left acetabulum. Patient has extensive degenerative changes throughout his pelvis including bilateral hips  Assessment/Plan:  80 y/o male s/p ground level fall   -Left pelvic ring fracture/anterior wall acetabulum fracture.  Given the mechanism and overall clinical picture this fracture is more of an insufficiency type fracture.  Suspect patient has very poor bone density given chronic alcohol use   Patient can be weightbearing as tolerated on left lower extremity once extubated has no range of motion restrictions  More detailed examination to be completed once patient is extubated  SCDs  Coagulation her primary service  - Dispo:  Continue per trauma service   Jari Pigg, PA-C [facility] [phone] (P) 01/04/2016, 10:12 AM

## 2016-01-04 NOTE — Progress Notes (Signed)
Family meeting held with this RN Case Manager, MD, bedside RN, and multiple family members in attendance. MD discussed pt's status and expected prognosis in detail with family; answered questions.  Family made aware that pt will likely need tracheostomy and PEG in very near future; they would like to have a little time to discuss with the rest of family before this is done.  Will continue to follow progress/follow for discharge planning.    Reinaldo Raddle, RN, BSN  Trauma/Neuro ICU Case Manager [phone]

## 2016-01-04 NOTE — Progress Notes (Signed)
    Subjective:  Intubated, sedated  Objective:  Vital Signs in the last 24 hours: Temp:  [97.9 F (36.6 C)-101.5 F (38.6 C)] 98.5 F (36.9 C) (08/17 1200) Pulse Rate:  [42-141] 116 (08/17 1400) Resp:  [15-35] 22 (08/17 1400) BP: (93-126)/(60-98) 108/64 (08/17 1400) SpO2:  [82 %-100 %] 94 % (08/17 1400) FiO2 (%):  [40 %] 40 % (08/17 1400) Weight:  [81.5 kg (179 lb 10.8 oz)] 81.5 kg (179 lb 10.8 oz) (08/17 0300)  Intake/Output from previous day: 08/16 0701 - 08/17 0700 In: 3598.3 [I.V.:1988.3; NG/GT:1560; IV Piggyback:50] Out: 3010 [Urine:3010]  Physical Exam: Pt is intubated and sedated HEENT: normal Neck: JVP - normal Lungs: CTA bilaterally CV: irregularly irregular Abd: soft, NT Ext: no C/C/E Skin: warm/dry no rash   Lab Results:  Recent Labs  01/03/16 0410 01/04/16 0430  WBC 7.9 12.0*  HGB 11.1* 11.5*  PLT 158 183    Recent Labs  01/03/16 0410 01/04/16 0430  NA 130* 134*  K 4.1 4.1  CL 96* 96*  CO2 26 29  GLUCOSE 154* 165*  BUN 28* 23*  CREATININE 0.70 0.68   No results for input(s): TROPONINI in the last 72 hours.  Invalid input(s): CK, MB  Tele: Atrial fibrillation with RVR, RBBB morphology QRS  Assessment/Plan:  Atrial fibrillation with RVR: still with elevated HR in the 100-120 range. BP borderline. Continue IV amiodarone at current rate another 24 hours then reduce to 30 mg/hr tomorrow. Not a candidate for anticoagulation.  Continue metoprolol 12.5 mg Q 6 hours per tube.  Sherren Mocha, M.D. 01/04/2016, 2:43 PM

## 2016-01-04 NOTE — Progress Notes (Signed)
Pharmacy Antibiotic Note  Chad Rogers is a 80 y.o. male admitted on 12/28/2015 with fever, to start empiric abx, possible PNA.Marland Kitchen  Pharmacy has been consulted for zosyn dosing. WBC 12, creat 0.68, temp 101.5  Plan: Zosyn 3.375g IV q8h (4 hour infusion).  Pharmacy to sign off  Height: 5\' 10"  (177.8 cm) Weight: 179 lb 10.8 oz (81.5 kg) IBW/kg (Calculated) : 73  Temp (24hrs), Avg:99.2 F (37.3 C), Min:97.6 F (36.4 C), Max:101.5 F (38.6 C)   Recent Labs Lab 12/28/15 2340  12/31/15 0330  12/31/15 0856  01/01/16 0340 01/02/16 0529 01/02/16 1639 01/03/16 0410 01/04/16 0430  WBC  --   < > 16.2*  --   --   --  9.5 7.8  --  7.9 12.0*  CREATININE  --   < > 1.41*  < >  --   < > 0.85 0.77 0.71 0.70 0.68  LATICACIDVEN 5.63*  --  4.0*  --  1.5  --   --   --   --   --   --   < > = values in this interval not displayed.  Estimated Creatinine Clearance: 79.8 mL/min (by C-G formula based on SCr of 0.8 mg/dL).    No Known Allergies Thank you for allowing pharmacy to be a part of this patient's care.  Eudelia Bunch, Pharm.D. [phone] 01/04/2016 8:52 AM

## 2016-01-04 NOTE — Care Management Important Message (Signed)
Important Message  Patient Details  Name: Chad Rogers MRN: [id number] Date of Birth: 09/26/1935   Medicare Important Message Given:  Yes    Nathen May 01/04/2016, 11:48 AM

## 2016-01-04 NOTE — Progress Notes (Signed)
Pt placed back on full support d/t increased WOB, agitation, increased HR.  Pt looks "tired".  RN at bedside and aware.

## 2016-01-04 NOTE — Progress Notes (Signed)
Follow up - Trauma and Critical Care  Patient Details:    Chad Rogers is an 80 y.o. male.  Lines/tubes : Airway 8 mm (Active)  Secured at (cm) 24 cm 01/04/2016  7:36 AM  Measured From Lips 01/04/2016  7:36 AM  [place] 01/04/2016  7:36 AM  Secured By [employer] 01/04/2016  7:36 AM  Tube Holder Repositioned Yes 01/04/2016  7:36 AM  Cuff Pressure (cm H2O) 26 cm H2O 01/04/2016  7:36 AM  Site Condition Dry 01/04/2016  7:36 AM     PICC Triple Lumen [id number] PICC Right Basilic 38 cm 0 cm (Active)  Indication for Insertion or Continuance of Line Vasoactive infusions;Prolonged intravenous therapies 01/03/2016  8:00 PM  Exposed Catheter (cm) 0 cm 12/30/2015  3:21 PM  Site Assessment Clean;Dry;Intact 01/03/2016  8:00 PM  Lumen #1 Status Infusing;Flushed;Blood return noted 01/03/2016  8:00 PM  Lumen #2 Status Infusing;Flushed;Blood return noted 01/03/2016  8:00 PM  Lumen #3 Status Infusing;Flushed;Blood return noted;Other (Comment) 01/03/2016  8:00 PM  Dressing Type Transparent 01/03/2016  8:00 PM  Dressing Status Clean;Dry;Intact;Antimicrobial disc in place 01/03/2016  8:00 PM  Line Care Connections checked and tightened;Zeroed and calibrated;Leveled 01/03/2016  8:00 PM  Dressing Change Due 01/06/16 01/03/2016  8:00 PM     Chest Tube Left (Active)  Suction To water seal 01/03/2016  8:00 PM  Chest Tube Air Leak Minimal 01/03/2016  8:00 PM  Patency Intervention Tip/tilt 01/03/2016  8:00 PM  Drainage Description Dark red 01/03/2016  8:00 PM  Dressing Status Clean;Dry;Intact;Old drainage 01/03/2016  8:00 PM  Dressing Intervention New dressing 12/30/2015 10:00 PM  Site Assessment Other (Comment) 01/02/2016  8:00 AM  Surrounding Skin Unable to view 01/03/2016  8:00 PM  Output (mL) 0 mL 01/04/2016  6:20 AM     NG/OG Tube Orogastric 16 Fr. Right mouth (Active)  Site Assessment Clean;Intact 01/03/2016  8:00 PM  Ongoing Placement Verification Auscultation 01/03/2016  8:00 PM  Status  Infusing tube feed 01/03/2016  8:00 PM  Intake (mL) 90 mL 01/02/2016  9:38 AM  Output (mL) 160 mL 01/01/2016  5:00 AM     Urethral Catheter Darla Anastasia Fiedler RN Non-latex 16 Fr. (Active)  Indication for Insertion or Continuance of Catheter Acute urinary retention;Aggressive IV diuresis 01/03/2016  8:00 PM  Site Assessment Clean;Intact;Dry 01/03/2016  8:00 PM  Catheter Maintenance Bag below level of bladder;Catheter secured;Drainage bag/tubing not touching floor;Insertion date on drainage bag;No dependent loops;Seal intact 01/03/2016  8:00 PM  Collection Container Standard drainage bag 01/03/2016  8:00 PM  Securement Method Securing device (Describe) 01/03/2016  8:00 PM  Urinary Catheter Interventions Unclamped 01/03/2016  8:00 PM  Output (mL) 150 mL 01/04/2016  6:20 AM    Microbiology/Sepsis markers: Results for orders placed or performed during the hospital encounter of 12/28/15  MRSA PCR Screening     Status: None   Collection Time: 12/29/15  5:06 AM  Result Value Ref Range Status   MRSA by PCR NEGATIVE NEGATIVE Final    Comment:        The GeneXpert MRSA Assay (FDA approved for NASAL specimens only), is one component of a comprehensive MRSA colonization surveillance program. It is not intended to diagnose MRSA infection nor to guide or monitor treatment for MRSA infections.   Culture, respiratory (NON-Expectorated)     Status: None (Preliminary result)   Collection Time: 01/03/16 11:55 AM  Result Value Ref Range Status   Specimen Description TRACHEAL ASPIRATE  Final   Special Requests Normal  Final   Gram Stain   Final    ABUNDANT WBC PRESENT, PREDOMINANTLY PMN NO SQUAMOUS EPITHELIAL CELLS SEEN ABUNDANT GRAM NEGATIVE COCCOBACILLI FEW GRAM POSITIVE RODS FEW GRAM POSITIVE COCCI IN PAIRS RARE GRAM NEGATIVE RODS    Culture PENDING  Incomplete   Report Status PENDING  Incomplete    Anti-infectives:  Anti-infectives    None      Best Practice/Protocols:  VTE Prophylaxis: Lovenox  (prophylaxtic dose) and Mechanical GI Prophylaxis: Proton Pump Inhibitor Continous Sedation  Consults: Treatment Team:  [place] , MD [facility], MD    Events:  Subjective:    Overnight Issues: Patient switched to wean mode this AM and did not do nearly as well as yesterday..  Would not awaken for me at all.  Objective:  Vital signs for last 24 hours: Temp:  [97.6 F (36.4 C)-100.4 F (38 C)] 100.4 F (38 C) (08/17 0400) Pulse Rate:  [42-141] 131 (08/17 0736) Resp:  [15-35] 29 (08/17 0736) BP: (94-126)/(54-98) 111/83 (08/17 0700) SpO2:  [82 %-100 %] 95 % (08/17 0736) Arterial Line BP: (132-163)/(40-55) 151/46 (08/16 1200) FiO2 (%):  [40 %] 40 % (08/17 0736) Weight:  [81.5 kg (179 lb 10.8 oz)] 81.5 kg (179 lb 10.8 oz) (08/17 0300)  Hemodynamic parameters for last 24 hours:    Intake/Output from previous day: 08/16 0701 - 08/17 0700 In: 3598.3 [I.V.:1988.3; NG/GT:1560; IV Piggyback:50] Out: 3010 [Urine:3010]  Intake/Output this shift: No intake/output data recorded.  Vent settings for last 24 hours: Vent Mode: PSV;CPAP FiO2 (%):  [40 %] 40 % Set Rate:  [18 bmp] 18 bmp Vt Set:  [540 mL] 540 mL PEEP:  [5 cmH20] 5 cmH20 Pressure Support:  [5 cmH20-10 cmH20] 10 cmH20 Plateau Pressure:  [17 [id number] cmH20] 20 cmH20  Physical Exam:  General: no respiratory distress and not weaning as well. Neuro: RASS -1 and grimaces but would not move extremities.  No patellar DTRs Resp: rhonchi RLL and RML CVS: IRR and tachycardic GI: soft, nontender, BS WNL, no r/g and tolerating tube feedings well. Extremities: no edema, no erythema, pulses WNL  Results for orders placed or performed during the hospital encounter of 12/28/15 (from the past 24 hour(s))  Glucose, capillary     Status: Abnormal   Collection Time: 01/03/16  8:56 AM  Result Value Ref Range   Glucose-Capillary 170 (H) 65 - 99 mg/dL  Glucose, capillary     Status: Abnormal   Collection Time:  01/03/16 11:38 AM  Result Value Ref Range   Glucose-Capillary 139 (H) 65 - 99 mg/dL  Culture, respiratory (NON-Expectorated)     Status: None (Preliminary result)   Collection Time: 01/03/16 11:55 AM  Result Value Ref Range   Specimen Description TRACHEAL ASPIRATE    Special Requests Normal    Gram Stain      ABUNDANT WBC PRESENT, PREDOMINANTLY PMN NO SQUAMOUS EPITHELIAL CELLS SEEN ABUNDANT GRAM NEGATIVE COCCOBACILLI FEW GRAM POSITIVE RODS FEW GRAM POSITIVE COCCI IN PAIRS RARE GRAM NEGATIVE RODS    Culture PENDING    Report Status PENDING   Glucose, capillary     Status: Abnormal   Collection Time: 01/03/16  4:17 PM  Result Value Ref Range   Glucose-Capillary 178 (H) 65 - 99 mg/dL  Glucose, capillary     Status: Abnormal   Collection Time: 01/03/16  8:11 PM  Result Value Ref Range   Glucose-Capillary 188 (H) 65 - 99 mg/dL  Glucose, capillary     Status: Abnormal   Collection Time:  01/03/16 11:40 PM  Result Value Ref Range   Glucose-Capillary 203 (H) 65 - 99 mg/dL  Glucose, capillary     Status: Abnormal   Collection Time: 01/04/16  3:32 AM  Result Value Ref Range   Glucose-Capillary 166 (H) 65 - 99 mg/dL  CBC with Differential/Platelet     Status: Abnormal   Collection Time: 01/04/16  4:30 AM  Result Value Ref Range   WBC 12.0 (H) 4.0 - 10.5 K/uL   RBC 3.39 (L) 4.22 - 5.81 MIL/uL   Hemoglobin 11.5 (L) 13.0 - 17.0 g/dL   HCT 34.5 (L) 39.0 - 52.0 %   MCV 101.8 (H) 78.0 - 100.0 fL   MCH 33.9 26.0 - 34.0 pg   MCHC 33.3 30.0 - 36.0 g/dL   RDW 14.3 11.5 - 15.5 %   Platelets 183 150 - 400 K/uL   Neutrophils Relative % 68 %   Neutro Abs 8.2 (H) 1.7 - 7.7 K/uL   Lymphocytes Relative 16 %   Lymphs Abs 1.9 0.7 - 4.0 K/uL   Monocytes Relative 16 %   Monocytes Absolute 1.9 (H) 0.1 - 1.0 K/uL   Eosinophils Relative 0 %   Eosinophils Absolute 0.0 0.0 - 0.7 K/uL   Basophils Relative 0 %   Basophils Absolute 0.0 0.0 - 0.1 K/uL  Basic metabolic panel     Status: Abnormal    Collection Time: 01/04/16  4:30 AM  Result Value Ref Range   Sodium 134 (L) 135 - 145 mmol/L   Potassium 4.1 3.5 - 5.1 mmol/L   Chloride 96 (L) 101 - 111 mmol/L   CO2 29 22 - 32 mmol/L   Glucose, Bld 165 (H) 65 - 99 mg/dL   BUN 23 (H) 6 - 20 mg/dL   Creatinine, Ser 0.68 0.61 - 1.24 mg/dL   Calcium 8.6 (L) 8.9 - 10.3 mg/dL   GFR calc non Af Amer >60 >60 mL/min   GFR calc Af Amer >60 >60 mL/min   Anion gap 9 5 - 15     Assessment/Plan:   NEURO  Altered Mental Status:  obtundation   Plan: Will try to back off on sedation to see if he is neurologically fine  PULM  Atelectasis/collapse (focal and RML)   Plan: Check CXR today.  May have to empirically start on antibiotics because of fever and rising WBC  CARDIO  Atrial Fibrillation (with rapid ventricular response, without hemodynamic compromise and wide complex.  On amiodarone)   Plan: Cardiology is following this patient.  RENAL  Urine output has been adequate,  Renal function is good.   Plan: Did get Lasix yesterday and will get some today.  GI  Benign and tolerating tube feedings well   Plan: CPM  ID  No known infectious sources   Plan: Will empirically start Zosyn  HEME  Anemia acute blood loss anemia and very mild)   Plan: No blood for now  ENDO No specific issues   Plan: CPM  Global Issues  Patient not as responsive today, and not doing as well on ventilator weaning.  Will more than likely need tracheostomy next week sometime.    Will back off on sedation to see if the patient becomes more responsive.    LOS: 6 days   Additional comments:I reviewed the patient's new clinical lab test results. cbc/bmet and I reviewed the patients new imaging test results. cxr pending  Critical Care Total Time*: 30 Minutes  Clevester Jalaysha Skilton 01/04/2016  *Care during the described  time interval was provided by me and/or other providers on the critical care team.  I have reviewed this patient's available data, including medical history,  events of note, physical examination and test results as part of my evaluation.

## 2016-01-05 ENCOUNTER — Inpatient Hospital Stay (HOSPITAL_COMMUNITY): Payer: Medicare Other

## 2016-01-05 DIAGNOSIS — I5022 Chronic systolic (congestive) heart failure: Secondary | ICD-10-CM

## 2016-01-05 LAB — CBC WITH DIFFERENTIAL/PLATELET
BASOS ABS: 0 10*3/uL (ref 0.0–0.1)
BASOS PCT: 0 %
EOS ABS: 0 10*3/uL (ref 0.0–0.7)
Eosinophils Relative: 0 %
HCT: 34.2 % — ABNORMAL LOW (ref 39.0–52.0)
HEMOGLOBIN: 11.8 g/dL — AB (ref 13.0–17.0)
LYMPHS ABS: 3.2 10*3/uL (ref 0.7–4.0)
Lymphocytes Relative: 19 %
MCH: 34.6 pg — AB (ref 26.0–34.0)
MCHC: 34.5 g/dL (ref 30.0–36.0)
MCV: 100.3 fL — ABNORMAL HIGH (ref 78.0–100.0)
MONO ABS: 1.7 10*3/uL — AB (ref 0.1–1.0)
Monocytes Relative: 10 %
NEUTROS ABS: 12 10*3/uL — AB (ref 1.7–7.7)
Neutrophils Relative %: 71 %
PLATELETS: 204 10*3/uL (ref 150–400)
RBC: 3.41 MIL/uL — ABNORMAL LOW (ref 4.22–5.81)
RDW: 14.3 % (ref 11.5–15.5)
WBC: 16.9 10*3/uL — ABNORMAL HIGH (ref 4.0–10.5)

## 2016-01-05 LAB — GLUCOSE, CAPILLARY
GLUCOSE-CAPILLARY: 229 mg/dL — AB (ref 65–99)
GLUCOSE-CAPILLARY: 157 mg/dL — AB (ref 65–99)
Glucose-Capillary: 200 mg/dL — ABNORMAL HIGH (ref 65–99)
Glucose-Capillary: 206 mg/dL — ABNORMAL HIGH (ref 65–99)
Glucose-Capillary: 166 mg/dL — ABNORMAL HIGH (ref 65–99)
Glucose-Capillary: 150 mg/dL — ABNORMAL HIGH (ref 65–99)

## 2016-01-05 LAB — BASIC METABOLIC PANEL
Anion gap: 8 (ref 5–15)
BUN: 42 mg/dL — AB (ref 6–20)
CALCIUM: 8.9 mg/dL (ref 8.9–10.3)
CHLORIDE: 99 mmol/L — AB (ref 101–111)
CO2: 30 mmol/L (ref 22–32)
CREATININE: 1.01 mg/dL (ref 0.61–1.24)
GFR calc Af Amer: >60 mL/min (ref >60)
GFR calc non Af Amer: >60 mL/min (ref >60)
Glucose, Bld: 147 mg/dL — ABNORMAL HIGH (ref 65–99)
Potassium: 3.9 mmol/L (ref 3.5–5.1)
SODIUM: 137 mmol/L (ref 135–145)

## 2016-01-05 MED ORDER — WHITE PETROLATUM GEL
Status: AC
  Administered 2016-01-05: 22:00:00
  Filled 2016-01-05: qty 1

## 2016-01-05 MED ORDER — INSULIN ASPART 100 UNIT/ML ~~LOC~~ SOLN
0.0000 [IU] | SUBCUTANEOUS | Status: DC
  Administered 2016-01-05: 3 [IU] via SUBCUTANEOUS
  Administered 2016-01-05: 2 [IU] via SUBCUTANEOUS
  Administered 2016-01-05: 5 [IU] via SUBCUTANEOUS
  Administered 2016-01-06 (×2): 3 [IU] via SUBCUTANEOUS
  Administered 2016-01-06: 5 [IU] via SUBCUTANEOUS
  Administered 2016-01-06 (×2): 2 [IU] via SUBCUTANEOUS
  Administered 2016-01-06 – 2016-01-07 (×4): 3 [IU] via SUBCUTANEOUS
  Administered 2016-01-07: 2 [IU] via SUBCUTANEOUS
  Administered 2016-01-07 (×2): 3 [IU] via SUBCUTANEOUS
  Administered 2016-01-08 (×3): 2 [IU] via SUBCUTANEOUS
  Administered 2016-01-09: 3 [IU] via SUBCUTANEOUS
  Administered 2016-01-09: 2 [IU] via SUBCUTANEOUS
  Administered 2016-01-09: 3 [IU] via SUBCUTANEOUS
  Administered 2016-01-09 – 2016-01-10 (×2): 2 [IU] via SUBCUTANEOUS
  Administered 2016-01-10: 3 [IU] via SUBCUTANEOUS
  Administered 2016-01-11 (×3): 2 [IU] via SUBCUTANEOUS
  Administered 2016-01-11: 3 [IU] via SUBCUTANEOUS
  Administered 2016-01-11 – 2016-01-12 (×2): 2 [IU] via SUBCUTANEOUS
  Administered 2016-01-12: 3 [IU] via SUBCUTANEOUS
  Administered 2016-01-12 – 2016-01-13 (×5): 2 [IU] via SUBCUTANEOUS
  Administered 2016-01-13: 3 [IU] via SUBCUTANEOUS
  Administered 2016-01-14 – 2016-01-15 (×8): 2 [IU] via SUBCUTANEOUS
  Administered 2016-01-15: 3 [IU] via SUBCUTANEOUS
  Administered 2016-01-16 – 2016-01-17 (×5): 2 [IU] via SUBCUTANEOUS
  Administered 2016-01-18: 5 [IU] via SUBCUTANEOUS
  Administered 2016-01-18 – 2016-01-19 (×6): 2 [IU] via SUBCUTANEOUS
  Administered 2016-01-19: 3 [IU] via SUBCUTANEOUS
  Administered 2016-01-19: 2 [IU] via SUBCUTANEOUS

## 2016-01-05 MED ORDER — METOPROLOL TARTRATE 25 MG/10 ML ORAL SUSPENSION
12.5000 mg | Freq: Four times a day (QID) | ORAL | Status: DC
  Administered 2016-01-05 – 2016-01-10 (×15): 12.5 mg via ORAL
  Filled 2016-01-05 (×16): qty 10

## 2016-01-05 MED ORDER — SODIUM CHLORIDE 0.9 % IV SOLN
INTRAVENOUS | Status: DC
  Administered 2016-01-05: 14:00:00 via INTRAVENOUS
  Filled 2016-01-05 (×2): qty 1000

## 2016-01-05 MED ORDER — FUROSEMIDE 10 MG/ML IJ SOLN
80.0000 mg | Freq: Once | INTRAMUSCULAR | Status: AC
  Administered 2016-01-05: 80 mg via INTRAVENOUS
  Filled 2016-01-05: qty 8

## 2016-01-05 NOTE — Progress Notes (Signed)
Inpatient Diabetes Program Recommendations  AACE/ADA: New Consensus Statement on Inpatient Glycemic Control (2015)  Target Ranges:  Prepandial:   less than 140 mg/dL      Peak postprandial:   less than 180 mg/dL (1-2 hours)      Critically ill patients:  140 - 180 mg/dL   Lab Results  Component Value Date   GLUCAP 157 (H) 01/05/2016    Review of Glycemic Control:  Results for SAHR, CHAPNICK (MRN [id number]) as of 01/05/2016 10:58  Ref. Range 01/04/2016 21:30 01/04/2016 23:59 01/05/2016 03:17 01/05/2016 08:28  Glucose-Capillary Latest Ref Range: 65 - 99 mg/dL 160 (H) 205 (H) 229 (H) 157 (H)   Diabetes history: None Inpatient Diabetes Program Recommendations:   Note patient intubated and on tube feeds.  Please consider starting ICU glycemic control order set "Standard correction".   Thanks, Adah Perl, RN, BC-ADM Inpatient Diabetes Coordinator Pager [phone] (8a-5p)

## 2016-01-05 NOTE — Progress Notes (Signed)
Subjective: Pt intubated  Objective: Vitals:   01/05/16 1135 01/05/16 1200 01/05/16 1300 01/05/16 1400  BP: 115/83 107/79 109/70 122/77  Pulse:  (!) 50 (!) 118 (!) 121  Resp:  18 18 (!) 25  Temp:  (!) 101.2 F (38.4 C)    TempSrc:  Axillary    SpO2:  100% 100% 100%  Weight:      Height:       Weight change: -7.1 oz (-0.2 kg)  Intake/Output Summary (Last 24 hours) at 01/05/16 1422 Last data filed at 01/05/16 1400  Gross per 24 hour  Intake          3750.11 ml  Output             1615 ml  Net          2135.11 ml   I/O  + 14 L    General: Intubated  Neck:  JVP is diffiuclt to assess   Heart: Irregular rate and rhythm, without murmurs, rubs, gallops.  Lungs: Clear to auscultation.  No rales or wheezes. Exemities:  Tr  edema.   Neuro: INtubated  Sedated   Tele  Wide complex, probable afib with PVCs    Lab Results: Results for orders placed or performed during the hospital encounter of 12/28/15 (from the past 24 hour(s))  Glucose, capillary     Status: Abnormal   Collection Time: 01/04/16  3:30 PM  Result Value Ref Range   Glucose-Capillary 166 (H) 65 - 99 mg/dL   Comment 1 Notify RN    Comment 2 Document in Chart   Glucose, capillary     Status: Abnormal   Collection Time: 01/04/16  9:30 PM  Result Value Ref Range   Glucose-Capillary 160 (H) 65 - 99 mg/dL  Glucose, capillary     Status: Abnormal   Collection Time: 01/04/16 11:59 PM  Result Value Ref Range   Glucose-Capillary 205 (H) 65 - 99 mg/dL  Glucose, capillary     Status: Abnormal   Collection Time: 01/05/16  3:17 AM  Result Value Ref Range   Glucose-Capillary 229 (H) 65 - 99 mg/dL  CBC with Differential/Platelet     Status: Abnormal   Collection Time: 01/05/16  8:28 AM  Result Value Ref Range   WBC 16.9 (H) 4.0 - 10.5 K/uL   RBC 3.41 (L) 4.22 - 5.81 MIL/uL   Hemoglobin 11.8 (L) 13.0 - 17.0 g/dL   HCT 34.2 (L) 39.0 - 52.0 %   MCV 100.3 (H) 78.0 - 100.0 fL   MCH 34.6 (H) 26.0 - 34.0 pg   MCHC 34.5  30.0 - 36.0 g/dL   RDW 14.3 11.5 - 15.5 %   Platelets 204 150 - 400 K/uL   Neutrophils Relative % 71 %   Lymphocytes Relative 19 %   Monocytes Relative 10 %   Eosinophils Relative 0 %   Basophils Relative 0 %   Neutro Abs 12.0 (H) 1.7 - 7.7 K/uL   Lymphs Abs 3.2 0.7 - 4.0 K/uL   Monocytes Absolute 1.7 (H) 0.1 - 1.0 K/uL   Eosinophils Absolute 0.0 0.0 - 0.7 K/uL   Basophils Absolute 0.0 0.0 - 0.1 K/uL   Smear Review MORPHOLOGY UNREMARKABLE   Basic metabolic panel     Status: Abnormal   Collection Time: 01/05/16  8:28 AM  Result Value Ref Range   Sodium 137 135 - 145 mmol/L   Potassium 3.9 3.5 - 5.1 mmol/L   Chloride 99 (L) 101 - 111 mmol/L  CO2 30 22 - 32 mmol/L   Glucose, Bld 147 (H) 65 - 99 mg/dL   BUN 42 (H) 6 - 20 mg/dL   Creatinine, Ser 1.01 0.61 - 1.24 mg/dL   Calcium 8.9 8.9 - 10.3 mg/dL   GFR calc non Af Amer >60 >60 mL/min   GFR calc Af Amer >60 >60 mL/min   Anion gap 8 5 - 15  Glucose, capillary     Status: Abnormal   Collection Time: 01/05/16  8:28 AM  Result Value Ref Range   Glucose-Capillary 157 (H) 65 - 99 mg/dL  Glucose, capillary     Status: Abnormal   Collection Time: 01/05/16 11:13 AM  Result Value Ref Range   Glucose-Capillary 200 (H) 65 - 99 mg/dL  Glucose, capillary     Status: Abnormal   Collection Time: 01/05/16  1:27 PM  Result Value Ref Range   Glucose-Capillary 206 (H) 65 - 99 mg/dL    Studies/Results: Dg Chest Port 1 View  Result Date: 01/05/2016 CLINICAL DATA:  Pneumonia and intubation. EXAM: PORTABLE CHEST 1 VIEW COMPARISON:  01/04/2016 FINDINGS: Endotracheal tube is appropriately positioned above the carina. PICC line tip in the SVC region. Stable position of the left chest tube. Large amount of subcutaneous gas in the left chest. No evidence for a pneumothorax. Heart size remains mildly enlarged. Patchy densities in the right lower lung. Linear density at the left lung base could represent atelectasis. Aortic atherosclerotic calcifications.  Nasogastric tube is coiled in the stomach. IMPRESSION: Stable position of the left chest tube without a definite pneumothorax. Persistent patchy densities at the right lung base. Findings could represent airspace disease or atelectasis. Support apparatuses as described. Electronically Signed   By: Markus Daft M.D.   On: 01/05/2016 07:23    Medications: Reviewed    @PROBHOSP @  1  Rhythm  Tele  diffiucult  Still looks irreg like afib  ON amiodarone (appears to be fewer PVCs)  COntinue   2.  Chronic systolic CHF  I would hold on further IV lasix for now    LOS: 7 days   Chad Rogers 01/05/2016, 2:22 PM

## 2016-01-05 NOTE — Progress Notes (Signed)
Follow up - Trauma and Critical Care  Patient Details:    Chad Rogers is an 80 y.o. male.  Lines/tubes : Airway 8 mm (Active)  Secured at (cm) 23 cm 01/05/2016  7:12 AM  Measured From Lips 01/05/2016  7:12 AM  Secured Location Right 01/05/2016  7:12 AM  Secured By [employer] 01/05/2016  7:12 AM  Tube Holder Repositioned Yes 01/05/2016  7:12 AM  Cuff Pressure (cm H2O) 22 cm H2O 01/05/2016  3:17 AM  Site Condition Dry 01/05/2016  7:12 AM     PICC Triple Lumen [id number] PICC Right Basilic 38 cm 0 cm (Active)  Indication for Insertion or Continuance of Line Prolonged intravenous therapies 01/05/2016 12:00 AM  Exposed Catheter (cm) 0 cm 12/30/2015  3:21 PM  Site Assessment Clean;Dry;Intact 01/05/2016 12:00 AM  Lumen #1 Status Infusing 01/05/2016 12:00 AM  Lumen #2 Status Infusing 01/05/2016 12:00 AM  Lumen #3 Status Infusing 01/05/2016 12:00 AM  Dressing Type Transparent 01/05/2016 12:00 AM  Dressing Status Clean;Dry;Intact;Antimicrobial disc in place 01/05/2016 12:00 AM  Line Care Connections checked and tightened 01/05/2016 12:00 AM  Dressing Change Due 01/06/16 01/05/2016 12:00 AM     Chest Tube Left (Active)  Suction To water seal 01/05/2016 12:00 AM  Chest Tube Air Leak None 01/05/2016 12:00 AM  Patency Intervention Tip/tilt 01/05/2016 12:00 AM  Drainage Description Dark red 01/05/2016 12:00 AM  Dressing Status Dry;Intact;Old drainage 01/05/2016 12:00 AM  Dressing Intervention New dressing 12/30/2015 10:00 PM  Site Assessment Other (Comment) 01/02/2016  8:00 AM  Surrounding Skin Unable to view 01/05/2016 12:00 AM  Output (mL) 0 mL 01/04/2016  8:00 PM     NG/OG Tube Orogastric 16 Fr. Right mouth (Active)  Site Assessment Clean;Intact 01/05/2016 12:00 AM  Ongoing Placement Verification Auscultation 01/05/2016 12:00 AM  Status Infusing tube feed 01/05/2016 12:00 AM  Intake (mL) 90 mL 01/02/2016  9:38 AM  Output (mL) 160 mL 01/01/2016  5:00 AM     Urethral Catheter Darla Anastasia Fiedler RN  Non-latex 16 Fr. (Active)  Indication for Insertion or Continuance of Catheter Aggressive IV diuresis 01/05/2016 12:00 AM  Site Assessment Clean;Intact 01/05/2016 12:00 AM  Catheter Maintenance Bag below level of bladder;Catheter secured;Drainage bag/tubing not touching floor;Insertion date on drainage bag;No dependent loops;Seal intact 01/05/2016 12:00 AM  Collection Container Standard drainage bag 01/05/2016 12:00 AM  Securement Method Securing device (Describe) 01/05/2016 12:00 AM  Urinary Catheter Interventions Unclamped 01/05/2016 12:00 AM  Output (mL) 100 mL 01/05/2016  6:16 AM    Microbiology/Sepsis markers: Results for orders placed or performed during the hospital encounter of 12/28/15  MRSA PCR Screening     Status: None   Collection Time: 12/29/15  5:06 AM  Result Value Ref Range Status   MRSA by PCR NEGATIVE NEGATIVE Final    Comment:        The GeneXpert MRSA Assay (FDA approved for NASAL specimens only), is one component of a comprehensive MRSA colonization surveillance program. It is not intended to diagnose MRSA infection nor to guide or monitor treatment for MRSA infections.   Culture, respiratory (NON-Expectorated)     Status: None (Preliminary result)   Collection Time: 01/03/16 11:55 AM  Result Value Ref Range Status   Specimen Description TRACHEAL ASPIRATE  Final   Special Requests Normal  Final   Gram Stain   Final    ABUNDANT WBC PRESENT, PREDOMINANTLY PMN NO SQUAMOUS EPITHELIAL CELLS SEEN ABUNDANT GRAM NEGATIVE COCCOBACILLI FEW GRAM POSITIVE RODS FEW GRAM POSITIVE COCCI IN PAIRS  RARE GRAM NEGATIVE RODS    Culture CULTURE REINCUBATED FOR BETTER GROWTH  Final   Report Status PENDING  Incomplete    Anti-infectives:  Anti-infectives    Start     Dose/Rate Route Frequency Ordered Stop   01/04/16 1000  piperacillin-tazobactam (ZOSYN) IVPB 3.375 g     3.375 g 12.5 mL/hr over 240 Minutes Intravenous Every 8 hours 01/04/16 0841        Best  Practice/Protocols:  VTE Prophylaxis: Lovenox (prophylaxtic dose) and Mechanical GI Prophylaxis: Proton Pump Inhibitor Continous Sedation Only fentanyl at 16mcg/hr  Consults: Treatment Team:  Alt[place], MD [facility], MD    Events:  Subjective:    Overnight Issues: No specific issues overnight.  Objective:  Vital signs for last 24 hours: Temp:  [98.1 F (36.7 C)-101.5 F (38.6 C)] 98.1 F (36.7 C) (08/18 0400) Pulse Rate:  [56-141] 115 (08/18 0712) Resp:  [18-31] 21 (08/18 0712) BP: (93-125)/(62-88) 107/81 (08/18 0712) SpO2:  [94 %-100 %] 99 % (08/18 0712) FiO2 (%):  [40 %] 40 % (08/18 0712) Weight:  [81.3 kg (179 lb 3.7 oz)] 81.3 kg (179 lb 3.7 oz) (08/18 0200)  Hemodynamic parameters for last 24 hours:    Intake/Output from previous day: 08/17 0701 - 08/18 0700 In: 3734.8 [I.V.:1974.8; NG/GT:1560; IV Piggyback:200] Out: 1465 [Urine:1465]  Intake/Output this shift: No intake/output data recorded.  Vent settings for last 24 hours: Vent Mode: PRVC FiO2 (%):  [40 %] 40 % Set Rate:  [18 bmp] 18 bmp Vt Set:  [540 mL] 540 mL PEEP:  [5 cmH20] 5 cmH20 Plateau Pressure:  [13 cmH20-22 cmH20] 18 cmH20  Physical Exam:  General: alert, no respiratory distress and not following commands. Neuro: nonfocal exam, confused, RASS 0 and agitated Resp: rhonchi RML and RUL and no air leak, CXR shows improvement of the lright lung field CVS: IRR and AF with RVR and wide complex GI: soft, nontender, BS WNL, no r/g and tolerating tube feedings well. Extremities: no edema, no erythema, pulses WNL and no changes  Results for orders placed or performed during the hospital encounter of 12/28/15 (from the past 24 hour(s))  Glucose, capillary     Status: Abnormal   Collection Time: 01/04/16  8:20 AM  Result Value Ref Range   Glucose-Capillary 168 (H) 65 - 99 mg/dL   Comment 1 Notify RN    Comment 2 Document in Chart   I-STAT 3, arterial blood gas (G3+)     Status:  Abnormal   Collection Time: 01/04/16  9:08 AM  Result Value Ref Range   pH, Arterial 7.424 7.350 - 7.450   pCO2 arterial 41.6 35.0 - 45.0 mmHg   pO2, Arterial 86.0 80.0 - 100.0 mmHg   Bicarbonate 26.8 (H) 20.0 - 24.0 mEq/L   TCO2 28 0 - 100 mmol/L   O2 Saturation 96.0 %   Acid-Base Excess 3.0 (H) 0.0 - 2.0 mmol/L   Patient temperature 101.5 F    Collection site FEMORAL ARTERY    Drawn by Operator    Sample type ARTERIAL   Glucose, capillary     Status: Abnormal   Collection Time: 01/04/16 11:03 AM  Result Value Ref Range   Glucose-Capillary 234 (H) 65 - 99 mg/dL  Glucose, capillary     Status: Abnormal   Collection Time: 01/04/16  3:30 PM  Result Value Ref Range   Glucose-Capillary 166 (H) 65 - 99 mg/dL   Comment 1 Notify RN    Comment 2 Document in Chart  Glucose, capillary     Status: Abnormal   Collection Time: 01/04/16  9:30 PM  Result Value Ref Range   Glucose-Capillary 160 (H) 65 - 99 mg/dL  Glucose, capillary     Status: Abnormal   Collection Time: 01/04/16 11:59 PM  Result Value Ref Range   Glucose-Capillary 205 (H) 65 - 99 mg/dL  Glucose, capillary     Status: Abnormal   Collection Time: 01/05/16  3:17 AM  Result Value Ref Range   Glucose-Capillary 229 (H) 65 - 99 mg/dL     Assessment/Plan:   NEURO  Altered Mental Status:  agitation, delirium, pain, sedation and no longer obtunded   Plan: Wean sedation as appropriate.  Weaning on the ventilator currently  PULM  Atelectasis/collapse (focal and RML and RLL) Chest Wall Trauma rib fractures and Pneumothorax (traumatic)   Plan: Chest tube on water seal and no drop of the lung  CARDIO  Atrial Fibrillation (with rapid ventricular response and wide complex)   Plan: Cardiology continues to be involved and we appreciate their support and care  RENAL  Hypervolemia   Plan: Renal function has been good, but significantly positive on fluids over the last 24 hours.  Will see what can be adjusted.  Continue togive Lasix  daily  GI  No specific issues.   Plan: Tolerating tube feedings.  ID  Pneumonia (hospital acquired (not ventilator-associated) treating empirically for PNA with Zosyn.  Cultures are still pending.)   Plan: Continue empiric antibiotics  HEME  Anemia anemia of critical illness)   Plan: Mild and does not require blood transfusion  ENDO No significant issues.   Plan: No changes, CPM  Global Issues  Patient weaning appropriately, and acutally ABG from yesterday was not bad at all.  He  Is starting to awaken more and thrashing about somewhat.  Still very wary of considering extubation, although if the family would like to go that route prior to trach next week we could consider that.  Still has a lot of secretions.    LOS: 7 days   Additional comments:I reviewed the patients new imaging test results. CXR and Labs are pending  Critical Care Total Time*: 30 Minutes  Faizaan Mahalia Dykes 01/05/2016  *Care during the described time interval was provided by me and/or other providers on the critical care team.  I have reviewed this patient's available data, including medical history, events of note, physical examination and test results as part of my evaluation.

## 2016-01-06 ENCOUNTER — Inpatient Hospital Stay (HOSPITAL_COMMUNITY): Payer: Medicare Other

## 2016-01-06 DIAGNOSIS — I739 Peripheral vascular disease, unspecified: Secondary | ICD-10-CM

## 2016-01-06 DIAGNOSIS — I429 Cardiomyopathy, unspecified: Secondary | ICD-10-CM

## 2016-01-06 DIAGNOSIS — R0989 Other specified symptoms and signs involving the circulatory and respiratory systems: Secondary | ICD-10-CM

## 2016-01-06 DIAGNOSIS — I5023 Acute on chronic systolic (congestive) heart failure: Secondary | ICD-10-CM

## 2016-01-06 DIAGNOSIS — I998 Other disorder of circulatory system: Secondary | ICD-10-CM

## 2016-01-06 LAB — VAS US LOWER EXTREMITY ARTERIAL DUPLEX
LPOPPPSV: 58 cm/s
LPTIBDISTSYS: 8 cm/s
LSFDPSV: -22 cm/s
LSFMPSV: -40 cm/s
Left ant tibial distal sys: 10 cm/s
Left post tibial sys PSV: 19 cm/s
Left super femoral prox sys PSV: 62 cm/s
left post tibial mid sys: 12 cm/s

## 2016-01-06 LAB — GLUCOSE, CAPILLARY
GLUCOSE-CAPILLARY: 112 mg/dL — AB (ref 65–99)
GLUCOSE-CAPILLARY: 128 mg/dL — AB (ref 65–99)
GLUCOSE-CAPILLARY: 135 mg/dL — AB (ref 65–99)
GLUCOSE-CAPILLARY: 159 mg/dL — AB (ref 65–99)
Glucose-Capillary: 173 mg/dL — ABNORMAL HIGH (ref 65–99)
Glucose-Capillary: 180 mg/dL — ABNORMAL HIGH (ref 65–99)
Glucose-Capillary: 204 mg/dL — ABNORMAL HIGH (ref 65–99)

## 2016-01-06 LAB — BASIC METABOLIC PANEL
ANION GAP: 13 (ref 5–15)
BUN: 63 mg/dL — ABNORMAL HIGH (ref 6–20)
CALCIUM: 8.5 mg/dL — AB (ref 8.9–10.3)
CHLORIDE: 100 mmol/L — AB (ref 101–111)
CO2: 28 mmol/L (ref 22–32)
Creatinine, Ser: 1.32 mg/dL — ABNORMAL HIGH (ref 0.61–1.24)
GFR calc non Af Amer: 50 mL/min — ABNORMAL LOW (ref >60)
GFR, EST AFRICAN AMERICAN: 58 mL/min — AB (ref >60)
GLUCOSE: 165 mg/dL — AB (ref 65–99)
Potassium: 3.7 mmol/L (ref 3.5–5.1)
Sodium: 141 mmol/L (ref 135–145)

## 2016-01-06 LAB — CBC WITH DIFFERENTIAL/PLATELET
BASOS ABS: 0 10*3/uL (ref 0.0–0.1)
Basophils Relative: 0 %
Eosinophils Absolute: 0 10*3/uL (ref 0.0–0.7)
Eosinophils Relative: 0 %
HEMATOCRIT: 35.4 % — AB (ref 39.0–52.0)
HEMOGLOBIN: 11.3 g/dL — AB (ref 13.0–17.0)
LYMPHS PCT: 19 %
Lymphs Abs: 3.5 10*3/uL (ref 0.7–4.0)
MCH: 33 pg (ref 26.0–34.0)
MCHC: 31.9 g/dL (ref 30.0–36.0)
MCV: 103.5 fL — AB (ref 78.0–100.0)
MONO ABS: 1.4 10*3/uL — AB (ref 0.1–1.0)
MONOS PCT: 8 %
NEUTROS ABS: 13.2 10*3/uL — AB (ref 1.7–7.7)
NEUTROS PCT: 73 %
Platelets: 211 10*3/uL (ref 150–400)
RBC: 3.42 MIL/uL — ABNORMAL LOW (ref 4.22–5.81)
RDW: 14.7 % (ref 11.5–15.5)
WBC: 18.1 10*3/uL — AB (ref 4.0–10.5)

## 2016-01-06 LAB — CULTURE, RESPIRATORY: SPECIAL REQUESTS: NORMAL

## 2016-01-06 LAB — CULTURE, RESPIRATORY W GRAM STAIN

## 2016-01-06 MED ORDER — POTASSIUM CHLORIDE IN NACL 20-0.9 MEQ/L-% IV SOLN
INTRAVENOUS | Status: DC
  Administered 2016-01-06: 10:00:00 via INTRAVENOUS
  Administered 2016-01-07: 1000 mL via INTRAVENOUS
  Administered 2016-01-07: 23:00:00 via INTRAVENOUS
  Administered 2016-01-08: 50 mL/h via INTRAVENOUS
  Filled 2016-01-06 (×5): qty 1000

## 2016-01-06 NOTE — Progress Notes (Signed)
Cardiologist: Dr. Harrington Challenger Subjective:  Sedate intubated. Moving around  Objective:  Vital Signs in the last 24 hours: Temp:  [99 F (37.2 C)-103.4 F (39.7 C)] 103 F (39.4 C) (08/19 0947) Pulse Rate:  [50-149] 149 (08/19 1000) Resp:  [13-32] 25 (08/19 1000) BP: (92-124)/(65-93) 100/75 (08/19 1000) SpO2:  [98 %-100 %] 99 % (08/19 1115) FiO2 (%):  [40 %] 40 % (08/19 1115) Weight:  [176 lb 5.9 oz (80 kg)] 176 lb 5.9 oz (80 kg) (08/19 0200)  Intake/Output from previous day: 08/18 0701 - 08/19 0700 In: 4336.9 [I.V.:2551.9; VW/PV:9480; IV Piggyback:200] Out: 1655 [Urine:1541]   Physical Exam: General: Ill appearing on vent. Head:  Normocephalic and atraumatic. Lungs: Clear to auscultation and percussion.Vent noise Heart: Irreg irreg tachy.  No murmur, rubs or gallops.  Abdomen: soft, non-tender, positive bowel sounds. Extremities: Left LE mottling noted. Neurologic: Sedate    Lab Results:  Recent Labs  01/05/16 0828 01/06/16 0500  WBC 16.9* 18.1*  HGB 11.8* 11.3*  PLT 204 211    Recent Labs  01/05/16 0828 01/06/16 0500  NA 137 141  K 3.9 3.7  CL 99* 100*  CO2 30 28  GLUCOSE 147* 165*  BUN 42* 63*  CREATININE 1.01 1.32*    Imaging: Dg Chest Port 1 View  Result Date: 01/05/2016 CLINICAL DATA:  Pneumonia and intubation. EXAM: PORTABLE CHEST 1 VIEW COMPARISON:  01/04/2016 FINDINGS: Endotracheal tube is appropriately positioned above the carina. PICC line tip in the SVC region. Stable position of the left chest tube. Large amount of subcutaneous gas in the left chest. No evidence for a pneumothorax. Heart size remains mildly enlarged. Patchy densities in the right lower lung. Linear density at the left lung base could represent atelectasis. Aortic atherosclerotic calcifications. Nasogastric tube is coiled in the stomach. IMPRESSION: Stable position of the left chest tube without a definite pneumothorax. Persistent patchy densities at the right lung base. Findings  could represent airspace disease or atelectasis. Support apparatuses as described. Electronically Signed   By: Markus Daft M.D.   On: 01/05/2016 07:23   Personally viewed.   Telemetry: AFIB RVR 110-120 Personally viewed.   EKG:  AFIB RVR RBBB Personally viewed.  Cardiac Studies:  ECHO 01/01/16 - Left ventricle: The cavity size was mildly dilated. Wall   thickness was normal. Systolic function was severely reduced. The   estimated ejection fraction was in the range of 25% to 30%.   Diffuse hypokinesis. Indeterminant diastolic function. - Aortic valve: There was no stenosis. - Mitral valve: Mildly calcified annulus. There was no significant   regurgitation. - Left atrium: The atrium was mildly dilated. - Right ventricle: The cavity size was mildly dilated. Systolic   function was mildly reduced. - Right atrium: The atrium was mildly dilated. - Pulmonary arteries: No complete TR doppler jet so unable to   estimate PA systolic pressure. - Systemic veins: IVC measured 2.0 cm with < 50% respirophasic   variation suggesting RA pressure 8 mmHg. .  Impressions:  - Technically difficult study with poor acoustic windows. Mildly   dilated LV with EF 25-30%. Diffuse hypokinesis. Mildly dilated RV   with mildly decreased systolic function. No significant valvular   abnormalities.  Meds: Scheduled Meds: . antiseptic oral rinse  7 mL Mouth Rinse 10 times per day  . atropine  0.4 mg Intravenous Once  . chlorhexidine gluconate (SAGE KIT)  15 mL Mouth Rinse BID  . enoxaparin (LOVENOX) injection  40 mg Subcutaneous Q24H  . famotidine (  PEPCID) IV  20 mg Intravenous Q24H  . folic acid  1 mg Oral Daily  . insulin aspart  0-15 Units Subcutaneous Q4H  . ipratropium  0.5 mg Nebulization Q4H  . levalbuterol  0.63 mg Nebulization Q4H  . metoprolol tartrate  12.5 mg Oral Q6H  . multivitamin with minerals  1 tablet Oral Daily  . piperacillin-tazobactam (ZOSYN)  IV  3.375 g Intravenous Q8H  .  sertraline  50 mg Oral Daily  . sodium chloride flush  10-40 mL Intracatheter Q12H  . thiamine  100 mg Oral Daily   Or  . thiamine  100 mg Intravenous Daily   Continuous Infusions: . 0.9 % NaCl with KCl 20 mEq / L 50 mL/hr at 01/06/16 0932  . amiodarone 60 mg/hr (01/06/16 0932)  . DOPamine 0 mcg/kg/min (01/01/16 1911)  . feeding supplement (VITAL AF 1.2 CAL) 1,000 mL (01/06/16 0224)  . fentaNYL infusion INTRAVENOUS 75 mcg/hr (01/06/16 0636)   PRN Meds:.Place/Maintain arterial line **AND** sodium chloride, acetaminophen, ALPRAZolam, artificial tears, bisacodyl, HYDROmorphone (DILAUDID) injection, ondansetron **OR** ondansetron (ZOFRAN) IV, sodium chloride flush  Assessment/Plan:  Active Problems:   Cardiomyopathy ([place])   Traumatic fracture of ribs with pneumothorax   Multiple rib fractures   Respiratory failure ([facility])   Acute on chronic systolic CHF (congestive heart failure) (H[facility])   NSVT (nonsustained ventricular tachycardia) ([place])  80 year old with trauma, multiple fractures, acute on chronic systolic HF, AFIB with RVR, Left lower extremity possible ischemia.   AFIB  - AMIO drip continues. Reasonable control 110bpm currently but still occasional increase in HR.  No changes  - Trying to use metoprolol 12.5 for further control as blood pressure allows.   - Not felt to be anticoagulation candidate given trauma.   RBBB  - noted on ECG  Acute on chronic systolic HF  - declined ICD in the past.   - previously given lasix. Now off (BUN creat increased - 63/1.32)  - EF 30%  LE has been evaluated by Dr. Bridgett Larsson. Await vascular studies.   Will continue to follow   Candee Furbish 01/06/2016, 11:39 AM

## 2016-01-06 NOTE — Progress Notes (Signed)
Notified Trauma MD when unable to get left pedal pulse, or left posterior tibial pulse.  MD indicated that he was in the OR and to call CCM to get a vasculature consult.  Notified CCM and will consult vasculature.  Will continue to monitor patient.

## 2016-01-06 NOTE — Progress Notes (Signed)
VASCULAR LAB PRELIMINARY  ARTERIAL  ABI completed:    RIGHT    LEFT    PRESSURE WAVEFORM  PRESSURE WAVEFORM  BRACHIAL 120 Triphasic BRACHIAL Bandages   DP 84 Monophasic DP Absent      AT 9 Barely audible  PT 79 Monophasic PT absent   PER   PER absent     RIGHT LEFT  ABI 0.70 Unable to to obtain barely audible   Right - ABI indicates a moderate reduction in arterial flow. Left - Unable to obtain an accurate ABI due to near occlusion.  Kota Ciancio, RVS 01/06/2016, 3:44 PM

## 2016-01-06 NOTE — Progress Notes (Signed)
   Daily Progress Note  I have discussed the findings with the family including a daughter and the patient's spouse.  The wife notes that this patient has sx consistent with rest pain prior to this admission, which again emphasizes the chronicity of this patient's PAD.  - The family agrees at this point to maximal medical mgmt - Only additional consideration would be anticoagulation but with the post-trauma status and risk of bleeding this may be a contraindication - The family are going to discuss whether the patient would consider amputation in the event either foot dies  Adele Barthel, MD [facility]: [phone] Pager: [phone]  01/06/2016, 6:32 PM

## 2016-01-06 NOTE — Consult Note (Addendum)
Referred by:  Trauma Service [facility] (Dr. Georgette Dover)  Reason for referral: left leg ischemia  History of Present Illness  Chad Rogers is a 80 y.o. ([birth year]) male who presents with chief complaint: fall with chest pain.  History is obtained from EMR as patient is intubated and sedated.  Onset of symptoms occurred 12/28/15.  Pt reported fell down stairs and fractured ribs leading to PTX and also fx his pelvis.  Reported this AM, the ICU nurse noted color change in L leg with increased mottling and coolness to to lower leg.  Reportedly this patient has been in afib without any anticoagulation.  Currently only on Amio gtt.  Dopamine gtt was stopped on 12/31/15.   Past Medical History:  Diagnosis Date  . Alcohol abuse    family reports that [pt will drink at least 6 beers or more a day,   . Allergic rhinitis   . Cardiomyopathy [id number]   Acute systolic CHF at presentation; 05/2009 cardiac cath- 06/01/09  non obst coronary artery disease with EF 25%  . CHF (congestive heart failure) ([place])   . COPD (chronic obstructive pulmonary disease) ([place])   . Coronary artery disease   . Hypertension   . Osteoarthritis   . Psoriasis   . Pulmonary fibrosis ([place])    12/2004 with basilar fibrotic changes;  PFT's 06/07/09 FEV1 1.98 (68%) raio 53 no better after B2,  DLC0105%  . Skin cancer   . Tobacco abuse    100 pack years; quit    Past Surgical History:  Procedure Laterality Date  . HAND SURGERY    . INGUINAL HERNIA REPAIR    . ROTATOR CUFF REPAIR      Social History   Social History  . Marital status: Married    Spouse name: N/A  . Number of children: N/A  . Years of education: N/A   Occupational History  . Driver     [place] History Main Topics  . Smoking status: Current Every Day Smoker    Packs/day: 2.00    Years: 50.00    Types: Cigarettes  . Smokeless tobacco: Former Systems developer  . Alcohol use 17.5 oz/week    35 Standard drinks or equivalent per week   Comment: at least 6 beers or more a day,   . Drug use: Unknown  . Sexual activity: Not on file   Other Topics Concern  . Not on file   Social History Narrative  . No narrative on file    Family History  Problem Relation Age of Onset  . Cancer Mother 24    gynecologic  . Aneurysm Father 93    cerebral aneurysm  . Asthma Maternal Grandmother     Current Facility-Administered Medications  Medication Dose Route Frequency Provider Last Rate Last Dose  . 0.9 %  sodium chloride infusion   Intra-arterial PRN Grace Bushy Minor, NP      . 0.9 % NaCl with KCl 20 mEq/ L  infusion   Intravenous Continuous Lisette Abu, PA-C 50 mL/hr at 01/06/16 0932    . acetaminophen (TYLENOL) tablet 650 mg  650 mg Oral Q4H PRN Georganna Skeans, MD   650 mg at 01/06/16 0815  . ALPRAZolam Duanne Moron) tablet 0.5 mg  0.5 mg Oral QHS PRN Georganna Skeans, MD   0.5 mg at 01/04/16 2137  . amiodarone (NEXTERONE PREMIX) 360-4.14 MG/200ML-% (1.8 mg/mL) IV infusion  60 mg/hr Intravenous Continuous Fay Records, MD 33.3 mL/hr at 01/06/16 [id number]  60 mg/hr at 01/06/16 0932  . antiseptic oral rinse solution (CORINZ)  7 mL Mouth Rinse 10 times per day Georganna Skeans, MD   7 mL at 01/06/16 0504  . artificial tears (LACRILUBE) ophthalmic ointment   Left Eye Q4H PRN Judeth Horn, MD   1 application at 32/12/24 1113  . atropine injection 0.4 mg  0.4 mg Intravenous Once Erroll Luna, MD      . bisacodyl (DULCOLAX) suppository 10 mg  10 mg Rectal Daily PRN Georganna Skeans, MD   10 mg at 01/06/16 0902  . chlorhexidine gluconate (SAGE KIT) (PERIDEX) 0.12 % solution 15 mL  15 mL Mouth Rinse BID Georganna Skeans, MD   15 mL at 01/06/16 0800  . DOPamine (INTROPIN) 800 mg in dextrose 5 % 250 mL (3.2 mg/mL) infusion  0-20 mcg/kg/min Intravenous Titrated Luz Brazen, MD 0 mL/hr at 01/01/16 1911 0 mcg/kg/min at 01/01/16 1911  . enoxaparin (LOVENOX) injection 40 mg  40 mg Subcutaneous Q24H Georganna Skeans, MD   40 mg at 01/06/16 1026  . famotidine  (PEPCID) IVPB 20 mg premix  20 mg Intravenous Q24H Mauri Brooklyn, MD   20 mg at 01/06/16 1021  . feeding supplement (VITAL AF 1.2 CAL) liquid 1,000 mL  1,000 mL Per Tube Continuous Judeth Horn, MD 65 mL/hr at 01/06/16 0224 1,000 mL at 01/06/16 0224  . fentaNYL (SUBLIMAZE) 2,500 mcg in sodium chloride 0.9 % 250 mL (10 mcg/mL) infusion  50-200 mcg/hr Intravenous Continuous Mauri Brooklyn, MD 7.5 mL/hr at 01/06/16 0636 75 mcg/hr at 01/06/16 0636  . folic acid (FOLVITE) tablet 1 mg  1 mg Oral Daily Georganna Skeans, MD   1 mg at 01/06/16 1028  . HYDROmorphone (DILAUDID) injection 0.5-1 mg  0.5-1 mg Intravenous Q4H PRN Georganna Skeans, MD   1 mg at 12/30/15 2224  . insulin aspart (novoLOG) injection 0-15 Units  0-15 Units Subcutaneous Q4H Lisette Abu, PA-C   2 Units at 01/06/16 0815  . ipratropium (ATROVENT) nebulizer solution 0.5 mg  0.5 mg Nebulization Q4H Altamese Olivet, MD   0.5 mg at 01/06/16 0734  . levalbuterol (XOPENEX) nebulizer solution 0.63 mg  0.63 mg Nebulization Q4H Altamese [place]ender [place], MD   0.63 mg at 01/06/16 0734  . metoprolol tartrate (LOPRESSOR) 25 mg/10 mL oral suspension 12.5 mg  12.5 mg Oral Q6H Fay Records, MD   12.5 mg at 01/06/16 1026  . multivitamin with minerals tablet 1 tablet  1 tablet Oral Daily Georganna Skeans, MD   1 tablet at 01/06/16 1027  . ondansetron (ZOFRAN) tablet 4 mg  4 mg Oral Q6H PRN Georganna Skeans, MD       Or  . ondansetron [facility]) injection 4 mg  4 mg Intravenous Q6H PRN Georganna Skeans, MD      . piperacillin-tazobactam (ZOSYN) IVPB 3.375 g  3.375 g Intravenous Q8H Judeth Horn, MD   3.375 g at 01/06/16 1021  . sertraline (ZOLOFT) tablet 50 mg  50 mg Oral Daily Georganna Skeans, MD   50 mg at 01/06/16 1027  . sodium chloride flush (NS) 0.9 % injection 10-40 mL  10-40 mL Intracatheter Q12H Judeth Horn, MD   10 mL at 01/05/16 2152  . sodium chloride flush (NS) 0.9 % injection 10-40 mL  10-40 mL Intracatheter PRN Judeth Horn, MD   10 mL at 12/31/15 2240  . thiamine  (VITAMIN B-1) tablet 100 mg  100 mg Oral Daily Georganna Skeans, MD   100 mg at 01/06/16 1027   Or  .  thiamine (B-1) injection 100 mg  100 mg Intravenous Daily Georganna Skeans, MD   100 mg at 01/06/16 1025    No Known Allergies   REVIEW OF SYSTEMS:  (Positives checked otherwise negative)  Cannot be obtained   For VQI Use Only  PRE-ADM LIVING: Home  AMB STATUS: Ambulatory  CAD Sx: Stable Angina  PRIOR CHF: Moderate  STRESS TEST: [x]  No, [ ]  Normal, [ ]  + ischemia, [ ]  + MI, [ ]  Both   Physical Examination  Vitals:   01/06/16 0800 01/06/16 0900 01/06/16 0947 01/06/16 1000  BP: 93/79 124/78  100/75  Pulse: (!) 139 (!) 106  (!) 149  Resp: (!) 22 (!) 31  (!) 25  Temp: (!) 102.2 F (39 C) (!) 103.4 F (39.7 C) (!) 103 F (39.4 C)   TempSrc: Axillary Oral Oral   SpO2: 99% 98%  99%  Weight:      Height:       Body mass index is 25.31 kg/m.  General: intubated, sedated  Head: /AT  Ear/Nose/Throat: no nasopharyngeal drainage, intubated so cannot examine oropharynx or test hearing  Eyes: unable to test due to sedation  Neck: No nuchal rigidity, no palpable LAD  Pulmonary: mech vent sound, BLL rales and occasional rhonchi, L CT in place  Cardiac: irregularly, irregular, Nl S1, S2, no Murmurs, rubs or gallops  Vascular: Vessel Right Left  Radial Not palpable Not Palpable  Ulnar Not Palpable Not Palpable  Brachial Faintly Palpable Faintly Palpable  Carotid Palpable, without bruit Palpable, without bruit  Aorta Not palpable N/A  Femoral Palpable Palpable  Popliteal Not palpable Not palpable  PT Not Palpable Not Palpable  DP Not Palpable Not Palpable   Gastrointestinal: soft, no grimace to palpation, ND, -G/R,   Musculoskeletal: no motor exam due to sedation, R leg appears somewhat mottled and cyanotic from knee down, L leg is markedly more mottled from distal thigh down to foot, R leg warm throughout, L leg cool from knee down  Neurologic: no exam possible  due to sedation  Psychiatric: no exam possible due to sedation  Dermatologic: See M/S exam for extremity exam, no rashes otherwise noted  Lymph : No Cervical, Axillary, or Inguinal lymphadenopathy    Laboratory: CBC:    Component Value Date/Time   WBC 18.1 (H) 01/06/2016 0500   RBC 3.42 (L) 01/06/2016 0500   HGB 11.3 (L) 01/06/2016 0500   HCT 35.4 (L) 01/06/2016 0500   PLT 211 01/06/2016 0500   MCV 103.5 (H) 01/06/2016 0500   MCH 33.0 01/06/2016 0500   MCHC 31.9 01/06/2016 0500   RDW 14.7 01/06/2016 0500   LYMPHSABS 3.5 01/06/2016 0500   MONOABS 1.4 (H) 01/06/2016 0500   EOSABS 0.0 01/06/2016 0500   BASOSABS 0.0 01/06/2016 0500    BMP:    Component Value Date/Time   NA 141 01/06/2016 0500   K 3.7 01/06/2016 0500   CL 100 (L) 01/06/2016 0500   CO2 28 01/06/2016 0500   GLUCOSE 165 (H) 01/06/2016 0500   BUN 63 (H) 01/06/2016 0500   CREATININE 1.32 (H) 01/06/2016 0500   CREATININE 0.82 03/15/2013 1226   CALCIUM 8.5 (L) 01/06/2016 0500   GFRNONAA 50 (L) 01/06/2016 0500   GFRAA 58 (L) 01/06/2016 0500    Coagulation: Lab Results  Component Value Date   INR 1.0 ratio 05/25/2009   No results found for: PTT  Lipids:    Component Value Date/Time   CHOL 152 09/05/2011 1125   TRIG 38  12/30/2015 2203   HDL 52 09/05/2011 1125   CHOLHDL 2.9 09/05/2011 1125   VLDL 14 09/05/2011 1125   LDLCALC 86 09/05/2011 1125    Radiology: Dg Chest Port 1 View  Result Date: 01/05/2016 CLINICAL DATA:  Pneumonia and intubation. EXAM: PORTABLE CHEST 1 VIEW COMPARISON:  01/04/2016 FINDINGS: Endotracheal tube is appropriately positioned above the carina. PICC line tip in the SVC region. Stable position of the left chest tube. Large amount of subcutaneous gas in the left chest. No evidence for a pneumothorax. Heart size remains mildly enlarged. Patchy densities in the right lower lung. Linear density at the left lung base could represent atelectasis. Aortic atherosclerotic calcifications.  Nasogastric tube is coiled in the stomach. IMPRESSION: Stable position of the left chest tube without a definite pneumothorax. Persistent patchy densities at the right lung base. Findings could represent airspace disease or atelectasis. Support apparatuses as described. Electronically Signed   By: Markus Daft M.D.   On: 01/05/2016 07:23   CT abd/pelvis w/ contrast (12/29/15): Acute fractures of the left third through twelfth ribs with small left pneumothorax and left subcutaneous emphysema.  Acute nondisplaced fractures of the anterior left acetabulum and right L3 and L4 transverse processes.  No evidence of solid or hollow visceral injury.  Cardiomegaly and coronary artery disease.  Hepatic steatosis.  Abdominal aortic atherosclerosis.  Based on my review of his CT abd/pelvis, this patient has extensive calcific atherosclerosis throughout his aortoiliac iliac segment extending into his common femoral arteries bilaterally   Medical Decision Making  ALYAN HARTLINE is a 81 y.o. male who presents with: likely acute on chronic peripheral arterial disease,  Chronic systolic CHF, afib w/ RVR   Unfortunately distal pulse exam not well documented.  I doubt this patient baseline has pulses given the severity of aortoiliac calcific atherosclerosis seen in his CT and poor EF (as low at 15-20% previously).  He likely has baseline chronic PAD in both leg as evident by the lack of pulses in his warm right foot also.  Of note, mottling was present in BOTH legs, suggesting that cardiac output may be contributing to this patient's L leg ischemia.  Stat BLE and LLE arterial duplex ordered.  Unclear timeframe of change of perfusion in left leg though the documentation seems to suggest <6 hours since onset of changes.  Given his poor cardiac status, he is not a candidate for a extensive revascularization procedure.  If he has an thromboembolism likely due to afib, would attempt a thrombectomy,  preferably without fasciotomies.  If muscle is already dead upon exploration, might have to consider amputation later on.  Further recommendations to follow non-invasive studies.  Thank you for allowing [place] to participate in this patient's care.   Adele Barthel, MD [facility]: [phone] Pager: [phone]  01/06/2016, 11:06 AM  Addendum  Preliminary LLE arterial duplex demonstrates triphasic flow in CFA, PFA, SFA without thrombus.  Flow in tibials present but waveforms reflect significant atherosclerotic disease.  ABI pending.  Suspect will demonstrate similar tibial disease in R leg.  - further recommendation pending study completion - in the event this patient's findings reflect chronic tibial artery disease rather than thomboembolic disease, no acute intervention would be possible given his compromised medical and cardiac status  Adele Barthel, MD [facility] [place]: [phone] Pager: [phone]  01/06/2016, 2:20 PM   Addendum  BLE ABI   RIGHT   LEFT    PRESSURE WAVEFORM  PRESSURE WAVEFORM  BRACHIAL 120 Triphasic BRACHIAL Bandages   DP 84 Monophasic DP Absent      AT 9 Barely audible  PT 79 Monophasic PT absent   PER   PER absent     RIGHT LEFT  ABI 0.70 Unable to to obtain barely audible    Waveforms in R leg are consistent with severe disease.  Waveforms in L are consistent with critical disease.  L leg arterial duplex Preliminary report:  Duplex scan of the left lower extremity revealed no evidence of an arterial thrombus. Moderate calcified and heterogeneous plaque was noted throughout the thigh and proximal popliteal arteries with triphasic Doppler waveforms. Moderate to severe calcific plaque was noted in the lower leg worsening moving distally with near absence of flow and severely diminished monohaasic Doppler waveforms. The right Common femoral artery was  monophasic. Also an incidental finding was a baker's cyst in the left popliteal fossa.  - Flow evident on L arterial duplex with waveforms in AT and PTA - ABI emphasize that this flow is inadequate - On exam, mottling has resolved in both legs without any interventions suggesting his mottling was likely a function of his depressed cardiac output - Essentially this patient has baseline SEVERE PAD that has been exacerbated by poor CARDIAC OUTPUT - No thrombus is evident on the duplex, so I doubt thrombectomy is likely to improve flow - I doubt this patient would survive a fem-tib bypass in his current medical state, so at this point, I focus on maximal medical mgmt - I discussed this with the patient's family.  They are aware he is at risk for limb loss.  Adele Barthel, MD [facility] [place] Office: [phone] Pager: [phone]  01/06/2016, 5:26 PM

## 2016-01-06 NOTE — Progress Notes (Signed)
VASCULAR LAB PRELIMINARY  PRELIMINARY  PRELIMINARY  PRELIMINARY  Arterial duplex of the left lower extremity completed.    Preliminary report:  Duplex scan of the left lower extremity revealed no evidence of an arterial thrombus. Moderate calcified and heterogeneous plaque was noted throughout the thigh and proximal popliteal arteries with triphasic Doppler waveforms. Moderate to severe calcific plaque was noted in the lower leg worsening moving distally with near absence of flow and severely diminished monohaasic Doppler waveforms. The right Common femoral artery was monophasic. Also an incidental finding was a baker's cyst in the left popliteal fossa.  Oather Muilenburg, RVS 01/06/2016, 2:29 PM

## 2016-01-06 NOTE — Progress Notes (Signed)
Called to check ETA of vascular tech for duplex doppler.  Will call tech again and indicate test is STAT. Will follow up with tech in 30 minutes.  Will continue to monitor patient.

## 2016-01-06 NOTE — Progress Notes (Signed)
Brief Progress Note -   Called by Trauma to assist with this pt who was previously seen by PCCM for respiratory failure r/t traumatic ptx.  RN called trauma this am to report inability to doppler pulses in L foot.      Recent Labs Lab 01/04/16 0430 01/05/16 0828 01/06/16 0500  HGB 11.5* 11.8* 11.3*  HCT 34.5* 34.2* 35.4*  WBC 12.0* 16.9* 18.1*  PLT 183 204 211    Recent Labs Lab 12/31/15 1200  12/31/15 1940  01/01/16 0340 01/01/16 1712  01/02/16 1639 01/03/16 0410 01/04/16 0430 01/05/16 0828 01/06/16 0500  NA  --   < > 116*  < > 120*  --   < > 128* 130* 134* 137 141  K  --   < > 4.7  < > 4.6  --   < > 3.9 4.1 4.1 3.9 3.7  CL  --   < > 88*  < > 90*  --   < > 95* 96* 96* 99* 100*  CO2  --   < > 21*  < > 23  --   < > 25 26 29 30 28   GLUCOSE  --   < > 109*  < > 117*  --   < > 175* 154* 165* 147* 165*  BUN  --   < > 17  < > 15  --   < > 29* 28* 23* 42* 63*  CREATININE  --   < > 0.95  < > 0.85  --   < > 0.71 0.70 0.68 1.01 1.32*  CALCIUM  --   < > 8.1*  < > 8.2*  --   < > 8.1* 8.4* 8.6* 8.9 8.5*  MG 1.5*  --  1.6*  --  1.7 1.7  --  1.9  --   --   --   --   PHOS 2.1*  --  2.4*  --  2.1* 1.7*  --   --   --   --   --   --   < > = values in this interval not displayed.  EXAM;  General:  Critically ill, NAD, remains on vent, chest tubes in place. Neuro: sedated on vent, follows some commands  CV:  s1s2 irreg, Stable hemodynamically. PULM: resps even non labored on vent,  GI: abd soft, non tender  Extremities: Moves LLE spontaneously.  LLE mottled, more so on L foot.  Leg warm, foot cool but not cold.  No pulses distal to knee.  Good femoral and popliteal pulses.    PLAN -  Called orthopedics given known L pelvic ring fx.  PA came to bedside to assess and agreed that vascular should be consulted.  ?clot in setting AFib, not fully anticoagulated r/t trauma.  Arterial doppler LLE stat  Vascular to see - d/w Dr. Bridgett Larsson  Continue DVT prophylactic lovenox   CC time 42  mins   Nickolas Madrid, NP 01/06/2016  10:03 AM Pager: [phone] or [phone]   Attending Note:  I have examined patient, reviewed labs, studies and notes. I have discussed the case with Shon Millet, and I agree with the data and plans as amended above. 80 yo man s/p trauma,  PTX, L acetabular fx, L3-4 fx. He was found to have a loss of DP pulse on RN assessment this am. On my evaluation confirmed that we are unable to palpate or doppler any pulses distal to the knee. We discussed with ortho, who agree with  vascular evaluation. He has A fib, is at some risk for thromboembolism, superimposed on known vascular disease. Arterial dopplers will be performed.  Independent critical care time is 40 minutes.   Baltazar Apo, MD, PhD 01/06/2016, 12:06 PM [facility] [phone] or if no answer [phone]

## 2016-01-06 NOTE — Progress Notes (Signed)
Subjective: Sedated on vent.  CT in place Cold ischemic LLE, I cannot fine femoral pulse right now  Objective: Vital signs in last 24 hours: Temp:  [99 F (37.2 C)-103.4 F (39.7 C)] 103 F (39.4 C) (08/19 0947) Pulse Rate:  [54-149] 137 (08/19 1100) Resp:  [13-32] 29 (08/19 1100) BP: (92-124)/(65-96) 111/96 (08/19 1100) SpO2:  [98 %-100 %] 98 % (08/19 1130) FiO2 (%):  [40 %] 40 % (08/19 1130) Weight:  [80 kg (176 lb 5.9 oz)] 80 kg (176 lb 5.9 oz) (08/19 0200) Last BM Date: 01/02/16 Temperature 103 this a.m.   Intake/Output from previous day: 08/18 0701 - 08/19 0700 In: 4336.9 [I.V.:2551.9; NG/GT:1585; IV Piggyback:200] Out: 2536 [Urine:1541] Intake/Output this shift: Total I/O In: 726.2 [I.V.:366.2; NG/GT:260; IV Piggyback:100] Out: -   General appearance: Sedated on the vent, he appears uncomfortable trying to move his left lower leg. Resp: clear to auscultation bilaterally and Chest tube in place with smaller air leak Cardio: Ongoing tachycardia with atrial fibrillation/hypotensive GI: Soft, hypoactive bowel sounds Extremities: Left lower extremity is cold, mottled and appears ischemic.  Lab Results:   Recent Labs  01/05/16 0828 01/06/16 0500  WBC 16.9* 18.1*  HGB 11.8* 11.3*  HCT 34.2* 35.4*  PLT 204 211    BMET  Recent Labs  01/05/16 0828 01/06/16 0500  NA 137 141  K 3.9 3.7  CL 99* 100*  CO2 30 28  GLUCOSE 147* 165*  BUN 42* 63*  CREATININE 1.01 1.32*  CALCIUM 8.9 8.5*   PT/INR No results for input(s): LABPROT, INR in the last 72 hours.   Recent Labs Lab 12/30/15 2150  AST 49*  ALT 31  ALKPHOS 54  BILITOT 1.8*  PROT 5.6*  ALBUMIN 3.3*     Lipase     Component Value Date/Time   LIPASE 31 12/28/2015 2334     Studies/Results: Dg Chest Port 1 View  Result Date: 01/05/2016 CLINICAL DATA:  Pneumonia and intubation. EXAM: PORTABLE CHEST 1 VIEW COMPARISON:  01/04/2016 FINDINGS: Endotracheal tube is appropriately positioned above  the carina. PICC line tip in the SVC region. Stable position of the left chest tube. Large amount of subcutaneous gas in the left chest. No evidence for a pneumothorax. Heart size remains mildly enlarged. Patchy densities in the right lower lung. Linear density at the left lung base could represent atelectasis. Aortic atherosclerotic calcifications. Nasogastric tube is coiled in the stomach. IMPRESSION: Stable position of the left chest tube without a definite pneumothorax. Persistent patchy densities at the right lung base. Findings could represent airspace disease or atelectasis. Support apparatuses as described. Electronically Signed   By: Markus Daft M.D.   On: 01/05/2016 07:23    Medications: . antiseptic oral rinse  7 mL Mouth Rinse 10 times per day  . atropine  0.4 mg Intravenous Once  . chlorhexidine gluconate (SAGE KIT)  15 mL Mouth Rinse BID  . enoxaparin (LOVENOX) injection  40 mg Subcutaneous Q24H  . famotidine (PEPCID) IV  20 mg Intravenous Q24H  . folic acid  1 mg Oral Daily  . insulin aspart  0-15 Units Subcutaneous Q4H  . ipratropium  0.5 mg Nebulization Q4H  . levalbuterol  0.63 mg Nebulization Q4H  . metoprolol tartrate  12.5 mg Oral Q6H  . multivitamin with minerals  1 tablet Oral Daily  . piperacillin-tazobactam (ZOSYN)  IV  3.375 g Intravenous Q8H  . sertraline  50 mg Oral Daily  . sodium chloride flush  10-40 mL Intracatheter Q12H  .  thiamine  100 mg Oral Daily   Or  . thiamine  100 mg Intravenous Daily    Assessment/Plan Fall Multiple left rib fractures 3-12/L Pneumothorax -  CT 12/30/15 L3-4 transverse process fractures Left acetabular fracture Ventilator dependent respiratory failure EtOH/tobacco use - CIWA protocol Cardiomyopathy/CHF/EF 25-30% Atrial fibrillation- amiodarone/Lopressor Left lower extremity ischemia -new finding this a.m. -Dr. Bridgett Larsson evaluating  Plan: CCM is where Dr. Bridgett Larsson has a note on the chart and is following. Multiple medical issues.     LOS: 8 days    JENNINGS,WILLARD 01/06/2016 [phone] I have seen and examined this patient and agree with the assessment and plan.   Matt B. Hassell Done, MD, [facility] [phone] beeper [phone]  01/06/2016 2:36 PM

## 2016-01-07 ENCOUNTER — Inpatient Hospital Stay (HOSPITAL_COMMUNITY): Payer: Medicare Other

## 2016-01-07 LAB — GLUCOSE, CAPILLARY
GLUCOSE-CAPILLARY: 138 mg/dL — AB (ref 65–99)
GLUCOSE-CAPILLARY: 195 mg/dL — AB (ref 65–99)
GLUCOSE-CAPILLARY: 154 mg/dL — AB (ref 65–99)
GLUCOSE-CAPILLARY: 153 mg/dL — AB (ref 65–99)
GLUCOSE-CAPILLARY: 186 mg/dL — AB (ref 65–99)
Glucose-Capillary: 159 mg/dL — ABNORMAL HIGH (ref 65–99)

## 2016-01-07 LAB — CK: Total CK: 58 U/L (ref 49–397)

## 2016-01-07 NOTE — Progress Notes (Addendum)
   Daily Progress Note  Assessment/Planning: Acute on chronic PAD, severe BLE tibial artery disease   Both feet pink today, suggesting again transient impaired cardiac output in setting of baseline poor arterial perfusion  Patient not a candidate for any endovascular interventions until medical status stabilizes  Nothing else to add to care at this point.  If patient's perfusion of feet worsens, feel free to re-consult.  Patient can also follow up with me in the office as an outpatient [phone])  Subjective    Intubated, no events overnight  Objective Vitals:   01/07/16 0700 01/07/16 0729 01/07/16 0741 01/07/16 0800  BP: 99/72  99/71 104/75  Pulse: (!) 111  (!) 119 (!) 107  Resp: 18  (!) 22 18  Temp:    98.4 F (36.9 C)  TempSrc:    Axillary  SpO2: 100% 100% 100% 100%  Weight:      Height:        Intake/Output Summary (Last 24 hours) at 01/07/16 0853 Last data filed at 01/07/16 0800  Gross per 24 hour  Intake           3941.2 ml  Output             1258 ml  Net           2683.2 ml   VASC  Both feet pink today, mottling completely resolved  Laboratory CBC    Component Value Date/Time   WBC 18.1 (H) 01/06/2016 0500   HGB 11.3 (L) 01/06/2016 0500   HCT 35.4 (L) 01/06/2016 0500   PLT 211 01/06/2016 0500    BMET    Component Value Date/Time   NA 141 01/06/2016 0500   K 3.7 01/06/2016 0500   CL 100 (L) 01/06/2016 0500   CO2 28 01/06/2016 0500   GLUCOSE 165 (H) 01/06/2016 0500   BUN 63 (H) 01/06/2016 0500   CREATININE 1.32 (H) 01/06/2016 0500   CREATININE 0.82 03/15/2013 1226   CALCIUM 8.5 (L) 01/06/2016 0500   GFRNONAA 50 (L) 01/06/2016 0500   GFRAA 58 (L) 01/06/2016 0500    Adele Barthel, MD [facility] [place] Office: [phone] Pager: [phone]  01/07/2016, 8:53 AM

## 2016-01-07 NOTE — Progress Notes (Signed)
ETT holder replaced because ETT continued to move out of placement.  ETT secured 25 at lip.  Pt tolerated fairly well.  RT will continue to monitor.

## 2016-01-07 NOTE — Progress Notes (Signed)
  Subjective: NAE   Objective: Vital signs in last 24 hours: Temp:  [97.6 F (36.4 C)-103.4 F (39.7 C)] 98.4 F (36.9 C) (08/20 0800) Pulse Rate:  [40-150] 107 (08/20 0800) Resp:  [17-36] 18 (08/20 0800) BP: (83-125)/(59-109) 104/75 (08/20 0800) SpO2:  [98 %-100 %] 100 % (08/20 0800) FiO2 (%):  [40 %] 40 % (08/20 0800) Weight:  [84.3 kg (185 lb 13.6 oz)] 84.3 kg (185 lb 13.6 oz) (08/20 0200) Last BM Date: 01/02/16  Intake/Output from previous day: 08/19 0701 - 08/20 0700 In: 4100 [I.V.:2275; NG/GT:1625; IV Piggyback:200] Out: 1258 [Urine:1258] Intake/Output this shift: Total I/O In: 155.8 [I.V.:90.8; NG/GT:65] Out: -   General appearance: sedated Resp: rhonchi Bilateral Cardio: irreg irreg  Lab Results:   Recent Labs  01/05/16 0828 01/06/16 0500  WBC 16.9* 18.1*  HGB 11.8* 11.3*  HCT 34.2* 35.4*  PLT 204 211   BMET  Recent Labs  01/05/16 0828 01/06/16 0500  NA 137 141  K 3.9 3.7  CL 99* 100*  CO2 30 28  GLUCOSE 147* 165*  BUN 42* 63*  CREATININE 1.01 1.32*  CALCIUM 8.9 8.5*   PT/INR No results for input(s): LABPROT, INR in the last 72 hours. ABG  Recent Labs  01/04/16 0908  PHART 7.424  HCO3 26.8*    Studies/Results: No results found.  Anti-infectives: Anti-infectives    Start     Dose/Rate Route Frequency Ordered Stop   01/04/16 1000  piperacillin-tazobactam (ZOSYN) IVPB 3.375 g     3.375 g 12.5 mL/hr over 240 Minutes Intravenous Every 8 hours 01/04/16 0841        Assessment/Plan: Fall Multiple left rib fractures 3-12/L Pneumothorax -  CT 12/30/15 L3-4 transverse process fractures Left acetabular fracture Ventilator dependent respiratory failure- repeat CXR this AM, CT with airleak EtOH/tobacco use - CIWA protocol Cardiomyopathy/CHF/EF 25-30% Atrial fibrillation- amiodarone/Lopressor LLE pink today, no plans for intervention as per family wishes   LOS: 9 days    Rosario Jacks., Anne Hahn 01/07/2016

## 2016-01-07 NOTE — Progress Notes (Signed)
Brief Progress Note -   Called by Trauma 8/19 to assist with this pt who was previously seen by PCCM for respiratory failure r/t traumatic ptx.  On 8/19 RN reported inability to doppler L foot pulses, foot cool and mottled.  PCCM consulted vascular who suspect micro emboli from AFib without anticoagulation and/or transient poor cardiac output with baseline poor arterial perfusion.  Foot warmer, pink this am.    Recent Labs Lab 01/04/16 0430 01/05/16 0828 01/06/16 0500  HGB 11.5* 11.8* 11.3*  HCT 34.5* 34.2* 35.4*  WBC 12.0* 16.9* 18.1*  PLT 183 204 211    Recent Labs Lab 12/31/15 1200  12/31/15 1940  01/01/16 0340 01/01/16 1712  01/02/16 1639 01/03/16 0410 01/04/16 0430 01/05/16 0828 01/06/16 0500  NA  --   < > 116*  < > 120*  --   < > 128* 130* 134* 137 141  K  --   < > 4.7  < > 4.6  --   < > 3.9 4.1 4.1 3.9 3.7  CL  --   < > 88*  < > 90*  --   < > 95* 96* 96* 99* 100*  CO2  --   < > 21*  < > 23  --   < > 25 26 29 30 28   GLUCOSE  --   < > 109*  < > 117*  --   < > 175* 154* 165* 147* 165*  BUN  --   < > 17  < > 15  --   < > 29* 28* 23* 42* 63*  CREATININE  --   < > 0.95  < > 0.85  --   < > 0.71 0.70 0.68 1.01 1.32*  CALCIUM  --   < > 8.1*  < > 8.2*  --   < > 8.1* 8.4* 8.6* 8.9 8.5*  MG 1.5*  --  1.6*  --  1.7 1.7  --  1.9  --   --   --   --   PHOS 2.1*  --  2.4*  --  2.1* 1.7*  --   --   --   --   --   --   < > = values in this interval not displayed.  EXAM;  General:  Critically ill, NAD, remains on vent, chest tubes in place. Neuro: sedated on vent, follows some commands  CV:  s1s2 irreg, Stable hemodynamically. PULM: resps even non labored on vent,  GI: abd soft, non tender  Extremities: Moves LLE spontaneously.  LLE more pink, warm.     PLAN -  Vascular following  No evidence of arterial thrombus on LLE arterial duplex  Deemed not candidate for any endovascular interventions  Continue DVT prophylactic lovenox   PCCM signing off please call back if needed.     Nickolas Madrid, NP 01/07/2016  10:16 AM Pager: [phone] or [phone]  Attending Note:  I have examined patient, reviewed labs, studies and notes. I have discussed the case with Shon Millet, and I agree with the data and plans as amended above.   Baltazar Apo, MD, PhD 01/07/2016, 10:28 AM [facility] [phone] or if no answer [phone]

## 2016-01-08 ENCOUNTER — Inpatient Hospital Stay (HOSPITAL_COMMUNITY): Payer: Medicare Other

## 2016-01-08 DIAGNOSIS — I481 Persistent atrial fibrillation: Secondary | ICD-10-CM

## 2016-01-08 LAB — GLUCOSE, CAPILLARY
GLUCOSE-CAPILLARY: 147 mg/dL — AB (ref 65–99)
GLUCOSE-CAPILLARY: 106 mg/dL — AB (ref 65–99)
GLUCOSE-CAPILLARY: 115 mg/dL — AB (ref 65–99)
GLUCOSE-CAPILLARY: 125 mg/dL — AB (ref 65–99)
Glucose-Capillary: 130 mg/dL — ABNORMAL HIGH (ref 65–99)

## 2016-01-08 LAB — CBC WITH DIFFERENTIAL/PLATELET
Basophils Absolute: 0 10*3/uL (ref 0.0–0.1)
Basophils Relative: 0 %
EOS ABS: 0 10*3/uL (ref 0.0–0.7)
EOS PCT: 0 %
HCT: 37.3 % — ABNORMAL LOW (ref 39.0–52.0)
HEMOGLOBIN: 11.7 g/dL — AB (ref 13.0–17.0)
LYMPHS ABS: 2.7 10*3/uL (ref 0.7–4.0)
Lymphocytes Relative: 16 %
MCH: 33 pg (ref 26.0–34.0)
MCHC: 31.4 g/dL (ref 30.0–36.0)
MCV: 105.1 fL — AB (ref 78.0–100.0)
MONOS PCT: 4 %
Monocytes Absolute: 0.6 10*3/uL (ref 0.1–1.0)
NEUTROS PCT: 80 %
Neutro Abs: 13.8 10*3/uL — ABNORMAL HIGH (ref 1.7–7.7)
Platelets: 219 10*3/uL (ref 150–400)
RBC: 3.55 MIL/uL — ABNORMAL LOW (ref 4.22–5.81)
RDW: 15 % (ref 11.5–15.5)
WBC: 17.2 10*3/uL — ABNORMAL HIGH (ref 4.0–10.5)

## 2016-01-08 LAB — BASIC METABOLIC PANEL
Anion gap: 7 (ref 5–15)
BUN: 66 mg/dL — AB (ref 6–20)
CHLORIDE: 108 mmol/L (ref 101–111)
CO2: 32 mmol/L (ref 22–32)
CREATININE: 1.11 mg/dL (ref 0.61–1.24)
Calcium: 8.2 mg/dL — ABNORMAL LOW (ref 8.9–10.3)
GFR calc Af Amer: >60 mL/min (ref >60)
GFR calc non Af Amer: >60 mL/min (ref >60)
GLUCOSE: 111 mg/dL — AB (ref 65–99)
Potassium: 3.5 mmol/L (ref 3.5–5.1)
SODIUM: 147 mmol/L — AB (ref 135–145)

## 2016-01-08 MED ORDER — DEXTROSE 5 % IV SOLN
2.0000 g | Freq: Two times a day (BID) | INTRAVENOUS | Status: AC
  Administered 2016-01-08 – 2016-01-15 (×16): 2 g via INTRAVENOUS
  Filled 2016-01-08 (×17): qty 2

## 2016-01-08 MED ORDER — BETHANECHOL 1 MG/ML PEDIATRIC ORAL SUSPENSION: 10.0000 mg | Freq: Three times a day (TID) | ORAL | Status: DC

## 2016-01-08 MED ORDER — BETHANECHOL CHLORIDE 10 MG PO TABS
10.0000 mg | ORAL_TABLET | Freq: Three times a day (TID) | ORAL | Status: DC
  Administered 2016-01-08 – 2016-01-19 (×30): 10 mg
  Filled 2016-01-08 (×37): qty 1

## 2016-01-08 NOTE — Progress Notes (Signed)
Pt had positive cuff leak.

## 2016-01-08 NOTE — Care Management Important Message (Signed)
Important Message  Patient Details  Name: Chad Rogers MRN: [id number] Date of Birth: Jul 05, 1935   Medicare Important Message Given:  Yes    Chad Rogers Abena 01/08/2016, 11:11 AM

## 2016-01-08 NOTE — Progress Notes (Signed)
    Subjective:  Intubated; opens eyes   Objective:  Vitals:   01/08/16 1058 01/08/16 1100 01/08/16 1104 01/08/16 1200  BP:  103/77  97/67  Pulse:  (!) 117  (!) 122  Resp:  (!) 22  (!) 23  Temp:    99.6 F (37.6 C)  TempSrc:    Axillary  SpO2: 99% 99% 100% 98%  Weight:      Height:        Intake/Output from previous day:  Intake/Output Summary (Last 24 hours) at 01/08/16 1303 Last data filed at 01/08/16 1200  Gross per 24 hour  Intake          3709.63 ml  Output             2100 ml  Net          1609.63 ml    Physical Exam: Physical exam: Well-developed intubated Skin is warm and dry.  HEENT is normal.  Neck is supple. Chest CTA anteriorly Cardiovascular exam is irregular Abdominal exam nontender or distended. No masses palpated. Extremities show 1+ edema. neuro not assessed    Lab Results: Basic Metabolic Panel:  Recent Labs  01/06/16 0500 01/08/16 0809  NA 141 147*  K 3.7 3.5  CL 100* 108  CO2 28 32  GLUCOSE 165* 111*  BUN 63* 66*  CREATININE 1.32* 1.11  CALCIUM 8.5* 8.2*   CBC:  Recent Labs  01/06/16 0500 01/08/16 0809  WBC 18.1* 17.2*  NEUTROABS 13.2* 13.8*  HGB 11.3* 11.7*  HCT 35.4* 37.3*  MCV 103.5* 105.1*  PLT 211 219   Cardiac Enzymes:  Recent Labs  01/07/16 0515  CKTOTAL 58     Assessment/Plan:  1 Atrial fibrillation-continue IV amiodarone and metoprolol for rate control; can consider initiating anticoagulation later as he improves (possible short term for anticoagulation). 2 NICM-will try and adjust meds after he is extubated as BP allows. 3 Rib fxs and trauma-per surgery 4 chronic systolic CHF-follow exam and diurese as needed 5 PVD-fu vascular surgery  [name] 01/08/2016, 1:03 PM

## 2016-01-08 NOTE — Progress Notes (Signed)
Follow up - Trauma and Critical Care  Patient Details:    Chad Rogers is an 80 y.o. male.  Lines/tubes : Airway 8 mm (Active)  Secured at (cm) 25 cm 01/08/2016  7:33 AM  Measured From Lips 01/08/2016  7:33 AM  Secured Location Center 01/08/2016  7:33 AM  Secured By [employer] 01/08/2016  7:33 AM  Tube Holder Repositioned Yes 01/08/2016  7:33 AM  Cuff Pressure (cm H2O) 26 cm H2O 01/07/2016  7:00 PM  Site Condition Dry 01/08/2016  7:33 AM     PICC Triple Lumen [id number] PICC Right Basilic 38 cm 0 cm (Active)  Indication for Insertion or Continuance of Line Prolonged intravenous therapies 01/07/2016  8:00 PM  Exposed Catheter (cm) 0 cm 12/30/2015  3:21 PM  Site Assessment Clean;Dry;Intact 01/07/2016  8:00 PM  Lumen #1 Status Infusing 01/07/2016  8:00 PM  Lumen #2 Status Infusing 01/07/2016  8:00 PM  Lumen #3 Status Infusing 01/07/2016  8:00 PM  Dressing Type Transparent 01/07/2016  8:00 PM  Dressing Status Clean;Dry;Intact;Antimicrobial disc in place 01/07/2016  8:00 PM  Line Care Connections checked and tightened 01/07/2016  8:00 PM  Dressing Intervention Dressing changed 01/05/2016 11:25 PM  Dressing Change Due 01/13/16 01/07/2016  8:00 PM     Chest Tube Left (Active)  Suction To water seal 01/07/2016  8:00 PM  Chest Tube Air Leak Minimal 01/07/2016  8:00 PM  Patency Intervention Tip/tilt 01/05/2016 12:00 AM  Drainage Description Other (Comment) 01/06/2016  8:00 AM  Dressing Status Dry;Intact;Old drainage 01/07/2016  8:00 PM  Dressing Intervention New dressing 12/30/2015 10:00 PM  Site Assessment Other (Comment) 01/05/2016  8:00 AM  Surrounding Skin Unable to view 01/07/2016  8:00 AM  Output (mL) 0 mL 01/04/2016  8:00 PM     NG/OG Tube Orogastric 16 Fr. Right mouth (Active)  Site Assessment Clean;Dry;Intact 01/07/2016  8:00 PM  Ongoing Placement Verification Auscultation 01/07/2016  8:00 PM  Status Infusing tube feed 01/07/2016  8:00 PM  Intake (mL) 90 mL 01/05/2016  9:39 AM  Output  (mL) 160 mL 01/01/2016  5:00 AM    Microbiology/Sepsis markers: Results for orders placed or performed during the hospital encounter of 12/28/15  MRSA PCR Screening     Status: None   Collection Time: 12/29/15  5:06 AM  Result Value Ref Range Status   MRSA by PCR NEGATIVE NEGATIVE Final    Comment:        The GeneXpert MRSA Assay (FDA approved for NASAL specimens only), is one component of a comprehensive MRSA colonization surveillance program. It is not intended to diagnose MRSA infection nor to guide or monitor treatment for MRSA infections.   Culture, respiratory (NON-Expectorated)     Status: None   Collection Time: 01/03/16 11:55 AM  Result Value Ref Range Status   Specimen Description TRACHEAL ASPIRATE  Final   Special Requests Normal  Final   Gram Stain   Final    ABUNDANT WBC PRESENT, PREDOMINANTLY PMN NO SQUAMOUS EPITHELIAL CELLS SEEN ABUNDANT GRAM NEGATIVE COCCOBACILLI FEW GRAM POSITIVE RODS FEW GRAM POSITIVE COCCI IN PAIRS RARE GRAM NEGATIVE RODS    Culture   Final    ABUNDANT HAEMOPHILUS INFLUENZAE BETA LACTAMASE NEGATIVE MODERATE ESCHERICHIA COLI    Report Status 01/06/2016 FINAL  Final   Organism ID, Bacteria ESCHERICHIA COLI  Final      Susceptibility   Escherichia coli - MIC*    AMPICILLIN <=2 SENSITIVE Sensitive     CEFAZOLIN <=4 SENSITIVE Sensitive  CEFEPIME <=1 SENSITIVE Sensitive     CEFTAZIDIME <=1 SENSITIVE Sensitive     CEFTRIAXONE <=1 SENSITIVE Sensitive     CIPROFLOXACIN <=0.25 SENSITIVE Sensitive     GENTAMICIN <=1 SENSITIVE Sensitive     IMIPENEM <=0.25 SENSITIVE Sensitive     TRIMETH/SULFA <=20 SENSITIVE Sensitive     AMPICILLIN/SULBACTAM <=2 SENSITIVE Sensitive     PIP/TAZO <=4 SENSITIVE Sensitive     Extended ESBL NEGATIVE Sensitive     * MODERATE ESCHERICHIA COLI    Anti-infectives:  Anti-infectives    Start     Dose/Rate Route Frequency Ordered Stop   01/08/16 1000  ceFEPIme (MAXIPIME) 2 g in dextrose 5 % 50 mL IVPB     2  g 100 mL/hr over 30 Minutes Intravenous Every 12 hours 01/08/16 0751     01/04/16 1000  piperacillin-tazobactam (ZOSYN) IVPB 3.375 g  Status:  Discontinued     3.375 g 12.5 mL/hr over 240 Minutes Intravenous Every 8 hours 01/04/16 0841 01/08/16 0751      Best Practice/Protocols:  VTE Prophylaxis: Lovenox (prophylaxtic dose) GI Prophylaxis: Proton Pump Inhibitor Continous Sedation  Consults: Treatment Team:  [place], MD [facility], MD    Events:  Subjective:    Overnight Issues: Patient has been very alert this morning and following some commands.  Objective:  Vital signs for last 24 hours: Temp:  [97.5 F (36.4 C)-99.8 F (37.7 C)] 97.6 F (36.4 C) (08/21 0400) Pulse Rate:  [35-129] 35 (08/21 0700) Resp:  [16-32] 18 (08/21 0700) BP: (74-108)/(33-82) 90/65 (08/21 0700) SpO2:  [98 %-100 %] 98 % (08/21 0733) FiO2 (%):  [40 %] 40 % (08/21 0733) Weight:  [83.6 kg (184 lb 4.9 oz)] 83.6 kg (184 lb 4.9 oz) (08/21 0200)  Hemodynamic parameters for last 24 hours:    Intake/Output from previous day: 08/20 0701 - 08/21 0700 In: 3798.3 [I.V.:2103.3; MC:[id number]; IV Piggyback:200] Out: 2175 [Urine:2175]  Intake/Output this shift: No intake/output data recorded.  Vent settings for last 24 hours: Vent Mode: CPAP;PSV FiO2 (%):  [40 %] 40 % Set Rate:  [18 bmp] 18 bmp Vt Set:  [540 mL] 540 mL PEEP:  [5 cmH20] 5 cmH20 Pressure Support:  [8 cmH20] 8 cmH20 Plateau Pressure:  [16 [id number] cmH20] 20 cmH20  Physical Exam:  General: alert, no respiratory distress and following some commands Neuro: alert, nonfocal exam, RASS 0 and agitated Resp: clear to auscultation bilaterally and Much more clear than last week.  Less secretions but still thick and yellow. CVS: IRR and rate is down and still wide complex. GI: soft, nontender, BS WNL, no r/g and tolerating tube feedings well. Extremities: pulses doppler,  and cool feet.  No palpable pulses.  Not mottled or  ischemic grossly  Results for orders placed or performed during the hospital encounter of 12/28/15 (from the past 24 hour(s))  Glucose, capillary     Status: Abnormal   Collection Time: 01/07/16 11:12 AM  Result Value Ref Range   Glucose-Capillary 195 (H) 65 - 99 mg/dL  Glucose, capillary     Status: Abnormal   Collection Time: 01/07/16  4:08 PM  Result Value Ref Range   Glucose-Capillary 154 (H) 65 - 99 mg/dL  Glucose, capillary     Status: Abnormal   Collection Time: 01/07/16  7:33 PM  Result Value Ref Range   Glucose-Capillary 153 (H) 65 - 99 mg/dL  Glucose, capillary     Status: Abnormal   Collection Time: 01/07/16 11:00 PM  Result Value Ref  Range   Glucose-Capillary 186 (H) 65 - 99 mg/dL  Glucose, capillary     Status: Abnormal   Collection Time: 01/08/16  3:58 AM  Result Value Ref Range   Glucose-Capillary 147 (H) 65 - 99 mg/dL  Glucose, capillary     Status: Abnormal   Collection Time: 01/08/16  7:47 AM  Result Value Ref Range   Glucose-Capillary 106 (H) 65 - 99 mg/dL   Comment 1 Notify RN    Comment 2 Document in Chart      Assessment/Plan:   NEURO  Altered Mental Status:  agitation, delirium, pain and sedation   Plan: Wean sedation as we wean on the ventilator.  PULM  Atelectasis/collapse (focal) Pneumonia: hospital acquired (not ventilator-associated) Haemophilus and E.coli Chest Wall Trauma rib fractures and Pneumothorax (traumatic and with an air leak)   Plan: Changed antibiotics to Cefepime, chest tube to suction.  Check CXR today.  CARDIO  Atrial Fibrillation (with controlled ventricular response and wide complex)   Plan: Continue amiodarone  RENAL  Actue Renal Failure (due to hypovolemia/decreased circulating volume) Urine output has been good, but BUN/creatinine up to 63/1.32 on Saturday and has not been rechecked.   Plan: Recheck labs, stop all diuretics  GI  No specific issues   Plan: Continue tube feedings and cosider free water  ID  Pneumonia  (hospital acquired (not ventilator-associated) E.coli and Haemophilus PNA)   Plan: Start Cefepime  HEME  No recent labs.  Will recheck today and plan accordingly   Plan: Check labs.  ENDO No specific issues   Plan: CPM  Global Issues  Patient doing better on the ventilator, but has some other issues that need to be addressed.  May be able to extubate within the next 48 hours and this may be fien, but if reintubated with probably need trach/PEG later this week.    LOS: 10 days   Additional comments:None and Labs and CXR are pending.  Critical Care Total Time*: 79 Minutes  Sheriff Zyaire Mccleod 01/08/2016  *Care during the described time interval was provided by me and/or other providers on the critical care team.  I have reviewed this patient's available data, including medical history, events of note, physical examination and test results as part of my evaluation.

## 2016-01-08 NOTE — Progress Notes (Signed)
Nutrition Follow-up  DOCUMENTATION CODES:   Not applicable  INTERVENTION:    Continue Vital AF 1.2 via OGT at 65 ml/h (1560 ml/day) to provide 1872 kcals (97% of estimated needs), 117 gm protein, 1265 ml free water daily.  NUTRITION DIAGNOSIS:   Inadequate oral intake related to inability to eat as evidenced by NPO status.  Ongoing  GOAL:   Patient will meet greater than or equal to 90% of their needs  Met  MONITOR:   TF tolerance, Skin, I & O's, Labs  REASON FOR ASSESSMENT:   Consult Enteral/tube feeding initiation and management  ASSESSMENT:   Pt with hx of COPD and ETOH who was admitted s/p fall with left sided rib fxs 3-12 with small pneumothorax (CT placed 8/12), L3 and L4 transverse process fx, left acetabular fx, and SIADH.    Labs reviewed: sodium elevated. Medications reviewed and include folic acid, novolog, MVI, thiamine. CBG's: [phone] Patient is currently receiving Vital AF 1.2 via OGT at 65 ml/h (1560 ml/day) to provide 1872 kcals, 117 gm protein, 1265 ml free water daily.  Discussed patient with RN today. Tolerating TF well.  Patient with positive cuff leak today. Hopeful to extubate within the next 48 hours, but may eventually need trach/PEG if unable to extubate successfully. Patient is currently intubated on ventilator support MV: 11.2 L/min Temp (24hrs), Avg:98.4 F (36.9 C), Min:97.5 F (36.4 C), Max:99.6 F (37.6 C)  Diet Order:  Diet NPO time specified  Skin:  Reviewed, no issues  Last BM:  8/20  Height:   Ht Readings from Last 1 Encounters:  12/29/15 5' 10"  (1.778 m)    Weight:   Wt Readings from Last 1 Encounters:  01/08/16 184 lb 4.9 oz (83.6 kg)    Ideal Body Weight:  75.4 kg  BMI:  Body mass index is 26.44 kg/m.  Estimated Nutritional Needs:   Kcal:  1924  Protein:  110-125 grams  Fluid:  > 1.5 L/day  EDUCATION NEEDS:   No education needs identified at this time  Molli Barrows, [place], [place], [place]on [place] Pager  [phone] After Hours Pager [phone]

## 2016-01-09 ENCOUNTER — Inpatient Hospital Stay (HOSPITAL_COMMUNITY): Payer: Medicare Other

## 2016-01-09 LAB — GLUCOSE, CAPILLARY
GLUCOSE-CAPILLARY: 114 mg/dL — AB (ref 65–99)
GLUCOSE-CAPILLARY: 154 mg/dL — AB (ref 65–99)
GLUCOSE-CAPILLARY: 126 mg/dL — AB (ref 65–99)
Glucose-Capillary: 114 mg/dL — ABNORMAL HIGH (ref 65–99)
Glucose-Capillary: 136 mg/dL — ABNORMAL HIGH (ref 65–99)
Glucose-Capillary: 182 mg/dL — ABNORMAL HIGH (ref 65–99)

## 2016-01-09 LAB — BASIC METABOLIC PANEL
Anion gap: 7 (ref 5–15)
BUN: 56 mg/dL — AB (ref 6–20)
CHLORIDE: 112 mmol/L — AB (ref 101–111)
CO2: 31 mmol/L (ref 22–32)
Calcium: 8.2 mg/dL — ABNORMAL LOW (ref 8.9–10.3)
Creatinine, Ser: 1.09 mg/dL (ref 0.61–1.24)
GFR calc Af Amer: >60 mL/min (ref >60)
GFR calc non Af Amer: >60 mL/min (ref >60)
GLUCOSE: 176 mg/dL — AB (ref 65–99)
POTASSIUM: 3.4 mmol/L — AB (ref 3.5–5.1)
Sodium: 150 mmol/L — ABNORMAL HIGH (ref 135–145)

## 2016-01-09 LAB — CBC WITH DIFFERENTIAL/PLATELET
Basophils Absolute: 0 10*3/uL (ref 0.0–0.1)
Basophils Relative: 0 %
Eosinophils Absolute: 0 10*3/uL (ref 0.0–0.7)
Eosinophils Relative: 0 %
HCT: 35.8 % — ABNORMAL LOW (ref 39.0–52.0)
HEMOGLOBIN: 11 g/dL — AB (ref 13.0–17.0)
LYMPHS ABS: 2.3 10*3/uL (ref 0.7–4.0)
LYMPHS PCT: 17 %
MCH: 33.1 pg (ref 26.0–34.0)
MCHC: 30.7 g/dL (ref 30.0–36.0)
MCV: 107.8 fL — AB (ref 78.0–100.0)
MONOS PCT: 3 %
Monocytes Absolute: 0.4 10*3/uL (ref 0.1–1.0)
NEUTROS PCT: 80 %
Neutro Abs: 11.1 10*3/uL — ABNORMAL HIGH (ref 1.7–7.7)
Platelets: 210 10*3/uL (ref 150–400)
RBC: 3.32 MIL/uL — AB (ref 4.22–5.81)
RDW: 15.1 % (ref 11.5–15.5)
WBC: 13.8 10*3/uL — AB (ref 4.0–10.5)

## 2016-01-09 MED ORDER — LEVALBUTEROL HCL 0.63 MG/3ML IN NEBU
0.6300 mg | INHALATION_SOLUTION | Freq: Four times a day (QID) | RESPIRATORY_TRACT | Status: DC | PRN
  Filled 2016-01-09: qty 3

## 2016-01-09 MED ORDER — POTASSIUM CHLORIDE IN NACL 20-0.45 MEQ/L-% IV SOLN
INTRAVENOUS | Status: DC
  Administered 2016-01-09: 08:00:00 via INTRAVENOUS
  Administered 2016-01-10: 50 mL/h via INTRAVENOUS
  Filled 2016-01-09 (×2): qty 1000

## 2016-01-09 MED ORDER — IPRATROPIUM BROMIDE 0.02 % IN SOLN
0.5000 mg | RESPIRATORY_TRACT | Status: DC | PRN
  Filled 2016-01-09: qty 2.5

## 2016-01-09 NOTE — Progress Notes (Addendum)
Follow up - Trauma and Critical Care  Patient Details:    Chad Rogers is an 80 y.o. male.  Lines/tubes : Airway 8 mm (Active)  Secured at (cm) 25 cm 01/09/2016  3:00 AM  Measured From Lips 01/09/2016  3:00 AM  Secured Location Center 01/09/2016  3:00 AM  Secured By [employer] 01/09/2016  3:00 AM  Tube Holder Repositioned Yes 01/09/2016  3:00 AM  Cuff Pressure (cm H2O) 26 cm H2O 01/08/2016  7:01 PM  Site Condition Dry 01/08/2016  3:17 PM     PICC Triple Lumen [id number] PICC Right Basilic 38 cm 0 cm (Active)  Indication for Insertion or Continuance of Line Prolonged intravenous therapies 01/09/2016  7:48 AM  Exposed Catheter (cm) 0 cm 12/30/2015  3:21 PM  Site Assessment Clean;Dry;Intact 01/09/2016  7:48 AM  Lumen #1 Status Infusing 01/09/2016  7:48 AM  Lumen #2 Status Infusing 01/09/2016  7:48 AM  Lumen #3 Status Infusing 01/09/2016  7:48 AM  Dressing Type Transparent 01/09/2016  7:48 AM  Dressing Status Clean;Dry;Intact;Antimicrobial disc in place 01/09/2016  7:48 AM  Line Care Connections checked and tightened 01/09/2016  7:48 AM  Dressing Intervention Dressing changed 01/05/2016 11:25 PM  Dressing Change Due 01/12/16 01/09/2016  7:48 AM     Chest Tube Left (Active)  Suction -20 cm H2O 01/08/2016  8:00 PM  Chest Tube Air Leak Minimal 01/08/2016  8:00 PM  Patency Intervention Tip/tilt 01/08/2016  8:00 PM  Drainage Description Serous 01/08/2016  8:00 PM  Dressing Status Dry;Intact;Old drainage 01/08/2016  8:00 PM  Dressing Intervention Dressing reinforced 01/08/2016  8:00 PM  Site Assessment Other (Comment) 01/08/2016  8:00 PM  Surrounding Skin Unable to view 01/08/2016  8:00 PM  Output (mL) 0 mL 01/04/2016  8:00 PM     NG/OG Tube Orogastric 16 Fr. Right mouth (Active)  Site Assessment Clean;Dry;Intact 01/08/2016  8:00 PM  Ongoing Placement Verification Auscultation 01/08/2016  8:00 PM  Status Infusing tube feed 01/08/2016  8:00 PM  Intake (mL) 90 mL 01/05/2016  9:39 AM  Output (mL)  160 mL 01/01/2016  5:00 AM    Microbiology/Sepsis markers: Results for orders placed or performed during the hospital encounter of 12/28/15  MRSA PCR Screening     Status: None   Collection Time: 12/29/15  5:06 AM  Result Value Ref Range Status   MRSA by PCR NEGATIVE NEGATIVE Final    Comment:        The GeneXpert MRSA Assay (FDA approved for NASAL specimens only), is one component of a comprehensive MRSA colonization surveillance program. It is not intended to diagnose MRSA infection nor to guide or monitor treatment for MRSA infections.   Culture, respiratory (NON-Expectorated)     Status: None   Collection Time: 01/03/16 11:55 AM  Result Value Ref Range Status   Specimen Description TRACHEAL ASPIRATE  Final   Special Requests Normal  Final   Gram Stain   Final    ABUNDANT WBC PRESENT, PREDOMINANTLY PMN NO SQUAMOUS EPITHELIAL CELLS SEEN ABUNDANT GRAM NEGATIVE COCCOBACILLI FEW GRAM POSITIVE RODS FEW GRAM POSITIVE COCCI IN PAIRS RARE GRAM NEGATIVE RODS    Culture   Final    ABUNDANT HAEMOPHILUS INFLUENZAE BETA LACTAMASE NEGATIVE MODERATE ESCHERICHIA COLI    Report Status 01/06/2016 FINAL  Final   Organism ID, Bacteria ESCHERICHIA COLI  Final      Susceptibility   Escherichia coli - MIC*    AMPICILLIN <=2 SENSITIVE Sensitive     CEFAZOLIN <=4 SENSITIVE Sensitive  CEFEPIME <=1 SENSITIVE Sensitive     CEFTAZIDIME <=1 SENSITIVE Sensitive     CEFTRIAXONE <=1 SENSITIVE Sensitive     CIPROFLOXACIN <=0.25 SENSITIVE Sensitive     GENTAMICIN <=1 SENSITIVE Sensitive     IMIPENEM <=0.25 SENSITIVE Sensitive     TRIMETH/SULFA <=20 SENSITIVE Sensitive     AMPICILLIN/SULBACTAM <=2 SENSITIVE Sensitive     PIP/TAZO <=4 SENSITIVE Sensitive     Extended ESBL NEGATIVE Sensitive     * MODERATE ESCHERICHIA COLI    Anti-infectives:  Anti-infectives    Start     Dose/Rate Route Frequency Ordered Stop   01/08/16 1000  ceFEPIme (MAXIPIME) 2 g in dextrose 5 % 50 mL IVPB     2  g 100 mL/hr over 30 Minutes Intravenous Every 12 hours 01/08/16 0751     01/04/16 1000  piperacillin-tazobactam (ZOSYN) IVPB 3.375 g  Status:  Discontinued     3.375 g 12.5 mL/hr over 240 Minutes Intravenous Every 8 hours 01/04/16 0841 01/08/16 0751      Best Practice/Protocols:  VTE Prophylaxis: Lovenox (prophylaxtic dose) GI Prophylaxis: Proton Pump Inhibitor Continous Sedation  Consults: Treatment Team:  Altamese Damascus, MD [facility], MD    Events:  Subjective:    Overnight Issues: Patient did not wean well yesterday or overnight.  No acute distress.  Still very alert  Objective:  Vital signs for last 24 hours: Temp:  [98.9 F (37.2 C)-99.8 F (37.7 C)] 99.8 F (37.7 C) (08/22 0000) Pulse Rate:  [32-134] 97 (08/22 0700) Resp:  [17-35] 19 (08/22 0700) BP: (94-120)/(59-106) 98/75 (08/22 0700) SpO2:  [93 %-100 %] 100 % (08/22 0700) FiO2 (%):  [40 %] 40 % (08/22 0400) Weight:  [85.3 kg (188 lb 0.8 oz)] 85.3 kg (188 lb 0.8 oz) (08/22 0600)  Hemodynamic parameters for last 24 hours:    Intake/Output from previous day: 08/21 0701 - 08/22 0700 In: 3324.1 [I.V.:1679.1; NG/GT:1495; IV Piggyback:150] Out: 1326 [Urine:1325; Stool:1]  Intake/Output this shift: No intake/output data recorded.  Vent settings for last 24 hours: Vent Mode: PRVC FiO2 (%):  [40 %] 40 % Set Rate:  [18 bmp] 18 bmp Vt Set:  [540 mL] 540 mL PEEP:  [5 cmH20] 5 cmH20 Pressure Support:  [5 cmH20] 5 cmH20 Plateau Pressure:  [17 [id number] cmH20] 17 cmH20  Physical Exam:  General: alert and no respiratory distress Neuro: alert, oriented and nonfocal exam Resp: clear to auscultation bilaterally CVS: IRR and still with an irregular tachycardia GI: distended and Not tender, tympanic to percussion. Extremities: no edema, no erythema, pulses WNL and pulses doppler,   Results for orders placed or performed during the hospital encounter of 12/28/15 (from the past 24 hour(s))  CBC with  Differential/Platelet     Status: Abnormal   Collection Time: 01/08/16  8:09 AM  Result Value Ref Range   WBC 17.2 (H) 4.0 - 10.5 K/uL   RBC 3.55 (L) 4.22 - 5.81 MIL/uL   Hemoglobin 11.7 (L) 13.0 - 17.0 g/dL   HCT 37.3 (L) 39.0 - 52.0 %   MCV 105.1 (H) 78.0 - 100.0 fL   MCH 33.0 26.0 - 34.0 pg   MCHC 31.4 30.0 - 36.0 g/dL   RDW 15.0 11.5 - 15.5 %   Platelets 219 150 - 400 K/uL   Neutrophils Relative % 80 %   Neutro Abs 13.8 (H) 1.7 - 7.7 K/uL   Lymphocytes Relative 16 %   Lymphs Abs 2.7 0.7 - 4.0 K/uL   Monocytes Relative 4 %  Monocytes Absolute 0.6 0.1 - 1.0 K/uL   Eosinophils Relative 0 %   Eosinophils Absolute 0.0 0.0 - 0.7 K/uL   Basophils Relative 0 %   Basophils Absolute 0.0 0.0 - 0.1 K/uL  Basic metabolic panel     Status: Abnormal   Collection Time: 01/08/16  8:09 AM  Result Value Ref Range   Sodium 147 (H) 135 - 145 mmol/L   Potassium 3.5 3.5 - 5.1 mmol/L   Chloride 108 101 - 111 mmol/L   CO2 32 22 - 32 mmol/L   Glucose, Bld 111 (H) 65 - 99 mg/dL   BUN 66 (H) 6 - 20 mg/dL   Creatinine, Ser 1.11 0.61 - 1.24 mg/dL   Calcium 8.2 (L) 8.9 - 10.3 mg/dL   GFR calc non Af Amer >60 >60 mL/min   GFR calc Af Amer >60 >60 mL/min   Anion gap 7 5 - 15  Glucose, capillary     Status: Abnormal   Collection Time: 01/08/16 11:20 AM  Result Value Ref Range   Glucose-Capillary 115 (H) 65 - 99 mg/dL  Glucose, capillary     Status: Abnormal   Collection Time: 01/08/16  4:25 PM  Result Value Ref Range   Glucose-Capillary 130 (H) 65 - 99 mg/dL   Comment 1 Notify RN    Comment 2 Document in Chart   Glucose, capillary     Status: Abnormal   Collection Time: 01/08/16 10:03 PM  Result Value Ref Range   Glucose-Capillary 125 (H) 65 - 99 mg/dL  Glucose, capillary     Status: Abnormal   Collection Time: 01/09/16 12:21 AM  Result Value Ref Range   Glucose-Capillary 182 (H) 65 - 99 mg/dL  Glucose, capillary     Status: Abnormal   Collection Time: 01/09/16  4:12 AM  Result Value Ref  Range   Glucose-Capillary 114 (H) 65 - 99 mg/dL  CBC with Differential/Platelet     Status: Abnormal   Collection Time: 01/09/16  5:55 AM  Result Value Ref Range   WBC 13.8 (H) 4.0 - 10.5 K/uL   RBC 3.32 (L) 4.22 - 5.81 MIL/uL   Hemoglobin 11.0 (L) 13.0 - 17.0 g/dL   HCT 35.8 (L) 39.0 - 52.0 %   MCV 107.8 (H) 78.0 - 100.0 fL   MCH 33.1 26.0 - 34.0 pg   MCHC 30.7 30.0 - 36.0 g/dL   RDW 15.1 11.5 - 15.5 %   Platelets 210 150 - 400 K/uL   Neutrophils Relative % 80 %   Neutro Abs 11.1 (H) 1.7 - 7.7 K/uL   Lymphocytes Relative 17 %   Lymphs Abs 2.3 0.7 - 4.0 K/uL   Monocytes Relative 3 %   Monocytes Absolute 0.4 0.1 - 1.0 K/uL   Eosinophils Relative 0 %   Eosinophils Absolute 0.0 0.0 - 0.7 K/uL   Basophils Relative 0 %   Basophils Absolute 0.0 0.0 - 0.1 K/uL  Basic metabolic panel     Status: Abnormal   Collection Time: 01/09/16  5:55 AM  Result Value Ref Range   Sodium 150 (H) 135 - 145 mmol/L   Potassium 3.4 (L) 3.5 - 5.1 mmol/L   Chloride 112 (H) 101 - 111 mmol/L   CO2 31 22 - 32 mmol/L   Glucose, Bld 176 (H) 65 - 99 mg/dL   BUN 56 (H) 6 - 20 mg/dL   Creatinine, Ser 1.09 0.61 - 1.24 mg/dL   Calcium 8.2 (L) 8.9 - 10.3 mg/dL   GFR  calc non Af Amer >60 >60 mL/min   GFR calc Af Amer >60 >60 mL/min   Anion gap 7 5 - 15     Assessment/Plan:   NEURO  Altered Mental Status:  agitation, delirium and but mostly cooperative   Plan: Wean sedation and try to wean ventilator  PULM  Atelectasis/collapse (focal and RLL) Chest Wall Trauma rib fractures and Pneumothorax (traumatic and with an active air leak)   Plan: If air leak is persistent may need to get CVTS involved.  May be better with being off postitive pressure breathing.  Trach later this week  CARDIO  Atrial Fibrillation (with controlled ventricular response and Wide complex)   Plan: Cardiology involvement  RENAL  Normal urine output Actue Renal Failure (due to hypovolemia/decreased circulating volume)   Plan: Seems to  be better with free water  GI  Distended abdomen which is not tender   Plan: Get KUB, possible feeding tube  ID  Pneumonia (hospital acquired (not ventilator-associated) Haemophilus and E.coli pneumonia)   Plan: Continue Cefepime treatment  HEME  Anemia anemia of critical illness) Leukocytosis (neutrophilia and improved)   Plan: Continue antibiotics and no blood to be given  ENDO Hypernatremia, mild.   Plan: CPM  Global Issues  Will need to talk with the family about tracheostomy this week, poswsible tomorrow.  Would not plan of PEG at the moment, but this can be done at a later time.  I believe that he will wean much better after he has a trach.  Also patient has hypernatremia which we are addressing with free water and changing IVFs.    LOS: 11 days   Additional comments:I reviewed the patient's new clinical lab test results. cbc/bmet and I reviewed the patients new imaging test results. CXR  Critical Care Total Time*: 30 Minutes  Chad Rogers 01/09/2016  *Care during the described time interval was provided by me and/or other providers on the critical care team.  I have reviewed this patient's available data, including medical history, events of note, physical examination and test results as part of my evaluation.

## 2016-01-09 NOTE — Plan of Care (Signed)
Problem: ICU Phase Progression Outcomes Goal: Voiding-avoid urinary catheter unless indicated Outcome: Not Progressing Patient unable to void thus requiring I&O catheterizations. Patient is currently on Urecholine.

## 2016-01-09 NOTE — Progress Notes (Addendum)
RT called to pts bedside. RT bagged and lavaged ETT to remove mucus plug. Pt tol well.

## 2016-01-10 ENCOUNTER — Inpatient Hospital Stay (HOSPITAL_COMMUNITY): Payer: Medicare Other

## 2016-01-10 ENCOUNTER — Encounter (HOSPITAL_COMMUNITY): Payer: Self-pay | Admitting: Certified Registered Nurse Anesthetist

## 2016-01-10 ENCOUNTER — Encounter (HOSPITAL_COMMUNITY): Admission: EM | Disposition: A | Discharge status: Rehabilitation Facility | Payer: Self-pay | Source: Home / Self Care

## 2016-01-10 HISTORY — PX: PERCUTANEOUS TRACHEOSTOMY: SHX5288

## 2016-01-10 LAB — GLUCOSE, CAPILLARY
GLUCOSE-CAPILLARY: 111 mg/dL — AB (ref 65–99)
GLUCOSE-CAPILLARY: 108 mg/dL — AB (ref 65–99)
GLUCOSE-CAPILLARY: 96 mg/dL (ref 65–99)
GLUCOSE-CAPILLARY: 133 mg/dL — AB (ref 65–99)
Glucose-Capillary: 105 mg/dL — ABNORMAL HIGH (ref 65–99)
Glucose-Capillary: 110 mg/dL — ABNORMAL HIGH (ref 65–99)
Glucose-Capillary: 166 mg/dL — ABNORMAL HIGH (ref 65–99)

## 2016-01-10 LAB — CBC WITH DIFFERENTIAL/PLATELET
BASOS ABS: 0 10*3/uL (ref 0.0–0.1)
BASOS PCT: 0 %
Eosinophils Absolute: 0.1 10*3/uL (ref 0.0–0.7)
Eosinophils Relative: 1 %
HEMATOCRIT: 34.2 % — AB (ref 39.0–52.0)
HEMOGLOBIN: 10.2 g/dL — AB (ref 13.0–17.0)
LYMPHS PCT: 17 %
Lymphs Abs: 1.9 10*3/uL (ref 0.7–4.0)
MCH: 32.5 pg (ref 26.0–34.0)
MCHC: 29.8 g/dL — ABNORMAL LOW (ref 30.0–36.0)
MCV: 108.9 fL — ABNORMAL HIGH (ref 78.0–100.0)
MONO ABS: 0.4 10*3/uL (ref 0.1–1.0)
Monocytes Relative: 4 %
Neutro Abs: 9 10*3/uL — ABNORMAL HIGH (ref 1.7–7.7)
Neutrophils Relative %: 78 %
Platelets: 209 10*3/uL (ref 150–400)
RBC: 3.14 MIL/uL — AB (ref 4.22–5.81)
RDW: 15.4 % (ref 11.5–15.5)
WBC: 11.5 10*3/uL — ABNORMAL HIGH (ref 4.0–10.5)

## 2016-01-10 LAB — BASIC METABOLIC PANEL
Anion gap: 4 — ABNORMAL LOW (ref 5–15)
BUN: 47 mg/dL — AB (ref 6–20)
CALCIUM: 8.2 mg/dL — AB (ref 8.9–10.3)
CO2: 33 mmol/L — ABNORMAL HIGH (ref 22–32)
CREATININE: 0.92 mg/dL (ref 0.61–1.24)
Chloride: 115 mmol/L — ABNORMAL HIGH (ref 101–111)
GFR calc Af Amer: >60 mL/min (ref >60)
GLUCOSE: 105 mg/dL — AB (ref 65–99)
POTASSIUM: 3.8 mmol/L (ref 3.5–5.1)
SODIUM: 152 mmol/L — AB (ref 135–145)

## 2016-01-10 LAB — MAGNESIUM: MAGNESIUM: 2.9 mg/dL — AB (ref 1.7–2.4)

## 2016-01-10 LAB — PHOSPHORUS: PHOSPHORUS: 3.6 mg/dL (ref 2.5–4.6)

## 2016-01-10 SURGERY — CREATION, TRACHEOSTOMY, PERCUTANEOUS
Site: Neck

## 2016-01-10 MED ORDER — POTASSIUM CL IN DEXTROSE 5% 20 MEQ/L IV SOLN
20.0000 meq | INTRAVENOUS | Status: DC
  Administered 2016-01-10 – 2016-01-15 (×6): 20 meq via INTRAVENOUS
  Filled 2016-01-10 (×8): qty 1000

## 2016-01-10 MED ORDER — VITAL HIGH PROTEIN PO LIQD: 1000.0000 mL | ORAL | Status: DC

## 2016-01-10 MED ORDER — FENTANYL BOLUS VIA INFUSION
100.0000 ug | Freq: Once | INTRAVENOUS | Status: AC
  Administered 2016-01-10: 100 ug via INTRAVENOUS

## 2016-01-10 MED ORDER — 0.9 % SODIUM CHLORIDE (POUR BTL) OPTIME
TOPICAL | Status: DC | PRN
  Administered 2016-01-10: 1000 mL

## 2016-01-10 MED ORDER — MIDAZOLAM HCL 2 MG/2ML IJ SOLN
INTRAMUSCULAR | Status: AC
  Administered 2016-01-10: 2 mg
  Filled 2016-01-10: qty 2

## 2016-01-10 MED ORDER — PRO-STAT SUGAR FREE PO LIQD
30.0000 mL | Freq: Two times a day (BID) | ORAL | Status: DC
  Administered 2016-01-11: 30 mL
  Filled 2016-01-10: qty 30

## 2016-01-10 MED ORDER — CHLORHEXIDINE GLUCONATE 0.12% ORAL RINSE (MEDLINE KIT)
15.0000 mL | Freq: Two times a day (BID) | OROMUCOSAL | Status: DC
  Administered 2016-01-10 – 2016-01-12 (×3): 15 mL via OROMUCOSAL

## 2016-01-10 MED ORDER — MIDAZOLAM HCL 2 MG/2ML IJ SOLN: 2.0000 mg | Freq: Once | INTRAMUSCULAR | Status: DC

## 2016-01-10 MED ORDER — MIDAZOLAM HCL 2 MG/2ML IJ SOLN
2.0000 mg | Freq: Once | INTRAMUSCULAR | Status: DC
  Filled 2016-01-10: qty 2

## 2016-01-10 MED ORDER — FENTANYL BOLUS VIA INFUSION
50.0000 ug | Freq: Once | INTRAVENOUS | Status: AC
  Administered 2016-01-10: 50 ug via INTRAVENOUS

## 2016-01-10 MED ORDER — LIDOCAINE-EPINEPHRINE (PF) 1.5 %-1:200000 IJ SOLN
INTRAMUSCULAR | Status: DC | PRN
  Administered 2016-01-10: 7 mL via PERINEURAL

## 2016-01-10 MED ORDER — SODIUM CHLORIDE 0.9 % IV BOLUS (SEPSIS)
500.0000 mL | Freq: Once | INTRAVENOUS | Status: AC
  Administered 2016-01-10: 500 mL via INTRAVENOUS

## 2016-01-10 MED ORDER — ANTISEPTIC ORAL RINSE SOLUTION (CORINZ)
7.0000 mL | Freq: Four times a day (QID) | OROMUCOSAL | Status: DC
  Administered 2016-01-11 – 2016-01-12 (×8): 7 mL via OROMUCOSAL

## 2016-01-10 MED ORDER — METOPROLOL TARTRATE 5 MG/5ML IV SOLN
2.5000 mg | Freq: Four times a day (QID) | INTRAVENOUS | Status: DC
  Administered 2016-01-10 – 2016-01-11 (×2): 2.5 mg via INTRAVENOUS
  Filled 2016-01-10 (×2): qty 5

## 2016-01-10 SURGICAL SUPPLY — 31 items
DRAPE PROXIMA HALF (DRAPES) ×8 IMPLANT
DRAPE UTILITY XL STRL (DRAPES) ×3 IMPLANT
ELECT CAUTERY BLADE 6.4 (BLADE) ×3 IMPLANT
ELECT REM PT RETURN 9FT ADLT (ELECTROSURGICAL) ×3
ELECTRODE REM PT RTRN 9FT ADLT (ELECTROSURGICAL) ×1 IMPLANT
GAUZE SPONGE 4X4 16PLY XRAY LF (GAUZE/BANDAGES/DRESSINGS) ×3 IMPLANT
GLOVE BIO SURGEON STRL SZ 6.5 (GLOVE) ×1 IMPLANT
GLOVE BIO SURGEON STRL SZ7 (GLOVE) ×2 IMPLANT
GLOVE BIO SURGEON STRL SZ8 (GLOVE) ×5 IMPLANT
GLOVE BIO SURGEONS STRL SZ 6.5 (GLOVE)
GLOVE BIOGEL PI IND STRL 7.0 (GLOVE) ×1 IMPLANT
GLOVE BIOGEL PI IND STRL 8 (GLOVE) ×1 IMPLANT
GLOVE BIOGEL PI INDICATOR 7.0 (GLOVE) ×4
GLOVE BIOGEL PI INDICATOR 8 (GLOVE) ×6
GLOVE ECLIPSE 7.5 STRL STRAW (GLOVE) ×3 IMPLANT
GOWN STRL REUS W/ TWL LRG LVL3 (GOWN DISPOSABLE) ×1 IMPLANT
GOWN STRL REUS W/ TWL XL LVL3 (GOWN DISPOSABLE) IMPLANT
GOWN STRL REUS W/TWL LRG LVL3 (GOWN DISPOSABLE) ×6
GOWN STRL REUS W/TWL XL LVL3 (GOWN DISPOSABLE) ×3
INTRODUCER TRACH BLUE RHINO 6F (TUBING) ×2 IMPLANT
INTRODUCER TRACH BLUE RHINO 8F (TUBING) IMPLANT
NS IRRIG 1000ML POUR BTL (IV SOLUTION) ×3 IMPLANT
PENCIL BUTTON HOLSTER BLD 10FT (ELECTRODE) ×3 IMPLANT
SPONGE DRAIN TRACH 4X4 STRL 2S (GAUZE/BANDAGES/DRESSINGS) ×2 IMPLANT
SPONGE INTESTINAL PEANUT (DISPOSABLE) ×3 IMPLANT
SUT SILK 2 0 SH CR/8 (SUTURE) ×3 IMPLANT
SUT VICRYL AB 3 0 TIES (SUTURE) ×3 IMPLANT
TOWEL OR 17X26 10 PK STRL BLUE (TOWEL DISPOSABLE) ×3 IMPLANT
TUBE CONNECTING 12'X1/4 (SUCTIONS) ×1
TUBE CONNECTING 12X1/4 (SUCTIONS) ×2 IMPLANT
YANKAUER SUCT BULB TIP NO VENT (SUCTIONS) ×3 IMPLANT

## 2016-01-10 NOTE — Op Note (Signed)
OPERATIVE REPORT  DATE OF OPERATION:  01/10/2016  PATIENT:  Chad Rogers  80 y.o. male  PRE-OPERATIVE DIAGNOSIS:  prolonged intubation  POST-OPERATIVE DIAGNOSIS:  prolonged intubation  FINDINGS:  Normal anatomy  PROCEDURE:  Procedure(s): PERCUTANEOUS TRACHEOSTOMY and BRONCHOSCOPY  SURGEON:  Surgeon(s): Judeth Horn, MD  ASSISTANTJacqulynn Cadet, PA-C  ANESTHESIA:   local and IV sedation  COMPLICATIONS:  None  EBL: <10 ml  BLOOD ADMINISTERED: none  DRAINS: 20 Fr.  Chest Tube(s) in the left pleural space   SPECIMEN:  No Specimen  COUNTS CORRECT:  YES  PROCEDURE DETAILS: Procedure was done at the patient's bedside in the 3 [facility] [place] intensive care unit.  Robert timeout was performed identifying the patient and procedure to be performed. A roll was placed underneath the shoulders and his neck was prepped and draped in usual sterile manner.  We anesthetized the area of the transverse incision just above the sternal notch. This is down with a 25-gauge needle and 1% Xylocaine local solution.  A transverse incision was made using a #15 blade we sharply and bluntly dissected down to the pretracheal fascia using electrocautery, a hemostat clamp, and Metzenbaums.  Once we got down to the space directly on top of the trachea and the pretracheal space we dissected with a sponge tip Kelly clamp. We placed a tracheal hook at the lower portion of the cricoid cartilage, then we pulled back the endotracheal tube proximal to the point of entrance at the second tracheal ring.  An angiocatheter was passed into the tracheal lumen aspirating gas. We subsequently removed the needle of the angiocatheter passed a green wire into the lumen of the trachea going distally. We followed that by serial dilators including the large blue Rhino dilator which is subsequently followed by a #6 Shiley tracheostomy tube over a 26 French introducer guide. This went to the tracheotomy without event however because of  a large amount of secretions and coughing it was difficult to know whether or not the patient was getting adequate tidal volumes. His oxygen saturations never dropped however we bronchoscoped the patient prior to suturing in the tracheostomy tube and found in new tracheostomy tube to be in adequate position from both the proximal part through the endotracheal tube and also directly through the tracheostomy.  Once the tube was confirmed to be in place we did suture in place with 3-0 nylon sutures on both sides of the flanges of the tracheostomy tube. A dressing was applied. All counts were correct.  PATIENT DISPOSITION:  Patient remained in his bed in the 3[facility] ICU stable.  CXR pending   Caelen Zayneb Baucum 8/23/20172:50 PM

## 2016-01-10 NOTE — Care Management Important Message (Signed)
Important Message  Patient Details  Name: Chad Rogers MRN: [id number] Date of Birth: January 20, 1936   Medicare Important Message Given:  Other (see comment)    [place], Alizae Bechtel Abena 01/10/2016, 11:21 AM

## 2016-01-10 NOTE — Brief Op Note (Signed)
01/10/2016  2:39 PM  PATIENT:  Chad Rogers  80 y.o. male  PRE-OPERATIVE DIAGNOSIS:  prolonged intubation  POST-OPERATIVE DIAGNOSIS:  * No post-op diagnosis entered *  PROCEDURE:  Procedure(s): PERCUTANEOUS TRACHEOSTOMY (N/A)  SURGEON:  Surgeon(s) and Role:    * Judeth Horn, MD - Primary  PHYSICIAN ASSISTANT:   ASSISTANTSJacqulynn Cadet, PA-C   ANESTHESIA:   local and IV sedation  EBL:  Total I/O In: 15 [I.V.:15] Out: 275 [Urine:275]  BLOOD ADMINISTERED:none  DRAINS: #6 Shiley tracheostomy tube  Chest Tube(s) in the left pleural space and Feeding tube to be placed   LOCAL MEDICATIONS USED:  LIDOCAINE   SPECIMEN:  No Specimen  DISPOSITION OF SPECIMEN:  N/A  COUNTS:  YES  TOURNIQUET:  * No tourniquets in log *  DICTATION: .Dragon Dictation  PLAN OF CARE: Procedure done at thepatient's bedside in [facility] ICU  PATIENT DISPOSITION:  ICU - intubated and hemodynamically stable.   Delay start of Pharmacological VTE agent (>24hrs) due to surgical blood loss or risk of bleeding: no

## 2016-01-10 NOTE — Progress Notes (Signed)
RT was called to patient beside due to patient having a cuff leak. Patient was trached late today. Patient is getting inspiratory volumes but his expiratory volumes are low. Patient is also having increased RR into 60s. RT tried to bag lavage patient and put air in the cuff with no change. RT tried switching out the vent to a new vent with no change. RN aware and MD notified. MD is ordering more sedation medications first to see if that will help. RT will continue to monitor.

## 2016-01-10 NOTE — Progress Notes (Signed)
Wasted 180cc of Fentanyl with jamie white RN

## 2016-01-10 NOTE — Progress Notes (Signed)
Trauma Service Note  Subjective: Patient very alert and looks very good this AM.  Responsive and weaning on pressure support of 12.  Secretions are better.  Objective: Vital signs in last 24 hours: Temp:  [97.6 F (36.4 C)-100.8 F (38.2 C)] 98.3 F (36.8 C) (08/23 0400) Pulse Rate:  [25-124] 26 (08/23 0600) Resp:  [16-33] 25 (08/23 0700) BP: (80-109)/(54-87) 95/64 (08/23 0700) SpO2:  [86 %-100 %] 100 % (08/23 0700) FiO2 (%):  [40 %] 40 % (08/23 0700) Weight:  [85.4 kg (188 lb 4.4 oz)] 85.4 kg (188 lb 4.4 oz) (08/23 0500) Last BM Date: 01/09/16  Intake/Output from previous day: 08/22 0701 - 08/23 0700 In: 3135.2 [I.V.:1845.2; NG/GT:1140; IV Piggyback:150] Out: [id number] [Urine:1595] Intake/Output this shift: No intake/output data recorded.  General: Doing well.  No acute distress.  Looks very comfortable.  Lungs: Clear.  No air leak.  CXR today shows no pneumothorax.  Less subcutaneous air.  Abd: Soft, good bowel sounds.  Less distended than yesterday.  Extremities: No changes.  Neuro: Intact.  Alert on the ventilator  Lab Results: CBC   Recent Labs  01/09/16 0555 01/10/16 0531  WBC 13.8* 11.5*  HGB 11.0* 10.2*  HCT 35.8* 34.2*  PLT 210 209   BMET  Recent Labs  01/09/16 0555 01/10/16 0531  NA 150* 152*  K 3.4* 3.8  CL 112* 115*  CO2 31 33*  GLUCOSE 176* 105*  BUN 56* 47*  CREATININE 1.09 0.92  CALCIUM 8.2* 8.2*   PT/INR No results for input(s): LABPROT, INR in the last 72 hours. ABG No results for input(s): PHART, HCO3 in the last 72 hours.  Invalid input(s): PCO2, PO2  Studies/Results: Dg Chest Port 1 View  Result Date: 01/10/2016 CLINICAL DATA:  Chest trauma.  Chest tube. EXAM: PORTABLE CHEST 1 VIEW COMPARISON:  01/09/2016. FINDINGS: Endotracheal tube, NG tube, right PICC line, left chest tube in stable position. No pneumothorax. Stable left chest wall subcutaneous emphysema. Cardiomegaly with diffuse pulmonary interstitial infiltrates, progressed  from prior exam. Findings suggest congestive heart failure. Small left pleural effusion. Multiple left rib fractures again noted. IMPRESSION: 1. Lines and tubes including left chest tube in stable position. No pneumothorax. Stable left chest wall subcutaneous emphysema. Multiple displaced left rib fractures again noted. 2. Cardiomegaly with progressive diffuse bilateral pulmonary infiltrates consistent with congestive heart failure with pulmonary edema. Small left pleural effusion. Electronically Signed   By: Marcello Moores  Register   On: 01/10/2016 07:31   Dg Chest Port 1 View  Result Date: 01/09/2016 CLINICAL DATA:  Intubation.  Chest tube. EXAM: PORTABLE CHEST 1 VIEW COMPARISON:  12/19/2015 . FINDINGS: Endotracheal tube, NG tube, right PICC line, left chest tube in stable position. No pneumothorax. Cardiomegaly interim partial clearing from interstitial prominence suggesting clearing congestive heart failure. Small left pleural effusion. No pneumothorax. Left chest wall subcutaneous emphysema again noted. Multiple left-sided rib fractures again noted. IMPRESSION: 1. Lines and tubes including left chest tube in stable position. No pneumothorax. Stable left chest wall subcutaneous emphysema . 2. Cardiomegaly with diffuse bilateral pulmonary interstitial prominence, improved from prior exam. Small left pleural effusion. Findings consistent with improving congestive heart failure. Electronically Signed   By: Marcello Moores  Register   On: 01/09/2016 07:27   Dg Chest Port 1 View  Result Date: 01/08/2016 CLINICAL DATA:  Trauma and respiratory failure. EXAM: PORTABLE CHEST 1 VIEW COMPARISON:  01/07/2016 FINDINGS: Endotracheal tube tip projects approximately 2 cm above the carina. Gastric decompression tube continues to extend below the diaphragm. A  left chest tube shows stable positioning. No pneumothorax identified. Stable to decreased subcutaneous emphysema in the left chest wall. Stable displaced left rib fractures. Slight  increase in right lower lung atelectasis. Stable cardiac enlargement. IMPRESSION: No pneumothorax. Stable to decreased subcutaneous emphysema in the left chest wall without visible pneumothorax. Increase in right lower lung atelectasis. Electronically Signed   By: Aletta Edouard M.D.   On: 01/08/2016 08:52   Dg Abd Portable 1v  Result Date: 01/09/2016 CLINICAL DATA:  Adynamic ileus EXAM: PORTABLE ABDOMEN - 1 VIEW COMPARISON:  01/01/2016 FINDINGS: The nasogastric tube is within the stomach. There is moderate gaseous distension of the stomach. Moderate gaseous distension of the small and large bowel loops are also noted. There is desiccated stool noted within the rectum. IMPRESSION: 1. Again noted is gaseous distension of the stomach and bowel loops compatible with ileus. 2. Desiccated stool within the rectum. Electronically Signed   By: Kerby Moors M.D.   On: 01/09/2016 08:19    Anti-infectives: Anti-infectives    Start     Dose/Rate Route Frequency Ordered Stop   01/08/16 1000  ceFEPIme (MAXIPIME) 2 g in dextrose 5 % 50 mL IVPB     2 g 100 mL/hr over 30 Minutes Intravenous Every 12 hours 01/08/16 0751     01/04/16 1000  piperacillin-tazobactam (ZOSYN) IVPB 3.375 g  Status:  Discontinued     3.375 g 12.5 mL/hr over 240 Minutes Intravenous Every 8 hours 01/04/16 0841 01/08/16 0751      Assessment/Plan: s/p Procedure(s): PERCUTANEOUS TRACHEOSTOMY Patient is weaning well and looks very good this AM on the ventilator.  Will attempt to extubated the patient this AM and if successful will not trach this afternoon.  If he fails and needs to be reintubated, will go ahead with the tracheostomy this afternoon.  LOS: 12 days   Kathryne Eriksson. Dahlia Bailiff, MD, FACS [phone] Trauma Surgeon 01/10/2016

## 2016-01-10 NOTE — Progress Notes (Signed)
Pt HR continues to read 115bpm-130bpm. Unable to give PO meds d/t loss of feeding tube.  MD made aware. Order for 2.5mg  metoprolol IV Q6H. Will continue to monitor.

## 2016-01-11 ENCOUNTER — Encounter (HOSPITAL_COMMUNITY): Payer: Self-pay | Admitting: General Surgery

## 2016-01-11 ENCOUNTER — Inpatient Hospital Stay (HOSPITAL_COMMUNITY): Payer: Medicare Other

## 2016-01-11 LAB — GLUCOSE, CAPILLARY
GLUCOSE-CAPILLARY: 104 mg/dL — AB (ref 65–99)
GLUCOSE-CAPILLARY: 126 mg/dL — AB (ref 65–99)
GLUCOSE-CAPILLARY: 129 mg/dL — AB (ref 65–99)
GLUCOSE-CAPILLARY: 144 mg/dL — AB (ref 65–99)
GLUCOSE-CAPILLARY: 167 mg/dL — AB (ref 65–99)
Glucose-Capillary: 129 mg/dL — ABNORMAL HIGH (ref 65–99)

## 2016-01-11 LAB — POCT I-STAT 3, ART BLOOD GAS (G3+)
Acid-Base Excess: 2 mmol/L (ref 0.0–2.0)
BICARBONATE: 26.1 meq/L — AB (ref 20.0–24.0)
O2 Saturation: 99 %
TCO2: 27 mmol/L (ref 0–100)
pCO2 arterial: 39 mmHg (ref 35.0–45.0)
pH, Arterial: 7.433 (ref 7.350–7.450)
pO2, Arterial: 135 mmHg — ABNORMAL HIGH (ref 80.0–100.0)

## 2016-01-11 LAB — BASIC METABOLIC PANEL
ANION GAP: 6 (ref 5–15)
BUN: 55 mg/dL — ABNORMAL HIGH (ref 6–20)
CALCIUM: 8.3 mg/dL — AB (ref 8.9–10.3)
CO2: 29 mmol/L (ref 22–32)
CREATININE: 1.2 mg/dL (ref 0.61–1.24)
Chloride: 114 mmol/L — ABNORMAL HIGH (ref 101–111)
GFR, EST NON AFRICAN AMERICAN: 57 mL/min — AB (ref >60)
Glucose, Bld: 134 mg/dL — ABNORMAL HIGH (ref 65–99)
Potassium: 4.3 mmol/L (ref 3.5–5.1)
SODIUM: 149 mmol/L — AB (ref 135–145)

## 2016-01-11 LAB — PHOSPHORUS
PHOSPHORUS: 4.2 mg/dL (ref 2.5–4.6)
Phosphorus: 4 mg/dL (ref 2.5–4.6)

## 2016-01-11 LAB — MAGNESIUM
MAGNESIUM: 3 mg/dL — AB (ref 1.7–2.4)
MAGNESIUM: 3 mg/dL — AB (ref 1.7–2.4)

## 2016-01-11 MED ORDER — ALBUMIN HUMAN 5 % IV SOLN
12.5000 g | Freq: Once | INTRAVENOUS | Status: AC
  Administered 2016-01-11: 12.5 g via INTRAVENOUS
  Filled 2016-01-11: qty 250

## 2016-01-11 MED ORDER — DIGOXIN 0.25 MG/ML IJ SOLN
0.1250 mg | Freq: Every day | INTRAMUSCULAR | Status: DC
  Administered 2016-01-11 – 2016-01-12 (×2): 0.125 mg via INTRAVENOUS
  Filled 2016-01-11 (×2): qty 2

## 2016-01-11 MED ORDER — FREE WATER
200.0000 mL | Freq: Three times a day (TID) | Status: DC
  Administered 2016-01-11 – 2016-01-13 (×4): 200 mL

## 2016-01-11 MED ORDER — VITAL AF 1.2 CAL PO LIQD
1000.0000 mL | ORAL | Status: DC
  Administered 2016-01-12 – 2016-01-19 (×11): 1000 mL

## 2016-01-11 MED ORDER — MIDAZOLAM HCL 2 MG/2ML IJ SOLN
1.0000 mg | Freq: Once | INTRAMUSCULAR | Status: AC
Start: 2016-01-11 — End: 2016-01-11
  Administered 2016-01-11: 1 mg via INTRAVENOUS
  Filled 2016-01-11: qty 2

## 2016-01-11 MED ORDER — METOPROLOL TARTRATE 5 MG/5ML IV SOLN
2.5000 mg | Freq: Four times a day (QID) | INTRAVENOUS | Status: DC
  Administered 2016-01-12: 2.5 mg via INTRAVENOUS
  Filled 2016-01-11 (×2): qty 5

## 2016-01-11 NOTE — Progress Notes (Addendum)
~  2350: RT in room assessing pt and addressing this nurse concern for a cuff leak. 2 RT's in room attempting multiple maneuvers/solutions with vent. MD called and notified. RT on phone with MD. Instructed to increase sedation and see if that will help pt.  ~0057: MD called. Updated MD on pt's presentation and vent settings. RR 50, sats 100%. MVe 0.22 and other values adequate. Instructed to get Stat CXR and ABG and call back with updates. MD stated that he would be in surgery. Request if MD would like for this nurse to consult CCM. MD stated "You can have them look at the results since I will not be able to. If you call, someone will answer". Will continue to monitor closely.

## 2016-01-11 NOTE — Progress Notes (Signed)
Patient ID: Chad Rogers, male   DOB: [birth year], 80 y.o.   MRN: [id number] Follow up - Trauma Critical Care  Patient Details:    Chad Rogers is an 80 y.o. male.  Lines/tubes : PICC Triple Lumen [id number] PICC Right Basilic 38 cm 0 cm (Active)  Indication for Insertion or Continuance of Line Prolonged intravenous therapies 01/10/2016  8:00 PM  Exposed Catheter (cm) 0 cm 12/30/2015  3:21 PM  Site Assessment Clean;Dry;Intact 01/10/2016  8:00 PM  Lumen #1 Status Infusing 01/10/2016  8:00 PM  Lumen #2 Status Infusing 01/10/2016  8:00 PM  Lumen #3 Status Saline locked;Flushed 01/10/2016  8:00 PM  Dressing Type Transparent 01/10/2016  8:00 PM  Dressing Status Clean;Dry;Intact;Antimicrobial disc in place 01/10/2016  8:00 PM  Line Care Tubing changed 01/09/2016 11:00 AM  Dressing Intervention Dressing changed 01/05/2016 11:25 PM  Dressing Change Due 01/12/16 01/10/2016  8:00 PM     Chest Tube Left (Active)  Suction -20 cm H2O 01/11/2016  8:00 AM  Chest Tube Air Leak None 01/11/2016  8:00 AM  Patency Intervention Tip/tilt 01/11/2016  8:00 AM  Drainage Description Serous 01/10/2016  7:45 PM  Dressing Status Clean;Dry;Intact 01/11/2016  8:00 AM  Dressing Intervention Dressing reinforced 01/11/2016  8:00 AM  Site Assessment Other (Comment) 01/11/2016  8:00 AM  Surrounding Skin Unable to view 01/10/2016  7:45 PM  Output (mL) 0 mL 01/04/2016  8:00 PM     External Urinary Catheter (Active)  Collection Container Standard drainage bag 01/10/2016  7:45 PM  Output (mL) 0 mL 01/10/2016  8:00 AM    Microbiology/Sepsis markers: Results for orders placed or performed during the hospital encounter of 12/28/15  MRSA PCR Screening     Status: None   Collection Time: 12/29/15  5:06 AM  Result Value Ref Range Status   MRSA by PCR NEGATIVE NEGATIVE Final    Comment:        The GeneXpert MRSA Assay (FDA approved for NASAL specimens only), is one component of a comprehensive MRSA colonization surveillance  program. It is not intended to diagnose MRSA infection nor to guide or monitor treatment for MRSA infections.   Culture, respiratory (NON-Expectorated)     Status: None   Collection Time: 01/03/16 11:55 AM  Result Value Ref Range Status   Specimen Description TRACHEAL ASPIRATE  Final   Special Requests Normal  Final   Gram Stain   Final    ABUNDANT WBC PRESENT, PREDOMINANTLY PMN NO SQUAMOUS EPITHELIAL CELLS SEEN ABUNDANT GRAM NEGATIVE COCCOBACILLI FEW GRAM POSITIVE RODS FEW GRAM POSITIVE COCCI IN PAIRS RARE GRAM NEGATIVE RODS    Culture   Final    ABUNDANT HAEMOPHILUS INFLUENZAE BETA LACTAMASE NEGATIVE MODERATE ESCHERICHIA COLI    Report Status 01/06/2016 FINAL  Final   Organism ID, Bacteria ESCHERICHIA COLI  Final      Susceptibility   Escherichia coli - MIC*    AMPICILLIN <=2 SENSITIVE Sensitive     CEFAZOLIN <=4 SENSITIVE Sensitive     CEFEPIME <=1 SENSITIVE Sensitive     CEFTAZIDIME <=1 SENSITIVE Sensitive     CEFTRIAXONE <=1 SENSITIVE Sensitive     CIPROFLOXACIN <=0.25 SENSITIVE Sensitive     GENTAMICIN <=1 SENSITIVE Sensitive     IMIPENEM <=0.25 SENSITIVE Sensitive     TRIMETH/SULFA <=20 SENSITIVE Sensitive     AMPICILLIN/SULBACTAM <=2 SENSITIVE Sensitive     PIP/TAZO <=4 SENSITIVE Sensitive     Extended ESBL NEGATIVE Sensitive     * MODERATE ESCHERICHIA COLI  Anti-infectives:  Anti-infectives    Start     Dose/Rate Route Frequency Ordered Stop   01/08/16 1000  ceFEPIme (MAXIPIME) 2 g in dextrose 5 % 50 mL IVPB     2 g 100 mL/hr over 30 Minutes Intravenous Every 12 hours 01/08/16 0751     01/04/16 1000  piperacillin-tazobactam (ZOSYN) IVPB 3.375 g  Status:  Discontinued     3.375 g 12.5 mL/hr over 240 Minutes Intravenous Every 8 hours 01/04/16 0841 01/08/16 0751      Best Practice/Protocols:  VTE Prophylaxis: Lovenox (prophylaxtic dose) Intermittent Sedation  Consults: Treatment Team:  Altamese Tetherow, MD Rounding Lbcardiology, MD    Studies:CXR  with trach in place  Subjective:    Overnight Issues: air leak around trach  Objective:  Vital signs for last 24 hours: Temp:  [97.9 F (36.6 C)-99.1 F (37.3 C)] 98.3 F (36.8 C) (08/24 0400) Pulse Rate:  [25-146] 99 (08/24 0705) Resp:  [15-45] 41 (08/24 0705) BP: (75-129)/(58-89) 85/64 (08/24 0705) SpO2:  [98 %-100 %] 100 % (08/24 0705) FiO2 (%):  [40 %-50 %] 50 % (08/24 0800) Weight:  [86.9 kg (191 lb 9.3 oz)] 86.9 kg (191 lb 9.3 oz) (08/24 0400)  Hemodynamic parameters for last 24 hours:    Intake/Output from previous day: 08/23 0701 - 08/24 0700 In: 1756.2 [I.V.:1756.2] Out: 802 [Urine:802]  Intake/Output this shift: Total I/O In: 83.3 [I.V.:83.3] Out: -   Vent settings for last 24 hours: Vent Mode: PRVC FiO2 (%):  [40 %-50 %] 50 % Set Rate:  [18 bmp] 18 bmp Vt Set:  [540 mL] 540 mL PEEP:  [5 cmH20] 5 cmH20 Pressure Support:  [5 cmH20] 5 cmH20 Plateau Pressure:  [16 cmH20-20 cmH20] 19 cmH20  Physical Exam:  Awake on vent Neck - trach with air leak CV - irreg Lungs - rhonchi Neuro - F/C Abd - soft, NT  Results for orders placed or performed during the hospital encounter of 12/28/15 (from the past 24 hour(s))  Glucose, capillary     Status: None   Collection Time: 01/10/16 12:01 PM  Result Value Ref Range   Glucose-Capillary 96 65 - 99 mg/dL   Comment 1 Notify RN    Comment 2 Document in Chart   Glucose, capillary     Status: Abnormal   Collection Time: 01/10/16  4:43 PM  Result Value Ref Range   Glucose-Capillary 133 (H) 65 - 99 mg/dL  Magnesium     Status: Abnormal   Collection Time: 01/10/16  4:50 PM  Result Value Ref Range   Magnesium 2.9 (H) 1.7 - 2.4 mg/dL  Phosphorus     Status: None   Collection Time: 01/10/16  4:50 PM  Result Value Ref Range   Phosphorus 3.6 2.5 - 4.6 mg/dL  Glucose, capillary     Status: Abnormal   Collection Time: 01/10/16  8:21 PM  Result Value Ref Range   Glucose-Capillary 105 (H) 65 - 99 mg/dL  Glucose, capillary      Status: Abnormal   Collection Time: 01/10/16 11:45 PM  Result Value Ref Range   Glucose-Capillary 110 (H) 65 - 99 mg/dL  I-STAT 3, arterial blood gas (G3+)     Status: Abnormal   Collection Time: 01/11/16  1:43 AM  Result Value Ref Range   pH, Arterial 7.433 7.350 - 7.450   pCO2 arterial 39.0 35.0 - 45.0 mmHg   pO2, Arterial 135.0 (H) 80.0 - 100.0 mmHg   Bicarbonate 26.1 (H) 20.0 - 24.0 mEq/L  TCO2 27 0 - 100 mmol/L   O2 Saturation 99.0 %   Acid-Base Excess 2.0 0.0 - 2.0 mmol/L   Patient temperature 98.6 F    Collection site RADIAL, ALLEN'S TEST ACCEPTABLE    Drawn by RT    Sample type ARTERIAL   Glucose, capillary     Status: Abnormal   Collection Time: 01/11/16  4:11 AM  Result Value Ref Range   Glucose-Capillary 129 (H) 65 - 99 mg/dL  Basic metabolic panel     Status: Abnormal   Collection Time: 01/11/16  5:15 AM  Result Value Ref Range   Sodium 149 (H) 135 - 145 mmol/L   Potassium 4.3 3.5 - 5.1 mmol/L   Chloride 114 (H) 101 - 111 mmol/L   CO2 29 22 - 32 mmol/L   Glucose, Bld 134 (H) 65 - 99 mg/dL   BUN 55 (H) 6 - 20 mg/dL   Creatinine, Ser 1.20 0.61 - 1.24 mg/dL   Calcium 8.3 (L) 8.9 - 10.3 mg/dL   GFR calc non Af Amer 57 (L) >60 mL/min   GFR calc Af Amer >60 >60 mL/min   Anion gap 6 5 - 15  Magnesium     Status: Abnormal   Collection Time: 01/11/16  5:15 AM  Result Value Ref Range   Magnesium 3.0 (H) 1.7 - 2.4 mg/dL  Phosphorus     Status: None   Collection Time: 01/11/16  5:15 AM  Result Value Ref Range   Phosphorus 4.2 2.5 - 4.6 mg/dL    Assessment & Plan: Present on Admission: . Traumatic fracture of ribs with pneumothorax . Multiple rib fractures    LOS: 13 days   Additional comments:I reviewed the patient's new clinical lab test results. and CXR Fall Left sided rib fractures 3 through 12 with small pneumothorax- L CT with intermittent air leak - continue CT to suction L3 and L4 transverse process fracture Left acetabular fracture- WBAT per  Dr. Gena Fray dependent resp failure/COPD - S/P trach 8/23 with leak around it. Improves partially with repositioning. Will change to long proximal today.  CHF ID - cefepime for H flu and e coli HCAP  Vascular insufficiency LLE - Dr. Bridgett Larsson has consulted FEN - place Cortrak, resume TF, add free water for hypernatremia ETOH abuse Critical Care Total Time*: 45 Minutes  Georganna Skeans, MD, MPH, FACS Trauma: [phone] General Surgery: [phone]  01/11/2016  *Care during the described time interval was provided by me. I have reviewed this patient's available data, including medical history, events of note, physical examination and test results as part of my evaluation.

## 2016-01-11 NOTE — Progress Notes (Addendum)
Called to update MD on pt's CXR and ABG results as well as RT's current bag lavage maneuvers. Instructed through Claiborne Billings, RN that MD was fine with results and continue to monitor til morning. This RN requested to have MD call this nurse for update on Pt after surgery. OR nurse acknowledged.  ~0206Claiborne Billings, RN called back and stated that MD would like to have CCM camera in room on pt. Verified. ~0212: Called Dr. Stevenson Clinch with CCM and requested to look in on pt. MD on phone with Christy Sartorius, RT. MD ordered 1mg  versed IV once to help decrease respiratory rate.

## 2016-01-11 NOTE — Progress Notes (Signed)
Nutrition Follow-up  INTERVENTION:  Initiate TF via Cortrak NGT with Vital AF 1.2 at goal rate of 70 ml/h (1680 ml per day) to provide 2016 kcals, 126 gm protein, 1361 ml free water daily.   NUTRITION DIAGNOSIS:   Inadequate oral intake related to inability to eat as evidenced by NPO status.  Ongoing  GOAL:   Patient will meet greater than or equal to 90% of their needs  Unmet  MONITOR:   TF tolerance, Skin, I & O's, Labs  REASON FOR ASSESSMENT:   Consult Enteral/tube feeding initiation and management  ASSESSMENT:   Pt with hx of COPD and ETOH who was admitted s/p fall with left sided rib fxs 3-12 with small pneumothorax (CT placed 8/12), L3 and L4 transverse process fx, left acetabular fx, and SIADH.   New consult for TF management. Pt is s/p tracheostomy on vent support. Cortrak tube place via nose earlier today. Per RN, pt needs free water via feeding tube due to hypernatremia.   Labs: elevated magnesium, elevated sodium, elevated chloride, low calcium  Diet Order:  Diet NPO time specified  Skin:  Wound (see comment) (closed incision on neck)  Last BM:  8/23  Height:   Ht Readings from Last 1 Encounters:  12/29/15 5\' 10"  (1.778 m)    Weight:   Wt Readings from Last 1 Encounters:  01/11/16 191 lb 9.3 oz (86.9 kg)    Ideal Body Weight:  75.4 kg  BMI:  Body mass index is 27.49 kg/m.  Estimated Nutritional Needs:   Kcal:  1900-2100  Protein:  110-125 grams  Fluid:  1.9-2.1 L/day  EDUCATION NEEDS:   No education needs identified at this time  Scarlette Ar RD, LDN, CSP Inpatient Clinical Dietitian Pager: [phone] After Hours Pager: [phone]

## 2016-01-11 NOTE — Progress Notes (Signed)
Patient ID: Chad Rogers, male   DOB: 1935/07/24, 80 y.o.   MRN: [id number] I changed his trach to a #6 long proximal over a bougie. He tolerated it well. + color change. Now getting full volume returns. WIll check CXR. BP lower, will give albumin bolus.  Georganna Skeans, MD, MPH, FACS Trauma: [phone] General Surgery: [phone]

## 2016-01-11 NOTE — Progress Notes (Signed)
This RN to the bedside to place cortrak feeding tube. Unable to advance tube past 55cm. Resistance was met and the tube began to coil. Pt becoming distressed. Procedure stopped after 37mn. Primary nurse updated.

## 2016-01-11 NOTE — Progress Notes (Signed)
Pt s/p tracheostomy on 01/10/16.  Trach changed to #6 long proximal this AM.  Receiving Fentanyl as needed for sedation; continues on amiodarone drip; chest tube to suction.  Will continue to follow for discharge planning.  Needs PT/OT evaluations when able to tolerate therapies.    Reinaldo Raddle, RN, BSN  Trauma/Neuro ICU Case Manager [phone]

## 2016-01-11 NOTE — Progress Notes (Signed)
    Subjective:  S/P tracheostomy; sedated   Objective:  Vitals:   01/11/16 1101 01/11/16 1130 01/11/16 1200 01/11/16 1230  BP: (!) 81/59 (!) 87/62 (!) 82/72 (!) 82/59  Pulse: (!) 112 99 (!) 124 (!) 101  Resp: 19 18 18 18   Temp:   98.4 F (36.9 C)   TempSrc:   Axillary   SpO2: 100% 100% 100% 100%  Weight:      Height:        Intake/Output from previous day:  Intake/Output Summary (Last 24 hours) at 01/11/16 1259 Last data filed at 01/11/16 1200  Gross per 24 hour  Intake          2098.61 ml  Output              527 ml  Net          1571.61 ml    Physical Exam: Physical exam: Well-developed, ill appearing Skin is warm and dry.  HEENT is normal.  Chest CTA anteriorly Cardiovascular exam is irregular and tachycardic Abdominal exam No masses palpated. Extremities show 1+ edema. neuro not assessed    Lab Results: Basic Metabolic Panel:  Recent Labs  01/10/16 0531 01/10/16 1650 01/11/16 0515  NA 152*  --  149*  K 3.8  --  4.3  CL 115*  --  114*  CO2 33*  --  29  GLUCOSE 105*  --  134*  BUN 47*  --  55*  CREATININE 0.92  --  1.20  CALCIUM 8.2*  --  8.3*  MG  --  2.9* 3.0*  PHOS  --  3.6 4.2   CBC:  Recent Labs  01/09/16 0555 01/10/16 0531  WBC 13.8* 11.5*  NEUTROABS 11.1* 9.0*  HGB 11.0* 10.2*  HCT 35.8* 34.2*  MCV 107.8* 108.9*  PLT 210 209     Assessment/Plan:  1 Atrial fibrillation-continue IV amiodarone but decrease to 30 mg/hr for rate control; hypotensive; hold metoprolol for SBP < 95; will give digoxin; can consider initiating anticoagulation later as he improves (possible short term for anticoagulation). 2 NICM-will try and adjust meds after he is extubated as BP allows. 3 Rib fxs and trauma-per surgery 4 chronic systolic CHF-follow exam and diurese as needed 5 PVD-fu vascular surgery  [name] 01/11/2016, 12:59 PM

## 2016-01-11 NOTE — Progress Notes (Signed)
~  [id number]: Dr. Donne Hazel in room assessing pt. Manipulating the trach. Updated MD on CCM orders and further RT attempts. MD stated that pt may need another trach in am by rounding team when more hands are available and for this RN to increase sedation as tolerated. This RN request new BP, HR parameters. MAP goal>60 and MD will put order in for metoprolol and sedation.  This RN updated RT. Will continue to monitor closely.

## 2016-01-11 NOTE — Progress Notes (Signed)
RT unable to obtain accurate Vent check at this time. Patient is in no distress sat 100% on 40% FIO2. MD agreed that patient may need a trach change but he doesn't have any hands tonight to help him. He stated another trach needed to be place by rounding team in the morning when more hands are available. Patient ventilator continues to read low MV and low Expiatory volumes because of massive cuff leak. MD and RN aware.

## 2016-01-11 NOTE — Progress Notes (Signed)
RT is unable to obtain an accurate vent check at this time due to patient trach leaking. RN and MD is aware of massive cuff leak. Patient is in no distress patient sat 100% on 40% FIO2. RT will continue to monitor.

## 2016-01-12 ENCOUNTER — Inpatient Hospital Stay (HOSPITAL_COMMUNITY): Payer: Medicare Other

## 2016-01-12 LAB — BASIC METABOLIC PANEL
ANION GAP: 8 (ref 5–15)
BUN: 60 mg/dL — AB (ref 6–20)
CHLORIDE: 112 mmol/L — AB (ref 101–111)
CO2: 29 mmol/L (ref 22–32)
CREATININE: 1.18 mg/dL (ref 0.61–1.24)
Calcium: 8.4 mg/dL — ABNORMAL LOW (ref 8.9–10.3)
GFR, EST NON AFRICAN AMERICAN: 58 mL/min — AB (ref >60)
Glucose, Bld: 116 mg/dL — ABNORMAL HIGH (ref 65–99)
POTASSIUM: 4 mmol/L (ref 3.5–5.1)
SODIUM: 149 mmol/L — AB (ref 135–145)

## 2016-01-12 LAB — CBC
HEMATOCRIT: 35.6 % — AB (ref 39.0–52.0)
HEMOGLOBIN: 10.7 g/dL — AB (ref 13.0–17.0)
MCH: 33.1 pg (ref 26.0–34.0)
MCHC: 30.1 g/dL (ref 30.0–36.0)
MCV: 110.2 fL — ABNORMAL HIGH (ref 78.0–100.0)
Platelets: 217 10*3/uL (ref 150–400)
RBC: 3.23 MIL/uL — AB (ref 4.22–5.81)
RDW: 15.5 % (ref 11.5–15.5)
WBC: 10.6 10*3/uL — AB (ref 4.0–10.5)

## 2016-01-12 LAB — GLUCOSE, CAPILLARY
GLUCOSE-CAPILLARY: 104 mg/dL — AB (ref 65–99)
GLUCOSE-CAPILLARY: 120 mg/dL — AB (ref 65–99)
GLUCOSE-CAPILLARY: 154 mg/dL — AB (ref 65–99)
GLUCOSE-CAPILLARY: 131 mg/dL — AB (ref 65–99)
Glucose-Capillary: 127 mg/dL — ABNORMAL HIGH (ref 65–99)
Glucose-Capillary: 135 mg/dL — ABNORMAL HIGH (ref 65–99)

## 2016-01-12 MED ORDER — ORAL CARE MOUTH RINSE
15.0000 mL | Freq: Four times a day (QID) | OROMUCOSAL | Status: DC
  Administered 2016-01-13 – 2016-01-19 (×27): 15 mL via OROMUCOSAL

## 2016-01-12 MED ORDER — AMIODARONE HCL 200 MG PO TABS
200.0000 mg | ORAL_TABLET | Freq: Every day | ORAL | Status: DC
  Administered 2016-01-12: 200 mg via ORAL
  Filled 2016-01-12: qty 1

## 2016-01-12 MED ORDER — DIGOXIN 125 MCG PO TABS
0.1250 mg | ORAL_TABLET | Freq: Every day | ORAL | Status: DC
  Administered 2016-01-13 – 2016-01-19 (×7): 0.125 mg via ORAL
  Filled 2016-01-12 (×8): qty 1

## 2016-01-12 MED ORDER — FUROSEMIDE 10 MG/ML IJ SOLN
40.0000 mg | Freq: Every day | INTRAMUSCULAR | Status: DC
  Administered 2016-01-12: 40 mg via INTRAVENOUS
  Filled 2016-01-12: qty 4

## 2016-01-12 MED ORDER — METOPROLOL TARTRATE 25 MG PO TABS
12.5000 mg | ORAL_TABLET | Freq: Two times a day (BID) | ORAL | Status: DC
  Administered 2016-01-12 – 2016-01-19 (×14): 12.5 mg via ORAL
  Filled 2016-01-12 (×15): qty 1

## 2016-01-12 MED ORDER — CHLORHEXIDINE GLUCONATE 0.12 % MT SOLN
OROMUCOSAL | Status: AC
  Filled 2016-01-12: qty 15

## 2016-01-12 MED ORDER — CHLORHEXIDINE GLUCONATE 0.12 % MT SOLN
15.0000 mL | Freq: Two times a day (BID) | OROMUCOSAL | Status: DC
  Administered 2016-01-12 – 2016-01-19 (×14): 15 mL via OROMUCOSAL
  Filled 2016-01-12 (×9): qty 15

## 2016-01-12 MED ORDER — HYDROCODONE-ACETAMINOPHEN 7.5-325 MG/15ML PO SOLN
15.0000 mL | ORAL | Status: DC | PRN
  Administered 2016-01-12 – 2016-01-19 (×19): 15 mL
  Filled 2016-01-12 (×19): qty 15

## 2016-01-12 NOTE — Progress Notes (Signed)
Helping unit RT, RN called d/t pt tachycardic and tachypnea on ATC.  Placed pt on cp/ps PS 15, pt appears to be tol wel currently.  No distress noted.  Unit RT aware.

## 2016-01-12 NOTE — Progress Notes (Signed)
Patient ID: Chad Rogers, male   DOB: 1936-01-12, 79 y.o.   MRN: [id number] Follow up - Trauma Critical Care  Patient Details:    Chad Rogers is an 80 y.o. male.  Lines/tubes : PICC Triple Lumen [id number] PICC Right Basilic 38 cm 0 cm (Active)  Indication for Insertion or Continuance of Line Prolonged intravenous therapies 01/11/2016  8:00 PM  Exposed Catheter (cm) 0 cm 12/30/2015  3:21 PM  Site Assessment Clean;Dry;Intact 01/11/2016  8:00 PM  Lumen #1 Status Infusing 01/11/2016  8:00 PM  Lumen #2 Status Infusing 01/11/2016  8:00 PM  Lumen #3 Status Saline locked;Flushed 01/11/2016  8:00 PM  Dressing Type Transparent 01/11/2016  8:00 PM  Dressing Status Clean;Dry;Intact;Antimicrobial disc in place 01/11/2016  8:00 PM  Line Care Tubing changed 01/09/2016 11:00 AM  Dressing Intervention Dressing changed 01/05/2016 11:25 PM  Dressing Change Due 01/12/16 01/11/2016  8:00 PM     Chest Tube Left (Active)  Suction -20 cm H2O 01/12/2016 12:00 AM  Chest Tube Air Leak Minimal 01/12/2016 12:00 AM  Patency Intervention Tip/tilt 01/11/2016  8:00 AM  Drainage Description Serous 01/10/2016  7:45 PM  Dressing Status Clean;Dry;Intact 01/12/2016 12:00 AM  Dressing Intervention Dressing reinforced 01/11/2016  8:00 AM  Site Assessment Other (Comment) 01/11/2016  8:00 PM  Surrounding Skin Unable to view 01/10/2016  7:45 PM  Output (mL) 0 mL 01/04/2016  8:00 PM    Microbiology/Sepsis markers: Results for orders placed or performed during the hospital encounter of 12/28/15  MRSA PCR Screening     Status: None   Collection Time: 12/29/15  5:06 AM  Result Value Ref Range Status   MRSA by PCR NEGATIVE NEGATIVE Final    Comment:        The GeneXpert MRSA Assay (FDA approved for NASAL specimens only), is one component of a comprehensive MRSA colonization surveillance program. It is not intended to diagnose MRSA infection nor to guide or monitor treatment for MRSA infections.   Culture, respiratory  (NON-Expectorated)     Status: None   Collection Time: 01/03/16 11:55 AM  Result Value Ref Range Status   Specimen Description TRACHEAL ASPIRATE  Final   Special Requests Normal  Final   Gram Stain   Final    ABUNDANT WBC PRESENT, PREDOMINANTLY PMN NO SQUAMOUS EPITHELIAL CELLS SEEN ABUNDANT GRAM NEGATIVE COCCOBACILLI FEW GRAM POSITIVE RODS FEW GRAM POSITIVE COCCI IN PAIRS RARE GRAM NEGATIVE RODS    Culture   Final    ABUNDANT HAEMOPHILUS INFLUENZAE BETA LACTAMASE NEGATIVE MODERATE ESCHERICHIA COLI    Report Status 01/06/2016 FINAL  Final   Organism ID, Bacteria ESCHERICHIA COLI  Final      Susceptibility   Escherichia coli - MIC*    AMPICILLIN <=2 SENSITIVE Sensitive     CEFAZOLIN <=4 SENSITIVE Sensitive     CEFEPIME <=1 SENSITIVE Sensitive     CEFTAZIDIME <=1 SENSITIVE Sensitive     CEFTRIAXONE <=1 SENSITIVE Sensitive     CIPROFLOXACIN <=0.25 SENSITIVE Sensitive     GENTAMICIN <=1 SENSITIVE Sensitive     IMIPENEM <=0.25 SENSITIVE Sensitive     TRIMETH/SULFA <=20 SENSITIVE Sensitive     AMPICILLIN/SULBACTAM <=2 SENSITIVE Sensitive     PIP/TAZO <=4 SENSITIVE Sensitive     Extended ESBL NEGATIVE Sensitive     * MODERATE ESCHERICHIA COLI    Anti-infectives:  Anti-infectives    Start     Dose/Rate Route Frequency Ordered Stop   01/08/16 1000  ceFEPIme (MAXIPIME) 2 g in dextrose 5 %  50 mL IVPB     2 g 100 mL/hr over 30 Minutes Intravenous Every 12 hours 01/08/16 0751     01/04/16 1000  piperacillin-tazobactam (ZOSYN) IVPB 3.375 g  Status:  Discontinued     3.375 g 12.5 mL/hr over 240 Minutes Intravenous Every 8 hours 01/04/16 0841 01/08/16 0751      Best Practice/Protocols:  VTE Prophylaxis: Lovenox (prophylaxtic dose) Continous Sedation  Consults: Treatment Team:  Altamese Mecosta, MD Rounding Lbcardiology, MD   Subjective:    Overnight Issues: cortrak replaced this AM  Objective:  Vital signs for last 24 hours: Temp:  [98.2 F (36.8 C)-99.2 F (37.3 C)]  98.2 F (36.8 C) (08/25 0400) Pulse Rate:  [30-142] 117 (08/25 0700) Resp:  [16-34] 21 (08/25 0600) BP: (70-123)/(57-98) 91/74 (08/25 0700) SpO2:  [99 %-100 %] 100 % (08/25 0700) FiO2 (%):  [40 %-50 %] 40 % (08/25 0302) Weight:  [88.6 kg (195 lb 5.2 oz)] 88.6 kg (195 lb 5.2 oz) (08/25 0500)  Hemodynamic parameters for last 24 hours:    Intake/Output from previous day: 08/24 0701 - 08/25 0700 In: 2713 [I.V.:1833; NG/GT:680; IV Piggyback:200] Out: 580 [Urine:580]  Intake/Output this shift: No intake/output data recorded.  Vent settings for last 24 hours: Vent Mode: PRVC FiO2 (%):  [40 %-50 %] 40 % Set Rate:  [18 bmp] 18 bmp Vt Set:  [540 mL] 540 mL PEEP:  [5 cmH20] 5 cmH20 Plateau Pressure:  [15 [id number] cmH20] 19 cmH20  Physical Exam:  General: on vent Neuro: F/C HEENT/Neck: trach-clean, intact Resp: some wheeze on R CVS: IRR GI: soft, NT, +BS Extremities: no edema, no erythema, pulses WNL and edema 2+  Results for orders placed or performed during the hospital encounter of 12/28/15 (from the past 24 hour(s))  Glucose, capillary     Status: Abnormal   Collection Time: 01/11/16  8:47 AM  Result Value Ref Range   Glucose-Capillary 126 (H) 65 - 99 mg/dL   Comment 1 Notify RN    Comment 2 Document in Chart   Glucose, capillary     Status: Abnormal   Collection Time: 01/11/16 11:36 AM  Result Value Ref Range   Glucose-Capillary 129 (H) 65 - 99 mg/dL   Comment 1 Notify RN    Comment 2 Document in Chart   Glucose, capillary     Status: Abnormal   Collection Time: 01/11/16  4:12 PM  Result Value Ref Range   Glucose-Capillary 167 (H) 65 - 99 mg/dL   Comment 1 Notify RN    Comment 2 Document in Chart   Magnesium     Status: Abnormal   Collection Time: 01/11/16  4:27 PM  Result Value Ref Range   Magnesium 3.0 (H) 1.7 - 2.4 mg/dL  Phosphorus     Status: None   Collection Time: 01/11/16  4:27 PM  Result Value Ref Range   Phosphorus 4.0 2.5 - 4.6 mg/dL  Glucose,  capillary     Status: Abnormal   Collection Time: 01/11/16  8:09 PM  Result Value Ref Range   Glucose-Capillary 144 (H) 65 - 99 mg/dL  Glucose, capillary     Status: Abnormal   Collection Time: 01/11/16 11:36 PM  Result Value Ref Range   Glucose-Capillary 104 (H) 65 - 99 mg/dL  Glucose, capillary     Status: Abnormal   Collection Time: 01/12/16  3:57 AM  Result Value Ref Range   Glucose-Capillary 104 (H) 65 - 99 mg/dL  CBC     Status:  Abnormal   Collection Time: 01/12/16  4:50 AM  Result Value Ref Range   WBC 10.6 (H) 4.0 - 10.5 K/uL   RBC 3.23 (L) 4.22 - 5.81 MIL/uL   Hemoglobin 10.7 (L) 13.0 - 17.0 g/dL   HCT 35.6 (L) 39.0 - 52.0 %   MCV 110.2 (H) 78.0 - 100.0 fL   MCH 33.1 26.0 - 34.0 pg   MCHC 30.1 30.0 - 36.0 g/dL   RDW 15.5 11.5 - 15.5 %   Platelets 217 150 - 400 K/uL  Basic metabolic panel     Status: Abnormal   Collection Time: 01/12/16  4:50 AM  Result Value Ref Range   Sodium 149 (H) 135 - 145 mmol/L   Potassium 4.0 3.5 - 5.1 mmol/L   Chloride 112 (H) 101 - 111 mmol/L   CO2 29 22 - 32 mmol/L   Glucose, Bld 116 (H) 65 - 99 mg/dL   BUN 60 (H) 6 - 20 mg/dL   Creatinine, Ser 1.18 0.61 - 1.24 mg/dL   Calcium 8.4 (L) 8.9 - 10.3 mg/dL   GFR calc non Af Amer 58 (L) >60 mL/min   GFR calc Af Amer >60 >60 mL/min   Anion gap 8 5 - 15  Glucose, capillary     Status: Abnormal   Collection Time: 01/12/16  7:33 AM  Result Value Ref Range   Glucose-Capillary 127 (H) 65 - 99 mg/dL    Assessment & Plan: Present on Admission: . Traumatic fracture of ribs with pneumothorax . Multiple rib fractures    LOS: 14 days   Additional comments:I reviewed the patient's new clinical lab test results. . Fall Left sided rib fractures 3 through 12 with small pneumothorax- L CT with intermittent air leak - continue CT to suction L3 and L4 transverse process fracture Left acetabular fracture- WBAT per Dr. Marcelino Scot  Vent dependent resp failure/COPD - changed trach to long proximal  yesterday and it is working well, wean CHF/AF - appreciate cardiology F/U, amio and digoxin ID - cefepime for H flu and e coli HCAP  Vascular insufficiency LLE - Dr. Bridgett Larsson has consulted FEN - TF, continue free water for hypernatremia ETOH abuse Dispo - ICU Critical Care Total Time*: 40 Minutes  Georganna Skeans, MD, MPH, FACS Trauma: [phone] General Surgery: [phone]  01/12/2016  *Care during the described time interval was provided by me. I have reviewed this patient's available data, including medical history, events of note, physical examination and test results as part of my evaluation.
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
## 2016-01-13 ENCOUNTER — Inpatient Hospital Stay (HOSPITAL_COMMUNITY): Payer: Medicare Other

## 2016-01-13 LAB — BASIC METABOLIC PANEL
Anion gap: 5 (ref 5–15)
BUN: 50 mg/dL — ABNORMAL HIGH (ref 6–20)
CALCIUM: 8.4 mg/dL — AB (ref 8.9–10.3)
CO2: 32 mmol/L (ref 22–32)
CREATININE: 0.99 mg/dL (ref 0.61–1.24)
Chloride: 113 mmol/L — ABNORMAL HIGH (ref 101–111)
GFR calc non Af Amer: >60 mL/min (ref >60)
Glucose, Bld: 108 mg/dL — ABNORMAL HIGH (ref 65–99)
Potassium: 3.9 mmol/L (ref 3.5–5.1)
SODIUM: 150 mmol/L — AB (ref 135–145)

## 2016-01-13 LAB — GLUCOSE, CAPILLARY
GLUCOSE-CAPILLARY: 113 mg/dL — AB (ref 65–99)
GLUCOSE-CAPILLARY: 133 mg/dL — AB (ref 65–99)
Glucose-Capillary: 128 mg/dL — ABNORMAL HIGH (ref 65–99)
Glucose-Capillary: 169 mg/dL — ABNORMAL HIGH (ref 65–99)
Glucose-Capillary: 132 mg/dL — ABNORMAL HIGH (ref 65–99)
Glucose-Capillary: 139 mg/dL — ABNORMAL HIGH (ref 65–99)

## 2016-01-13 MED ORDER — FREE WATER
200.0000 mL | Status: DC
  Administered 2016-01-13 – 2016-01-17 (×23): 200 mL

## 2016-01-13 MED ORDER — FUROSEMIDE 10 MG/ML IJ SOLN
40.0000 mg | Freq: Two times a day (BID) | INTRAMUSCULAR | Status: DC
  Administered 2016-01-13 – 2016-01-19 (×13): 40 mg via INTRAVENOUS
  Filled 2016-01-13 (×14): qty 4

## 2016-01-13 MED ORDER — AMIODARONE HCL 200 MG PO TABS
400.0000 mg | ORAL_TABLET | Freq: Two times a day (BID) | ORAL | Status: DC
  Administered 2016-01-13 – 2016-01-19 (×13): 400 mg via ORAL
  Filled 2016-01-13 (×13): qty 2
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
## 2016-01-13 NOTE — Progress Notes (Signed)
RT Note: Pt placed back on vent PS 5/15 due to increased WOB, inc RR >35, pt tolerating PS well, states that he feels more comfortable, RT will monitor.

## 2016-01-13 NOTE — Progress Notes (Signed)
Trauma Service Note  Subjective: On trach collar during day yesterday  Objective: Vital signs in last 24 hours: Temp:  [97.7 F (36.5 C)-98.9 F (37.2 C)] 98.9 F (37.2 C) (08/26 0800) Pulse Rate:  [30-126] 103 (08/26 0854) Resp:  [14-31] 24 (08/26 0854) BP: (81-132)/(52-91) 118/76 (08/26 0854) SpO2:  [95 %-100 %] 100 % (08/26 0854) FiO2 (%):  [40 %] 40 % (08/26 0854) Weight:  [89.3 kg (196 lb 13.9 oz)] 89.3 kg (196 lb 13.9 oz) (08/26 0500) Last BM Date: 01/11/16  Intake/Output from previous day: 08/25 0701 - 08/26 0700 In: 3461 [I.V.:1391; NG/GT:2070] Out: 1365 [Urine:1325; Chest Tube:40] Intake/Output this shift: Total I/O In: 120 [I.V.:50; NG/GT:70] Out: -   General: alert  Lungs: tachypneic, CTAB, trach in place  Abd: soft, NT, ND  Extremities: no edema  Neuro: alert, GCS 10t  Lab Results: CBC   Recent Labs  01/12/16 0450  WBC 10.6*  HGB 10.7*  HCT 35.6*  PLT 217   BMET  Recent Labs  01/12/16 0450 01/13/16 0630  NA 149* 150*  K 4.0 3.9  CL 112* 113*  CO2 29 32  GLUCOSE 116* 108*  BUN 60* 50*  CREATININE 1.18 0.99  CALCIUM 8.4* 8.4*   PT/INR No results for input(s): LABPROT, INR in the last 72 hours. ABG  Recent Labs  01/11/16 0143  PHART 7.433  HCO3 26.1*    Studies/Results: Dg Chest Port 1 View  Result Date: 01/13/2016 CLINICAL DATA:  Multiple rib fractures on the left. EXAM: PORTABLE CHEST 1 VIEW COMPARISON:  01/12/2016 FINDINGS: Tracheostomy tube, right PICC line and left chest tube remain in place, unchanged. No pneumothorax. Cardiomegaly. Bibasilar atelectasis and mild vascular congestion. Small layering left effusion. Multiple left rib fractures again noted. IMPRESSION: No significant change.  Cardiomegaly with vascular congestion. Bibasilar atelectasis.  Smaller in left effusion. Multiple left rib fractures. Electronically Signed   By: Rolm Baptise M.D.   On: 01/13/2016 07:15    Anti-infectives: Anti-infectives    Start      Dose/Rate Route Frequency Ordered Stop   01/08/16 1000  ceFEPIme (MAXIPIME) 2 g in dextrose 5 % 50 mL IVPB     2 g 100 mL/hr over 30 Minutes Intravenous Every 12 hours 01/08/16 0751     01/04/16 1000  piperacillin-tazobactam (ZOSYN) IVPB 3.375 g  Status:  Discontinued     3.375 g 12.5 mL/hr over 240 Minutes Intravenous Every 8 hours 01/04/16 0841 01/08/16 0751      Medications Scheduled Meds: . amiodarone  200 mg Oral Daily  . bethanechol  10 mg Per Tube TID  . ceFEPime (MAXIPIME) IV  2 g Intravenous Q12H  . chlorhexidine  15 mL Mouth/Throat BID  . digoxin  0.125 mg Oral Daily  . enoxaparin (LOVENOX) injection  40 mg Subcutaneous Q24H  . famotidine (PEPCID) IV  20 mg Intravenous Q24H  . folic acid  1 mg Oral Daily  . free water  200 mL Per Tube Q4H  . furosemide  40 mg Intravenous Daily  . insulin aspart  0-15 Units Subcutaneous Q4H  . mouth rinse  15 mL Mouth Rinse QID  . metoprolol tartrate  12.5 mg Oral BID  . midazolam  2 mg Intravenous Once  . midazolam  2 mg Intravenous Once  . multivitamin with minerals  1 tablet Oral Daily  . sertraline  50 mg Oral Daily  . sodium chloride flush  10-40 mL Intracatheter Q12H  . thiamine  100 mg Oral Daily  Or  . thiamine  100 mg Intravenous Daily   Continuous Infusions: . dextrose 5 % with KCl 20 mEq / L 20 mEq (01/13/16 0641)  . feeding supplement (VITAL AF 1.2 CAL) 1,000 mL (01/13/16 0200)  . fentaNYL infusion INTRAVENOUS Stopped (01/13/16 0000)   PRN Meds:.acetaminophen, ALPRAZolam, artificial tears, bisacodyl, HYDROcodone-acetaminophen, HYDROmorphone (DILAUDID) injection, ipratropium, levalbuterol, ondansetron **OR** ondansetron (ZOFRAN) IV, sodium chloride flush  Assessment/Plan: s/p Procedure(s): PERCUTANEOUS TRACHEOSTOMY Fall Left sided rib fractures 3 through 12 with small pneumothorax- L Ct to water seal, no PTX seen, low left diaphram L3 and L4 transverse process fracture Left acetabular fracture- WBAT per Dr. Marcelino Scot   Vent dependent resp failure/COPD - changed trach to long proximal yesterday and it is working well, wean -continue TCT CHF/AF - appreciate cardiology F/U, amio and digoxin ID - cefepime for H flu and e coli HCAP  Vascular insufficiency LLE - Dr. Bridgett Larsson has consulted FEN - TF -increase free water to 220ml q4h ETOH abuse Dispo - ICU   LOS: 15 days   [place] Trauma Surgeon [phone] Surgery 01/13/2016

## 2016-01-14 ENCOUNTER — Inpatient Hospital Stay (HOSPITAL_COMMUNITY): Payer: Medicare Other

## 2016-01-14 LAB — GLUCOSE, CAPILLARY
GLUCOSE-CAPILLARY: 119 mg/dL — AB (ref 65–99)
GLUCOSE-CAPILLARY: 131 mg/dL — AB (ref 65–99)
GLUCOSE-CAPILLARY: 146 mg/dL — AB (ref 65–99)
GLUCOSE-CAPILLARY: 129 mg/dL — AB (ref 65–99)
Glucose-Capillary: 147 mg/dL — ABNORMAL HIGH (ref 65–99)
Glucose-Capillary: 129 mg/dL — ABNORMAL HIGH (ref 65–99)
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
## 2016-01-14 NOTE — Progress Notes (Signed)
RT Note: Attempted to decrease PS to 10 at this time, pt did not tolerate, decreased VT's 200, inc WOB, inc PS back to 15 at this time, will try to wean PS down later today with goal of advancing to ATC trial, RT will monitor
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
## 2016-01-14 NOTE — Progress Notes (Signed)
Wasted 150 ml of Fentanyl with second RN; York Grice RN, per protocol. Edison Wollschlager Murlean Iba RN

## 2016-01-14 NOTE — Progress Notes (Signed)
Pt placed back on vent at this time due to inc WOB, inc RR, pt states hard to breathe on ATC, pt tolerating PS well, RT will monitor

## 2016-01-15 ENCOUNTER — Inpatient Hospital Stay (HOSPITAL_COMMUNITY): Payer: Medicare Other

## 2016-01-15 DIAGNOSIS — I4819 Other persistent atrial fibrillation: Secondary | ICD-10-CM

## 2016-01-15 LAB — GLUCOSE, CAPILLARY
GLUCOSE-CAPILLARY: 125 mg/dL — AB (ref 65–99)
GLUCOSE-CAPILLARY: 118 mg/dL — AB (ref 65–99)
Glucose-Capillary: 152 mg/dL — ABNORMAL HIGH (ref 65–99)
Glucose-Capillary: 127 mg/dL — ABNORMAL HIGH (ref 65–99)
Glucose-Capillary: 119 mg/dL — ABNORMAL HIGH (ref 65–99)

## 2016-01-15 LAB — BASIC METABOLIC PANEL
ANION GAP: 7 (ref 5–15)
BUN: 39 mg/dL — ABNORMAL HIGH (ref 6–20)
CALCIUM: 8.3 mg/dL — AB (ref 8.9–10.3)
CO2: 32 mmol/L (ref 22–32)
CREATININE: 0.73 mg/dL (ref 0.61–1.24)
Chloride: 104 mmol/L (ref 101–111)
GFR calc Af Amer: >60 mL/min (ref >60)
GFR calc non Af Amer: >60 mL/min (ref >60)
GLUCOSE: 119 mg/dL — AB (ref 65–99)
Potassium: 3.5 mmol/L (ref 3.5–5.1)
Sodium: 143 mmol/L (ref 135–145)

## 2016-01-15 LAB — CBC
HEMATOCRIT: 30.6 % — AB (ref 39.0–52.0)
Hemoglobin: 9.2 g/dL — ABNORMAL LOW (ref 13.0–17.0)
MCH: 32.7 pg (ref 26.0–34.0)
MCHC: 30.1 g/dL (ref 30.0–36.0)
MCV: 108.9 fL — AB (ref 78.0–100.0)
Platelets: 200 10*3/uL (ref 150–400)
RBC: 2.81 MIL/uL — ABNORMAL LOW (ref 4.22–5.81)
RDW: 15.9 % — AB (ref 11.5–15.5)
WBC: 6.3 10*3/uL (ref 4.0–10.5)

## 2016-01-15 MED ORDER — POTASSIUM CL IN DEXTROSE 5% 20 MEQ/L IV SOLN
20.0000 meq | INTRAVENOUS | Status: DC
  Administered 2016-01-15 – 2016-01-17 (×2): 20 meq via INTRAVENOUS
  Filled 2016-01-15: qty 1000

## 2016-01-15 NOTE — Progress Notes (Signed)
Patient Name: Chad Rogers Date of Encounter: 01/15/2016  Hospital Problem List     Active Problems:   Cardiomyopathy [facility])   Traumatic fracture of ribs with pneumothorax   Multiple rib fractures   Respiratory failure (HCC)   Acute on chronic systolic CHF (congestive heart failure) (HCC)   NSVT (nonsustained ventricular tachycardia) (HCC)   Absent pulse in lower extremity   Atrial fibrillation, persistent [facility])    Patient Profile     80 yo male with PMH of ETOH/Tobacco abuse/NICM EF 15-20% 2014/COPD/HTN/OA and Pulmonary Fibrosis who presented to [facility] after a fall at home. Found to have multiple left-sided rib fractures, L3-4 transverse process fracture, left acetabular fracture and left pneumothorax. He was transferred to [facility] under trauma services.   He has had vent dependent resp failure and is now status post trach.  Has had atrial fib with RVR managed with IV amiodarone initially.   Subjective   He denies any acute complaints today other than rib pain.   Inpatient Medications    . amiodarone  400 mg Oral BID  . bethanechol  10 mg Per Tube TID  . ceFEPime (MAXIPIME) IV  2 g Intravenous Q12H  . chlorhexidine  15 mL Mouth/Throat BID  . digoxin  0.125 mg Oral Daily  . enoxaparin (LOVENOX) injection  40 mg Subcutaneous Q24H  . famotidine (PEPCID) IV  20 mg Intravenous Q24H  . folic acid  1 mg Oral Daily  . free water  200 mL Per Tube Q4H  . furosemide  40 mg Intravenous BID  . insulin aspart  0-15 Units Subcutaneous Q4H  . mouth rinse  15 mL Mouth Rinse QID  . metoprolol tartrate  12.5 mg Oral BID  . midazolam  2 mg Intravenous Once  . midazolam  2 mg Intravenous Once  . multivitamin with minerals  1 tablet Oral Daily  . sertraline  50 mg Oral Daily  . sodium chloride flush  10-40 mL Intracatheter Q12H  . thiamine  100 mg Oral Daily   Or  . thiamine  100 mg Intravenous Daily    Vital Signs    Vitals:   01/15/16 0500 01/15/16 0600  01/15/16 0749 01/15/16 0800  BP: (!) 96/52 114/74 (!) 94/58 101/68  Pulse: (!) 50 73 83 71  Resp: 18 19 (!) 26 (!) 24  Temp:    97.7 F (36.5 C)  TempSrc:    Axillary  SpO2: 100% 100% 100% 99%  Weight: 192 lb 10.9 oz (87.4 kg)     Height:        Intake/Output Summary (Last 24 hours) at 01/15/16 1010 Last data filed at 01/15/16 0600  Gross per 24 hour  Intake             3350 ml  Output             2225 ml  Net             1125 ml   Filed Weights   01/13/16 0500 01/14/16 0500 01/15/16 0500  Weight: 196 lb 13.9 oz (89.3 kg) 195 lb 12.3 oz (88.8 kg) 192 lb 10.9 oz (87.4 kg)    Physical Exam    GEN: Well nourished, well developed, in  no acute distress.  Neck: Supple, no JVD, carotid bruits, or masses. Cardiac: Irregular, no murmurs, rubs, or gallops. No clubbing, cyanosis, mild ankle edema.  Radials/DP/PT 2+ and equal bilaterally.  Respiratory:  Respirations  regular and unlabored, decreased  breath sounds.  GI: Soft, nontender, nondistended, BS + x 4. Neuro:  Strength and sensation are intact.   Labs    CBC  Recent Labs  01/15/16 0534  WBC 6.3  HGB 9.2*  HCT 30.6*  MCV 108.9*  PLT [id number]   Basic Metabolic Panel  Recent Labs  01/13/16 0630 01/15/16 0534  NA 150* 143  K 3.9 3.5  CL 113* 104  CO2 32 32  GLUCOSE 108* 119*  BUN 50* 39*  CREATININE 0.99 0.73  CALCIUM 8.4* 8.3*   Liver Function Tests No results for input(s): AST, ALT, ALKPHOS, BILITOT, PROT, ALBUMIN in the last 72 hours. No results for input(s): LIPASE, AMYLASE in the last 72 hours. Cardiac Enzymes No results for input(s): CKTOTAL, CKMB, CKMBINDEX, TROPONINI in the last 72 hours. BNP Invalid input(s): POCBNP D-Dimer No results for input(s): DDIMER in the last 72 hours. Hemoglobin A1C No results for input(s): HGBA1C in the last 72 hours. Fasting Lipid Panel No results for input(s): CHOL, HDL, LDLCALC, TRIG, CHOLHDL, LDLDIRECT in the last 72 hours. Thyroid Function Tests No results for  input(s): TSH, T4TOTAL, T3FREE, THYROIDAB in the last 72 hours.  Invalid input(s): FREET3  Telemetry    Atrial fib rate controlled  ECG    NA  Radiology    Ct Head Wo Contrast  Result Date: 12/29/2015 CLINICAL DATA:  Acute onset of left rib and back pain, status post fall down steps. Concern for head or cervical spine injury. Initial encounter. EXAM: CT HEAD WITHOUT CONTRAST CT CERVICAL SPINE WITHOUT CONTRAST TECHNIQUE: Multidetector CT imaging of the head and cervical spine was performed following the standard protocol without intravenous contrast. Multiplanar CT image reconstructions of the cervical spine were also generated. COMPARISON:  MRI of the brain performed 03/20/2005 FINDINGS: CT HEAD FINDINGS There is no evidence of acute infarction, mass lesion, or intra- or extra-axial hemorrhage on CT. Bilateral subdural hygromas are again noted. There is underlying mild cortical volume loss. Mild cerebellar atrophy is noted. Scattered periventricular and subcortical white matter change likely reflects small vessel ischemic microangiopathy. The brainstem and fourth ventricle are within normal limits. The basal ganglia are unremarkable in appearance. The cerebral hemispheres demonstrate grossly normal gray-white differentiation. No mass effect or midline shift is seen. There is no evidence of fracture; visualized osseous structures are unremarkable in appearance. The orbits are within normal limits. The paranasal sinuses and mastoid air cells are well-aerated. No significant soft tissue abnormalities are seen. CT CERVICAL SPINE FINDINGS There is no evidence of fracture or subluxation. Vertebral bodies demonstrate normal height and alignment. Mild multilevel disc space narrowing is noted along the lower cervical spine, with scattered anterior and posterior disc osteophyte complexes, and underlying facet disease along the cervical spine. Prevertebral soft tissues are within normal limits. The thyroid  gland is unremarkable in appearance. The visualized lung apices are clear. Scattered calcification is noted at the carotid bifurcations bilaterally, with likely moderate right-sided luminal narrowing. IMPRESSION: 1. No evidence of traumatic intracranial injury or fracture. 2. No evidence of fracture or subluxation along the cervical spine. 3. Mild cortical volume loss and scattered small vessel ischemic microangiopathy. 4. Bilateral subdural hygromas again noted. 5. Mild degenerative change along the cervical spine. 6. Scattered calcification at the carotid bifurcations bilaterally, with likely moderate right-sided luminal narrowing. Carotid ultrasound would be helpful for further evaluation, when and as deemed clinically appropriate. Electronically Signed   By: Garald Balding M.D.   On: 12/29/2015 01:07   Ct Chest W Contrast  Result Date: 12/29/2015 CLINICAL DATA:  80 year old male with acute left chest pain, abdominal and pelvic pain following fall. EXAM: CT CHEST, ABDOMEN, AND PELVIS WITH CONTRAST TECHNIQUE: Multidetector CT imaging of the chest, abdomen and pelvis was performed following the standard protocol during bolus administration of intravenous contrast. CONTRAST:  161mL ISOVUE-300 IOPAMIDOL (ISOVUE-300) INJECTION 61% COMPARISON:  Prior radiographs FINDINGS: CT CHEST FINDINGS Cardiovascular: Cardiomegaly and coronary artery calcifications noted. There is no evidence of thoracic aortic aneurysm. Mediastinum/Nodes: No mediastinal mass, enlarged lymph nodes or pericardial effusion. Lungs/Pleura: A small anterior inferior left pneumothorax identified. Mild dependent and basilar atelectasis noted. No airspace disease, suspicious nodule, mass or consolidation identified. Musculoskeletal: There are acute fractures of the left third through twelfth ribs. Fractures of the lateral fourth through seventh ribs are displaced. Left subcutaneous emphysema is identified. CT ABDOMEN PELVIS FINDINGS Hepatobiliary:  Hepatic steatosis identified. The gallbladder is unremarkable. There is no evidence of biliary dilatation. Pancreas: Unremarkable Spleen: Unremarkable Adrenals/Urinary Tract: The kidneys, adrenal glands and bladder are unremarkable. Stomach/Bowel: No bowel obstruction or definite bowel wall thickening. The appendix is normal. Vascular/Lymphatic: Heavy abdominal aortic atherosclerotic calcifications noted without aneurysm. No enlarged lymph nodes identified. Reproductive: Unremarkable Other: No free fluid or pneumoperitoneum. A small to moderate right inguinal hernia containing small bowel noted without bowel obstruction. A small left inguinal hernia containing fat is present. Musculoskeletal: A nondisplaced fracture of the anterior left acetabulum noted. Nondisplaced fractures of the right L3 and L4 transverse process is noted. IMPRESSION: Acute fractures of the left third through twelfth ribs with small left pneumothorax and left subcutaneous emphysema. Acute nondisplaced fractures of the anterior left acetabulum and right L3 and L4 transverse processes. No evidence of solid or hollow visceral injury. Cardiomegaly and coronary artery disease. Hepatic steatosis. Abdominal aortic atherosclerosis. Critical Value/emergent results were called by telephone at the time of interpretation on 12/29/2015 at 1:08 am to Dr. Delora Fuel , who verbally acknowledged these results. Electronically Signed   By: Margarette Canada M.D.   On: 12/29/2015 01:08   Ct Cervical Spine Wo Contrast  Result Date: 12/29/2015 CLINICAL DATA:  Acute onset of left rib and back pain, status post fall down steps. Concern for head or cervical spine injury. Initial encounter. EXAM: CT HEAD WITHOUT CONTRAST CT CERVICAL SPINE WITHOUT CONTRAST TECHNIQUE: Multidetector CT imaging of the head and cervical spine was performed following the standard protocol without intravenous contrast. Multiplanar CT image reconstructions of the cervical spine were also  generated. COMPARISON:  MRI of the brain performed 03/20/2005 FINDINGS: CT HEAD FINDINGS There is no evidence of acute infarction, mass lesion, or intra- or extra-axial hemorrhage on CT. Bilateral subdural hygromas are again noted. There is underlying mild cortical volume loss. Mild cerebellar atrophy is noted. Scattered periventricular and subcortical white matter change likely reflects small vessel ischemic microangiopathy. The brainstem and fourth ventricle are within normal limits. The basal ganglia are unremarkable in appearance. The cerebral hemispheres demonstrate grossly normal gray-white differentiation. No mass effect or midline shift is seen. There is no evidence of fracture; visualized osseous structures are unremarkable in appearance. The orbits are within normal limits. The paranasal sinuses and mastoid air cells are well-aerated. No significant soft tissue abnormalities are seen. CT CERVICAL SPINE FINDINGS There is no evidence of fracture or subluxation. Vertebral bodies demonstrate normal height and alignment. Mild multilevel disc space narrowing is noted along the lower cervical spine, with scattered anterior and posterior disc osteophyte complexes, and underlying facet disease along the cervical spine. Prevertebral soft tissues are  within normal limits. The thyroid gland is unremarkable in appearance. The visualized lung apices are clear. Scattered calcification is noted at the carotid bifurcations bilaterally, with likely moderate right-sided luminal narrowing. IMPRESSION: 1. No evidence of traumatic intracranial injury or fracture. 2. No evidence of fracture or subluxation along the cervical spine. 3. Mild cortical volume loss and scattered small vessel ischemic microangiopathy. 4. Bilateral subdural hygromas again noted. 5. Mild degenerative change along the cervical spine. 6. Scattered calcification at the carotid bifurcations bilaterally, with likely moderate right-sided luminal narrowing.  Carotid ultrasound would be helpful for further evaluation, when and as deemed clinically appropriate. Electronically Signed   By: Garald Balding M.D.   On: 12/29/2015 01:07   Ct Abdomen Pelvis W Contrast  Result Date: 12/29/2015 CLINICAL DATA:  80 year old male with acute left chest pain, abdominal and pelvic pain following fall. EXAM: CT CHEST, ABDOMEN, AND PELVIS WITH CONTRAST TECHNIQUE: Multidetector CT imaging of the chest, abdomen and pelvis was performed following the standard protocol during bolus administration of intravenous contrast. CONTRAST:  116mL ISOVUE-300 IOPAMIDOL (ISOVUE-300) INJECTION 61% COMPARISON:  Prior radiographs FINDINGS: CT CHEST FINDINGS Cardiovascular: Cardiomegaly and coronary artery calcifications noted. There is no evidence of thoracic aortic aneurysm. Mediastinum/Nodes: No mediastinal mass, enlarged lymph nodes or pericardial effusion. Lungs/Pleura: A small anterior inferior left pneumothorax identified. Mild dependent and basilar atelectasis noted. No airspace disease, suspicious nodule, mass or consolidation identified. Musculoskeletal: There are acute fractures of the left third through twelfth ribs. Fractures of the lateral fourth through seventh ribs are displaced. Left subcutaneous emphysema is identified. CT ABDOMEN PELVIS FINDINGS Hepatobiliary: Hepatic steatosis identified. The gallbladder is unremarkable. There is no evidence of biliary dilatation. Pancreas: Unremarkable Spleen: Unremarkable Adrenals/Urinary Tract: The kidneys, adrenal glands and bladder are unremarkable. Stomach/Bowel: No bowel obstruction or definite bowel wall thickening. The appendix is normal. Vascular/Lymphatic: Heavy abdominal aortic atherosclerotic calcifications noted without aneurysm. No enlarged lymph nodes identified. Reproductive: Unremarkable Other: No free fluid or pneumoperitoneum. A small to moderate right inguinal hernia containing small bowel noted without bowel obstruction. A small  left inguinal hernia containing fat is present. Musculoskeletal: A nondisplaced fracture of the anterior left acetabulum noted. Nondisplaced fractures of the right L3 and L4 transverse process is noted. IMPRESSION: Acute fractures of the left third through twelfth ribs with small left pneumothorax and left subcutaneous emphysema. Acute nondisplaced fractures of the anterior left acetabulum and right L3 and L4 transverse processes. No evidence of solid or hollow visceral injury. Cardiomegaly and coronary artery disease. Hepatic steatosis. Abdominal aortic atherosclerosis. Critical Value/emergent results were called by telephone at the time of interpretation on 12/29/2015 at 1:08 am to Dr. Delora Fuel , who verbally acknowledged these results. Electronically Signed   By: Margarette Canada M.D.   On: 12/29/2015 01:08   Dg Pelvis Comp Min 3v  Result Date: 01/01/2016 CLINICAL DATA:  Fall, pelvic fractures. EXAM: JUDET PELVIS - 3+ VIEW COMPARISON:  CT 12/28/2015 FINDINGS: Previously seen left anterior acetabular fractures are better seen by CT. Fractures are not visible by plain film. No additional fracture noted. Degenerative changes in the hips with joint space narrowing and spurring. IMPRESSION: Previously seen left anterior acetabular fracture is not visible by plain films. Mild degenerative changes in the hips bilaterally. Electronically Signed   By: Rolm Baptise M.D.   On: 01/01/2016 11:13   Dg Chest Port 1 View  Result Date: 01/15/2016 CLINICAL DATA:  Cardiomyopathy with traumatic fracture of the ribs. EXAM: PORTABLE CHEST 1 VIEW COMPARISON:  01/14/2016. FINDINGS: LEFT  chest tube good position. No definite pneumothorax. LEFT lower lobe volume loss. Tracheostomy tube good position. Cardiomegaly. No RIGHT lung pathology. PICC line unchanged. Multiple LEFT rib fractures unchanged. IMPRESSION: Stable chest.  No visible pneumothorax. Electronically Signed   By: Staci Righter M.D.   On: 01/15/2016 07:14   Dg Chest  Port 1 View  Result Date: 01/14/2016 CLINICAL DATA:  Pneumothorax on the left.  Chest tube. EXAM: PORTABLE CHEST 1 VIEW COMPARISON:  01/13/2016 FINDINGS: Left chest tube remains in place. No visible pneumothorax. Tracheostomy tube and right PICC line remain in place, unchanged. There is cardiomegaly. Bilateral lower lobe airspace opacities normal left greater than right, unchanged. Multiple left rib fractures again noted. IMPRESSION: No visible pneumothorax. Support devices unchanged. Stable bilateral lower lobe opacities, left greater than right. Electronically Signed   By: Rolm Baptise M.D.   On: 01/14/2016 09:00   Dg Chest Port 1 View  Result Date: 01/13/2016 CLINICAL DATA:  Multiple rib fractures on the left. EXAM: PORTABLE CHEST 1 VIEW COMPARISON:  01/12/2016 FINDINGS: Tracheostomy tube, right PICC line and left chest tube remain in place, unchanged. No pneumothorax. Cardiomegaly. Bibasilar atelectasis and mild vascular congestion. Small layering left effusion. Multiple left rib fractures again noted. IMPRESSION: No significant change.  Cardiomegaly with vascular congestion. Bibasilar atelectasis.  Smaller in left effusion. Multiple left rib fractures. Electronically Signed   By: Rolm Baptise M.D.   On: 01/13/2016 07:15   Dg Chest Port 1 View  Result Date: 01/12/2016 CLINICAL DATA:  Respiratory failure. EXAM: PORTABLE CHEST 1 VIEW COMPARISON:  01/11/2016. FINDINGS: Interim removal of feeding tube . Tracheostomy tube, right PICC line, left chest tube in stable position. Cardiomegaly with progressive bilateral pulmonary infiltrates suggesting pulmonary edema. Bilateral pneumonia cannot be excluded. No pneumothorax. Left chest wall subcutaneous emphysema has improved. Left rib fractures again noted. IMPRESSION: 1. Interval removal of feeding tube. Tracheostomy tube, right PICC line, left chest tube in stable position. No pneumothorax. 2. Cardiomegaly with progressive bilateral pulmonary infiltrates  suggesting pulmonary edema. Bilateral pneumonia cannot be excluded. 3. Left chest wall subcutaneous emphysema has improved. Left rib fractures again noted. Electronically Signed   By: Marcello Moores  Register   On: 01/12/2016 06:56   Dg Chest Port 1 View  Result Date: 01/11/2016 CLINICAL DATA:  Tracheostomy tube change EXAM: PORTABLE CHEST 1 VIEW COMPARISON:  01/11/2016 FINDINGS: Cardiomediastinal silhouette is stable. Slight improvement in aeration. Mild perihilar bronchitic changes. No pulmonary edema. Left chest tube is unchanged in position. Tracheostomy tube in place. Right arm PICC line is unchanged in position. Streaky left basilar atelectasis on infiltrate. Subcutaneous emphysema left axilla again noted. Small left pleural effusion. Displaced left rib fractures again noted. IMPRESSION: Slight improvement in aeration. Mild perihilar bronchitic changes. No pulmonary edema. Left chest tube is unchanged in position. Tracheostomy tube in place. Right arm PICC line is unchanged in position. Streaky left basilar atelectasis on infiltrate. Subcutaneous emphysema left axilla again noted. Small left pleural effusion. Displaced left rib fractures again noted. Electronically Signed   By: Lahoma Crocker M.D.   On: 01/11/2016 11:14   Dg Chest Port 1 View  Result Date: 01/11/2016 CLINICAL DATA:  Tracheostomy EXAM: PORTABLE CHEST 1 VIEW COMPARISON:  Yesterday FINDINGS: Tracheostomy tube is well seated. Left-sided chest tube in similar position. Right upper extremity PICC with tip at the SVC level. Progressive hazy opacity at the bases which is likely layering pleural fluid. Mild pulmonary edema. Basilar atelectasis. Unchanged cardiopericardial enlargement. No visible pneumothorax. Numerous displaced left rib fractures with  unchanged left chest wall gas. IMPRESSION: 1. Stable positioning of tubes and central line. 2. Increased or layering of bilateral pleural effusion. Pulmonary edema. Basilar atelectasis. 3. No visible  pneumothorax. Electronically Signed   By: Monte Fantasia M.D.   On: 01/11/2016 01:26   Dg Chest Port 1 View  Result Date: 01/10/2016 CLINICAL DATA:  Tracheostomy placement EXAM: PORTABLE CHEST 1 VIEW COMPARISON:  Study obtained earlier in the day FINDINGS: Tracheostomy catheter tip is 5.4 cm above the carina. Central catheter tip is in the superior vena cava. There is a chest tube on the left. There remains soft tissue air on the left laterally. No pneumothorax is evident currently. There is atelectatic change in the left base. There remains patchy airspace consolidation in the right mid lung. There has been clearing of interstitial edema compared to earlier in the day. Lungs elsewhere are clear currently. Heart is mildly enlarged with pulmonary vascularity within normal limits. No adenopathy. There is atherosclerotic calcification in the aortic arch region. Multiple displaced rib fractures are noted on the left. IMPRESSION: Patchy infiltrate in the right mid lung. Atelectasis left base. No appreciable interstitial edema currently. Stable cardiomegaly. Tube and catheter positions as described. Soft tissue air is noted on the left without pneumothorax. Multiple displaced rib fractures are noted on the left. Electronically Signed   By: Lowella Grip III M.D.   On: 01/10/2016 15:41   Dg Chest Port 1 View  Result Date: 01/10/2016 CLINICAL DATA:  Chest trauma.  Chest tube. EXAM: PORTABLE CHEST 1 VIEW COMPARISON:  01/09/2016. FINDINGS: Endotracheal tube, NG tube, right PICC line, left chest tube in stable position. No pneumothorax. Stable left chest wall subcutaneous emphysema. Cardiomegaly with diffuse pulmonary interstitial infiltrates, progressed from prior exam. Findings suggest congestive heart failure. Small left pleural effusion. Multiple left rib fractures again noted. IMPRESSION: 1. Lines and tubes including left chest tube in stable position. No pneumothorax. Stable left chest wall subcutaneous  emphysema. Multiple displaced left rib fractures again noted. 2. Cardiomegaly with progressive diffuse bilateral pulmonary infiltrates consistent with congestive heart failure with pulmonary edema. Small left pleural effusion. Electronically Signed   By: Marcello Moores  Register   On: 01/10/2016 07:31   Dg Chest Port 1 View  Result Date: 01/09/2016 CLINICAL DATA:  Intubation.  Chest tube. EXAM: PORTABLE CHEST 1 VIEW COMPARISON:  12/19/2015 . FINDINGS: Endotracheal tube, NG tube, right PICC line, left chest tube in stable position. No pneumothorax. Cardiomegaly interim partial clearing from interstitial prominence suggesting clearing congestive heart failure. Small left pleural effusion. No pneumothorax. Left chest wall subcutaneous emphysema again noted. Multiple left-sided rib fractures again noted. IMPRESSION: 1. Lines and tubes including left chest tube in stable position. No pneumothorax. Stable left chest wall subcutaneous emphysema . 2. Cardiomegaly with diffuse bilateral pulmonary interstitial prominence, improved from prior exam. Small left pleural effusion. Findings consistent with improving congestive heart failure. Electronically Signed   By: Marcello Moores  Register   On: 01/09/2016 07:27   Dg Chest Port 1 View  Result Date: 01/08/2016 CLINICAL DATA:  Trauma and respiratory failure. EXAM: PORTABLE CHEST 1 VIEW COMPARISON:  01/07/2016 FINDINGS: Endotracheal tube tip projects approximately 2 cm above the carina. Gastric decompression tube continues to extend below the diaphragm. A left chest tube shows stable positioning. No pneumothorax identified. Stable to decreased subcutaneous emphysema in the left chest wall. Stable displaced left rib fractures. Slight increase in right lower lung atelectasis. Stable cardiac enlargement. IMPRESSION: No pneumothorax. Stable to decreased subcutaneous emphysema in the left chest wall without  visible pneumothorax. Increase in right lower lung atelectasis. Electronically Signed    By: Aletta Edouard M.D.   On: 01/08/2016 08:52   Dg Chest Port 1 View  Result Date: 01/07/2016 CLINICAL DATA:  Chest tube in place EXAM: PORTABLE CHEST 1 VIEW COMPARISON:  01/05/2016 FINDINGS: Cardiomediastinal silhouette is stable. Endotracheal tube in place with tip 3.5 cm above the carina. Stable NG tube position. Stable left chest tube position. There is central mild vascular congestion. Small bilateral pleural effusion with bilateral basilar atelectasis or infiltrate. No convincing pulmonary edema. Right arm PICC line with tip in distal SVC. There is no pneumothorax. Left rib fractures are noted. Subcutaneous emphysema in left axilla. IMPRESSION: Endotracheal tube in place with tip 3.5 cm above the carina. Stable NG tube position. Stable left chest tube position. There is central mild vascular congestion. Small bilateral pleural effusion with bilateral basilar atelectasis or infiltrate. No convincing pulmonary edema. Right arm PICC line with tip in distal SVC. There is no pneumothorax. Left rib fractures are noted. Subcutaneous emphysema in left axilla. Electronically Signed   By: Lahoma Crocker M.D.   On: 01/07/2016 09:08   Dg Chest Port 1 View  Result Date: 01/05/2016 CLINICAL DATA:  Pneumonia and intubation. EXAM: PORTABLE CHEST 1 VIEW COMPARISON:  01/04/2016 FINDINGS: Endotracheal tube is appropriately positioned above the carina. PICC line tip in the SVC region. Stable position of the left chest tube. Large amount of subcutaneous gas in the left chest. No evidence for a pneumothorax. Heart size remains mildly enlarged. Patchy densities in the right lower lung. Linear density at the left lung base could represent atelectasis. Aortic atherosclerotic calcifications. Nasogastric tube is coiled in the stomach. IMPRESSION: Stable position of the left chest tube without a definite pneumothorax. Persistent patchy densities at the right lung base. Findings could represent airspace disease or atelectasis.  Support apparatuses as described. Electronically Signed   By: Markus Daft M.D.   On: 01/05/2016 07:23   Dg Chest Port 1 View  Result Date: 01/04/2016 CLINICAL DATA:  Intubated. EXAM: PORTABLE CHEST 1 VIEW COMPARISON:  01/03/2016 FINDINGS: The endotracheal tube is in satisfactory position. Stable left chest tube. The previously demonstrated small left apical pneumothorax is no longer clearly visualized. Left rib fractures and subcutaneous emphysema are again demonstrated. The cardiac silhouette remains enlarged and the interstitial markings remain prominent. No pleural fluid seen. Diffuse osteopenia. IMPRESSION: 1. The previously seen small left apical pneumothorax is no longer clearly visualized. 2. Mildly decreased subcutaneous emphysema on the left. 3. Stable cardiomegaly and interstitial pulmonary edema. Electronically Signed   By: Claudie Revering M.D.   On: 01/04/2016 08:26   Dg Chest Port 1 View  Result Date: 01/03/2016 CLINICAL DATA:  Cardiomyopathy, traumatic rib fractures and pneumothorax on the left, acute on chronic CHF, COPD, current smoker. EXAM: PORTABLE CHEST 1 VIEW COMPARISON:  Portable chest of January 02, 2016 FINDINGS: The lungs are adequately inflated. Interstitial and alveolar opacity in the right mid lung persists. The interstitial markings elsewhere are mildly increased. The left chest tube is in stable position. A faint pleural line is visible in the left pulmonary apex there is no significant pleural effusion. There is left basilar atelectasis. The cardiac silhouette is enlarged though stable. The central pulmonary vascularity is prominent. The endotracheal tube lies 4 cm above the carina. The esophagogastric tube extends below the GE junction with the tip coiling in the gastric cardia. The right sided PICC line tip projects over the midportion of the SVC. IMPRESSION: Small  left apical pneumothorax. Stable left chest tube. Slight interval increased prominence of the pulmonary interstitial  markings consistent with interstitial edema. Stable right perihilar density consistent with pneumonia or atelectasis. The support tubes are in reasonable position. Electronically Signed   By: David  Martinique M.D.   On: 01/03/2016 07:45   Dg Chest Port 1 View  Result Date: 01/02/2016 CLINICAL DATA:  Chest tube. EXAM: PORTABLE CHEST 1 VIEW COMPARISON:  01/01/2016. FINDINGS: Endotracheal tube, NG tube, right PICC line stable position. Left chest tube in stable position. Cardiomegaly with diffuse bilateral from interstitial prominence again noted . No pneumothorax. Multiple displaced left rib fractures again noted. Progressive left chest wall subcutaneous emphysema . IMPRESSION: 1. Lines and tubes including left chest tube in stable position. No pneumothorax. 2. Cardiomegaly with diffuse bilateral mild interstitial prominence consistent with mild congestive heart failure. No interim change. 3. Multiple displaced left rib fractures again noted. Progressive left chest wall subcutaneous emphysema . Electronically Signed   By: Marcello Moores  Register   On: 01/02/2016 07:21   Dg Chest Port 1 View  Result Date: 01/01/2016 CLINICAL DATA:  Intubation. EXAM: PORTABLE CHEST 1 VIEW COMPARISON:  12/31/2015. FINDINGS: Endotracheal tube, NG tube, right PICC line, left chest tube in stable position. Cardiomegaly with pulmonary vascular prominence and bilateral interstitial prominence consistent congestive heart failure. Bibasilar atelectasis. No pneumothorax. Multiple left displaced rib fractures again noted. Left chest wall subcutaneous emphysema is again noted . IMPRESSION: 1. Lines and tubes including left chest tube in stable position. No pneumothorax. 2. Cardiomegaly with pulmonary vascular prominence and bilateral interstitial prominence suggesting congestive heart failure. Bibasilar atelectasis . 3. Multiple left rib fractures again noted. Left chest wall subcutaneous emphysema again noted. Electronically Signed   By: Marcello Moores   Register   On: 01/01/2016 07:24   Dg Chest Port 1 View  Result Date: 12/31/2015 CLINICAL DATA:  80 year old male status post fall 3 days ago with left rib fractures. Left chest tube. Initial encounter. EXAM: PORTABLE CHEST 1 VIEW COMPARISON:  0353 hours today and earlier. FINDINGS: Portable AP semi upright view at 1551 hours. Stable endotracheal tube tip between the level the clavicles and carina. Stable right PICC line. Stable enteric tube which appears looped in the proximal stomach. Stable left chest tube. Mild to moderate volume of left chest wall subcutaneous gas is stable. No left pneumothorax identified. Dense retrocardiac opacity persists, likely due to left lower lobe collapse. Veiling right lung base opacity is stable. Stable cardiac size and mediastinal contours. IMPRESSION: 1.  Stable lines and tubes. 2. No left pneumothorax identified. Left lower lobe collapse suspected. 3. Patchy right lung base opacity is stable and probably reflects a combination of pleural effusion and atelectasis. Electronically Signed   By: Genevie Ann M.D.   On: 12/31/2015 16:10   Dg Chest Port 1 View  Result Date: 12/31/2015 CLINICAL DATA:  Acute onset of shortness of breath. Desaturation. Initial encounter. EXAM: PORTABLE CHEST 1 VIEW COMPARISON:  Chest radiograph performed 12/30/2015 FINDINGS: [name] endotracheal tube is seen ending 2 cm above the carina. An enteric tube is noted extending below the diaphragm. Vascular congestion is noted. Bibasilar airspace opacities may reflect pulmonary edema or pneumonia, worsened from the prior study. No definite pneumothorax is seen. The cardiomediastinal silhouette is mildly enlarged. Left-sided rib fractures are again noted, with overlying soft tissue air. The left-sided chest tube is unchanged in appearance. IMPRESSION: 1. Vascular congestion and mild cardiomegaly. Bibasilar airspace opacities may reflect pulmonary edema or pneumonia, worsened from the prior study.  2.  Endotracheal tube seen ending 2 cm above the carina. 3. Left-sided chest tube is unchanged in appearance. 4. Left-sided rib fractures again noted, with overlying soft tissue air. Electronically Signed   By: Garald Balding M.D.   On: 12/31/2015 04:09   Dg Chest Port 1 View  Result Date: 12/30/2015 CLINICAL DATA:  Chest tube placement.  Initial encounter. EXAM: PORTABLE CHEST 1 VIEW COMPARISON:  Chest radiograph performed earlier today at 8:57 p.m. FINDINGS: The previously noted small left pneumothorax is no longer definitely seen. The left-sided chest tube is seen ending overlying the left hilum. Overlying soft tissue air is again noted. Underlying left-sided rib fractures are better characterized on prior CT. A right PICC is noted ending about the distal SVC. [name] endotracheal tube is seen ending 4 cm above the carina. An enteric tube is noted extending below the diaphragm. Mild right basilar opacity may reflect atelectasis, as it is new from the recent prior study. No pneumothorax is seen. The cardiomediastinal silhouette is mildly enlarged. No acute osseous abnormalities identified. IMPRESSION: 1. Previously noted small left pneumothorax is no longer definitely seen. Left-sided chest tube noted overlying the left hilum. Associated left-sided rib fractures and overlying soft tissue air again noted. 2. Endotracheal tube seen ending 4 cm above the carina. 3. Mild right basilar airspace opacity is new from the recent prior study and may reflect atelectasis. 4. Mild cardiomegaly. Electronically Signed   By: Garald Balding M.D.   On: 12/30/2015 22:49   Dg Chest Port 1 View  Result Date: 12/30/2015 CLINICAL DATA:  Endotracheal tube placement. Orogastric tube placement. Initial encounter. EXAM: PORTABLE CHEST 1 VIEW COMPARISON:  Chest radiograph performed earlier today at 3:31 p.m. FINDINGS: [name] endotracheal tube is seen ending 5 cm above the carina. An enteric tube is noted extending below the  diaphragm. Mild vascular congestion is noted. Mild bibasilar opacities may reflect mild interstitial edema. A small left-sided pneumothorax is noted, slightly more apparent than on the prior study. No significant pleural effusion is seen. The cardiomediastinal silhouette is mildly enlarged. Left-sided rib fractures are again seen, with overlying soft tissue air. IMPRESSION: 1. Endotracheal tube seen ending 5 cm above the carina. 2. Enteric tube seen extending below the diaphragm. 3. Mild vascular congestion and cardiomegaly. Mild bibasilar opacities may reflect mild interstitial edema. 4. Small left-sided pneumothorax is slightly more apparent than on the prior study. 5. Left-sided rib fractures again noted, with overlying soft tissue air. Electronically Signed   By: Garald Balding M.D.   On: 12/30/2015 21:17   Dg Chest Port 1 View  Result Date: 12/30/2015 CLINICAL DATA:  Line placement EXAM: PORTABLE CHEST 1 VIEW COMPARISON:  Portable exam 1531 hours compared to 0432 hours FINDINGS: RIGHT arm PICC line tip projects over SVC. Enlargement of cardiac silhouette with pulmonary vascular congestion. Atherosclerotic calcification aorta. Bibasilar atelectasis. Small LEFT apical pneumothorax seen on previous study less well visualized. No definite infiltrate or pleural effusion. Osseous demineralization with LEFT rib fractures again noted. IMPRESSION: Tip of RIGHT arm PICC line projects over SVC. Aortic atherosclerosis. Bibasilar atelectasis. Electronically Signed   By: Lavonia Dana M.D.   On: 12/30/2015 15:40   Dg Chest Port 1 View  Result Date: 12/30/2015 CLINICAL DATA:  Patient with history of multiple rib fractures on the left. EXAM: PORTABLE CHEST 1 VIEW COMPARISON:  Chest radiograph 12/29/2015. FINDINGS: Monitoring leads overlie the patient. Stable enlarged cardiac and mediastinal contours. Slight interval increase in mid and lower lung heterogeneous pulmonary opacities.  Calcified granuloma right mid lung.  Interval increase in subcutaneous emphysema overlying the left chest wall. Multiple displaced left lateral rib fractures are demonstrated. Findings suggestive of small left apical pneumothorax. IMPRESSION: Small left apical pneumothorax. Increased basilar heterogeneous opacities which may represent atelectasis. Multiple displaced left-sided rib fractures. Increased left chest wall subcutaneous emphysema. Critical Value/emergent results were called by telephone at the time of interpretation on 12/30/2015 at 7:13 am to Nurse Weston Brass, who verbally acknowledged these results. Electronically Signed   By: Lovey Newcomer M.D.   On: 12/30/2015 07:15   Dg Chest Port 1 View  Result Date: 12/29/2015 CLINICAL DATA:  Acute onset of worsening shortness of breath and left-sided chest pain, with decreased O2 saturation. Status post fall down steps. Initial encounter. EXAM: PORTABLE CHEST 1 VIEW COMPARISON:  Chest radiograph and CT of the chest performed 12/28/2015 FINDINGS: The lungs are well-aerated. Mild left-sided atelectasis is again noted. Vascular congestion is seen. There is no evidence of pleural effusion or pneumothorax. The cardiomediastinal silhouette is mildly enlarged. [name] known numerous left-sided rib fractures are only partially characterized, with scattered soft tissue air along the left chest wall. IMPRESSION: 1. Mild left-sided atelectasis again noted, with underlying numerous left-sided rib fractures and scattered soft tissue air along the left chest wall. 2.  Vascular congestion and mild cardiomegaly. Electronically Signed   By: Garald Balding M.D.   On: 12/29/2015 04:38   Dg Chest Port 1 View  Result Date: 12/29/2015 CLINICAL DATA:  Acute onset of left rib pain and back pain, status post fall down steps. Initial encounter. EXAM: PORTABLE CHEST 1 VIEW COMPARISON:  Chest radiograph performed 09/05/2011 FINDINGS: The lungs are well-aerated. Mild vascular congestion is noted. Mild left basilar  atelectasis is seen. There is no evidence of pleural effusion or pneumothorax. The cardiomediastinal silhouette is enlarged. There is a displaced fracture of the left lateral seventh rib, with a small amount of associated soft tissue air along the chest wall. IMPRESSION: 1. Displaced fracture of the left lateral seventh rib, with small amount of associated soft tissue air along the left chest wall. Mild left basilar atelectasis noted. 2. Mild vascular congestion and cardiomegaly noted. Electronically Signed   By: Garald Balding M.D.   On: 12/29/2015 00:10   Dg Abd Portable 1v  Result Date: 01/12/2016 CLINICAL DATA:  Check feeding catheter placement EXAM: PORTABLE ABDOMEN - 1 VIEW COMPARISON:  None. FINDINGS: Scattered large and small bowel gas is noted. A feeding catheter is seen within the stomach directed towards the proximal duodenum. No free air is seen. No other focal abnormality is noted. IMPRESSION: Feeding catheter as described. Electronically Signed   By: Inez Catalina M.D.   On: 01/12/2016 09:10   Dg Abd Portable 1v  Result Date: 01/11/2016 CLINICAL DATA:  Check feeding catheter EXAM: PORTABLE ABDOMEN - 1 VIEW COMPARISON:  None. FINDINGS: Scattered large and small bowel gas is noted. A feeding catheter is noted coiled within the stomach. A left chest tube is noted. IMPRESSION: Feeding catheter coiled within the stomach. Electronically Signed   By: Inez Catalina M.D.   On: 01/11/2016 11:13   Dg Abd Portable 1v  Result Date: 01/09/2016 CLINICAL DATA:  Adynamic ileus EXAM: PORTABLE ABDOMEN - 1 VIEW COMPARISON:  01/01/2016 FINDINGS: The nasogastric tube is within the stomach. There is moderate gaseous distension of the stomach. Moderate gaseous distension of the small and large bowel loops are also noted. There is desiccated stool noted within the rectum. IMPRESSION: 1. Again noted  is gaseous distension of the stomach and bowel loops compatible with ileus. 2. Desiccated stool within the rectum.  Electronically Signed   By: Kerby Moors M.D.   On: 01/09/2016 08:19   Dg Abd Portable 1v  Result Date: 12/30/2015 CLINICAL DATA:  Enteric tube placement.  Initial encounter. EXAM: PORTABLE ABDOMEN - 1 VIEW COMPARISON:  CT of the abdomen and pelvis from 12/28/2015 FINDINGS: [name] enteric tube is seen coiling within the stomach, ending overlying the fundus of the stomach. The visualized bowel gas pattern is grossly unremarkable. No free intra-abdominal air is seen, though evaluation for free air is limited on a single supine view. No acute osseous abnormalities are seen. Mild degenerative change is noted along the lower lumbar spine. IMPRESSION: Enteric tube noted coiling within the stomach, ending overlying the fundus of the stomach. Electronically Signed   By: Garald Balding M.D.   On: 12/30/2015 21:22    Assessment & Plan    ATRIAL FIB:  Rate is controlled on oral amiodarone.  Continue current therapy.   Mr. KHI COSTILLA has a CHA2DS2 - VASc score of 5 with a risk of stroke of 6.7%.   Needs to be on systemic anticoagulation when we are sure that there are no further invasive procedures planned and no active bleeding issues if he is thought to be at low fall risk in the future.  (This would depend on his condition after he has recovered from acute events and has had rehab.)  ACUTE ON CHRONIC SYSTOLIC HF:   Continue current IV diuresis.  Follow daily BMETs.   Signed, Minus Breeding, MD  01/15/2016, 10:10 AM

## 2016-01-15 NOTE — Progress Notes (Signed)
This note also relates to the following rows which could not be included: SpO2 - Cannot attach notes to unvalidated device data RT was called by RN to notify that patient ventilator was alarming no patient effort.  Patient switched to full support mode with previous settings :   01/15/16 0450  Vent [facility]  Invasive or Noninvasive Invasive  Adult Vent Y  Tracheostomy Shiley 6 mm Cuffed;Proximal  Placement Date/Time: 01/11/16 1055   Placed By: (c) ICU physician  Brand: Shiley  Size (mm): 6 mm  Style: Cuffed;Proximal  Status Secured  Site Assessment Clean;Dry  Ties Assessment Secure;Dry  Tracheostomy Equipment at bedside Yes and checklist posted at head of bed  Adult Ventilator Settings  Vent Type Servo i  Humidity HME  Vent Mode PRVC (no patient effort)  Vt Set 540 mL  Set Rate 18 bmp  FiO2 (%) 40 %  I Time 0.9 Sec(s)  PEEP 5 cmH20  Adult Ventilator Measurements  Peak Airway Pressure 25 L/min  Mean Airway Pressure 10 cmH20  Plateau Pressure 17 cmH20  Resp Rate Spontaneous 0 br/min  Resp Rate Total 18 br/min  Exhaled Vt 530 mL  Measured Ve 9.7 mL  I:E Ratio Measured 1:2.6  Adult Ventilator Alarms  Alarms On Y

## 2016-01-15 NOTE — Progress Notes (Signed)
Trauma Service Note  Subjective: No acute issues overnight, on CPAP most of yesterday  Objective: Vital signs in last 24 hours: Temp:  [97.4 F (36.3 C)-98 F (36.7 C)] 97.7 F (36.5 C) (08/28 0800) Pulse Rate:  [43-106] 83 (08/28 0749) Resp:  [11-34] 26 (08/28 0749) BP: (89-144)/(52-103) 94/58 (08/28 0749) SpO2:  [96 %-100 %] 100 % (08/28 0749) FiO2 (%):  [40 %] 40 % (08/28 0749) Weight:  [87.4 kg (192 lb 10.9 oz)] 87.4 kg (192 lb 10.9 oz) (08/28 0500) Last BM Date: 01/14/16  Intake/Output from previous day: 08/27 0701 - 08/28 0700 In: 3910 [I.V.:1150; NG/GT:2610; IV Piggyback:150] Out: 2225 [Urine:2225] Intake/Output this shift: No intake/output data recorded.  General: NAD  Lungs: coarse b/l  Abd: soft, NT, ND  Extremities: +edema b/l lower extremities  Neuro: GCS 11T, moves all extremities  Lab Results: CBC   Recent Labs  01/15/16 0534  WBC 6.3  HGB 9.2*  HCT 30.6*  PLT 200   BMET  Recent Labs  01/13/16 0630 01/15/16 0534  NA 150* 143  K 3.9 3.5  CL 113* 104  CO2 32 32  GLUCOSE 108* 119*  BUN 50* 39*  CREATININE 0.99 0.73  CALCIUM 8.4* 8.3*   PT/INR No results for input(s): LABPROT, INR in the last 72 hours. ABG No results for input(s): PHART, HCO3 in the last 72 hours.  Invalid input(s): PCO2, PO2  Studies/Results: Dg Chest Port 1 View  Result Date: 01/15/2016 CLINICAL DATA:  Cardiomyopathy with traumatic fracture of the ribs. EXAM: PORTABLE CHEST 1 VIEW COMPARISON:  01/14/2016. FINDINGS: LEFT chest tube good position. No definite pneumothorax. LEFT lower lobe volume loss. Tracheostomy tube good position. Cardiomegaly. No RIGHT lung pathology. PICC line unchanged. Multiple LEFT rib fractures unchanged. IMPRESSION: Stable chest.  No visible pneumothorax. Electronically Signed   By: Staci Righter M.D.   On: 01/15/2016 07:14    Anti-infectives: Anti-infectives    Start     Dose/Rate Route Frequency Ordered Stop   01/08/16 1000  ceFEPIme  (MAXIPIME) 2 g in dextrose 5 % 50 mL IVPB     2 g 100 mL/hr over 30 Minutes Intravenous Every 12 hours 01/08/16 0751     01/04/16 1000  piperacillin-tazobactam (ZOSYN) IVPB 3.375 g  Status:  Discontinued     3.375 g 12.5 mL/hr over 240 Minutes Intravenous Every 8 hours 01/04/16 0841 01/08/16 0751      Medications Scheduled Meds: . amiodarone  400 mg Oral BID  . bethanechol  10 mg Per Tube TID  . ceFEPime (MAXIPIME) IV  2 g Intravenous Q12H  . chlorhexidine  15 mL Mouth/Throat BID  . digoxin  0.125 mg Oral Daily  . enoxaparin (LOVENOX) injection  40 mg Subcutaneous Q24H  . famotidine (PEPCID) IV  20 mg Intravenous Q24H  . folic acid  1 mg Oral Daily  . free water  200 mL Per Tube Q4H  . furosemide  40 mg Intravenous BID  . insulin aspart  0-15 Units Subcutaneous Q4H  . mouth rinse  15 mL Mouth Rinse QID  . metoprolol tartrate  12.5 mg Oral BID  . midazolam  2 mg Intravenous Once  . midazolam  2 mg Intravenous Once  . multivitamin with minerals  1 tablet Oral Daily  . sertraline  50 mg Oral Daily  . sodium chloride flush  10-40 mL Intracatheter Q12H  . thiamine  100 mg Oral Daily   Or  . thiamine  100 mg Intravenous Daily   Continuous  Infusions: . dextrose 5 % with KCl 20 mEq / L 20 mEq (01/15/16 0100)  . feeding supplement (VITAL AF 1.2 CAL) 1,000 mL (01/14/16 2000)  . fentaNYL infusion INTRAVENOUS Stopped (01/13/16 0000)   PRN Meds:.acetaminophen, ALPRAZolam, artificial tears, bisacodyl, HYDROcodone-acetaminophen, HYDROmorphone (DILAUDID) injection, ipratropium, levalbuterol, ondansetron **OR** ondansetron (ZOFRAN) IV, sodium chloride flush  Assessment/Plan: s/p Procedure(s): PERCUTANEOUS TRACHEOSTOMY Fall Left sided rib fractures 3 through 12 with small pneumothorax- L Ct to water seal 8/26, check CXR now L3 and L4 transverse process fracture Left acetabular fracture- WBAT per Dr. Marcelino Scot  Vent dependent resp failure/COPD- back on trach collar today CHF/AF- on amio  and digoxin, KVO fluid, cardiology increasing diuretics ID- cefepime for H flu and e coli HCAP starting 8/21 Vascular insufficiency LLE- Dr. Bridgett Larsson has consulted, no changes FEN- TF, increased free water to 253ml q4h 8/26 ETOH abuse Dispo- ICU  LOS: 17 days   [facility] Surgeon [phone] Surgery 01/15/2016

## 2016-01-15 NOTE — Care Management Important Message (Signed)
Important Message  Patient Details  Name: Chad Rogers MRN: [id number] Date of Birth: [birth year]   Medicare Important Message Given:  Other (see comment)    [place], Jeannemarie Sawaya Abena 01/15/2016, 12:09 PM

## 2016-01-16 ENCOUNTER — Inpatient Hospital Stay (HOSPITAL_COMMUNITY): Payer: Medicare Other

## 2016-01-16 LAB — GLUCOSE, CAPILLARY
GLUCOSE-CAPILLARY: 115 mg/dL — AB (ref 65–99)
GLUCOSE-CAPILLARY: 128 mg/dL — AB (ref 65–99)
GLUCOSE-CAPILLARY: 132 mg/dL — AB (ref 65–99)
Glucose-Capillary: 107 mg/dL — ABNORMAL HIGH (ref 65–99)
Glucose-Capillary: 113 mg/dL — ABNORMAL HIGH (ref 65–99)
Glucose-Capillary: 123 mg/dL — ABNORMAL HIGH (ref 65–99)
Glucose-Capillary: 127 mg/dL — ABNORMAL HIGH (ref 65–99)

## 2016-01-16 MED ORDER — QUETIAPINE FUMARATE 100 MG PO TABS
50.0000 mg | ORAL_TABLET | Freq: Two times a day (BID) | ORAL | Status: DC
  Administered 2016-01-16 – 2016-01-19 (×7): 50 mg
  Filled 2016-01-16 (×7): qty 1

## 2016-01-16 NOTE — Progress Notes (Signed)
Spoke with pt's daughter,  Chad Rogers regarding possible LTAC discharge.  Daughter states they prefer [facility], as family lives closer to [facility].  Notified admissions liaison for [facility] [place], to evaluate and start prior authorization.    Reinaldo Raddle, RN, BSN  Trauma/Neuro ICU Case Manager [phone]

## 2016-01-16 NOTE — Progress Notes (Signed)
Left message for pt's daughter, [name], to discuss possible LTAC discharge and options for LTAC.  Reinaldo Raddle, RN, BSN  Trauma/Neuro ICU Case Manager [phone]

## 2016-01-16 NOTE — Progress Notes (Signed)
Patient ID: Chad Rogers, male   DOB: 03-Jun-1935, 80 y.o.   MRN: [id number] Follow up - Trauma Critical Care  Patient Details:    Chad Rogers is an 80 y.o. male.  Lines/tubes : PICC Triple Lumen [id number] PICC Right Basilic 38 cm 0 cm (Active)  Indication for Insertion or Continuance of Line Prolonged intravenous therapies 01/16/2016  8:00 AM  Exposed Catheter (cm) 0 cm 12/30/2015  3:21 PM  Site Assessment Clean;Intact;Dry 01/16/2016  8:00 AM  Lumen #1 Status Infusing 01/16/2016  8:00 AM  Lumen #2 Status Flushed;Blood return noted 01/16/2016  8:00 AM  Lumen #3 Status Flushed;Blood return noted 01/16/2016  8:00 AM  Dressing Type Transparent 01/16/2016  8:00 AM  Dressing Status Clean;Dry;Intact;Antimicrobial disc in place 01/16/2016  8:00 AM  Line Care Connections checked and tightened 01/16/2016  8:00 AM  Dressing Intervention New dressing;Dressing changed 01/13/2016  6:00 AM  Dressing Change Due 01/20/16 01/16/2016  8:00 AM     Chest Tube Left (Active)  Suction To water seal 01/16/2016  8:00 AM  Chest Tube Air Leak None 01/16/2016  8:00 AM  Patency Intervention Tip/tilt 01/15/2016  8:00 AM  Drainage Description Serous 01/16/2016  8:00 AM  Dressing Status Clean;Dry;Intact 01/16/2016  8:00 AM  Dressing Intervention Dressing reinforced 01/13/2016  7:00 PM  Site Assessment Clean;Dry;Intact 01/15/2016  8:00 PM  Surrounding Skin Unable to view 01/15/2016  8:00 PM  Output (mL) 0 mL 01/15/2016  8:00 AM     NG/OG Tube Nasogastric Right nare (Active)  Site Assessment Clean;Dry;Intact 01/16/2016  8:00 AM  Ongoing Placement Verification Auscultation 01/16/2016  8:00 AM  Status Infusing tube feed 01/16/2016  8:00 AM     External Urinary Catheter (Active)  Collection Container Standard drainage bag 01/16/2016  8:00 AM  Securement Method Other (Comment) 01/16/2016  8:00 AM  Output (mL) 150 mL 01/16/2016  8:00 AM    Microbiology/Sepsis markers: Results for orders placed or performed during the hospital  encounter of 12/28/15  MRSA PCR Screening     Status: None   Collection Time: 12/29/15  5:06 AM  Result Value Ref Range Status   MRSA by PCR NEGATIVE NEGATIVE Final    Comment:        The GeneXpert MRSA Assay (FDA approved for NASAL specimens only), is one component of a comprehensive MRSA colonization surveillance program. It is not intended to diagnose MRSA infection nor to guide or monitor treatment for MRSA infections.   Culture, respiratory (NON-Expectorated)     Status: None   Collection Time: 01/03/16 11:55 AM  Result Value Ref Range Status   Specimen Description TRACHEAL ASPIRATE  Final   Special Requests Normal  Final   Gram Stain   Final    ABUNDANT WBC PRESENT, PREDOMINANTLY PMN NO SQUAMOUS EPITHELIAL CELLS SEEN ABUNDANT GRAM NEGATIVE COCCOBACILLI FEW GRAM POSITIVE RODS FEW GRAM POSITIVE COCCI IN PAIRS RARE GRAM NEGATIVE RODS    Culture   Final    ABUNDANT HAEMOPHILUS INFLUENZAE BETA LACTAMASE NEGATIVE MODERATE ESCHERICHIA COLI    Report Status 01/06/2016 FINAL  Final   Organism ID, Bacteria ESCHERICHIA COLI  Final      Susceptibility   Escherichia coli - MIC*    AMPICILLIN <=2 SENSITIVE Sensitive     CEFAZOLIN <=4 SENSITIVE Sensitive     CEFEPIME <=1 SENSITIVE Sensitive     CEFTAZIDIME <=1 SENSITIVE Sensitive     CEFTRIAXONE <=1 SENSITIVE Sensitive     CIPROFLOXACIN <=0.25 SENSITIVE Sensitive     GENTAMICIN <=1  SENSITIVE Sensitive     IMIPENEM <=0.25 SENSITIVE Sensitive     TRIMETH/SULFA <=20 SENSITIVE Sensitive     AMPICILLIN/SULBACTAM <=2 SENSITIVE Sensitive     PIP/TAZO <=4 SENSITIVE Sensitive     Extended ESBL NEGATIVE Sensitive     * MODERATE ESCHERICHIA COLI    Anti-infectives:  Anti-infectives    Start     Dose/Rate Route Frequency Ordered Stop   01/08/16 1000  ceFEPIme (MAXIPIME) 2 g in dextrose 5 % 50 mL IVPB     2 g 100 mL/hr over 30 Minutes Intravenous Every 12 hours 01/08/16 0751 01/15/16 2143   01/04/16 1000  piperacillin-tazobactam  (ZOSYN) IVPB 3.375 g  Status:  Discontinued     3.375 g 12.5 mL/hr over 240 Minutes Intravenous Every 8 hours 01/04/16 0841 01/08/16 0751      Best Practice/Protocols:  VTE Prophylaxis: Lovenox (prophylaxtic dose) Continous Sedation  Consults: Treatment Team:  Altamese Goodwin, MD [facility], MD   Subjective:    Overnight Issues: ok  Objective:  Vital signs for last 24 hours: Temp:  [97.5 F (36.4 C)-98.3 F (36.8 C)] 97.6 F (36.4 C) (08/29 0800) Pulse Rate:  [25-125] 96 (08/29 0900) Resp:  [14-40] 37 (08/29 0900) BP: (93-136)/(53-90) 127/88 (08/29 0900) SpO2:  [91 %-100 %] 92 % (08/29 0900) FiO2 (%):  [40 %] 40 % (08/29 0800) Weight:  [85.7 kg (188 lb 15 oz)] 85.7 kg (188 lb 15 oz) (08/29 0500)  Hemodynamic parameters for last 24 hours:    Intake/Output from previous day: 08/28 0701 - 08/29 0700 In: 2610 [I.V.:30; NG/GT:2480; IV Piggyback:100] Out: 2900 [Urine:2900]  Intake/Output this shift: Total I/O In: 480 [I.V.:20; NG/GT:410; IV Piggyback:50] Out: 150 [Urine:150]  Vent settings for last 24 hours: Vent Mode: PRVC FiO2 (%):  [40 %] 40 % Set Rate:  [18 bmp] 18 bmp Vt Set:  [540 mL] 540 mL PEEP:  [5 cmH20] 5 cmH20 Pressure Support:  [10 cmH20] 10 cmH20  Physical Exam:  General: on HTC Neuro: some agitation, F/C HEENT/Neck: trach-clean, intact Resp: some rhonchi B CVS: irreg GI: soft, NT  Results for orders placed or performed during the hospital encounter of 12/28/15 (from the past 24 hour(s))  Glucose, capillary     Status: Abnormal   Collection Time: 01/15/16 12:15 PM  Result Value Ref Range   Glucose-Capillary 152 (H) 65 - 99 mg/dL   Comment 1 Notify RN    Comment 2 Document in Chart   Glucose, capillary     Status: Abnormal   Collection Time: 01/15/16  3:31 PM  Result Value Ref Range   Glucose-Capillary 127 (H) 65 - 99 mg/dL  Glucose, capillary     Status: Abnormal   Collection Time: 01/15/16  8:08 PM  Result Value Ref Range    Glucose-Capillary 119 (H) 65 - 99 mg/dL  Glucose, capillary     Status: Abnormal   Collection Time: 01/16/16 12:10 AM  Result Value Ref Range   Glucose-Capillary 127 (H) 65 - 99 mg/dL  Glucose, capillary     Status: Abnormal   Collection Time: 01/16/16  3:47 AM  Result Value Ref Range   Glucose-Capillary 113 (H) 65 - 99 mg/dL  Glucose, capillary     Status: Abnormal   Collection Time: 01/16/16  7:48 AM  Result Value Ref Range   Glucose-Capillary 123 (H) 65 - 99 mg/dL    Assessment & Plan: Present on Admission: . Traumatic fracture of ribs with pneumothorax . Multiple rib fractures  LOS: 18 days   Additional comments:I reviewed the patient's new clinical lab test results. . Fall Left sided rib fractures 3 through 12 with small pneumothorax- L CT may be able to come out - check CXR L3 and L4 transverse process fracture Left acetabular fracture- WBAT per Dr. Marcelino Scot  Vent dependent resp failure/COPD- wean on trach collar today, add seroquel to help weaning CHF/AF- on amio and digoxin, appreciate cardiology F/U ID- completed cefepime for H flu and e coli HCAP Vascular insufficiency LLE- Dr. Bridgett Larsson has consulted, no changes FEN- TF, Na better, BMET in AM ETOH abuse Dispo- ICU Critical Care Total Time*: 32 Minutes  Georganna Skeans, MD, MPH, FACS Trauma: [phone] General Surgery: [phone]  01/16/2016  *Care during the described time interval was provided by me. I have reviewed this patient's available data, including medical history, events of note, physical examination and test results as part of my evaluation.

## 2016-01-16 NOTE — Progress Notes (Signed)
Patient Name: Chad Rogers Date of Encounter: 01/16/2016  Hospital Problem List     Active Problems:   Cardiomyopathy [facility])   Traumatic fracture of ribs with pneumothorax   Multiple rib fractures   Respiratory failure (HCC)   Acute on chronic systolic CHF (congestive heart failure) (HCC)   NSVT (nonsustained ventricular tachycardia) (HCC)   Absent pulse in lower extremity   Atrial fibrillation, persistent [facility])    Patient Profile     80 yo male with PMH of ETOH/Tobacco abuse/NICM EF 15-20% 2014/COPD/HTN/OA and Pulmonary Fibrosis who presented to [facility] after a fall at home. Found to have multiple left-sided rib fractures, L3-4 transverse process fracture, left acetabular fracture and left pneumothorax. He was transferred to [facility] under trauma services. He has had vent dependent resp failure and is now status post trach.  Has had atrial fib with RVR managed with IV amiodarone initially.   Subjective   Resting, responds to verbal stimuli.  Inpatient Medications    . amiodarone  400 mg Oral BID  . bethanechol  10 mg Per Tube TID  . chlorhexidine  15 mL Mouth/Throat BID  . digoxin  0.125 mg Oral Daily  . enoxaparin (LOVENOX) injection  40 mg Subcutaneous Q24H  . famotidine (PEPCID) IV  20 mg Intravenous Q24H  . folic acid  1 mg Oral Daily  . free water  200 mL Per Tube Q4H  . furosemide  40 mg Intravenous BID  . insulin aspart  0-15 Units Subcutaneous Q4H  . mouth rinse  15 mL Mouth Rinse QID  . metoprolol tartrate  12.5 mg Oral BID  . midazolam  2 mg Intravenous Once  . midazolam  2 mg Intravenous Once  . multivitamin with minerals  1 tablet Oral Daily  . QUEtiapine  50 mg Per Tube BID  . sertraline  50 mg Oral Daily  . sodium chloride flush  10-40 mL Intracatheter Q12H  . thiamine  100 mg Oral Daily   Or  . thiamine  100 mg Intravenous Daily    Vital Signs    Vitals:   01/16/16 1000 01/16/16 1100 01/16/16 1200 01/16/16 1300  BP: 110/65 (!)  113/59  118/67  Pulse:  82 87 (!) 56  Resp: (!) 27 (!) 22 (!) 22 (!) 23  Temp:   97.6 F (36.4 C)   TempSrc:   Axillary   SpO2: 97% 98% 99% 100%  Weight:      Height:        Intake/Output Summary (Last 24 hours) at 01/16/16 1327 Last data filed at 01/16/16 1300  Gross per 24 hour  Intake             3110 ml  Output             1990 ml  Net             1120 ml   Filed Weights   01/14/16 0500 01/15/16 0500 01/16/16 0500  Weight: 195 lb 12.3 oz (88.8 kg) 192 lb 10.9 oz (87.4 kg) 188 lb 15 oz (85.7 kg)    Physical Exam    GEN: Well nourished, well developed, in  no acute distress.  Neck: Supple, no JVD, carotid bruits, or masses. Trach Cardiac: Irregular, no murmurs, rubs, or gallops. No clubbing, cyanosis, mild ankle edema.  Radials/DP/PT 2+ and equal bilaterally.  Respiratory:  Respirations  regular and unlabored, decreased breath sounds.  GI: Soft, nontender, nondistended, BS + x 4. Neuro:  Strength and sensation are intact.   Labs    CBC  Recent Labs  01/15/16 0534  WBC 6.3  HGB 9.2*  HCT 30.6*  MCV 108.9*  PLT [id number]   Basic Metabolic Panel  Recent Labs  01/15/16 0534  NA 143  K 3.5  CL 104  CO2 32  GLUCOSE 119*  BUN 39*  CREATININE 0.73  CALCIUM 8.3*   Liver Function Tests No results for input(s): AST, ALT, ALKPHOS, BILITOT, PROT, ALBUMIN in the last 72 hours. No results for input(s): LIPASE, AMYLASE in the last 72 hours. Cardiac Enzymes No results for input(s): CKTOTAL, CKMB, CKMBINDEX, TROPONINI in the last 72 hours. BNP Invalid input(s): POCBNP D-Dimer No results for input(s): DDIMER in the last 72 hours. Hemoglobin A1C No results for input(s): HGBA1C in the last 72 hours. Fasting Lipid Panel No results for input(s): CHOL, HDL, LDLCALC, TRIG, CHOLHDL, LDLDIRECT in the last 72 hours. Thyroid Function Tests No results for input(s): TSH, T4TOTAL, T3FREE, THYROIDAB in the last 72 hours.  Invalid input(s): FREET3  Telemetry    Atrial fib  rate controlled  ECG    NA  Radiology    Dg Chest Port 1 View  Result Date: 01/16/2016 CLINICAL DATA:  History of left pneumothorax, followup EXAM: PORTABLE CHEST 1 VIEW COMPARISON:  Portable chest x-ray of 01/15/2016 and CT chest of 12/28/2015 FINDINGS: There has been worsening of airspace disease throughout the lungs right greater than left which could be due to edema or pneumonia. Small effusions cannot be excluded. A left chest tube remains and no left pneumothorax is seen although there again multiple left rib fractures are identified. Feeding tube and tracheostomy remain. Right PICC line is unchanged in position with the tip overlying the mid SVC. IMPRESSION: 1. Worsening of diffuse airspace disease. Consider edema versus pneumonia. Cannot exclude small effusions. 2. Left chest tube remains.  No definite left pneumothorax. 3. Multiple left rib fractures again are noted. Electronically Signed   By: Ivar Drape M.D.   On: 01/16/2016 10:18    Assessment & Plan    1. ATRIAL FIB:  Rate remains controlled on oral amiodarone.  Continue current therapy.   Mr. JEVIN SAILORS has a CHA2DS2 - VASc score of 5 with a risk of stroke of 6.7%.   Needs to be on systemic anticoagulation when we are sure that there are no further invasive procedures planned and no active bleeding issues if he is thought to be at low fall risk in the future.  (This would depend on his condition after he has recovered from acute events and has had rehab.)  2. ACUTE ON CHRONIC SYSTOLIC HF:   Continue current IV diuresis.  Follow daily BMETs. K+, and Cr stable this morning. Weight with downward trend.   Signed, Reino Bellis, NP  01/16/2016, 1:27 PM    History and all data above reviewed.  Patient examined.  I agree with the findings as above.  The patient is sleeping comfortably.  No dstress.  The patient exam reveals [id number]  ,  Lungs: Clear  ,  Abd: Positive bowel sounds, no rebound no guarding, Ext Diffuse  moderate edema  .  All available labs, radiology testing, previous records reviewed. Agree with documented assessment and plan. Atrial fib:  Rate controlled.  Continue current therapy.  Acute on chronic systolic HF:  Diffuse edema in part related to decreased albumin (mildly low) and high volume in NG nutrition.  Can the free water in this be decreased.  His BUN is increased which would prerenal conditions.   Macyn Early Ord  4:10 PM  01/16/2016

## 2016-01-17 ENCOUNTER — Inpatient Hospital Stay (HOSPITAL_COMMUNITY): Payer: Medicare Other

## 2016-01-17 LAB — CBC
HEMATOCRIT: 31.8 % — AB (ref 39.0–52.0)
HEMOGLOBIN: 9.7 g/dL — AB (ref 13.0–17.0)
MCH: 32.7 pg (ref 26.0–34.0)
MCHC: 30.5 g/dL (ref 30.0–36.0)
MCV: 107.1 fL — AB (ref 78.0–100.0)
Platelets: 203 10*3/uL (ref 150–400)
RBC: 2.97 MIL/uL — AB (ref 4.22–5.81)
RDW: 16.3 % — AB (ref 11.5–15.5)
WBC: 8.8 10*3/uL (ref 4.0–10.5)

## 2016-01-17 LAB — BASIC METABOLIC PANEL
Anion gap: 7 (ref 5–15)
BUN: 33 mg/dL — ABNORMAL HIGH (ref 6–20)
CHLORIDE: 99 mmol/L — AB (ref 101–111)
CO2: 35 mmol/L — ABNORMAL HIGH (ref 22–32)
Calcium: 8.6 mg/dL — ABNORMAL LOW (ref 8.9–10.3)
Creatinine, Ser: 0.62 mg/dL (ref 0.61–1.24)
GFR calc non Af Amer: >60 mL/min (ref >60)
Glucose, Bld: 107 mg/dL — ABNORMAL HIGH (ref 65–99)
POTASSIUM: 3.6 mmol/L (ref 3.5–5.1)
SODIUM: 141 mmol/L (ref 135–145)

## 2016-01-17 LAB — GLUCOSE, CAPILLARY
GLUCOSE-CAPILLARY: 121 mg/dL — AB (ref 65–99)
GLUCOSE-CAPILLARY: 105 mg/dL — AB (ref 65–99)
GLUCOSE-CAPILLARY: 113 mg/dL — AB (ref 65–99)
Glucose-Capillary: 111 mg/dL — ABNORMAL HIGH (ref 65–99)

## 2016-01-17 MED ORDER — FREE WATER
200.0000 mL | Freq: Three times a day (TID) | Status: DC
  Administered 2016-01-17 – 2016-01-19 (×6): 200 mL

## 2016-01-17 MED ORDER — MIDAZOLAM HCL 2 MG/2ML IJ SOLN
INTRAMUSCULAR | Status: AC
  Filled 2016-01-17: qty 2

## 2016-01-17 MED ORDER — FAMOTIDINE 40 MG/5ML PO SUSR
20.0000 mg | Freq: Every day | ORAL | Status: DC
  Administered 2016-01-17 – 2016-01-19 (×3): 20 mg
  Filled 2016-01-17 (×3): qty 2.5

## 2016-01-17 MED ORDER — FENTANYL CITRATE (PF) 100 MCG/2ML IJ SOLN
INTRAMUSCULAR | Status: AC
  Filled 2016-01-17: qty 2

## 2016-01-17 MED ORDER — ALTEPLASE 2 MG IJ SOLR
2.0000 mg | Freq: Once | INTRAMUSCULAR | Status: AC
  Administered 2016-01-17: 2 mg

## 2016-01-17 NOTE — Evaluation (Signed)
Physical Therapy Evaluation Patient Details Name: Chad Rogers MRN: [id number] DOB: [birth year] Today's Date: 01/17/2016   History of Present Illness  80 yo male with PMH of ETOH/Tobacco abuse/NICM EF 15-20% 2014/COPD/HTN/OA and Pulmonary Fibrosis who presented to [facility] after a fall at home. Found to have multiple left-sided rib fractures, L3-4 transverse process fracture, left acetabular fracture and left pneumothorax. Patient s/p trach.  Clinical Impression  Patient demonstrates deficits in functional mobility as indicated below. Will need continued skilled PT to address deficits and maximize function. Will see as indicated and progress as tolerated. Recommend LTACH upon acute discharge.    Follow Up Recommendations LTACH;Supervision/Assistance - 24 hour    Equipment Recommendations  Other (comment) (defer to next venur)    Recommendations for Other Services       Precautions / Restrictions Precautions Precautions: Fall Precaution Comments: trach/vent dependent Restrictions Weight Bearing Restrictions: Yes LLE Weight Bearing: Weight bearing as tolerated      Mobility  Bed Mobility Overal bed mobility: Needs Assistance;+2 for physical assistance;+ 2 for safety/equipment Bed Mobility: Rolling;Supine to Sit;Sit to Supine Rolling: Max assist;+2 for physical assistance   Supine to sit: Total assist;+2 for physical assistance Sit to supine: Total assist;+2 for physical assistance   General bed mobility comments: Increased assist to roll toward the left, cues for hand placement and positioning. Total assist via helicopter technique to come to EOB and return to supine  Transfers                 General transfer comment: unable to perform today  Ambulation/Gait                Stairs            Wheelchair Mobility    Modified Rankin (Stroke Patients Only)       Balance Overall balance assessment: Needs assistance Sitting-balance  support: Feet supported Sitting balance-Leahy Scale: Zero Sitting balance - Comments: max to total assist EOB Postural control: Posterior lean                                   Pertinent Vitals/Pain Pain Assessment: (P) Faces Faces Pain Scale: Hurts even more Pain Location: unable to discern (grimacing with hygiene and pericare as well as mobility Pain Descriptors / Indicators: Grimacing Pain Intervention(s): Monitored during session    Home Living Family/patient expects to be discharged to:: Skilled nursing facility Living Arrangements: Spouse/significant other                    Prior Function                 Hand Dominance        Extremity/Trunk Assessment   Upper Extremity Assessment: Generalized weakness           Lower Extremity Assessment: Generalized weakness;RLE deficits/detail RLE Deficits / Details: RLE weakness noted with assessment distal motions       Communication   Communication: Tracheostomy  Cognition Arousal/Alertness: Awake/alert Behavior During Therapy: Flat affect Overall Cognitive Status: Difficult to assess                      General Comments      Exercises        Assessment/Plan    PT Assessment Patient needs continued PT services  PT Diagnosis Difficulty walking;Abnormality of gait;Generalized weakness;Acute pain   PT Problem List  Decreased strength;Decreased activity tolerance;Decreased balance;Decreased mobility;Decreased coordination;Decreased cognition;Decreased safety awareness;Cardiopulmonary status limiting activity;Pain  PT Treatment Interventions DME instruction;Gait training;Functional mobility training;Therapeutic activities;Therapeutic exercise;Balance training;Cognitive remediation;Patient/family education   PT Goals (Current goals can be found in the Care Plan section) Acute Rehab PT Goals Patient Stated Goal: unable to state PT Goal Formulation: Patient unable to participate  in goal setting Time For Goal Achievement: 01/31/16 Potential to Achieve Goals: Fair    Frequency Min 2X/week   Barriers to discharge        Co-evaluation PT/OT/SLP Co-Evaluation/Treatment: Yes Reason for Co-Treatment: Complexity of the patient's impairments (multi-system involvement);Necessary to address cognition/behavior during functional activity;For patient/therapist safety PT goals addressed during session: Mobility/safety with mobility;Balance OT goals addressed during session: ADL's and self-care       End of Session Equipment Utilized During Treatment: Oxygen Activity Tolerance: Patient limited by fatigue;Treatment limited secondary to medical complications (Comment) (+dizziness at EOB with eyes rolling back ) Patient left: in bed;with call bell/phone within reach;with bed alarm set;with nursing/sitter in room Nurse Communication: Mobility status         Time: 1203-1226 PT Time Calculation (min) (ACUTE ONLY): 23 min   Charges:   PT Evaluation $PT Eval High Complexity: 1 Procedure     PT G CodesDuncan Dull 01-24-2016, 1:40 PM Alben Deeds, Ness DPT  [phone]

## 2016-01-17 NOTE — Progress Notes (Signed)
Patient ID: Chad Rogers, male   DOB: [birth year], 80 y.o.   MRN: [id number] Follow up - Trauma Critical Care  Patient Details:    Chad Rogers is an 80 y.o. male.  Lines/tubes : PICC Triple Lumen [id number] PICC Right Basilic 38 cm 0 cm (Active)  Indication for Insertion or Continuance of Line Prolonged intravenous therapies 01/16/2016  8:00 PM  Exposed Catheter (cm) 0 cm 12/30/2015  3:21 PM  Site Assessment Clean;Dry;Intact 01/16/2016  8:00 PM  Lumen #1 Status Infusing 01/16/2016  8:00 PM  Lumen #2 Status Flushed;Blood return noted 01/16/2016  8:00 PM  Lumen #3 Status Flushed;Blood return noted 01/16/2016  8:00 PM  Dressing Type Transparent 01/16/2016  8:00 PM  Dressing Status Clean;Dry;Intact;Antimicrobial disc in place 01/16/2016  8:00 PM  Line Care Connections checked and tightened 01/16/2016  8:00 PM  Dressing Intervention New dressing;Dressing changed 01/13/2016  6:00 AM  Dressing Change Due 01/20/16 01/16/2016  8:00 PM     External Urinary Catheter (Active)  Collection Container Standard drainage bag 01/16/2016  8:00 PM  Securement Method Other (Comment) 01/16/2016 12:00 PM  Output (mL) 400 mL 01/17/2016  4:00 AM    Microbiology/Sepsis markers: Results for orders placed or performed during the hospital encounter of 12/28/15  MRSA PCR Screening     Status: None   Collection Time: 12/29/15  5:06 AM  Result Value Ref Range Status   MRSA by PCR NEGATIVE NEGATIVE Final    Comment:        The GeneXpert MRSA Assay (FDA approved for NASAL specimens only), is one component of a comprehensive MRSA colonization surveillance program. It is not intended to diagnose MRSA infection nor to guide or monitor treatment for MRSA infections.   Culture, respiratory (NON-Expectorated)     Status: None   Collection Time: 01/03/16 11:55 AM  Result Value Ref Range Status   Specimen Description TRACHEAL ASPIRATE  Final   Special Requests Normal  Final   Gram Stain   Final    ABUNDANT WBC  PRESENT, PREDOMINANTLY PMN NO SQUAMOUS EPITHELIAL CELLS SEEN ABUNDANT GRAM NEGATIVE COCCOBACILLI FEW GRAM POSITIVE RODS FEW GRAM POSITIVE COCCI IN PAIRS RARE GRAM NEGATIVE RODS    Culture   Final    ABUNDANT HAEMOPHILUS INFLUENZAE BETA LACTAMASE NEGATIVE MODERATE ESCHERICHIA COLI    Report Status 01/06/2016 FINAL  Final   Organism ID, Bacteria ESCHERICHIA COLI  Final      Susceptibility   Escherichia coli - MIC*    AMPICILLIN <=2 SENSITIVE Sensitive     CEFAZOLIN <=4 SENSITIVE Sensitive     CEFEPIME <=1 SENSITIVE Sensitive     CEFTAZIDIME <=1 SENSITIVE Sensitive     CEFTRIAXONE <=1 SENSITIVE Sensitive     CIPROFLOXACIN <=0.25 SENSITIVE Sensitive     GENTAMICIN <=1 SENSITIVE Sensitive     IMIPENEM <=0.25 SENSITIVE Sensitive     TRIMETH/SULFA <=20 SENSITIVE Sensitive     AMPICILLIN/SULBACTAM <=2 SENSITIVE Sensitive     PIP/TAZO <=4 SENSITIVE Sensitive     Extended ESBL NEGATIVE Sensitive     * MODERATE ESCHERICHIA COLI    Anti-infectives:  Anti-infectives    Start     Dose/Rate Route Frequency Ordered Stop   01/08/16 1000  ceFEPIme (MAXIPIME) 2 g in dextrose 5 % 50 mL IVPB     2 g 100 mL/hr over 30 Minutes Intravenous Every 12 hours 01/08/16 0751 01/15/16 2143   01/04/16 1000  piperacillin-tazobactam (ZOSYN) IVPB 3.375 g  Status:  Discontinued     3.375 g  12.5 mL/hr over 240 Minutes Intravenous Every 8 hours 01/04/16 0841 01/08/16 0751      Best Practice/Protocols:  VTE Prophylaxis: Lovenox (prophylaxtic dose) Continous Sedation  Consults: Treatment Team:  Altamese Calvert, MD [facility], MD    Studies:CXR - less edema, no PTX Subjective:    Overnight Issues:  Pulled cortrak Objective:  Vital signs for last 24 hours: Temp:  [97.6 F (36.4 C)-98.7 F (37.1 C)] 98.3 F (36.8 C) (08/30 0400) Pulse Rate:  [49-105] 83 (08/30 0800) Resp:  [17-40] 28 (08/30 0800) BP: (90-129)/(48-88) 110/68 (08/30 0800) SpO2:  [92 %-100 %] 100 % (08/30 0800) FiO2  (%):  [40 %] 40 % (08/30 0800) Weight:  [88.8 kg (195 lb 12.3 oz)] 88.8 kg (195 lb 12.3 oz) (08/30 0225)  Hemodynamic parameters for last 24 hours:    Intake/Output from previous day: 08/29 0701 - 08/30 0700 In: 3230 [I.V.:240; NG/GT:2940; IV Piggyback:50] Out: 2300 [Urine:2300]  Intake/Output this shift: Total I/O In: 45 [I.V.:10; NG/GT:35] Out: -   Vent settings for last 24 hours: Vent Mode: PRVC FiO2 (%):  [40 %] 40 % Set Rate:  [18 bmp] 18 bmp Vt Set:  [540 mL] 540 mL PEEP:  [5 cmH20] 5 cmH20  Physical Exam:  General: no distress Neuro: F/C HEENT/Neck: trach in place Resp: few rales CVS: IRR GI: soft, NT, ND  Results for orders placed or performed during the hospital encounter of 12/28/15 (from the past 24 hour(s))  Glucose, capillary     Status: Abnormal   Collection Time: 01/16/16 11:59 AM  Result Value Ref Range   Glucose-Capillary 115 (H) 65 - 99 mg/dL  Glucose, capillary     Status: Abnormal   Collection Time: 01/16/16  3:21 PM  Result Value Ref Range   Glucose-Capillary 128 (H) 65 - 99 mg/dL   Comment 1 Notify RN    Comment 2 Document in Chart   Glucose, capillary     Status: Abnormal   Collection Time: 01/16/16  8:02 PM  Result Value Ref Range   Glucose-Capillary 107 (H) 65 - 99 mg/dL  Glucose, capillary     Status: Abnormal   Collection Time: 01/16/16 11:30 PM  Result Value Ref Range   Glucose-Capillary 132 (H) 65 - 99 mg/dL  Glucose, capillary     Status: Abnormal   Collection Time: 01/17/16  3:31 AM  Result Value Ref Range   Glucose-Capillary 121 (H) 65 - 99 mg/dL  CBC     Status: Abnormal   Collection Time: 01/17/16  5:12 AM  Result Value Ref Range   WBC 8.8 4.0 - 10.5 K/uL   RBC 2.97 (L) 4.22 - 5.81 MIL/uL   Hemoglobin 9.7 (L) 13.0 - 17.0 g/dL   HCT 31.8 (L) 39.0 - 52.0 %   MCV 107.1 (H) 78.0 - 100.0 fL   MCH 32.7 26.0 - 34.0 pg   MCHC 30.5 30.0 - 36.0 g/dL   RDW 16.3 (H) 11.5 - 15.5 %   Platelets 203 150 - 400 K/uL  Basic metabolic  panel     Status: Abnormal   Collection Time: 01/17/16  5:12 AM  Result Value Ref Range   Sodium 141 135 - 145 mmol/L   Potassium 3.6 3.5 - 5.1 mmol/L   Chloride 99 (L) 101 - 111 mmol/L   CO2 35 (H) 22 - 32 mmol/L   Glucose, Bld 107 (H) 65 - 99 mg/dL   BUN 33 (H) 6 - 20 mg/dL   Creatinine, Ser 0.62 0.61 -  1.24 mg/dL   Calcium 8.6 (L) 8.9 - 10.3 mg/dL   GFR calc non Af Amer >60 >60 mL/min   GFR calc Af Amer >60 >60 mL/min   Anion gap 7 5 - 15    Assessment & Plan: Present on Admission: . Traumatic fracture of ribs with pneumothorax . Multiple rib fractures    LOS: 19 days   Additional comments:I reviewed the patient's new clinical lab test results. and CXR Fall Left sided rib fractures 3 through 12 with small pneumothorax- L CT may be able to come out - check CXR L3 and L4 transverse process fracture Left acetabular fracture- WBAT per Dr. Gena Fray dependent resp failure/COPD- wean on trach collar again today, seroquel to help weaning CHF/AF- on amio and digoxin, appreciate cardiology F/U Vascular insufficiency LLE- Dr. Bridgett Larsson has consulted, no changes FEN- TF, decrease free water ETOH abuse Dispo- ICU, LTACH referral Critical Care Total Time*: 30 Minutes  Georganna Skeans, MD, MPH, FACS Trauma: [phone] General Surgery: [phone]  01/17/2016  *Care during the described time interval was provided by me. I have reviewed this patient's available data, including medical history, events of note, physical examination and test results as part of my evaluation.

## 2016-01-17 NOTE — Evaluation (Signed)
Occupational Therapy Evaluation Patient Details Name: Chad Rogers MRN: [id number] DOB: 1935-08-13 Today's Date: 01/17/2016    History of Present Illness 80 yo male with PMH of ETOH/Tobacco abuse/NICM EF 15-20% 2014/COPD/HTN/OA and Pulmonary Fibrosis who presented to [facility] after a fall at home. Found to have multiple left-sided rib fractures, L3-4 transverse process fracture, left acetabular fracture and left pneumothorax. Patient s/p trach.   Clinical Impression    Pt currently requires max-total assist +2 for bed mobility. Able to perform washing face in sitting with max support for balance and support at elbow. Pt appropriately following one step commands. Recommending LTACH for follow up. Pt would benefit from continued skilled OT to address established goals.    Follow Up Recommendations  LTACH;Supervision/Assistance - 24 hour    Equipment Recommendations  Other (comment) (TBD at next venue)    Recommendations for Other Services       Precautions / Restrictions Precautions Precautions: Fall Precaution Comments: trach/vent dependent Restrictions Weight Bearing Restrictions: Yes LLE Weight Bearing: Weight bearing as tolerated      Mobility Bed Mobility Overal bed mobility: Needs Assistance;+2 for physical assistance;+ 2 for safety/equipment Bed Mobility: Rolling;Supine to Sit;Sit to Supine Rolling: Max assist;+2 for physical assistance   Supine to sit: Total assist;+2 for physical assistance Sit to supine: Total assist;+2 for physical assistance   General bed mobility comments: Increased assist to roll toward the left, cues for hand placement and positioning. Total assist via helicopter technique to come to EOB and return to supine  Transfers                 General transfer comment: unable to perform today    Balance Overall balance assessment: Needs assistance Sitting-balance support: Feet supported Sitting balance-Leahy Scale:  Zero Sitting balance - Comments: max to total assist EOB Postural control: Posterior lean                                  ADL Overall ADL's : Needs assistance/impaired Eating/Feeding: NPO   Grooming: Maximal assistance;Sitting;Wash/dry face Grooming Details (indicate cue type and reason): Max assist for sitting balance. Pt able to wipe face x5 with support at elbow and cueing.  Upper Body Bathing: Total assistance;Bed level   Lower Body Bathing: Total assistance;Bed level   Upper Body Dressing : Total assistance;Bed level Upper Body Dressing Details (indicate cue type and reason): to doff/don hospital gown. Lower Body Dressing: Total assistance;Bed level                 General ADL Comments: Pt limited to bed mobility this session. Pt following commands appropriately and attemping to assisting with rolling at bed level with use of UEs..      Vision Additional Comments: Difficult to assess due to trach.   Perception     Praxis      Pertinent Vitals/Pain Pain Assessment: Faces Faces Pain Scale: Hurts even more Pain Location: unable to discern (grimacing with hygiene and pericare as well as mobility Pain Descriptors / Indicators: Grimacing Pain Intervention(s): Monitored during session     Hand Dominance     Extremity/Trunk Assessment Upper Extremity Assessment Upper Extremity Assessment: Generalized weakness   Lower Extremity Assessment Lower Extremity Assessment: Defer to PT evaluation        Communication Communication Communication: Tracheostomy   Cognition Arousal/Alertness: Awake/alert Behavior During Therapy: Flat affect Overall Cognitive Status: Difficult to assess  General Comments       Exercises       Shoulder Instructions      Home Living Family/patient expects to be discharged to:: Skilled nursing facility Living Arrangements: Spouse/significant other                                       Prior Functioning/Environment               OT Diagnosis: Generalized weakness;Acute pain   OT Problem List: Decreased strength;Decreased range of motion;Decreased activity tolerance;Impaired balance (sitting and/or standing);Decreased coordination;Decreased cognition;Decreased safety awareness;Decreased knowledge of use of DME or AE;Decreased knowledge of precautions;Impaired UE functional use;Pain   OT Treatment/Interventions: Self-care/ADL training;Therapeutic exercise;Energy conservation;DME and/or AE instruction;Therapeutic activities;Patient/family education;Balance training    OT Goals(Current goals can be found in the care plan section) Acute Rehab OT Goals Patient Stated Goal: unable to state OT Goal Formulation: Patient unable to participate in goal setting Time For Goal Achievement: 01/31/16 Potential to Achieve Goals: Fair ADL Goals Pt Will Perform Grooming: sitting;with min assist Pt Will Transfer to Toilet: with mod assist;stand pivot transfer;bedside commode Pt/caregiver will Perform Home Exercise Program: Increased ROM;Increased strength;Both right and left upper extremity;With minimal assist;With written HEP provided Additional ADL Goal #1: Pt will sit EOB with min assist for 5 minutes as precursor to ADL and functional mobility.  OT Frequency: Min 2X/week   Barriers to D/C:            Co-evaluation PT/OT/SLP Co-Evaluation/Treatment: Yes Reason for Co-Treatment: Complexity of the patient's impairments (multi-system involvement);Necessary to address cognition/behavior during functional activity;For patient/therapist safety PT goals addressed during session: Mobility/safety with mobility;Balance OT goals addressed during session: ADL's and self-care      End of Session Nurse Communication: Mobility status (RN present and assisting)  Activity Tolerance: Patient tolerated treatment well Patient left: in bed;with call bell/phone within reach    Time: 1208-1228 OT Time Calculation (min): 20 min Charges:  OT General Charges $OT Visit: 1 Procedure OT Evaluation $OT Eval High Complexity: 1 Procedure G-Codes:     Binnie Kand M.S., OTR/L Pager: [phone]  01/17/2016, 1:54 PM

## 2016-01-18 LAB — BASIC METABOLIC PANEL
Anion gap: 8 (ref 5–15)
BUN: 32 mg/dL — AB (ref 6–20)
CALCIUM: 8.4 mg/dL — AB (ref 8.9–10.3)
CO2: 33 mmol/L — ABNORMAL HIGH (ref 22–32)
CREATININE: 0.7 mg/dL (ref 0.61–1.24)
Chloride: 98 mmol/L — ABNORMAL LOW (ref 101–111)
GFR calc Af Amer: >60 mL/min (ref >60)
Glucose, Bld: 152 mg/dL — ABNORMAL HIGH (ref 65–99)
POTASSIUM: 3.6 mmol/L (ref 3.5–5.1)
SODIUM: 139 mmol/L (ref 135–145)

## 2016-01-18 LAB — GLUCOSE, CAPILLARY
GLUCOSE-CAPILLARY: 131 mg/dL — AB (ref 65–99)
GLUCOSE-CAPILLARY: 142 mg/dL — AB (ref 65–99)
GLUCOSE-CAPILLARY: 148 mg/dL — AB (ref 65–99)
Glucose-Capillary: 100 mg/dL — ABNORMAL HIGH (ref 65–99)
Glucose-Capillary: 112 mg/dL — ABNORMAL HIGH (ref 65–99)
Glucose-Capillary: 133 mg/dL — ABNORMAL HIGH (ref 65–99)
Glucose-Capillary: 211 mg/dL — ABNORMAL HIGH (ref 65–99)

## 2016-01-18 NOTE — Care Management Important Message (Signed)
Important Message  Patient Details  Name: Chad Rogers MRN: [id number] Date of Birth: [birth year]   Medicare Important Message Given:  Yes    [name] 01/18/2016, 10:50 AM

## 2016-01-18 NOTE — Progress Notes (Signed)
Patient ID: Chad Rogers, male   DOB: November 29, 1935, 80 y.o.   MRN: [id number] Follow up - Trauma Critical Care  Patient Details:    Chad Rogers is an 80 y.o. male.  Lines/tubes : PICC Triple Lumen [id number] PICC Right Basilic 38 cm 0 cm (Active)  Indication for Insertion or Continuance of Line Prolonged intravenous therapies 01/17/2016  8:00 PM  Exposed Catheter (cm) 0 cm 12/30/2015  3:21 PM  Site Assessment Clean;Dry;Intact 01/17/2016  8:00 PM  Lumen #1 Status Infusing 01/17/2016  8:00 PM  Lumen #2 Status Flushed;Blood return noted 01/17/2016  8:00 PM  Lumen #3 Status Blood return noted;Flushed;Saline locked 01/18/2016  3:52 AM  Dressing Type Transparent 01/17/2016  8:00 PM  Dressing Status Clean;Dry;Intact;Antimicrobial disc in place 01/17/2016  8:00 PM  Line Care Cap(s) changed 01/18/2016  3:52 AM  Dressing Intervention New dressing;Dressing changed 01/13/2016  6:00 AM  Dressing Change Due 01/20/16 01/17/2016  8:00 PM     External Urinary Catheter (Active)  Collection Container Standard drainage bag 01/17/2016  8:00 AM  Securement Method Other (Comment) 01/16/2016 12:00 PM  Output (mL) 425 mL 01/18/2016  6:01 AM    Microbiology/Sepsis markers: Results for orders placed or performed during the hospital encounter of 12/28/15  MRSA PCR Screening     Status: None   Collection Time: 12/29/15  5:06 AM  Result Value Ref Range Status   MRSA by PCR NEGATIVE NEGATIVE Final    Comment:        The GeneXpert MRSA Assay (FDA approved for NASAL specimens only), is one component of a comprehensive MRSA colonization surveillance program. It is not intended to diagnose MRSA infection nor to guide or monitor treatment for MRSA infections.   Culture, respiratory (NON-Expectorated)     Status: None   Collection Time: 01/03/16 11:55 AM  Result Value Ref Range Status   Specimen Description TRACHEAL ASPIRATE  Final   Special Requests Normal  Final   Gram Stain   Final    ABUNDANT WBC PRESENT,  PREDOMINANTLY PMN NO SQUAMOUS EPITHELIAL CELLS SEEN ABUNDANT GRAM NEGATIVE COCCOBACILLI FEW GRAM POSITIVE RODS FEW GRAM POSITIVE COCCI IN PAIRS RARE GRAM NEGATIVE RODS    Culture   Final    ABUNDANT HAEMOPHILUS INFLUENZAE BETA LACTAMASE NEGATIVE MODERATE ESCHERICHIA COLI    Report Status 01/06/2016 FINAL  Final   Organism ID, Bacteria ESCHERICHIA COLI  Final      Susceptibility   Escherichia coli - MIC*    AMPICILLIN <=2 SENSITIVE Sensitive     CEFAZOLIN <=4 SENSITIVE Sensitive     CEFEPIME <=1 SENSITIVE Sensitive     CEFTAZIDIME <=1 SENSITIVE Sensitive     CEFTRIAXONE <=1 SENSITIVE Sensitive     CIPROFLOXACIN <=0.25 SENSITIVE Sensitive     GENTAMICIN <=1 SENSITIVE Sensitive     IMIPENEM <=0.25 SENSITIVE Sensitive     TRIMETH/SULFA <=20 SENSITIVE Sensitive     AMPICILLIN/SULBACTAM <=2 SENSITIVE Sensitive     PIP/TAZO <=4 SENSITIVE Sensitive     Extended ESBL NEGATIVE Sensitive     * MODERATE ESCHERICHIA COLI    Anti-infectives:  Anti-infectives    Start     Dose/Rate Route Frequency Ordered Stop   01/08/16 1000  ceFEPIme (MAXIPIME) 2 g in dextrose 5 % 50 mL IVPB     2 g 100 mL/hr over 30 Minutes Intravenous Every 12 hours 01/08/16 0751 01/15/16 2143   01/04/16 1000  piperacillin-tazobactam (ZOSYN) IVPB 3.375 g  Status:  Discontinued     3.375 g 12.5  mL/hr over 240 Minutes Intravenous Every 8 hours 01/04/16 0841 01/08/16 0751      Best Practice/Protocols:  VTE Prophylaxis: Lovenox (prophylaxtic dose) Continous Sedation  Consults: Treatment Team:  Altamese Register, MD [facility], MD   Subjective:    Overnight Issues:  none Objective:  Vital signs for last 24 hours: Temp:  [97.8 F (36.6 C)-98.2 F (36.8 C)] 97.8 F (36.6 C) (08/31 0400) Pulse Rate:  [41-127] 84 (08/31 0700) Resp:  [16-39] 22 (08/31 0700) BP: (85-126)/(50-103) 120/76 (08/31 0700) SpO2:  [96 %-100 %] 100 % (08/31 0700) FiO2 (%):  [40 %] 40 % (08/31 0312) Weight:  [86.8 kg (191 lb  5.8 oz)] 86.8 kg (191 lb 5.8 oz) (08/31 0439)  Hemodynamic parameters for last 24 hours:    Intake/Output from previous day: 08/30 0701 - 08/31 0700 In: 1185 [I.V.:240; NG/GT:945] Out: 955 [Urine:955]  Intake/Output this shift: No intake/output data recorded.  Vent settings for last 24 hours: Vent Mode: PRVC FiO2 (%):  [40 %] 40 % Set Rate:  [16 bmp-18 bmp] 18 bmp Vt Set:  [540 mL] 540 mL PEEP:  [5 cmH20] 5 cmH20 Pressure Support:  [12 cmH20] 12 cmH20 Plateau Pressure:  [15 cmH20] 15 cmH20  Physical Exam:  General: on vent Neuro: F/C HEENT/Neck: trach-clean, intact Resp: clear to auscultation bilaterally CVS: IRR GI: soft, NT  Results for orders placed or performed during the hospital encounter of 12/28/15 (from the past 24 hour(s))  Glucose, capillary     Status: Abnormal   Collection Time: 01/17/16  8:34 AM  Result Value Ref Range   Glucose-Capillary 105 (H) 65 - 99 mg/dL  Glucose, capillary     Status: Abnormal   Collection Time: 01/17/16 11:50 AM  Result Value Ref Range   Glucose-Capillary 111 (H) 65 - 99 mg/dL   Comment 1 Notify RN    Comment 2 Document in Chart   Glucose, capillary     Status: Abnormal   Collection Time: 01/17/16  8:25 PM  Result Value Ref Range   Glucose-Capillary 113 (H) 65 - 99 mg/dL  Glucose, capillary     Status: Abnormal   Collection Time: 01/18/16 12:12 AM  Result Value Ref Range   Glucose-Capillary 100 (H) 65 - 99 mg/dL  Glucose, capillary     Status: Abnormal   Collection Time: 01/18/16  4:08 AM  Result Value Ref Range   Glucose-Capillary 112 (H) 65 - 99 mg/dL  Basic metabolic panel     Status: Abnormal   Collection Time: 01/18/16  5:00 AM  Result Value Ref Range   Sodium 139 135 - 145 mmol/L   Potassium 3.6 3.5 - 5.1 mmol/L   Chloride 98 (L) 101 - 111 mmol/L   CO2 33 (H) 22 - 32 mmol/L   Glucose, Bld 152 (H) 65 - 99 mg/dL   BUN 32 (H) 6 - 20 mg/dL   Creatinine, Ser 0.70 0.61 - 1.24 mg/dL   Calcium 8.4 (L) 8.9 - 10.3 mg/dL    GFR calc non Af Amer >60 >60 mL/min   GFR calc Af Amer >60 >60 mL/min   Anion gap 8 5 - 15    Assessment & Plan: Present on Admission: . Traumatic fracture of ribs with pneumothorax . Multiple rib fractures    LOS: 20 days   Additional comments:I reviewed the patient's new clinical lab test results. . Fall Left sided rib fractures 3 through 12 with small pneumothorax- L CT out L3 and L4 transverse process fracture Left  acetabular fracture- WBAT per Dr. Marcelino Scot  Vent dependent resp failure/COPD- wean on trach collar again today, seroquel to help weaning CHF/AF- on amio and digoxin, appreciate cardiology F/U Vascular insufficiency LLE- Dr. Bridgett Larsson has consulted, no changes FEN- TF, decreased free water 8/30, F/U labs ETOH abuse Dispo- ICU, [facility] referral Critical Care Total Time*: 30 Minutes  Georganna Skeans, MD, MPH, FACS Trauma: [phone] General Surgery: [phone]  01/18/2016  *Care during the described time interval was provided by me. I have reviewed this patient's available data, including medical history, events of note, physical examination and test results as part of my evaluation.

## 2016-01-18 NOTE — Progress Notes (Signed)
Nutrition Follow-up  INTERVENTION:   Vital AF 1.2 at goal rate of 70 ml/h (1680 ml per day) to provide 2016 kcals, 126 gm protein, 1361 ml free water daily. Total free water: 1961 ml   NUTRITION DIAGNOSIS:   Inadequate oral intake related to inability to eat as evidenced by NPO status. Ongoing.   GOAL:   Patient will meet greater than or equal to 90% of their needs Met.   MONITOR:   TF tolerance, Skin, I & O's, Labs  ASSESSMENT:   Pt with hx of COPD and ETOH who was admitted s/p fall with left sided rib fxs 3-12 with small pneumothorax (CT placed 8/12), L3 and L4 transverse process fx, left acetabular fx, and SIADH.   8/24 trach, weaning but remains on ventilator support LTACH referral  Question long term plan for nutrition (?PEG)  Patient is currently intubated on ventilator support MV: 9.1 L/min Temp (24hrs), Avg:97.9 F (36.6 C), Min:97.6 F (36.4 C), Max:98.2 F (36.8 C)  Labs reviewed: CBG's: 112-211 Medications reviewed and include: folvite, lasix, MVI, thiamine, KCl in IVF 200 ml free water every 8 hours = 600 ml Cortrak: gastric antrum  Diet Order:  Diet NPO time specified  Skin:  Reviewed, no issues (skin tear (arm, hand, elbow))  Last BM:  8/31  Height:   Ht Readings from Last 1 Encounters:  12/29/15 5' 10"  (1.778 m)    Weight:   Wt Readings from Last 1 Encounters:  01/18/16 191 lb 5.8 oz (86.8 kg)    Ideal Body Weight:  75.4 kg  BMI:  Body mass index is 27.46 kg/m.  Estimated Nutritional Needs:   Kcal:  1900-2100  Protein:  110-125 grams  Fluid:  1.9-2.1 L/day  EDUCATION NEEDS:   No education needs identified at this time  [place], [place], [place] Pager [phone] After Hours Pager

## 2016-01-18 NOTE — Progress Notes (Signed)
Patient Name: Chad Rogers Date of Encounter: 01/18/2016  Hospital Problem List     Active Problems:   Cardiomyopathy [facility])   Traumatic fracture of ribs with pneumothorax   Multiple rib fractures   Respiratory failure (HCC)   Acute on chronic systolic CHF (congestive heart failure) (HCC)   NSVT (nonsustained ventricular tachycardia) (HCC)   Absent pulse in lower extremity   Atrial fibrillation, persistent [facility])    Patient Profile     80 yo male with PMH of ETOH/Tobacco abuse/NICM EF 15-20% 2014/COPD/HTN/OA and Pulmonary Fibrosis who presented to [facility] after a fall at home. Found to have multiple left-sided rib fractures, L3-4 transverse process fracture, left acetabular fracture and left pneumothorax. He was transferred to [facility] under trauma services. He has had vent dependent resp failure and is now status post trach.  Has had atrial fib with RVR managed with IV amiodarone initially.   Subjective   Opens eyes to verbal stimuli.   Nods heads to questions.   Inpatient Medications    . amiodarone  400 mg Oral BID  . bethanechol  10 mg Per Tube TID  . chlorhexidine  15 mL Mouth/Throat BID  . digoxin  0.125 mg Oral Daily  . enoxaparin (LOVENOX) injection  40 mg Subcutaneous Q24H  . famotidine  20 mg Per Tube Daily  . folic acid  1 mg Oral Daily  . free water  200 mL Per Tube Q8H  . furosemide  40 mg Intravenous BID  . insulin aspart  0-15 Units Subcutaneous Q4H  . mouth rinse  15 mL Mouth Rinse QID  . metoprolol tartrate  12.5 mg Oral BID  . midazolam  2 mg Intravenous Once  . midazolam  2 mg Intravenous Once  . multivitamin with minerals  1 tablet Oral Daily  . QUEtiapine  50 mg Per Tube BID  . sertraline  50 mg Oral Daily  . sodium chloride flush  10-40 mL Intracatheter Q12H  . thiamine  100 mg Oral Daily   Or  . thiamine  100 mg Intravenous Daily    Vital Signs    Vitals:   01/18/16 0825 01/18/16 0946 01/18/16 1000 01/18/16 1135  BP:    121/79   Pulse:   78 89  Resp:   (!) 27 (!) 30  Temp:      TempSrc:      SpO2: 100% 98% 100% 100%  Weight:      Height:        Intake/Output Summary (Last 24 hours) at 01/18/16 1231 Last data filed at 01/18/16 1100  Gross per 24 hour  Intake             1450 ml  Output             1050 ml  Net              400 ml   Filed Weights   01/16/16 0500 01/17/16 0225 01/18/16 0439  Weight: 188 lb 15 oz (85.7 kg) 195 lb 12.3 oz (88.8 kg) 191 lb 5.8 oz (86.8 kg)    Physical Exam    GEN: Well nourished, well developed, in no acute distress.  Neck: Supple, no JVD, carotid bruits, or masses. Trach Cardiac: Irregular, no murmurs, rubs, or gallops. No clubbing, cyanosis, 2+ edema to bilateral lower extermities.  Radials/DP/PT 2+ and equal bilaterally.  Respiratory:  Respirations  regular and unlabored, decreased breath sounds.  GI: Soft, nontender, nondistended, BS hypoactive. Neuro:  lethargic, strength intact   Labs    CBC  Recent Labs  01/17/16 0512  WBC 8.8  HGB 9.7*  HCT 31.8*  MCV 107.1*  PLT [id number]   Basic Metabolic Panel  Recent Labs  01/17/16 0512 01/18/16 0500  NA 141 139  K 3.6 3.6  CL 99* 98*  CO2 35* 33*  GLUCOSE 107* 152*  BUN 33* 32*  CREATININE 0.62 0.70  CALCIUM 8.6* 8.4*   Liver Function Tests No results for input(s): AST, ALT, ALKPHOS, BILITOT, PROT, ALBUMIN in the last 72 hours. No results for input(s): LIPASE, AMYLASE in the last 72 hours. Cardiac Enzymes No results for input(s): CKTOTAL, CKMB, CKMBINDEX, TROPONINI in the last 72 hours. BNP Invalid input(s): POCBNP D-Dimer No results for input(s): DDIMER in the last 72 hours. Hemoglobin A1C No results for input(s): HGBA1C in the last 72 hours. Fasting Lipid Panel No results for input(s): CHOL, HDL, LDLCALC, TRIG, CHOLHDL, LDLDIRECT in the last 72 hours. Thyroid Function Tests No results for input(s): TSH, T4TOTAL, T3FREE, THYROIDAB in the last 72 hours.  Invalid input(s):  FREET3  Telemetry    Atrial fib rate controlled  ECG    NA  Radiology   Dg Chest Port 1 View  Result Date: 01/16/2016 CLINICAL DATA:  History of left pneumothorax, followup EXAM: PORTABLE CHEST 1 VIEW COMPARISON:  Portable chest x-ray of 01/15/2016 and CT chest of 12/28/2015 FINDINGS: There has been worsening of airspace disease throughout the lungs right greater than left which could be due to edema or pneumonia. Small effusions cannot be excluded. A left chest tube remains and no left pneumothorax is seen although there again multiple left rib fractures are identified. Feeding tube and tracheostomy remain. Right PICC line is unchanged in position with the tip overlying the mid SVC. IMPRESSION: 1. Worsening of diffuse airspace disease. Consider edema versus pneumonia. Cannot exclude small effusions. 2. Left chest tube remains.  No definite left pneumothorax. 3. Multiple left rib fractures again are noted. Electronically Signed   By: Ivar Drape M.D.   On: 01/16/2016 10:18    Assessment & Plan    1. ATRIAL FIB:  Rate remains controlled on oral amiodarone, started on 8/26.  Continue current therapy.   [name] has a CHA2DS2 - VASc score of 5 with a risk of stroke of 6.7%.   Needs to be on systemic anticoagulation when we are sure that there are no further invasive procedures planned and no active bleeding issues if he is thought to be at low fall risk in the future.  (This would depend on his condition after he has recovered from acute events and has had rehab.)  2. ACUTE ON CHRONIC SYSTOLIC HF:  Lower extremity edema has increased. Would continue current IV diuresis, but may need to increase.  UOP 1.1L yesterday. Cr stable, BUN remains slightly elevated but improved. Weight is variable.   -- Free water has been decreased as of 8/30 with tube feeds. Follow daily BMETs  Signed, Reino Bellis, NP  01/18/2016, 12:31 PM   History and all data above reviewed.  Patient examined.  I  agree with the findings as above.   The patient is somnolent.  The patient exam reveals [id number]  ,  Lungs: Decreased breath sounds  ,  Abd: Positive bowel sounds, no rebound no guarding, Ext No edema  .  All available labs, radiology testing, previous records reviewed. Agree with documented assessment and plan. Acute on chronic HF:  Continue with IV  diuresis.  Extremity edema seems to be OK.  Follow strict I/Os.  Atrial fib:  Rate OK.  Continue current dose of amiodarone.    Raj Kodee Ravert  3:58 PM  01/18/2016

## 2016-01-19 ENCOUNTER — Other Ambulatory Visit (HOSPITAL_COMMUNITY): Payer: Self-pay

## 2016-01-19 ENCOUNTER — Inpatient Hospital Stay
Admission: AD | Admit: 2016-01-19 | Discharge: 2016-02-15 | Disposition: A | Discharge status: Rehabilitation Facility | Payer: Self-pay | Source: Ambulatory Visit | Attending: Internal Medicine | Admitting: Internal Medicine

## 2016-01-19 DIAGNOSIS — S32402D Unspecified fracture of left acetabulum, subsequent encounter for fracture with routine healing: Secondary | ICD-10-CM | POA: Diagnosis not present

## 2016-01-19 DIAGNOSIS — Z9911 Dependence on respirator [ventilator] status: Secondary | ICD-10-CM | POA: Diagnosis not present

## 2016-01-19 DIAGNOSIS — G8929 Other chronic pain: Secondary | ICD-10-CM | POA: Diagnosis not present

## 2016-01-19 DIAGNOSIS — I509 Heart failure, unspecified: Secondary | ICD-10-CM | POA: Diagnosis not present

## 2016-01-19 DIAGNOSIS — R001 Bradycardia, unspecified: Secondary | ICD-10-CM | POA: Diagnosis not present

## 2016-01-19 DIAGNOSIS — S270XXA Traumatic pneumothorax, initial encounter: Secondary | ICD-10-CM | POA: Diagnosis not present

## 2016-01-19 DIAGNOSIS — G8918 Other acute postprocedural pain: Secondary | ICD-10-CM | POA: Diagnosis not present

## 2016-01-19 DIAGNOSIS — R0602 Shortness of breath: Secondary | ICD-10-CM | POA: Diagnosis not present

## 2016-01-19 DIAGNOSIS — R140 Abdominal distension (gaseous): Secondary | ICD-10-CM

## 2016-01-19 DIAGNOSIS — I5022 Chronic systolic (congestive) heart failure: Secondary | ICD-10-CM | POA: Diagnosis not present

## 2016-01-19 DIAGNOSIS — Z4659 Encounter for fitting and adjustment of other gastrointestinal appliance and device: Secondary | ICD-10-CM

## 2016-01-19 DIAGNOSIS — J9600 Acute respiratory failure, unspecified whether with hypoxia or hypercapnia: Secondary | ICD-10-CM | POA: Diagnosis not present

## 2016-01-19 DIAGNOSIS — Z930 Tracheostomy status: Secondary | ICD-10-CM | POA: Diagnosis not present

## 2016-01-19 DIAGNOSIS — I4891 Unspecified atrial fibrillation: Secondary | ICD-10-CM | POA: Diagnosis not present

## 2016-01-19 DIAGNOSIS — R1310 Dysphagia, unspecified: Secondary | ICD-10-CM | POA: Diagnosis not present

## 2016-01-19 DIAGNOSIS — J942 Hemothorax: Secondary | ICD-10-CM

## 2016-01-19 DIAGNOSIS — I255 Ischemic cardiomyopathy: Secondary | ICD-10-CM | POA: Diagnosis not present

## 2016-01-19 DIAGNOSIS — I959 Hypotension, unspecified: Secondary | ICD-10-CM | POA: Diagnosis not present

## 2016-01-19 DIAGNOSIS — I481 Persistent atrial fibrillation: Secondary | ICD-10-CM | POA: Diagnosis not present

## 2016-01-19 DIAGNOSIS — E46 Unspecified protein-calorie malnutrition: Secondary | ICD-10-CM | POA: Diagnosis not present

## 2016-01-19 DIAGNOSIS — S2242XD Multiple fractures of ribs, left side, subsequent encounter for fracture with routine healing: Secondary | ICD-10-CM | POA: Diagnosis not present

## 2016-01-19 DIAGNOSIS — M1990 Unspecified osteoarthritis, unspecified site: Secondary | ICD-10-CM | POA: Diagnosis not present

## 2016-01-19 DIAGNOSIS — W010XXD Fall on same level from slipping, tripping and stumbling without subsequent striking against object, subsequent encounter: Secondary | ICD-10-CM | POA: Diagnosis present

## 2016-01-19 DIAGNOSIS — E871 Hypo-osmolality and hyponatremia: Secondary | ICD-10-CM | POA: Diagnosis not present

## 2016-01-19 DIAGNOSIS — Z23 Encounter for immunization: Secondary | ICD-10-CM | POA: Diagnosis not present

## 2016-01-19 DIAGNOSIS — J984 Other disorders of lung: Secondary | ICD-10-CM | POA: Diagnosis not present

## 2016-01-19 DIAGNOSIS — Z720 Tobacco use: Secondary | ICD-10-CM | POA: Diagnosis not present

## 2016-01-19 DIAGNOSIS — L409 Psoriasis, unspecified: Secondary | ICD-10-CM | POA: Diagnosis not present

## 2016-01-19 DIAGNOSIS — M7989 Other specified soft tissue disorders: Secondary | ICD-10-CM | POA: Diagnosis not present

## 2016-01-19 DIAGNOSIS — G894 Chronic pain syndrome: Secondary | ICD-10-CM | POA: Diagnosis not present

## 2016-01-19 DIAGNOSIS — I110 Hypertensive heart disease with heart failure: Secondary | ICD-10-CM | POA: Diagnosis not present

## 2016-01-19 DIAGNOSIS — D638 Anemia in other chronic diseases classified elsewhere: Secondary | ICD-10-CM | POA: Diagnosis not present

## 2016-01-19 DIAGNOSIS — J8410 Pulmonary fibrosis, unspecified: Secondary | ICD-10-CM | POA: Diagnosis not present

## 2016-01-19 DIAGNOSIS — I10 Essential (primary) hypertension: Secondary | ICD-10-CM | POA: Diagnosis not present

## 2016-01-19 DIAGNOSIS — J9690 Respiratory failure, unspecified, unspecified whether with hypoxia or hypercapnia: Secondary | ICD-10-CM | POA: Diagnosis not present

## 2016-01-19 DIAGNOSIS — F17210 Nicotine dependence, cigarettes, uncomplicated: Secondary | ICD-10-CM | POA: Diagnosis present

## 2016-01-19 DIAGNOSIS — I482 Chronic atrial fibrillation: Secondary | ICD-10-CM | POA: Diagnosis not present

## 2016-01-19 DIAGNOSIS — R5381 Other malaise: Secondary | ICD-10-CM | POA: Diagnosis not present

## 2016-01-19 DIAGNOSIS — K760 Fatty (change of) liver, not elsewhere classified: Secondary | ICD-10-CM | POA: Diagnosis not present

## 2016-01-19 DIAGNOSIS — S2242XA Multiple fractures of ribs, left side, initial encounter for closed fracture: Secondary | ICD-10-CM | POA: Diagnosis not present

## 2016-01-19 DIAGNOSIS — J449 Chronic obstructive pulmonary disease, unspecified: Secondary | ICD-10-CM | POA: Diagnosis not present

## 2016-01-19 DIAGNOSIS — Z430 Encounter for attention to tracheostomy: Secondary | ICD-10-CM | POA: Diagnosis not present

## 2016-01-19 DIAGNOSIS — G8928 Other chronic postprocedural pain: Secondary | ICD-10-CM | POA: Diagnosis not present

## 2016-01-19 DIAGNOSIS — R739 Hyperglycemia, unspecified: Secondary | ICD-10-CM | POA: Diagnosis not present

## 2016-01-19 DIAGNOSIS — I5023 Acute on chronic systolic (congestive) heart failure: Secondary | ICD-10-CM | POA: Diagnosis not present

## 2016-01-19 DIAGNOSIS — Z4682 Encounter for fitting and adjustment of non-vascular catheter: Secondary | ICD-10-CM | POA: Diagnosis not present

## 2016-01-19 DIAGNOSIS — J90 Pleural effusion, not elsewhere classified: Secondary | ICD-10-CM | POA: Diagnosis not present

## 2016-01-19 DIAGNOSIS — I472 Ventricular tachycardia: Secondary | ICD-10-CM | POA: Diagnosis not present

## 2016-01-19 DIAGNOSIS — Z79899 Other long term (current) drug therapy: Secondary | ICD-10-CM | POA: Diagnosis not present

## 2016-01-19 DIAGNOSIS — G4700 Insomnia, unspecified: Secondary | ICD-10-CM | POA: Diagnosis not present

## 2016-01-19 DIAGNOSIS — I5033 Acute on chronic diastolic (congestive) heart failure: Secondary | ICD-10-CM | POA: Diagnosis not present

## 2016-01-19 DIAGNOSIS — I429 Cardiomyopathy, unspecified: Secondary | ICD-10-CM | POA: Diagnosis not present

## 2016-01-19 DIAGNOSIS — E538 Deficiency of other specified B group vitamins: Secondary | ICD-10-CM | POA: Diagnosis not present

## 2016-01-19 HISTORY — DX: Cerebrospinal fluid leak: G96.0

## 2016-01-19 LAB — BASIC METABOLIC PANEL
Anion gap: 8 (ref 5–15)
BUN: 30 mg/dL — AB (ref 6–20)
CALCIUM: 8.6 mg/dL — AB (ref 8.9–10.3)
CO2: 36 mmol/L — ABNORMAL HIGH (ref 22–32)
CREATININE: 0.66 mg/dL (ref 0.61–1.24)
Chloride: 97 mmol/L — ABNORMAL LOW (ref 101–111)
GFR calc Af Amer: >60 mL/min (ref >60)
Glucose, Bld: 118 mg/dL — ABNORMAL HIGH (ref 65–99)
POTASSIUM: 3.2 mmol/L — AB (ref 3.5–5.1)
SODIUM: 141 mmol/L (ref 135–145)

## 2016-01-19 LAB — GLUCOSE, CAPILLARY
GLUCOSE-CAPILLARY: 138 mg/dL — AB (ref 65–99)
GLUCOSE-CAPILLARY: 154 mg/dL — AB (ref 65–99)
GLUCOSE-CAPILLARY: 128 mg/dL — AB (ref 65–99)
Glucose-Capillary: 121 mg/dL — ABNORMAL HIGH (ref 65–99)

## 2016-01-19 MED ORDER — INSULIN ASPART 100 UNIT/ML ~~LOC~~ SOLN: 0.0000 [IU] | SUBCUTANEOUS | 11 refills | Status: DC

## 2016-01-19 MED ORDER — VITAL AF 1.2 CAL PO LIQD: 1000.0000 mL | ORAL | Status: AC

## 2016-01-19 MED ORDER — FREE WATER: 200.0000 mL | Freq: Three times a day (TID) | Status: DC

## 2016-01-19 MED ORDER — FUROSEMIDE 10 MG/ML IJ SOLN: 40.0000 mg | Freq: Two times a day (BID) | INTRAMUSCULAR | 0 refills | Status: DC

## 2016-01-19 MED ORDER — POTASSIUM CL IN DEXTROSE 5% 20 MEQ/L IV SOLN: 20.0000 meq | INTRAVENOUS | Status: DC

## 2016-01-19 MED ORDER — POTASSIUM CHLORIDE 20 MEQ/15ML (10%) PO SOLN: 40.0000 meq | Freq: Two times a day (BID) | ORAL | 0 refills | Status: DC

## 2016-01-19 MED ORDER — HYDROCODONE-ACETAMINOPHEN 7.5-325 MG/15ML PO SOLN: 15.0000 mL | ORAL | 0 refills | Status: DC | PRN

## 2016-01-19 MED ORDER — FAMOTIDINE 40 MG/5ML PO SUSR: 20.0000 mg | Freq: Every day | ORAL | 0 refills | Status: DC

## 2016-01-19 MED ORDER — ADULT MULTIVITAMIN W/MINERALS CH: 1.0000 | ORAL_TABLET | Freq: Every day | ORAL | Status: AC

## 2016-01-19 MED ORDER — SERTRALINE HCL 50 MG PO TABS: 50.0000 mg | ORAL_TABLET | Freq: Every day | ORAL | Status: DC

## 2016-01-19 MED ORDER — BETHANECHOL CHLORIDE 10 MG PO TABS: 10.0000 mg | ORAL_TABLET | Freq: Three times a day (TID) | ORAL | Status: DC

## 2016-01-19 MED ORDER — ALPRAZOLAM 0.5 MG PO TABS: 0.5000 mg | ORAL_TABLET | Freq: Every evening | ORAL | 0 refills | Status: DC | PRN

## 2016-01-19 MED ORDER — CHLORHEXIDINE GLUCONATE 0.12 % MT SOLN: 15.0000 mL | Freq: Two times a day (BID) | OROMUCOSAL | 0 refills | Status: DC

## 2016-01-19 MED ORDER — METOPROLOL TARTRATE 25 MG PO TABS: 12.5000 mg | ORAL_TABLET | Freq: Two times a day (BID) | ORAL | Status: DC

## 2016-01-19 MED ORDER — ORAL CARE MOUTH RINSE: 15.0000 mL | Freq: Four times a day (QID) | OROMUCOSAL | 0 refills | Status: DC

## 2016-01-19 MED ORDER — DIGOXIN 125 MCG PO TABS: 0.1250 mg | ORAL_TABLET | Freq: Every day | ORAL | Status: DC

## 2016-01-19 MED ORDER — FOLIC ACID 1 MG PO TABS: 1.0000 mg | ORAL_TABLET | Freq: Every day | ORAL | Status: AC

## 2016-01-19 MED ORDER — THIAMINE HCL 100 MG PO TABS: 100.0000 mg | ORAL_TABLET | Freq: Every day | ORAL | Status: AC

## 2016-01-19 MED ORDER — BISACODYL 10 MG RE SUPP: 10.0000 mg | Freq: Every day | RECTAL | 0 refills | Status: DC | PRN

## 2016-01-19 MED ORDER — QUETIAPINE FUMARATE 50 MG PO TABS: 50.0000 mg | ORAL_TABLET | Freq: Two times a day (BID) | ORAL | Status: DC

## 2016-01-19 MED ORDER — AMIODARONE HCL 400 MG PO TABS: 400.0000 mg | ORAL_TABLET | Freq: Two times a day (BID) | ORAL | Status: DC

## 2016-01-19 MED ORDER — ACETAMINOPHEN 325 MG PO TABS: 650.0000 mg | ORAL_TABLET | ORAL | Status: DC | PRN

## 2016-01-19 MED ORDER — ONDANSETRON HCL 4 MG PO TABS: 4.0000 mg | ORAL_TABLET | Freq: Four times a day (QID) | ORAL | 0 refills | Status: DC | PRN

## 2016-01-19 MED ORDER — LEVALBUTEROL HCL 0.63 MG/3ML IN NEBU: 0.6300 mg | INHALATION_SOLUTION | Freq: Four times a day (QID) | RESPIRATORY_TRACT | 12 refills | Status: AC | PRN

## 2016-01-19 MED ORDER — ENOXAPARIN SODIUM 40 MG/0.4ML ~~LOC~~ SOLN: 40.0000 mg | SUBCUTANEOUS | Status: DC

## 2016-01-19 MED ORDER — HYDROMORPHONE HCL 1 MG/ML IJ SOLN: 0.5000 mg | INTRAMUSCULAR | 0 refills | Status: DC | PRN

## 2016-01-19 MED ORDER — IPRATROPIUM BROMIDE 0.02 % IN SOLN: 0.5000 mg | RESPIRATORY_TRACT | 12 refills | Status: DC | PRN

## 2016-01-19 MED ORDER — SODIUM CHLORIDE 0.9% FLUSH: 10.0000 mL | INTRAVENOUS | Status: DC | PRN

## 2016-01-19 MED ORDER — POTASSIUM CHLORIDE 20 MEQ/15ML (10%) PO SOLN
40.0000 meq | Freq: Two times a day (BID) | ORAL | Status: DC
  Administered 2016-01-19: 40 meq via ORAL
  Filled 2016-01-19: qty 30

## 2016-01-19 NOTE — Discharge Summary (Signed)
  [facility] Discharge Summary   Patient ID: Chad Rogers MRN: [id number] DOB/AGE: 01-26-1936 80 y.o.  Admit date: 12/28/2015 Discharge date: 01/19/2016  Admitting Diagnosis: Fall Left sided rib fractures 3 through 12 with small pneumothorax L3 and L4 transverse process fracture Left acetabular fracture  COPD CHF ETOH abuse  Discharge Diagnosis Patient Active Problem List   Diagnosis Date Noted  . Atrial fibrillation, persistent ([place]) 01/15/2016  . Absent pulse in lower extremity   . Acute on chronic systolic CHF (congestive heart failure) ([place]) 01/01/2016  . NSVT (nonsustained ventricular tachycardia) ([place]) 01/01/2016  . Respiratory failure ([place])   . Traumatic fracture of ribs with pneumothorax 12/29/2015  . Multiple rib fractures 12/29/2015  . Cardiomyopathy ([place])   . Hypertension   . Pulmonary fibrosis ([place])   . Osteoarthritis   . Tobacco abuse     Consultants Adele Barthel Md, vascular surgery Ainsley Spinner Pa, ortho Dorris Carnes, cardiology Luz Brazen, critical care  Imaging: Dg Abd Portable 1v  Result Date: 01/17/2016 CLINICAL DATA:  Evaluate feeding tube placement. EXAM: PORTABLE ABDOMEN - 1 VIEW COMPARISON:  None. FINDINGS: Feeding tube with the tip projecting over the gastric antrum. There is no bowel dilatation to suggest obstruction. There is no evidence of pneumoperitoneum, portal venous gas or pneumatosis. There are no pathologic calcifications along the expected course of the ureters. The osseous structures are unremarkable. IMPRESSION: Feeding tube with the tip projecting over the gastric antrum. Electronically Signed   By: Kathreen Devoid   On: 01/17/2016 18:12   CT chest with contrast 12/28/15: Acute fractures of the left third through twelfth ribs with small left pneumothorax and left subcutaneous emphysema. Acute nondisplaced fractures of the anterior left acetabulum and right L3 and L4 transverse processes. No evidence of solid or hollow  visceral injury. Cardiomegaly and coronary artery disease. Hepatic steatosis. Abdominal aortic atherosclerosis.    Procedures Dr. Hulen Skains (01/10/16) - [place] Hospital Course:  Chad Rogers is a 80yo male with a history of tobacco abuse, COPD and CHF who presented to [facility] 12/28/15 after suffering a ground level fall.  Workup showed multiple left-sided rib fractures 3-12 with small pneumothorax, L3-4 transverse process fractures, and left acetabular fracture.  Patient was admitted for observation.  He was made WBAT BLE. An epidural catheter was placed for rib fracture pain but he became hypotensive after this procedure, therefore it was disconnected. While admitted his pneumothorax worsened so a chest tube was inserted and he was intubated. Cardiology was consulted due to persistent irregular rhythm with wide complex tachycardia; he was found to have atrial fibrillation with RVR and started on amiodarone. On 01/06/16 unable to find left foot pulses, a LLE arterial duplex performed and demonstrated triphasic flow in CFA, PFA, SFA without thrombus and reflected significant atherosclerotic disease - no further intervention recommended due to chronicity of problem and not being a good surgical candidate. Ventilator weaning was attempted multiple times but was unsuccessful. Due to prolonged intubation a percutaneous tracheostomy was performed 01/10/16. He did not wean well so trach remained in place. After 22 days the patient was discharged to [facility].   Physical Exam: General: awake on vent Neuro: F/C HEENT/Neck: trach-clean, intact Resp: clear to auscultation bilaterally CVS: IRR GI: soft, NT, ND    Signed: Jerrye Beavers, [facility] 01/19/2016, 2:37 PM Pager: [phone] Consults: [phone] Mon-Fri 7:00 am-4:30 pm Sat-Sun 7:00 am-11:30 am

## 2016-01-19 NOTE — Progress Notes (Signed)
Patient ID: Chad Rogers, male   DOB: [birth year], 80 y.o.   MRN: [id number] Follow up - Trauma Critical Care  Patient Details:    Chad Rogers is an 80 y.o. male.  Lines/tubes : PICC Triple Lumen [id number] PICC Right Basilic 38 cm 0 cm (Active)  Indication for Insertion or Continuance of Line Prolonged intravenous therapies 01/18/2016  8:00 PM  Exposed Catheter (cm) 0 cm 12/30/2015  3:21 PM  Site Assessment Clean;Dry;Intact 01/18/2016  8:00 PM  Lumen #1 Status Infusing;Flushed;Blood return noted 01/18/2016  8:00 PM  Lumen #2 Status Flushed;Blood return noted;Saline locked 01/18/2016  8:00 PM  Lumen #3 Status Flushed;Blood return noted;Saline locked 01/18/2016  8:00 PM  Dressing Type Transparent 01/18/2016  8:00 PM  Dressing Status Clean;Dry;Intact;Antimicrobial disc in place 01/18/2016  8:00 PM  Line Care Connections checked and tightened 01/18/2016  8:00 PM  Dressing Intervention New dressing;Dressing changed 01/13/2016  6:00 AM  Dressing Change Due 01/20/16 01/18/2016  8:00 PM     External Urinary Catheter (Active)  Collection Container Standard drainage bag 01/18/2016  8:00 PM  Securement Method Other (Comment) 01/16/2016 12:00 PM  Output (mL) 500 mL 01/19/2016 12:00 AM    Microbiology/Sepsis markers: Results for orders placed or performed during the hospital encounter of 12/28/15  MRSA PCR Screening     Status: None   Collection Time: 12/29/15  5:06 AM  Result Value Ref Range Status   MRSA by PCR NEGATIVE NEGATIVE Final    Comment:        The GeneXpert MRSA Assay (FDA approved for NASAL specimens only), is one component of a comprehensive MRSA colonization surveillance program. It is not intended to diagnose MRSA infection nor to guide or monitor treatment for MRSA infections.   Culture, respiratory (NON-Expectorated)     Status: None   Collection Time: 01/03/16 11:55 AM  Result Value Ref Range Status   Specimen Description TRACHEAL ASPIRATE  Final   Special Requests  Normal  Final   Gram Stain   Final    ABUNDANT WBC PRESENT, PREDOMINANTLY PMN NO SQUAMOUS EPITHELIAL CELLS SEEN ABUNDANT GRAM NEGATIVE COCCOBACILLI FEW GRAM POSITIVE RODS FEW GRAM POSITIVE COCCI IN PAIRS RARE GRAM NEGATIVE RODS    Culture   Final    ABUNDANT HAEMOPHILUS INFLUENZAE BETA LACTAMASE NEGATIVE MODERATE ESCHERICHIA COLI    Report Status 01/06/2016 FINAL  Final   Organism ID, Bacteria ESCHERICHIA COLI  Final      Susceptibility   Escherichia coli - MIC*    AMPICILLIN <=2 SENSITIVE Sensitive     CEFAZOLIN <=4 SENSITIVE Sensitive     CEFEPIME <=1 SENSITIVE Sensitive     CEFTAZIDIME <=1 SENSITIVE Sensitive     CEFTRIAXONE <=1 SENSITIVE Sensitive     CIPROFLOXACIN <=0.25 SENSITIVE Sensitive     GENTAMICIN <=1 SENSITIVE Sensitive     IMIPENEM <=0.25 SENSITIVE Sensitive     TRIMETH/SULFA <=20 SENSITIVE Sensitive     AMPICILLIN/SULBACTAM <=2 SENSITIVE Sensitive     PIP/TAZO <=4 SENSITIVE Sensitive     Extended ESBL NEGATIVE Sensitive     * MODERATE ESCHERICHIA COLI    Anti-infectives:  Anti-infectives    Start     Dose/Rate Route Frequency Ordered Stop   01/08/16 1000  ceFEPIme (MAXIPIME) 2 g in dextrose 5 % 50 mL IVPB     2 g 100 mL/hr over 30 Minutes Intravenous Every 12 hours 01/08/16 0751 01/15/16 2143   01/04/16 1000  piperacillin-tazobactam (ZOSYN) IVPB 3.375 g  Status:  Discontinued  3.375 g 12.5 mL/hr over 240 Minutes Intravenous Every 8 hours 01/04/16 0841 01/08/16 0751      Best Practice/Protocols:  VTE Prophylaxis: Lovenox (prophylaxtic dose) Intermittent Sedation  Consults: Treatment Team:  Altamese Cache, MD [facility], MD   Subjective:    Overnight Issues:   Objective:  Vital signs for last 24 hours: Temp:  [97.5 F (36.4 C)-97.8 F (36.6 C)] 97.8 F (36.6 C) (09/01 0400) Pulse Rate:  [50-105] 65 (09/01 0700) Resp:  [17-30] 18 (09/01 0700) BP: (94-140)/(52-95) 108/63 (09/01 0700) SpO2:  [98 %-100 %] 100 % (09/01  0700) FiO2 (%):  [40 %] 40 % (09/01 0449) Weight:  [86.5 kg (190 lb 11.2 oz)] 86.5 kg (190 lb 11.2 oz) (09/01 0330)  Hemodynamic parameters for last 24 hours:    Intake/Output from previous day: 08/31 0701 - 09/01 0700 In: 2180 [I.V.:270; NG/GT:1910] Out: 1350 [Urine:1350]  Intake/Output this shift: No intake/output data recorded.  Vent settings for last 24 hours: Vent Mode: PRVC FiO2 (%):  [40 %] 40 % Set Rate:  [18 bmp] 18 bmp Vt Set:  [540 mL] 540 mL PEEP:  [5 cmH20] 5 cmH20 Pressure Support:  [12 cmH20] 12 cmH20 Plateau Pressure:  [14 cmH20-20 cmH20] 18 cmH20  Physical Exam:  General: awake on vent Neuro: F/C HEENT/Neck: trach-clean, intact Resp: clear to auscultation bilaterally CVS: IRR GI: soft, NT, ND  Results for orders placed or performed during the hospital encounter of 12/28/15 (from the past 24 hour(s))  Glucose, capillary     Status: Abnormal   Collection Time: 01/18/16  9:02 AM  Result Value Ref Range   Glucose-Capillary 211 (H) 65 - 99 mg/dL  Glucose, capillary     Status: Abnormal   Collection Time: 01/18/16 12:40 PM  Result Value Ref Range   Glucose-Capillary 131 (H) 65 - 99 mg/dL   Comment 1 Notify RN    Comment 2 Document in Chart   Glucose, capillary     Status: Abnormal   Collection Time: 01/18/16  4:24 PM  Result Value Ref Range   Glucose-Capillary 142 (H) 65 - 99 mg/dL  Glucose, capillary     Status: Abnormal   Collection Time: 01/18/16  8:14 PM  Result Value Ref Range   Glucose-Capillary 148 (H) 65 - 99 mg/dL  Glucose, capillary     Status: Abnormal   Collection Time: 01/18/16 11:56 PM  Result Value Ref Range   Glucose-Capillary 133 (H) 65 - 99 mg/dL  Glucose, capillary     Status: Abnormal   Collection Time: 01/19/16  3:59 AM  Result Value Ref Range   Glucose-Capillary 121 (H) 65 - 99 mg/dL  Basic metabolic panel     Status: Abnormal   Collection Time: 01/19/16  4:39 AM  Result Value Ref Range   Sodium 141 135 - 145 mmol/L    Potassium 3.2 (L) 3.5 - 5.1 mmol/L   Chloride 97 (L) 101 - 111 mmol/L   CO2 36 (H) 22 - 32 mmol/L   Glucose, Bld 118 (H) 65 - 99 mg/dL   BUN 30 (H) 6 - 20 mg/dL   Creatinine, Ser 0.66 0.61 - 1.24 mg/dL   Calcium 8.6 (L) 8.9 - 10.3 mg/dL   GFR calc non Af Amer >60 >60 mL/min   GFR calc Af Amer >60 >60 mL/min   Anion gap 8 5 - 15  Glucose, capillary     Status: Abnormal   Collection Time: 01/19/16  7:38 AM  Result Value Ref Range  Glucose-Capillary 138 (H) 65 - 99 mg/dL    Assessment & Plan: Present on Admission: . Traumatic fracture of ribs with pneumothorax . Multiple rib fractures    LOS: 21 days   Additional comments:I reviewed the patient's new clinical lab test results. . Fall Left sided rib fractures 3 through 12 with small pneumothorax- L CT out L3 and L4 transverse process fracture Left acetabular fracture- WBAT per Dr. Marcelino Scot  Vent dependent resp failure/COPD- did not wean as well yesterday, continue trials CHF/AF- on amio and digoxin, appreciate cardiology F/U Vascular insufficiency LLE- Dr. Bridgett Larsson has consulted, no changes FEN- TF, replete hypokalemia ETOH abuse Dispo- ICU, LTACH referral Critical Care Total Time*: 30 Minutes  Georganna Skeans, MD, MPH, FACS Trauma: [phone] General Surgery: [phone]  01/19/2016  *Care during the described time interval was provided by me. I have reviewed this patient's available data, including medical history, events of note, physical examination and test results as part of my evaluation.

## 2016-01-20 LAB — CBC WITH DIFFERENTIAL/PLATELET
BASOS ABS: 0 10*3/uL (ref 0.0–0.1)
BASOS PCT: 0 %
EOS ABS: 0 10*3/uL (ref 0.0–0.7)
EOS PCT: 0 %
HCT: 29.2 % — ABNORMAL LOW (ref 39.0–52.0)
Hemoglobin: 8.7 g/dL — ABNORMAL LOW (ref 13.0–17.0)
Lymphocytes Relative: 21 %
Lymphs Abs: 1.5 10*3/uL (ref 0.7–4.0)
MCH: 31.9 pg (ref 26.0–34.0)
MCHC: 29.8 g/dL — ABNORMAL LOW (ref 30.0–36.0)
MCV: 107 fL — ABNORMAL HIGH (ref 78.0–100.0)
MONO ABS: 0.5 10*3/uL (ref 0.1–1.0)
Monocytes Relative: 7 %
Neutro Abs: 5 10*3/uL (ref 1.7–7.7)
Neutrophils Relative %: 72 %
PLATELETS: 181 10*3/uL (ref 150–400)
RBC: 2.73 MIL/uL — ABNORMAL LOW (ref 4.22–5.81)
RDW: 16.8 % — AB (ref 11.5–15.5)
WBC: 7 10*3/uL (ref 4.0–10.5)

## 2016-01-20 LAB — COMPREHENSIVE METABOLIC PANEL
ALBUMIN: 2.2 g/dL — AB (ref 3.5–5.0)
ALK PHOS: 93 U/L (ref 38–126)
ALT: 40 U/L (ref 17–63)
AST: 24 U/L (ref 15–41)
Anion gap: 7 (ref 5–15)
BILIRUBIN TOTAL: 1.2 mg/dL (ref 0.3–1.2)
BUN: 23 mg/dL — AB (ref 6–20)
CALCIUM: 8.3 mg/dL — AB (ref 8.9–10.3)
CO2: 33 mmol/L — ABNORMAL HIGH (ref 22–32)
Chloride: 99 mmol/L — ABNORMAL LOW (ref 101–111)
Creatinine, Ser: 0.62 mg/dL (ref 0.61–1.24)
GFR calc Af Amer: >60 mL/min (ref >60)
GLUCOSE: 228 mg/dL — AB (ref 65–99)
Potassium: 3.3 mmol/L — ABNORMAL LOW (ref 3.5–5.1)
Sodium: 139 mmol/L (ref 135–145)
TOTAL PROTEIN: 5.1 g/dL — AB (ref 6.5–8.1)

## 2016-01-21 ENCOUNTER — Other Ambulatory Visit (HOSPITAL_COMMUNITY): Payer: Self-pay

## 2016-01-21 DIAGNOSIS — J9690 Respiratory failure, unspecified, unspecified whether with hypoxia or hypercapnia: Secondary | ICD-10-CM | POA: Diagnosis not present

## 2016-01-21 DIAGNOSIS — R140 Abdominal distension (gaseous): Secondary | ICD-10-CM | POA: Diagnosis not present

## 2016-01-21 LAB — URINALYSIS, ROUTINE W REFLEX MICROSCOPIC
BILIRUBIN URINE: NEGATIVE
Glucose, UA: NEGATIVE mg/dL
HGB URINE DIPSTICK: NEGATIVE
Ketones, ur: NEGATIVE mg/dL
Leukocytes, UA: NEGATIVE
Nitrite: NEGATIVE
PH: 8 (ref 5.0–8.0)
Protein, ur: NEGATIVE mg/dL
SPECIFIC GRAVITY, URINE: 1.014 (ref 1.005–1.030)

## 2016-01-21 LAB — DIGOXIN LEVEL: Digoxin Level: 0.5 ng/mL — ABNORMAL LOW (ref 0.8–2.0)

## 2016-01-21 LAB — BASIC METABOLIC PANEL
Anion gap: 5 (ref 5–15)
BUN: 24 mg/dL — ABNORMAL HIGH (ref 6–20)
CHLORIDE: 101 mmol/L (ref 101–111)
CO2: 33 mmol/L — AB (ref 22–32)
CREATININE: 0.6 mg/dL — AB (ref 0.61–1.24)
Calcium: 8.4 mg/dL — ABNORMAL LOW (ref 8.9–10.3)
GFR calc non Af Amer: >60 mL/min (ref >60)
Glucose, Bld: 226 mg/dL — ABNORMAL HIGH (ref 65–99)
Potassium: 3.9 mmol/L (ref 3.5–5.1)
Sodium: 139 mmol/L (ref 135–145)

## 2016-01-21 LAB — C DIFFICILE QUICK SCREEN W PCR REFLEX
C DIFFICLE (CDIFF) ANTIGEN: NEGATIVE
C Diff interpretation: NOT DETECTED
C Diff toxin: NEGATIVE

## 2016-01-22 LAB — CBC WITH DIFFERENTIAL/PLATELET
Basophils Absolute: 0 10*3/uL (ref 0.0–0.1)
Basophils Relative: 0 %
EOS ABS: 0 10*3/uL (ref 0.0–0.7)
EOS PCT: 1 %
HCT: 26.6 % — ABNORMAL LOW (ref 39.0–52.0)
Hemoglobin: 8.1 g/dL — ABNORMAL LOW (ref 13.0–17.0)
LYMPHS ABS: 1.2 10*3/uL (ref 0.7–4.0)
Lymphocytes Relative: 18 %
MCH: 32.5 pg (ref 26.0–34.0)
MCHC: 30.5 g/dL (ref 30.0–36.0)
MCV: 106.8 fL — ABNORMAL HIGH (ref 78.0–100.0)
MONO ABS: 0.4 10*3/uL (ref 0.1–1.0)
MONOS PCT: 6 %
Neutro Abs: 4.8 10*3/uL (ref 1.7–7.7)
Neutrophils Relative %: 75 %
PLATELETS: 160 10*3/uL (ref 150–400)
RBC: 2.49 MIL/uL — ABNORMAL LOW (ref 4.22–5.81)
RDW: 17.1 % — AB (ref 11.5–15.5)
WBC: 6.4 10*3/uL (ref 4.0–10.5)

## 2016-01-22 LAB — URINE CULTURE: Culture: NO GROWTH

## 2016-01-22 LAB — BASIC METABOLIC PANEL
Anion gap: 5 (ref 5–15)
BUN: 27 mg/dL — AB (ref 6–20)
CO2: 33 mmol/L — ABNORMAL HIGH (ref 22–32)
CREATININE: 0.7 mg/dL (ref 0.61–1.24)
Calcium: 7.5 mg/dL — ABNORMAL LOW (ref 8.9–10.3)
Chloride: 103 mmol/L (ref 101–111)
GFR calc Af Amer: >60 mL/min (ref >60)
Glucose, Bld: 111 mg/dL — ABNORMAL HIGH (ref 65–99)
Potassium: 4.7 mmol/L (ref 3.5–5.1)
SODIUM: 141 mmol/L (ref 135–145)

## 2016-01-23 LAB — CULTURE, RESPIRATORY

## 2016-01-23 LAB — CULTURE, RESPIRATORY W GRAM STAIN

## 2016-01-26 LAB — BASIC METABOLIC PANEL
ANION GAP: 7 (ref 5–15)
BUN: 29 mg/dL — AB (ref 6–20)
CHLORIDE: 104 mmol/L (ref 101–111)
CO2: 30 mmol/L (ref 22–32)
CREATININE: 0.67 mg/dL (ref 0.61–1.24)
Calcium: 8.5 mg/dL — ABNORMAL LOW (ref 8.9–10.3)
GFR calc non Af Amer: >60 mL/min (ref >60)
GLUCOSE: 108 mg/dL — AB (ref 65–99)
POTASSIUM: 3.5 mmol/L (ref 3.5–5.1)
SODIUM: 141 mmol/L (ref 135–145)

## 2016-01-26 LAB — CBC
HCT: 27.3 % — ABNORMAL LOW (ref 39.0–52.0)
HEMOGLOBIN: 8.6 g/dL — AB (ref 13.0–17.0)
MCH: 32.5 pg (ref 26.0–34.0)
MCHC: 31.5 g/dL (ref 30.0–36.0)
MCV: 103 fL — ABNORMAL HIGH (ref 78.0–100.0)
Platelets: 186 10*3/uL (ref 150–400)
RBC: 2.65 MIL/uL — AB (ref 4.22–5.81)
RDW: 17 % — ABNORMAL HIGH (ref 11.5–15.5)
WBC: 7.3 10*3/uL (ref 4.0–10.5)

## 2016-01-27 LAB — CBC
HCT: 27.7 % — ABNORMAL LOW (ref 39.0–52.0)
Hemoglobin: 8.4 g/dL — ABNORMAL LOW (ref 13.0–17.0)
MCH: 31.9 pg (ref 26.0–34.0)
MCHC: 30.3 g/dL (ref 30.0–36.0)
MCV: 105.3 fL — ABNORMAL HIGH (ref 78.0–100.0)
PLATELETS: 207 10*3/uL (ref 150–400)
RBC: 2.63 MIL/uL — ABNORMAL LOW (ref 4.22–5.81)
RDW: 17.2 % — ABNORMAL HIGH (ref 11.5–15.5)
WBC: 7.1 10*3/uL (ref 4.0–10.5)

## 2016-01-28 LAB — HEMOGLOBIN AND HEMATOCRIT, BLOOD
HCT: 26.1 % — ABNORMAL LOW (ref 39.0–52.0)
HCT: 27.4 % — ABNORMAL LOW (ref 39.0–52.0)
HEMATOCRIT: 27.5 % — AB (ref 39.0–52.0)
HEMATOCRIT: 25.7 % — AB (ref 39.0–52.0)
HEMOGLOBIN: 8.2 g/dL — AB (ref 13.0–17.0)
Hemoglobin: 8.1 g/dL — ABNORMAL LOW (ref 13.0–17.0)
Hemoglobin: 8.4 g/dL — ABNORMAL LOW (ref 13.0–17.0)
Hemoglobin: 8.4 g/dL — ABNORMAL LOW (ref 13.0–17.0)

## 2016-01-28 LAB — PREPARE RBC (CROSSMATCH)

## 2016-01-28 LAB — ABO/RH: ABO/RH(D): O NEG

## 2016-01-29 LAB — CBC
HCT: 26.7 % — ABNORMAL LOW (ref 39.0–52.0)
HEMOGLOBIN: 8.4 g/dL — AB (ref 13.0–17.0)
MCH: 32.8 pg (ref 26.0–34.0)
MCHC: 31.5 g/dL (ref 30.0–36.0)
MCV: 104.3 fL — ABNORMAL HIGH (ref 78.0–100.0)
PLATELETS: 223 10*3/uL (ref 150–400)
RBC: 2.56 MIL/uL — AB (ref 4.22–5.81)
RDW: 17.4 % — ABNORMAL HIGH (ref 11.5–15.5)
WBC: 7.4 10*3/uL (ref 4.0–10.5)

## 2016-01-29 LAB — HEMOGLOBIN AND HEMATOCRIT, BLOOD
HCT: 27.9 % — ABNORMAL LOW (ref 39.0–52.0)
HCT: 28 % — ABNORMAL LOW (ref 39.0–52.0)
HCT: 29.5 % — ABNORMAL LOW (ref 39.0–52.0)
HEMATOCRIT: 26.3 % — AB (ref 39.0–52.0)
HEMATOCRIT: 29 % — AB (ref 39.0–52.0)
HEMOGLOBIN: 8.3 g/dL — AB (ref 13.0–17.0)
HEMOGLOBIN: 9 g/dL — AB (ref 13.0–17.0)
Hemoglobin: 8.5 g/dL — ABNORMAL LOW (ref 13.0–17.0)
Hemoglobin: 8.6 g/dL — ABNORMAL LOW (ref 13.0–17.0)
Hemoglobin: 8.9 g/dL — ABNORMAL LOW (ref 13.0–17.0)

## 2016-01-29 LAB — BASIC METABOLIC PANEL
Anion gap: 7 (ref 5–15)
BUN: 27 mg/dL — ABNORMAL HIGH (ref 6–20)
CO2: 27 mmol/L (ref 22–32)
CREATININE: 0.71 mg/dL (ref 0.61–1.24)
Calcium: 8.6 mg/dL — ABNORMAL LOW (ref 8.9–10.3)
Chloride: 108 mmol/L (ref 101–111)
Glucose, Bld: 113 mg/dL — ABNORMAL HIGH (ref 65–99)
POTASSIUM: 4.2 mmol/L (ref 3.5–5.1)
SODIUM: 142 mmol/L (ref 135–145)

## 2016-01-29 LAB — PROTIME-INR
INR: 1.15
Prothrombin Time: 14.7 seconds (ref 11.4–15.2)

## 2016-01-30 ENCOUNTER — Other Ambulatory Visit (HOSPITAL_COMMUNITY): Payer: Self-pay

## 2016-01-30 LAB — HEMOGLOBIN AND HEMATOCRIT, BLOOD
HCT: 26.4 % — ABNORMAL LOW (ref 39.0–52.0)
Hemoglobin: 8.3 g/dL — ABNORMAL LOW (ref 13.0–17.0)

## 2016-01-31 LAB — TYPE AND SCREEN
ABO/RH(D): O NEG
Antibody Screen: NEGATIVE
UNIT DIVISION: 0
Unit division: 0

## 2016-02-01 LAB — CBC
HCT: 30.5 % — ABNORMAL LOW (ref 39.0–52.0)
HEMOGLOBIN: 9.5 g/dL — AB (ref 13.0–17.0)
MCH: 32.2 pg (ref 26.0–34.0)
MCHC: 31.1 g/dL (ref 30.0–36.0)
MCV: 103.4 fL — AB (ref 78.0–100.0)
Platelets: 238 10*3/uL (ref 150–400)
RBC: 2.95 MIL/uL — AB (ref 4.22–5.81)
RDW: 17.9 % — ABNORMAL HIGH (ref 11.5–15.5)
WBC: 7 10*3/uL (ref 4.0–10.5)

## 2016-02-01 LAB — BASIC METABOLIC PANEL
Anion gap: 8 (ref 5–15)
BUN: 18 mg/dL (ref 6–20)
CHLORIDE: 102 mmol/L (ref 101–111)
CO2: 27 mmol/L (ref 22–32)
Calcium: 9 mg/dL (ref 8.9–10.3)
Creatinine, Ser: 0.84 mg/dL (ref 0.61–1.24)
GFR calc Af Amer: >60 mL/min (ref >60)
GFR calc non Af Amer: >60 mL/min (ref >60)
GLUCOSE: 119 mg/dL — AB (ref 65–99)
POTASSIUM: 4.3 mmol/L (ref 3.5–5.1)
SODIUM: 137 mmol/L (ref 135–145)

## 2016-02-06 LAB — CBC
HEMATOCRIT: 28.2 % — AB (ref 39.0–52.0)
HEMOGLOBIN: 8.8 g/dL — AB (ref 13.0–17.0)
MCH: 32.2 pg (ref 26.0–34.0)
MCHC: 31.2 g/dL (ref 30.0–36.0)
MCV: 103.3 fL — AB (ref 78.0–100.0)
Platelets: 218 10*3/uL (ref 150–400)
RBC: 2.73 MIL/uL — AB (ref 4.22–5.81)
RDW: 18.4 % — ABNORMAL HIGH (ref 11.5–15.5)
WBC: 6 10*3/uL (ref 4.0–10.5)

## 2016-02-06 LAB — BASIC METABOLIC PANEL
Anion gap: 8 (ref 5–15)
BUN: 17 mg/dL (ref 6–20)
CHLORIDE: 96 mmol/L — AB (ref 101–111)
CO2: 32 mmol/L (ref 22–32)
CREATININE: 0.94 mg/dL (ref 0.61–1.24)
Calcium: 9 mg/dL (ref 8.9–10.3)
GFR calc Af Amer: >60 mL/min (ref >60)
GFR calc non Af Amer: >60 mL/min (ref >60)
GLUCOSE: 100 mg/dL — AB (ref 65–99)
POTASSIUM: 4 mmol/L (ref 3.5–5.1)
SODIUM: 136 mmol/L (ref 135–145)

## 2016-02-09 LAB — CBC
HEMATOCRIT: 30.8 % — AB (ref 39.0–52.0)
HEMOGLOBIN: 9.6 g/dL — AB (ref 13.0–17.0)
MCH: 32.4 pg (ref 26.0–34.0)
MCHC: 31.2 g/dL (ref 30.0–36.0)
MCV: 104.1 fL — ABNORMAL HIGH (ref 78.0–100.0)
Platelets: 217 10*3/uL (ref 150–400)
RBC: 2.96 MIL/uL — AB (ref 4.22–5.81)
RDW: 18.1 % — ABNORMAL HIGH (ref 11.5–15.5)
WBC: 9.8 10*3/uL (ref 4.0–10.5)

## 2016-02-09 LAB — BASIC METABOLIC PANEL
ANION GAP: 10 (ref 5–15)
BUN: 19 mg/dL (ref 6–20)
CHLORIDE: 93 mmol/L — AB (ref 101–111)
CO2: 31 mmol/L (ref 22–32)
CREATININE: 0.9 mg/dL (ref 0.61–1.24)
Calcium: 9.2 mg/dL (ref 8.9–10.3)
GFR calc non Af Amer: >60 mL/min (ref >60)
Glucose, Bld: 100 mg/dL — ABNORMAL HIGH (ref 65–99)
Potassium: 3.8 mmol/L (ref 3.5–5.1)
SODIUM: 134 mmol/L — AB (ref 135–145)

## 2016-02-12 ENCOUNTER — Institutional Professional Consult (permissible substitution) (HOSPITAL_COMMUNITY): Payer: Self-pay

## 2016-02-12 DIAGNOSIS — J90 Pleural effusion, not elsewhere classified: Secondary | ICD-10-CM | POA: Diagnosis not present

## 2016-02-13 LAB — BASIC METABOLIC PANEL
Anion gap: 11 (ref 5–15)
BUN: 15 mg/dL (ref 6–20)
CHLORIDE: 96 mmol/L — AB (ref 101–111)
CO2: 26 mmol/L (ref 22–32)
Calcium: 9.4 mg/dL (ref 8.9–10.3)
Creatinine, Ser: 0.91 mg/dL (ref 0.61–1.24)
GFR calc Af Amer: >60 mL/min (ref >60)
GFR calc non Af Amer: >60 mL/min (ref >60)
Glucose, Bld: 111 mg/dL — ABNORMAL HIGH (ref 65–99)
POTASSIUM: 3.6 mmol/L (ref 3.5–5.1)
SODIUM: 133 mmol/L — AB (ref 135–145)

## 2016-02-13 LAB — CBC
HEMATOCRIT: 30.5 % — AB (ref 39.0–52.0)
HEMOGLOBIN: 9.9 g/dL — AB (ref 13.0–17.0)
MCH: 32.7 pg (ref 26.0–34.0)
MCHC: 32.5 g/dL (ref 30.0–36.0)
MCV: 100.7 fL — AB (ref 78.0–100.0)
Platelets: 246 10*3/uL (ref 150–400)
RBC: 3.03 MIL/uL — AB (ref 4.22–5.81)
RDW: 17.2 % — ABNORMAL HIGH (ref 11.5–15.5)
WBC: 10.1 10*3/uL (ref 4.0–10.5)

## 2016-02-15 ENCOUNTER — Encounter: Payer: Self-pay | Admitting: Physical Medicine and Rehabilitation

## 2016-02-15 ENCOUNTER — Encounter (HOSPITAL_COMMUNITY): Payer: Self-pay | Admitting: Nurse Practitioner

## 2016-02-15 ENCOUNTER — Inpatient Hospital Stay (HOSPITAL_COMMUNITY)
Admission: RE | Admit: 2016-02-15 | Discharge: 2016-02-23 | DRG: 560 | Disposition: A | Discharge status: Home Health | Payer: Medicare Other | Source: Other Acute Inpatient Hospital | Attending: Physical Medicine & Rehabilitation | Admitting: Physical Medicine & Rehabilitation

## 2016-02-15 ENCOUNTER — Inpatient Hospital Stay (HOSPITAL_COMMUNITY): Payer: Medicare Other

## 2016-02-15 DIAGNOSIS — J449 Chronic obstructive pulmonary disease, unspecified: Secondary | ICD-10-CM | POA: Diagnosis present

## 2016-02-15 DIAGNOSIS — I5022 Chronic systolic (congestive) heart failure: Secondary | ICD-10-CM | POA: Diagnosis present

## 2016-02-15 DIAGNOSIS — M1990 Unspecified osteoarthritis, unspecified site: Secondary | ICD-10-CM | POA: Diagnosis present

## 2016-02-15 DIAGNOSIS — K760 Fatty (change of) liver, not elsewhere classified: Secondary | ICD-10-CM | POA: Diagnosis present

## 2016-02-15 DIAGNOSIS — W010XXD Fall on same level from slipping, tripping and stumbling without subsequent striking against object, subsequent encounter: Secondary | ICD-10-CM | POA: Diagnosis present

## 2016-02-15 DIAGNOSIS — I255 Ischemic cardiomyopathy: Secondary | ICD-10-CM | POA: Diagnosis present

## 2016-02-15 DIAGNOSIS — I5023 Acute on chronic systolic (congestive) heart failure: Secondary | ICD-10-CM | POA: Diagnosis present

## 2016-02-15 DIAGNOSIS — E871 Hypo-osmolality and hyponatremia: Secondary | ICD-10-CM | POA: Diagnosis present

## 2016-02-15 DIAGNOSIS — D638 Anemia in other chronic diseases classified elsewhere: Secondary | ICD-10-CM | POA: Diagnosis present

## 2016-02-15 DIAGNOSIS — R5381 Other malaise: Secondary | ICD-10-CM | POA: Diagnosis present

## 2016-02-15 DIAGNOSIS — I959 Hypotension, unspecified: Secondary | ICD-10-CM | POA: Diagnosis present

## 2016-02-15 DIAGNOSIS — Z79899 Other long term (current) drug therapy: Secondary | ICD-10-CM | POA: Diagnosis not present

## 2016-02-15 DIAGNOSIS — I482 Chronic atrial fibrillation: Secondary | ICD-10-CM | POA: Diagnosis not present

## 2016-02-15 DIAGNOSIS — Z23 Encounter for immunization: Secondary | ICD-10-CM

## 2016-02-15 DIAGNOSIS — I509 Heart failure, unspecified: Secondary | ICD-10-CM | POA: Diagnosis not present

## 2016-02-15 DIAGNOSIS — I481 Persistent atrial fibrillation: Secondary | ICD-10-CM | POA: Diagnosis present

## 2016-02-15 DIAGNOSIS — J8410 Pulmonary fibrosis, unspecified: Secondary | ICD-10-CM | POA: Diagnosis present

## 2016-02-15 DIAGNOSIS — L409 Psoriasis, unspecified: Secondary | ICD-10-CM | POA: Diagnosis not present

## 2016-02-15 DIAGNOSIS — J984 Other disorders of lung: Secondary | ICD-10-CM | POA: Diagnosis not present

## 2016-02-15 DIAGNOSIS — S32402D Unspecified fracture of left acetabulum, subsequent encounter for fracture with routine healing: Secondary | ICD-10-CM | POA: Diagnosis not present

## 2016-02-15 DIAGNOSIS — I110 Hypertensive heart disease with heart failure: Secondary | ICD-10-CM | POA: Diagnosis present

## 2016-02-15 DIAGNOSIS — E538 Deficiency of other specified B group vitamins: Secondary | ICD-10-CM | POA: Diagnosis present

## 2016-02-15 DIAGNOSIS — S2242XD Multiple fractures of ribs, left side, subsequent encounter for fracture with routine healing: Secondary | ICD-10-CM | POA: Diagnosis not present

## 2016-02-15 DIAGNOSIS — I4819 Other persistent atrial fibrillation: Secondary | ICD-10-CM | POA: Diagnosis present

## 2016-02-15 DIAGNOSIS — I5033 Acute on chronic diastolic (congestive) heart failure: Secondary | ICD-10-CM | POA: Diagnosis not present

## 2016-02-15 DIAGNOSIS — F17210 Nicotine dependence, cigarettes, uncomplicated: Secondary | ICD-10-CM | POA: Diagnosis present

## 2016-02-15 DIAGNOSIS — S32402A Unspecified fracture of left acetabulum, initial encounter for closed fracture: Secondary | ICD-10-CM

## 2016-02-15 DIAGNOSIS — K709 Alcoholic liver disease, unspecified: Secondary | ICD-10-CM | POA: Diagnosis present

## 2016-02-15 DIAGNOSIS — R0602 Shortness of breath: Secondary | ICD-10-CM

## 2016-02-15 DIAGNOSIS — M7989 Other specified soft tissue disorders: Secondary | ICD-10-CM | POA: Diagnosis not present

## 2016-02-15 LAB — MRSA PCR SCREENING: MRSA BY PCR: NEGATIVE

## 2016-02-15 MED ORDER — FOLIC ACID 1 MG PO TABS
1.0000 mg | ORAL_TABLET | Freq: Every day | ORAL | Status: DC
  Administered 2016-02-16 – 2016-02-23 (×8): 1 mg via ORAL
  Filled 2016-02-15 (×8): qty 1

## 2016-02-15 MED ORDER — TRIAMCINOLONE 0.1 % CREAM:EUCERIN CREAM 1:1
TOPICAL_CREAM | Freq: Two times a day (BID) | CUTANEOUS | Status: DC
  Administered 2016-02-16 – 2016-02-23 (×10): via TOPICAL
  Filled 2016-02-15: qty 1

## 2016-02-15 MED ORDER — GUAIFENESIN-DM 100-10 MG/5ML PO SYRP: 5.0000 mL | ORAL_SOLUTION | Freq: Four times a day (QID) | ORAL | Status: DC | PRN

## 2016-02-15 MED ORDER — BISACODYL 10 MG RE SUPP
10.0000 mg | Freq: Every day | RECTAL | Status: DC | PRN
  Filled 2016-02-15: qty 1

## 2016-02-15 MED ORDER — IPRATROPIUM-ALBUTEROL 0.5-2.5 (3) MG/3ML IN SOLN
3.0000 mL | Freq: Three times a day (TID) | RESPIRATORY_TRACT | Status: DC
  Administered 2016-02-15: 3 mL via RESPIRATORY_TRACT

## 2016-02-15 MED ORDER — PROCHLORPERAZINE EDISYLATE 5 MG/ML IJ SOLN: 5.0000 mg | Freq: Four times a day (QID) | INTRAMUSCULAR | Status: DC | PRN

## 2016-02-15 MED ORDER — FLEET ENEMA 7-19 GM/118ML RE ENEM: 1.0000 | ENEMA | Freq: Once | RECTAL | Status: DC | PRN

## 2016-02-15 MED ORDER — TRAZODONE HCL 50 MG PO TABS: 25.0000 mg | ORAL_TABLET | Freq: Every evening | ORAL | Status: DC | PRN

## 2016-02-15 MED ORDER — UMECLIDINIUM BROMIDE 62.5 MCG/INH IN AEPB
1.0000 | INHALATION_SPRAY | Freq: Every day | RESPIRATORY_TRACT | Status: DC
  Administered 2016-02-15 – 2016-02-23 (×8): 1 via RESPIRATORY_TRACT
  Filled 2016-02-15 (×2): qty 7

## 2016-02-15 MED ORDER — ACETAMINOPHEN 325 MG PO TABS
325.0000 mg | ORAL_TABLET | ORAL | Status: DC | PRN
  Administered 2016-02-18 – 2016-02-19 (×3): 650 mg via ORAL
  Filled 2016-02-15 (×3): qty 2

## 2016-02-15 MED ORDER — PROCHLORPERAZINE 25 MG RE SUPP: 12.5000 mg | Freq: Four times a day (QID) | RECTAL | Status: DC | PRN

## 2016-02-15 MED ORDER — ALUM & MAG HYDROXIDE-SIMETH 200-200-20 MG/5ML PO SUSP
30.0000 mL | ORAL | Status: DC | PRN
  Administered 2016-02-18: 30 mL via ORAL
  Filled 2016-02-15: qty 30

## 2016-02-15 MED ORDER — DIPHENHYDRAMINE HCL 12.5 MG/5ML PO ELIX: 12.5000 mg | ORAL_SOLUTION | Freq: Four times a day (QID) | ORAL | Status: DC | PRN

## 2016-02-15 MED ORDER — PNEUMOCOCCAL VAC POLYVALENT 25 MCG/0.5ML IJ INJ
0.5000 mL | INJECTION | INTRAMUSCULAR | Status: AC
  Administered 2016-02-16: 0.5 mL via INTRAMUSCULAR
  Filled 2016-02-15 (×2): qty 0.5

## 2016-02-15 MED ORDER — FLUTICASONE FUROATE-VILANTEROL 100-25 MCG/INH IN AEPB
1.0000 | INHALATION_SPRAY | Freq: Every day | RESPIRATORY_TRACT | Status: DC
  Administered 2016-02-15 – 2016-02-23 (×8): 1 via RESPIRATORY_TRACT
  Filled 2016-02-15: qty 28

## 2016-02-15 MED ORDER — AMIODARONE HCL 100 MG PO TABS
100.0000 mg | ORAL_TABLET | Freq: Every day | ORAL | Status: DC
  Administered 2016-02-16 – 2016-02-23 (×8): 100 mg via ORAL
  Filled 2016-02-15 (×8): qty 1

## 2016-02-15 MED ORDER — FUROSEMIDE 40 MG PO TABS
40.0000 mg | ORAL_TABLET | Freq: Two times a day (BID) | ORAL | Status: DC
  Administered 2016-02-15 – 2016-02-18 (×7): 40 mg via ORAL
  Filled 2016-02-15 (×8): qty 1

## 2016-02-15 MED ORDER — SENNOSIDES-DOCUSATE SODIUM 8.6-50 MG PO TABS
1.0000 | ORAL_TABLET | Freq: Every day | ORAL | Status: DC
  Administered 2016-02-15 – 2016-02-22 (×8): 1 via ORAL
  Filled 2016-02-15 (×8): qty 1

## 2016-02-15 MED ORDER — NAPHAZOLINE-GLYCERIN 0.012-0.2 % OP SOLN
2.0000 [drp] | Freq: Two times a day (BID) | OPHTHALMIC | Status: DC
  Administered 2016-02-16 – 2016-02-21 (×11): 2 [drp] via OPHTHALMIC
  Filled 2016-02-15: qty 15

## 2016-02-15 MED ORDER — QUETIAPINE FUMARATE 50 MG PO TABS
50.0000 mg | ORAL_TABLET | Freq: Every day | ORAL | Status: DC
  Administered 2016-02-15 – 2016-02-22 (×8): 50 mg via ORAL
  Filled 2016-02-15 (×8): qty 1

## 2016-02-15 MED ORDER — ZOLPIDEM TARTRATE 5 MG PO TABS: 5.0000 mg | ORAL_TABLET | Freq: Every evening | ORAL | Status: DC | PRN

## 2016-02-15 MED ORDER — TRAMADOL HCL 50 MG PO TABS: 50.0000 mg | ORAL_TABLET | Freq: Four times a day (QID) | ORAL | Status: DC | PRN

## 2016-02-15 MED ORDER — PANTOPRAZOLE SODIUM 40 MG PO TBEC
40.0000 mg | DELAYED_RELEASE_TABLET | Freq: Every day | ORAL | Status: DC
  Administered 2016-02-16 – 2016-02-23 (×8): 40 mg via ORAL
  Filled 2016-02-15 (×8): qty 1

## 2016-02-15 MED ORDER — AQUAPHOR EX OINT
TOPICAL_OINTMENT | Freq: Two times a day (BID) | CUTANEOUS | Status: DC
  Administered 2016-02-15 – 2016-02-23 (×13): via TOPICAL
  Filled 2016-02-15 (×2): qty 50

## 2016-02-15 MED ORDER — TAB-A-VITE/IRON PO TABS
1.0000 | ORAL_TABLET | Freq: Every day | ORAL | Status: DC
  Administered 2016-02-15 – 2016-02-23 (×8): 1 via ORAL
  Filled 2016-02-15 (×9): qty 1

## 2016-02-15 MED ORDER — SERTRALINE HCL 50 MG PO TABS
75.0000 mg | ORAL_TABLET | Freq: Every day | ORAL | Status: DC
  Administered 2016-02-16 – 2016-02-23 (×8): 75 mg via ORAL
  Filled 2016-02-15 (×9): qty 2

## 2016-02-15 MED ORDER — MIRTAZAPINE 15 MG PO TABS
15.0000 mg | ORAL_TABLET | Freq: Every day | ORAL | Status: DC
  Administered 2016-02-15 – 2016-02-22 (×8): 15 mg via ORAL
  Filled 2016-02-15 (×8): qty 1

## 2016-02-15 MED ORDER — PROCHLORPERAZINE MALEATE 5 MG PO TABS: 5.0000 mg | ORAL_TABLET | Freq: Four times a day (QID) | ORAL | Status: DC | PRN

## 2016-02-15 MED ORDER — VITAMIN B-1 100 MG PO TABS
100.0000 mg | ORAL_TABLET | Freq: Every day | ORAL | Status: DC
  Administered 2016-02-16 – 2016-02-23 (×8): 100 mg via ORAL
  Filled 2016-02-15 (×8): qty 1

## 2016-02-15 MED ORDER — LISINOPRIL 5 MG PO TABS
5.0000 mg | ORAL_TABLET | Freq: Every day | ORAL | Status: DC
  Administered 2016-02-16 – 2016-02-17 (×2): 5 mg via ORAL
  Filled 2016-02-15 (×5): qty 1

## 2016-02-15 MED ORDER — ENOXAPARIN SODIUM 40 MG/0.4ML ~~LOC~~ SOLN
40.0000 mg | SUBCUTANEOUS | Status: DC
  Administered 2016-02-15 – 2016-02-22 (×8): 40 mg via SUBCUTANEOUS
  Filled 2016-02-15 (×8): qty 0.4

## 2016-02-15 MED ORDER — OXYCODONE HCL 5 MG PO TABS
5.0000 mg | ORAL_TABLET | Freq: Every day | ORAL | Status: DC
  Administered 2016-02-16 – 2016-02-21 (×6): 5 mg via ORAL
  Filled 2016-02-15 (×6): qty 1

## 2016-02-15 MED ORDER — OXYCODONE HCL 5 MG PO TABS
5.0000 mg | ORAL_TABLET | Freq: Four times a day (QID) | ORAL | Status: DC | PRN
Start: 2016-02-15 — End: 2016-02-20
  Administered 2016-02-15 – 2016-02-17 (×3): 5 mg via ORAL
  Administered 2016-02-18: 15 mg via ORAL
  Filled 2016-02-15 (×3): qty 1

## 2016-02-15 MED ORDER — FLEET ENEMA 7-19 GM/118ML RE ENEM
1.0000 | ENEMA | Freq: Once | RECTAL | Status: AC
  Administered 2016-02-15: 1 via RECTAL
  Filled 2016-02-15: qty 1

## 2016-02-15 NOTE — Progress Notes (Signed)
Patient information reviewed and entered into eRehab system by Ida Uppal, RN, CRRN, PPS Coordinator.  Information including medical coding and functional independence measure will be reviewed and updated through discharge.     Per nursing patient was given "Data Collection Information Summary for Patients in Inpatient Rehabilitation Facilities with attached "Privacy Act Statement-Health Care Records" upon admission.  

## 2016-02-15 NOTE — PMR Pre-admission (Signed)
[facility] PMR Admission Coordinator Pre-Admission Assessment  Patient: Chad Rogers is an 80 y.o., male MRN: [id number] DOB: 1935-10-18 Height: _0  (177.8 cm) Weight: 73.2 kg (161 lb 6.4 oz)  Insurance Information HMO: Yes   PPO:       PCP:       IPA:       80/20:       OTHER:   PRIMARY: UHC medicare      Policy#: [id number]      Subscriber: [name] CM Name: [name]      Phone#:  [phone]     Fax#: [phone] Pre-Cert#: [id number]      Employer:  Retired Benefits:  Phone #: [phone]     Name:  [name]. Date: 05/21/15     Deduct:  $0      Out of Pocket Max: [phone] (met $2230.00)      Life Max: unlimited CIR: $430 copay days 1-4      SNF:  $0 days 1-20; $160 days 21-62; $0 days 63-100 Outpatient: medical necessity     Co-Pay: $40 Home Health: 100%      Co-Pay: none DME: 80%     Co-Pay: 20% Providers: in network  Emergency Contact Information Contact Information    Name Relation Home Work Mobile   [place] Spouse [place] Daughter [phone]     Kathrynn Speed [phone]        Current Medical History  Patient Admitting Diagnosis:  Debility post VDRF, rib fractures  History of Present Illness: A 80 yo male presented to [facility] on 12/28/15 and was then transferred to [facility] 12/29/15.  He suffered a ground level fall.  He suffered left rib fractures at levels 3 through 12 along with pneumothorax.  Patient also had an l3-4 transverse process fracture and a left acetabular fracture.  His pneumothroax progressed and he had a chest tube inserted and was intubated.  Cardiology was consulted for atrial fib and patient was placed on amiodarone.  On 01/10/16 a percutaneous tracheostomy tube was placed.  Weaning from the vent was unsuccessful and the patient was admitted to [facility] on 01/19/16.  Patient improved and was decannulated on 02/09/16.  His diet has been upgraded to  regular diet with thin liquids.  He has been receiving PT/OT/SLP therapies ongoing and is tolerating activity well.  Therapies recommended inpatient rehab.  Patient is to be admitted to acute inpatient rehab today.    Patient's medical record from [facility] has been reviewed by the rehabilitation admission coordinator and physician.  Past Medical History  Past Medical History:  Diagnosis Date  . Alcohol abuse    family reports that [pt will drink at least 6 beers or more a day,   . Allergic rhinitis   . Cardiomyopathy 0.14.1030   Acute systolic CHF at presentation; 05/2009 cardiac cath- 06/01/09  non obst coronary artery disease with EF 25%  . CHF (congestive heart failure) ([place])   . COPD (chronic obstructive pulmonary disease) ([place])   . Coronary artery disease   . Hypertension   . Osteoarthritis   . Psoriasis   . Pulmonary fibrosis ([place])    12/2004 with basilar fibrotic changes;  PFT's 06/07/09 FEV1 1.98 (68%) raio 53 no better after B2,  DLC0105%  . Skin cancer   . Tobacco abuse    100 pack years; quit    Family History   family history includes Aneurysm (age of  onset: 8) in his father; Asthma in his maternal grandmother; Cancer (age of onset: 61) in his mother.  Prior Rehab/Hospitalizations Has the patient had major surgery during 100 days prior to admission? No    Current Medications See MAR from [facility]  Patients Current Diet:   Regular diet, thin liquids  Precautions / Restrictions Precautions Precautions: Fall Precaution Comments: Decannulated on 02/09/16.  Trach dressing in place Restrictions Weight Bearing Restrictions: No LLE Weight Bearing: Weight bearing as tolerated   Has the patient had 2 or more falls or a fall with injury in the past year?No.  Had 1 fall 12/28/15 resulting in current admission and injuries.  Prior Activity Level Community (5-7x/wk): Went out daily.  Was driving.  Prior Functional Level Self Care: Did  the patient need help bathing, dressing, using the toilet or eating?  Independent  Indoor Mobility: Did the patient need assistance with walking from room to room (with or without device)? Independent  Stairs: Did the patient need assistance with internal or external stairs (with or without device)? Independent  Functional Cognition: Did the patient need help planning regular tasks such as shopping or remembering to take medications? [place] / Equipment No device used at home  Prior Device Use: Indicate devices/aids used by the patient prior to current illness, exacerbation or injury? None   Prior Functional Level Current Functional Level  Bed Mobility  Independent  Min assist   Transfers  Independent  Min assist/mod assist   Mobility - Walk/Wheelchair  Independent  Min assist ambulated 10', 12' RW  Upper Body Dressing  Independent  Mod assist   Lower Body Dressing  Independent  Max assist   Grooming  Independent  Min assist   Eating/Drinking  Independent  Other (Set up)   Toilet Transfer  Independent  Min assist (Dependent for toileting hygiene.)   Bladder Continence   Continent  Using urinal at the bedside   Bowel Management  WDL  Having BM evey 2 days   Stair Climbing  Independent Other (Not tried.)   Communication  Intact  Intact   Memory  Intact  Intact   Cooking/Meal Prep  Independent      Housework  Wife does housework    Money Management  Independent    Driving  Yes, was driving.     Special needs/care consideration BiPAP/CPAP No CPM No Continuous Drip IV No Dialysis No         Life Vest No Oxygen None at home, currently 02 1L [place] Special Bed No Trach Size Decannulated 02/09/16 Wound Vac (area) No     Skin Has facial psorriasis, left eye and face very reddened                           Bowel mgmt:Patient reports no BM X 5 days Bladder mgmt: Voiding in urinal Diabetic mgmt No  Previous Home  Environment Living Arrangements: Spouse/significant other (Lived with wife and grandson.)  Lives With: Spouse, Family Available Help at Discharge: Family, Available 24 hours/day Type of Home: Mobile home (single wide mobile home) Home Layout: One level Home Access: Stairs to enter [employer] of Steps: 5-6 step entry  Discharge Living Setting Plans for Discharge Living Setting: Lives with (comment), Mobile Home (Lives with wife and grandson.) Type of Home at Discharge: Mobile home (single wide mobile home) Discharge Home Layout: One level Discharge Home Access: Stairs to enter Entrance Stairs-Number of Steps: 5-6  steps  Social/Family/Support Systems Patient Roles: Spouse, Parent, Other (Comment) (Has a wife, daughter, and grandson.) Contact Information: Edmonia Brevan - daughter - [phone] Anticipated Caregiver: Arthor Gorter - wife Anticipated Caregiver's Contact Information: Benay Spice - wife - [phone] Ability/Limitations of Caregiver: Wife can provide supervision. (Grandson currently not working.  He is 23 yo.) Caregiver Availability: 24/7 Discharge Plan Discussed with Primary Caregiver: Yes Is Caregiver In Agreement with Plan?: Yes Does Caregiver/Family have Issues with Lodging/Transportation while Pt is in Rehab?: No  Goals/Additional Needs Patient/Family Goal for Rehab: PT/OT/SLP mod I and supervision goals Expected length of stay: 7-10 days Cultural Considerations: [employer] Dietary Needs: Regular diet, thin liquids Equipment Needs: TBD Additional Information: Usually eats supper, one meal a day at night. Pt/Family Agrees to Admission and willing to participate: Yes Program Orientation Provided & Reviewed with Pt/Caregiver Including Roles  & Responsibilities: Yes  Patient Condition: I met with patient and his daughter at the bedside.  I have discussed and reviewed the clinicals and I have shared information with rehab MD.  Patient is progressing well requiring  min/mod assist with transfers and he ambulated 10 feet and 12 feet RW at min assist.  He can tolerate and benefit from 3 hours of therapy a day.  He will benefit from the coordinated team approach to his rehab care.  I have approval from rehab MD and will admit to acute inpatient rehab today.  Preadmission Screen Completed By:  Retta Diones, 02/15/2016 12:12 PM ______________________________________________________________________   Discussed status with Dr. Letta Pate on  02/15/16 at 1236 and received telephone approval for admission today.  Admission Coordinator:  Retta Diones, time 1426/Date 02/15/16   Assessment/Plan: Diagnosis: Left acetabular, left sided, multiple rib fractures causing pneumothorax and respiratory failure, status post fall on 12/28/2015 1. Does the need for close, 24 hr/day  Medical supervision in concert with the patient's rehab needs make it unreasonable for this patient to be served in a less intensive setting? Yes 2. Co-Morbidities requiring supervision/potential complications: Transverse process  fracture. Lumbar spine, history of alcohol abuse, history of coronary artery disease, history of hypertension 3. Due to bladder management, bowel management, safety, skin/wound care, disease management, medication administration, pain management and patient education, does the patient require 24 hr/day rehab nursing? Yes 4. Does the patient require coordinated care of a physician, rehab nurse, PT (1-2 hrs/day, 5 days/week) and OT (1-2 hrs/day, 5 days/week) to address physical and functional deficits in the context of the above medical diagnosis(es)? Yes Addressing deficits in the following areas: balance, endurance, locomotion, strength, transferring, bowel/bladder control, bathing, dressing, feeding, grooming, toileting, cognition, speech, language, swallowing and psychosocial support 5. Can the patient actively participate in an intensive therapy program of at least 3 hrs  of therapy 5 days a week? Yes 6. The potential for patient to make measurable gains while on inpatient rehab is good 7. Anticipated functional outcomes upon discharge from inpatients are: modified independent and supervision PT, modified independent and supervision OT, n/a SLP 8. Estimated rehab length of stay to reach the above functional goals is: 7-10d 9.  10. Does the patient have adequate social supports to accommodate these discharge functional goals? Yes 11. Anticipated D/C setting: Home 12. Anticipated post D/C treatments: [place] therapy 13. Overall Rehab/Functional Prognosis: good    RECOMMENDATIONS: This patient's condition is appropriate for continued rehabilitative care in the following setting: CIR Patient has agreed to participate in recommended program. Yes Note that insurance prior authorization may be required for reimbursement for recommended care.  Comment:  Retta Diones 02/15/2016

## 2016-02-15 NOTE — Progress Notes (Signed)
Charlett Blake, MD Physician Signed Physical Medicine and Rehabilitation  PMR Pre-admission Date of Service: 02/15/2016 12:12 PM  Related encounter: Admission (Current) from 01/19/2016 in [facility]       '[]'$ Hide copied text '[]'$ Hover for attribution information   [facility] PMR Admission Coordinator Pre-Admission Assessment  Patient: Chad Rogers is an 80 y.o., male MRN: [id number] DOB: [birth year] Height: '5\' 10"'$  (177.8 cm) Weight: 73.2 kg (161 lb 6.4 oz)  Insurance Information HMO: Yes   PPO:       PCP:       IPA:       80/20:       OTHER:   PRIMARY: UHC medicare      Policy#: [id number]      Subscriber: Chad Rogers CM Name: Chad Rogers      Phone#:  [phone]     Fax#: [phone] Pre-Cert#: [id number]      Employer:  Retired Benefits:  Phone #: [phone]     Name:  Chad Rogers. Date: 05/21/15     Deduct:  $0      Out of Pocket Max: [phone] (met $2230.00)      Life Max: unlimited CIR: $430 copay days 1-4      SNF:  $0 days 1-20; $160 days 21-62; $0 days 63-100 Outpatient: medical necessity     Co-Pay: $40 Home Health: 100%      Co-Pay: none DME: 80%     Co-Pay: 20% Providers: in network  Emergency Contact Information        Contact Information    Name Relation Home Work Mobile   [place] Spouse Chad Rogers Daughter [phone]     Chad Rogers [phone]        Current Medical History  Patient Admitting Diagnosis:  Debility post VDRF, rib fractures  History of Present Illness: A 80 yo male presented to [facility] on 12/28/15 and was then transferred to [facility] 12/29/15.  He suffered a ground level fall.  He suffered left rib fractures at levels 3 through 12 along with pneumothorax.  Patient also had an l3-4 transverse process fracture and a left acetabular fracture.  His pneumothroax progressed and he had a chest tube inserted and was intubated.  Cardiology was consulted  for atrial fib and patient was placed on amiodarone.  On 01/10/16 a percutaneous tracheostomy tube was placed.  Weaning from the vent was unsuccessful and the patient was admitted to [facility] on 01/19/16.  Patient improved and was decannulated on 02/09/16.  His diet has been upgraded to regular diet with thin liquids.  He has been receiving PT/OT/SLP therapies ongoing and is tolerating activity well.  Therapies recommended inpatient rehab.  Patient is to be admitted to acute inpatient rehab today.    Patient's medical record from [facility] has been reviewed by the rehabilitation admission coordinator and physician.  Past Medical History      Past Medical History:  Diagnosis Date  . Alcohol abuse    family reports that [pt will drink at least 6 beers or more a day,   . Allergic rhinitis   . Cardiomyopathy 8.84.1660   Acute systolic CHF at presentation; 05/2009 cardiac cath- 06/01/09  non obst coronary artery disease with EF 25%  . CHF (congestive heart failure) ([place])   . COPD (chronic obstructive pulmonary disease) ([place])   . Coronary artery disease   . Hypertension   . Osteoarthritis   . Psoriasis   .  Pulmonary fibrosis ([place])    12/2004 with basilar fibrotic changes;  PFT's 06/07/09 FEV1 1.98 (68%) raio 53 no better after B2,  DLC0105%  . Skin cancer   . Tobacco abuse    100 pack years; quit    Family History   family history includes Aneurysm (age of onset: 53) in his father; Asthma in his maternal grandmother; Cancer (age of onset: 24) in his mother.  Prior Rehab/Hospitalizations Has the patient had major surgery during 100 days prior to admission? No               Current Medications See MAR from [facility]  Patients Current Diet:   Regular diet, thin liquids  Precautions / Restrictions Precautions Precautions: Fall Precaution Comments: Decannulated on 02/09/16.  Trach dressing in  place Restrictions Weight Bearing Restrictions: No LLE Weight Bearing: Weight bearing as tolerated   Has the patient had 2 or more falls or a fall with injury in the past year?No.  Had 1 fall 12/28/15 resulting in current admission and injuries.  Prior Activity Level Community (5-7x/wk): Went out daily.  Was driving.  Prior Functional Level Self Care: Did the patient need help bathing, dressing, using the toilet or eating?  Independent  Indoor Mobility: Did the patient need assistance with walking from room to room (with or without device)? Independent  Stairs: Did the patient need assistance with internal or external stairs (with or without device)? Independent  Functional Cognition: Did the patient need help planning regular tasks such as shopping or remembering to take medications? [place] / Equipment No device used at home  Prior Device Use: Indicate devices/aids used by the patient prior to current illness, exacerbation or injury? None   Prior Functional Level Current Functional Level  Bed Mobility Independent Min assist  Transfers Independent Min assist/mod assist  Mobility - Walk/Wheelchair Independent Min assist ambulated 10', 12' RW  Upper Body Dressing Independent Mod assist  Lower Body Dressing Independent Max assist  Grooming Independent Min assist  Eating/Drinking Independent Other (Set up)  Toilet Transfer Independent Min assist (Dependent for toileting hygiene.)  Bladder Continence  Continent Using urinal at the bedside  Bowel Management WDL Having BM evey 2 days  Stair Climbing Independent Other (Not tried.)  Communication Intact Intact  Memory Intact Intact  Cooking/Meal Prep Independent     Housework Wife does housework   Money Management Independent   Driving Yes, was driving.     Special needs/care consideration BiPAP/CPAP No CPM No Continuous Drip IV No Dialysis No         Life Vest No Oxygen None at  home, currently 02 1L [place] Special Bed No Trach Size Decannulated 02/09/16 Wound Vac (area) No     Skin Has facial psorriasis, left eye and face very reddened                           Bowel mgmt:Patient reports no BM X 5 days Bladder mgmt: Voiding in urinal Diabetic mgmt No  Previous Home Environment Living Arrangements: Spouse/significant other (Lived with wife and grandson.)  Lives With: Spouse, Family Available Help at Discharge: Family, Available 24 hours/day Type of Home: Mobile home (single wide mobile home) Home Layout: One level Home Access: Stairs to enter [employer] of Steps: 5-6 step entry  Discharge Living Setting Plans for Discharge Living Setting: Lives with (comment), Mobile Home (Lives with wife and grandson.) Type of Home at Discharge: Mobile  home (single wide mobile home) Discharge Home Layout: One level Discharge Home Access: Stairs to enter Entrance Stairs-Number of Steps: 5-6 steps  Social/Family/Support Systems Patient Roles: Spouse, Parent, Other (Comment) (Has a wife, daughter, and grandson.) Contact Information: Edmonia Keith - daughter - [phone] Anticipated Caregiver: Sheppard Luckenbach - wife Anticipated Caregiver's Contact Information: Benay Spice - wife - [phone] Ability/Limitations of Caregiver: Wife can provide supervision. (Grandson currently not working.  He is 8 yo.) Caregiver Availability: 24/7 Discharge Plan Discussed with Primary Caregiver: Yes Is Caregiver In Agreement with Plan?: Yes Does Caregiver/Family have Issues with Lodging/Transportation while Pt is in Rehab?: No  Goals/Additional Needs Patient/Family Goal for Rehab: PT/OT/SLP mod I and supervision goals Expected length of stay: 7-10 days Cultural Considerations: [employer] Dietary Needs: Regular diet, thin liquids Equipment Needs: TBD Additional Information: Usually eats supper, one meal a day at night. Pt/Family Agrees to Admission and willing to participate:  Yes Program Orientation Provided & Reviewed with Pt/Caregiver Including Roles  & Responsibilities: Yes  Patient Condition: I met with patient and his daughter at the bedside.  I have discussed and reviewed the clinicals and I have shared information with rehab MD.  Patient is progressing well requiring min/mod assist with transfers and he ambulated 10 feet and 12 feet RW at min assist.  He can tolerate and benefit from 3 hours of therapy a day.  He will benefit from the coordinated team approach to his rehab care.  I have approval from rehab MD and will admit to acute inpatient rehab today.  Preadmission Screen Completed By:  Retta Diones, 02/15/2016 12:12 PM ______________________________________________________________________   Discussed status with Dr. Letta Pate on  02/15/16 at 1236 and received telephone approval for admission today.  Admission Coordinator:  Retta Diones, time 1426/Date 02/15/16   Assessment/Plan: Diagnosis: Left acetabular, left sided, multiple rib fractures causing pneumothorax and respiratory failure, status post fall on 12/28/2015 1. Does the need for close, 24 hr/day  Medical supervision in concert with the patient's rehab needs make it unreasonable for this patient to be served in a less intensive setting? Yes 2. Co-Morbidities requiring supervision/potential complications: Transverse process  fracture. Lumbar spine, history of alcohol abuse, history of coronary artery disease, history of hypertension 3. Due to bladder management, bowel management, safety, skin/wound care, disease management, medication administration, pain management and patient education, does the patient require 24 hr/day rehab nursing? Yes 4. Does the patient require coordinated care of a physician, rehab nurse, PT (1-2 hrs/day, 5 days/week) and OT (1-2 hrs/day, 5 days/week) to address physical and functional deficits in the context of the above medical diagnosis(es)? Yes Addressing  deficits in the following areas: balance, endurance, locomotion, strength, transferring, bowel/bladder control, bathing, dressing, feeding, grooming, toileting, cognition, speech, language, swallowing and psychosocial support 5. Can the patient actively participate in an intensive therapy program of at least 3 hrs of therapy 5 days a week? Yes 6. The potential for patient to make measurable gains while on inpatient rehab is good 7. Anticipated functional outcomes upon discharge from inpatients are: modified independent and supervision PT, modified independent and supervision OT, n/a SLP 8. Estimated rehab length of stay to reach the above functional goals is: 7-10d 9.  10. Does the patient have adequate social supports to accommodate these discharge functional goals? Yes 11. Anticipated D/C setting: Home 12. Anticipated post D/C treatments: [place] therapy 13. Overall Rehab/Functional Prognosis: good    RECOMMENDATIONS: This patient's condition is appropriate for continued rehabilitative care in the following setting: CIR Patient has  agreed to participate in recommended program. Yes Note that insurance prior authorization may be required for reimbursement for recommended care.  Comment:  Retta Diones 02/15/2016

## 2016-02-15 NOTE — H&P (Signed)
Physical Medicine and Rehabilitation Admission H&P    CC: Debility   HPI: Chad Rogers is a 80 year old male with history of COPD, pulmonary fibrosis, CAD with ICM--EF 25% andCHF, ETOH abuse, HTN who slipped and fell on 12/28/15 with subsequent left 3 rd - 12th rib fractures, left PTX and left chest emphysema, small left pelvic ring/acetabular fracture, L3 and L4 transverse process fractures who developed hypotension with A fib and respiratory failure requiring intubation.  Dr. Marcelino Scot evaluated patient and recommended WBAT and no surgical intervention as fracture felt to be due to  Insufficiency.  Cardiology consulted for management for CHF and for rate control. Medications adjusted and  2 D echo done revealing diffuse hypokineses with EF 25-30%. Rate controlled with amiodarone and CHF managed IV diuresis.  He did develop mottling/coolness of LLE and ABI done revealing severe BLE tibial disease. Dr. Bridgett Larsson recommended monitoring and transient changes felt to be due to decrease in cardiac output affecting perfusion. He had difficulty weaning off vent and tracheostomy performed 8/23. He was transferred to [facility] for vent wean and rehab. He tolerated extubation and was decanulated by 9/22. He was started on oxycodone for reports of diffuse pain. Diet advanced to regular textures, thin by 9/26.  He is tolerating increase in activity with improved participation. CIR was recommended for follow up therapy.    ROS        Past Medical History:  Diagnosis Date  . Alcohol abuse    family reports that [pt will drink at least 6 beers or more a day,   . Allergic rhinitis   . Cardiomyopathy [id number]   Acute systolic CHF at presentation; 05/2009 cardiac cath- 06/01/09  non obst coronary artery disease with EF 25%  . CHF (congestive heart failure) ([place])   . COPD (chronic obstructive pulmonary disease) ([place])   . Coronary artery disease   . Hypertension   . Osteoarthritis   . Psoriasis   .  Pulmonary fibrosis ([place])    12/2004 with basilar fibrotic changes;  PFT's 06/07/09 FEV1 1.98 (68%) raio 53 no better after B2,  DLC0105%  . Skin cancer   . Subdural hygroma 03/2005   bilateral   . Tobacco abuse    100 pack years; quit         Past Surgical History:  Procedure Laterality Date  . HAND SURGERY    . INGUINAL HERNIA REPAIR    . PERCUTANEOUS TRACHEOSTOMY N/A 01/10/2016   Procedure: PERCUTANEOUS TRACHEOSTOMY;  Surgeon: Judeth Horn, MD;  Location: [facility];  Service: General;  Laterality: N/A;  . ROTATOR CUFF REPAIR           Family History  Problem Relation Age of Onset  . Cancer Mother 23    gynecologic  . Aneurysm Father 29    cerebral aneurysm  . Asthma Maternal Grandmother     Social History:  reports that he has been smoking Cigarettes.  He has a 100.00 pack-year smoking history. He has quit using smokeless tobacco.  Per reports he drinks 12 pack beer daily. His drug history is not on file.    Allergies: No Known Allergies          Medications Prior to Admission  Medication Sig Dispense Refill  . acetaminophen (TYLENOL) 325 MG tablet Take 2 tablets (650 mg total) by mouth every 4 (four) hours as needed for mild pain.    Marland Kitchen ALPRAZolam (XANAX) 0.5 MG tablet Take 1 tablet (0.5 mg total) by mouth at bedtime as  needed for sleep.  0  . amiodarone (PACERONE) 400 MG tablet Take 1 tablet (400 mg total) by mouth 2 (two) times daily.    . bethanechol (URECHOLINE) 10 MG tablet Place 1 tablet (10 mg total) into feeding tube 3 (three) times daily.    . bisacodyl (DULCOLAX) 10 MG suppository Place 1 suppository (10 mg total) rectally daily as needed for moderate constipation. 12 suppository 0  . chlorhexidine (PERIDEX) 0.12 % solution Use as directed 15 mLs in the mouth or throat 2 (two) times daily. 120 mL 0  . digoxin (LANOXIN) 0.125 MG tablet Take 1 tablet (0.125 mg total) by mouth daily.    Marland Kitchen enoxaparin (LOVENOX) 40 MG/0.4ML injection  Inject 0.4 mLs (40 mg total) into the skin daily. 0 Syringe   . famotidine (PEPCID) 40 MG/5ML suspension Place 2.5 mLs (20 mg total) into feeding tube daily. 50 mL 0  . folic acid (FOLVITE) 1 MG tablet Take 1 tablet (1 mg total) by mouth daily.    . furosemide (LASIX) 10 MG/ML injection Inject 4 mLs (40 mg total) into the vein 2 (two) times daily. 4 mL 0  . HYDROcodone-acetaminophen (HYCET) 7.5-325 mg/15 ml solution Place 15 mLs into feeding tube every 4 (four) hours as needed for moderate pain. 120 mL 0  . HYDROmorphone (DILAUDID) 1 MG/ML injection Inject 0.5-1 mLs (0.5-1 mg total) into the vein every 4 (four) hours as needed for severe pain (breakthrough pain). 1 mL 0  . insulin aspart (NOVOLOG) 100 UNIT/ML injection Inject 0-15 Units into the skin every 4 (four) hours. 10 mL 11  . ipratropium (ATROVENT) 0.02 % nebulizer solution Take 2.5 mLs (0.5 mg total) by nebulization every 4 (four) hours as needed for wheezing or shortness of breath. 75 mL 12  . levalbuterol (XOPENEX) 0.63 MG/3ML nebulizer solution Take 3 mLs (0.63 mg total) by nebulization every 6 (six) hours as needed for wheezing or shortness of breath. 3 mL 12  . metoprolol tartrate (LOPRESSOR) 25 MG tablet Take 0.5 tablets (12.5 mg total) by mouth 2 (two) times daily.    . Mouthwashes (MOUTH RINSE) LIQD solution 15 mLs by Mouth Rinse route QID.  0  . Multiple Vitamin (MULTIVITAMIN WITH MINERALS) TABS tablet Take 1 tablet by mouth daily.    . Nutritional Supplements (FEEDING SUPPLEMENT, VITAL AF 1.2 CAL,) LIQD Place 1,000 mLs into feeding tube continuous.    . ondansetron (ZOFRAN) 4 MG tablet Take 1 tablet (4 mg total) by mouth every 6 (six) hours as needed for nausea. 20 tablet 0  . potassium chloride 20 MEQ/15ML (10%) SOLN Take 30 mLs (40 mEq total) by mouth 2 (two) times daily. 450 mL 0  . Potassium Chloride in Dextrose (DEXTROSE 5 % WITH KCL 20 MEQ / L) 20-5 MEQ/L-% Inject 1,000 mLs (20 mEq total) into the vein continuous.     Marland Kitchen QUEtiapine (SEROQUEL) 50 MG tablet Place 1 tablet (50 mg total) into feeding tube 2 (two) times daily.    . sertraline (ZOLOFT) 50 MG tablet Take 1 tablet (50 mg total) by mouth daily.    . sodium chloride flush (NS) 0.9 % SOLN 10-40 mLs by Intracatheter route as needed (flush).    . thiamine 100 MG tablet Take 1 tablet (100 mg total) by mouth daily.    . Water For Irrigation, Sterile (FREE WATER) SOLN Place 200 mLs into feeding tube every 8 (eight) hours.      Home: Home Living Living Arrangements: Spouse/significant other (Lived with wife and  grandson.) Available Help at Discharge: Family, Available 24 hours/day Type of Home: Mobile home (single wide mobile home) Home Access: Stairs to enter [employer] of Steps: 5-6 step entry Home Layout: One level  Lives With: Spouse, Family   Functional History: Prior Function Level of Independence: Independent Comments: Used no device  Functional Status:  Mobility:  min to mod assist for bed mobility. Min assist for sit to stand.  Min assist for ambulating 10- 12' with rest breaks.   ADL:    Cognition: Cognition Overall Cognitive Status: Within Functional Limits for tasks assessed Orientation Level: Oriented X4 Cognition Overall Cognitive Status: Within Functional Limits for tasks assessed  Height 5\' 10"  (1.778 m), weight 73.2 kg (161 lb 6.4 oz). Physical Exam  Nursing note and vitals reviewed. Constitutional: He is oriented to person, place, and time. He appears well-developed and well-nourished. He has a sickly appearance. Nasal cannula in place.  Pursed lip breathing at times.   HENT:  Head: Normocephalic and atraumatic.  Mouth/Throat: Oropharynx is clear and moist.  Wear dentures  Eyes: Pupils are equal, round, and reactive to light.  Eye lids/brows crusted. Left ectropion and injected sclera. .    Neck: Normal range of motion. Neck supple.  Dry dressing on neck  Cardiovascular: Normal  rate.  An irregular rhythm present.  Respiratory: Effort normal. No stridor. No respiratory distress. He has decreased breath sounds. He has wheezes. He exhibits tenderness (left chest wall).  GI: Soft. Bowel sounds are normal. He exhibits distension. There is no tenderness.  Neurological: He is alert and oriented to person, place, and time.  Able to follow basic command without difficulty.   Skin: Skin is warm and dry.  Scattered flaky plaques noted.   Psychiatric: His mood appears anxious.    Lab Results Last 48 Hours  No results found for this or any previous visit (from the past 48 hour(s)).   Imaging Results (Last 48 hours)  No results found.       Medical Problem List and Plan: 1.  Deconditioning and left acetabular fracture secondary to fall, resulting in multiple rib fractures and respiratory failure requiring mechanical ventilation and tracheostomy 2.  DVT Prophylaxis/Anticoagulation: will add  Pharmaceutical: Lovenox--check dopplers in am.  3. Pain Management: Will try to minimize use of opiates. Given his history of alcohol abuse 4. Mood: LCSW to follow for evaluation and support.  5. Neuropsych: This patient is capable of making decisions on his own behalf. 6. Skin/Wound Care: Lubricating cream to eye lids. Steroid cream to psoriatic  plaques? 7. Fluids/Electrolytes/Nutrition: Strict I/O. Check lytes in am.  8. CAD with ICM/CHF: Look for signs of overload/Monitor weight daily. Low salt diet. Continue lasix with parameters--hold lisinopril.  9. A fib: Monitor HR bid. On amiodarone.  10. Hypotension:  BP ranges from  80/90 to 110. Monitor BP bid--will set parameters on lasix. Add TEDs 11. Anemia: due to chronic illness and/or Vit B 12 deficiency. Will check anemia panel--MCV 105.  12. Hepatic steatosis/ Alcohol abuse: LFTs/ platelets stable.  13. COPD/ Pulmonary fibrosis: Respiratory status compounded by multiple left rib fractures/left chest contusion. Continue  duonebs, elliptra, and     Post Admission Physician Evaluation: 1. Functional deficits secondary  to left acetabular fracture and deconditioning. 2. Patient is admitted to receive collaborative, interdisciplinary care between the physiatrist, rehab nursing staff, and therapy team. 3. Patient's level of medical complexity and substantial therapy needs in context of that medical necessity cannot be provided at a lesser intensity of  care such as a SNF. 4. Patient has experienced substantial functional loss from his/her baseline which was documented above under the "Functional History" and "Functional Status" headings.  Judging by the patient's diagnosis, physical exam, and functional history, the patient has potential for functional progress which will result in measurable gains while on inpatient rehab.  These gains will be of substantial and practical use upon discharge  in facilitating mobility and self-care at the household level. 5. Physiatrist will provide 24 hour management of medical needs as well as oversight of the therapy plan/treatment and provide guidance as appropriate regarding the interaction of the two. 6. 24 hour rehab nursing will assist with bladder management, bowel management, safety, skin/wound care, disease management, medication administration, pain management and patient education  and help integrate therapy concepts, techniques,education, etc. 7. PT will assess and treat for/with: pre gait, gait training, endurance , safety, equipment, neuromuscular re education.   Goals are: Mod I/ Sup. 8. OT will assess and treat for/with: ADLs, Cognitive perceptual skills, Neuromuscular re education, safety, endurance, equipment.   Goals are: Mod I/ Sup. Therapy may proceed with showering this patient. 9. SLP will assess and treat for/with: eval cognition .  Goals are: Mod I med management and household management. 10. Case Management and Social Worker will assess and treat for  psychological issues and discharge planning. 11. Team conference will be held weekly to assess progress toward goals and to determine barriers to discharge. 12. Patient will receive at least 3 hours of therapy per day at least 5 days per week. 13. ELOS: 18-22d       14. Prognosis:  good     Charlett Blake M.D. [facility] FAAPM&R (Sports Med, Neuromuscular Med) Diplomate [place] Board of Electrodiagnostic Med  02/15/2016

## 2016-02-15 NOTE — Progress Notes (Signed)
Patient ID: Chad Rogers, male   DOB: November 10, 1935, 80 y.o.   MRN: [id number] Patient admitted to [phone] via wheelchair, escorted by nursing staff.  Patient verbalized understanding of rehab process, signed fall safety agreement.  Appears to be in no immediate distress at this time.  Brita Romp, RN

## 2016-02-16 ENCOUNTER — Inpatient Hospital Stay (HOSPITAL_COMMUNITY): Payer: Medicare Other | Admitting: Occupational Therapy

## 2016-02-16 ENCOUNTER — Encounter (HOSPITAL_COMMUNITY): Payer: Medicare Other

## 2016-02-16 ENCOUNTER — Inpatient Hospital Stay (HOSPITAL_COMMUNITY): Payer: Medicare Other | Admitting: Speech Pathology

## 2016-02-16 ENCOUNTER — Inpatient Hospital Stay (HOSPITAL_COMMUNITY): Payer: Medicare Other

## 2016-02-16 ENCOUNTER — Inpatient Hospital Stay (HOSPITAL_COMMUNITY): Payer: Medicare Other | Admitting: Physical Therapy

## 2016-02-16 DIAGNOSIS — S32402D Unspecified fracture of left acetabulum, subsequent encounter for fracture with routine healing: Principal | ICD-10-CM

## 2016-02-16 DIAGNOSIS — M7989 Other specified soft tissue disorders: Secondary | ICD-10-CM

## 2016-02-16 DIAGNOSIS — R5381 Other malaise: Secondary | ICD-10-CM

## 2016-02-16 LAB — COMPREHENSIVE METABOLIC PANEL
ALBUMIN: 2.8 g/dL — AB (ref 3.5–5.0)
ALT: 23 U/L (ref 17–63)
AST: 17 U/L (ref 15–41)
Alkaline Phosphatase: 88 U/L (ref 38–126)
Anion gap: 7 (ref 5–15)
BUN: 14 mg/dL (ref 6–20)
CHLORIDE: 98 mmol/L — AB (ref 101–111)
CO2: 30 mmol/L (ref 22–32)
CREATININE: 0.89 mg/dL (ref 0.61–1.24)
Calcium: 9.1 mg/dL (ref 8.9–10.3)
GFR calc Af Amer: >60 mL/min (ref >60)
GLUCOSE: 100 mg/dL — AB (ref 65–99)
Potassium: 3.7 mmol/L (ref 3.5–5.1)
Sodium: 135 mmol/L (ref 135–145)
Total Bilirubin: 0.9 mg/dL (ref 0.3–1.2)
Total Protein: 6 g/dL — ABNORMAL LOW (ref 6.5–8.1)

## 2016-02-16 LAB — CBC WITH DIFFERENTIAL/PLATELET
BASOS ABS: 0 10*3/uL (ref 0.0–0.1)
Basophils Relative: 1 %
EOS PCT: 2 %
Eosinophils Absolute: 0.2 10*3/uL (ref 0.0–0.7)
HCT: 28 % — ABNORMAL LOW (ref 39.0–52.0)
Hemoglobin: 9 g/dL — ABNORMAL LOW (ref 13.0–17.0)
LYMPHS PCT: 27 %
Lymphs Abs: 1.8 10*3/uL (ref 0.7–4.0)
MCH: 32.4 pg (ref 26.0–34.0)
MCHC: 32.1 g/dL (ref 30.0–36.0)
MCV: 100.7 fL — AB (ref 78.0–100.0)
Monocytes Absolute: 0.7 10*3/uL (ref 0.1–1.0)
Monocytes Relative: 11 %
NEUTROS ABS: 3.9 10*3/uL (ref 1.7–7.7)
Neutrophils Relative %: 59 %
PLATELETS: 254 10*3/uL (ref 150–400)
RBC: 2.78 MIL/uL — AB (ref 4.22–5.81)
RDW: 16.6 % — ABNORMAL HIGH (ref 11.5–15.5)
WBC: 6.6 10*3/uL (ref 4.0–10.5)

## 2016-02-16 MED ORDER — PRO-STAT SUGAR FREE PO LIQD
30.0000 mL | Freq: Two times a day (BID) | ORAL | Status: DC
  Administered 2016-02-16 – 2016-02-17 (×3): 30 mL via ORAL
  Administered 2016-02-18: 22:00:00 via ORAL
  Administered 2016-02-18 – 2016-02-23 (×10): 30 mL via ORAL
  Filled 2016-02-16 (×14): qty 30

## 2016-02-16 MED ORDER — TRIAMCINOLONE ACETONIDE 0.1 % EX CREA
TOPICAL_CREAM | Freq: Two times a day (BID) | CUTANEOUS | Status: DC
  Administered 2016-02-16 – 2016-02-23 (×9): via TOPICAL
  Filled 2016-02-16: qty 15

## 2016-02-16 MED ORDER — BETAMETHASONE VALERATE 0.1 % EX LOTN: TOPICAL_LOTION | Freq: Two times a day (BID) | CUTANEOUS | Status: DC

## 2016-02-16 NOTE — Progress Notes (Signed)
Speech Language Pathology Note  Patient Details  Name: Chad Rogers MRN: [id number] Date of Birth: [birth year] Today's Date: 02/16/2016  In addition to orders for cognitive evaluation, SLP also received orders for evaluation for respiratory muscle strength training program.  Pt admitted with rib fractures and still presents with concomitant pain and discomfort.  As a result RMST is contraindicated at this time.     [name], [name] 02/16/2016, 4:33 PM

## 2016-02-16 NOTE — Progress Notes (Signed)
Social Work Assessment and Plan Social Work Assessment and Plan  Patient Details  Name: Chad Rogers MRN: [id number] Date of Birth: April 07, 1936  Today's Date: 02/16/2016  Problem List:  Patient Active Problem List   Diagnosis Date Noted  . Debility 02/15/2016  . Atrial fibrillation, persistent ([place]) 01/15/2016  . Absent pulse in lower extremity   . Acute on chronic systolic CHF (congestive heart failure) ([place]) 01/01/2016  . NSVT (nonsustained ventricular tachycardia) ([place]) 01/01/2016  . Respiratory failure ([place])   . Traumatic fracture of ribs with pneumothorax 12/29/2015  . Multiple rib fractures 12/29/2015  . Cardiomyopathy ([place])   . Hypertension   . Pulmonary fibrosis ([place])   . Osteoarthritis   . Tobacco abuse    Past Medical History:  Past Medical History:  Diagnosis Date  . Alcohol abuse    family reports that [pt will drink at least 6 beers or more a day,   . Allergic rhinitis   . Cardiomyopathy [id number]   Acute systolic CHF at presentation; 05/2009 cardiac cath- 06/01/09  non obst coronary artery disease with EF 25%  . CHF (congestive heart failure) ([place])   . COPD (chronic obstructive pulmonary disease) ([place])   . Coronary artery disease   . Hypertension   . Osteoarthritis   . Psoriasis   . Pulmonary fibrosis ([place])    12/2004 with basilar fibrotic changes;  PFT's 06/07/09 FEV1 1.98 (68%) raio 53 no better after B2,  DLC0105%  . Skin cancer   . Subdural hygroma 03/2005   bilateral   . Tobacco abuse    100 pack years; quit   Past Surgical History:  Past Surgical History:  Procedure Laterality Date  . HAND SURGERY    . INGUINAL HERNIA REPAIR    . PERCUTANEOUS TRACHEOSTOMY N/A 01/10/2016   Procedure: PERCUTANEOUS TRACHEOSTOMY;  Surgeon: Judeth Horn, MD;  Location: [facility];  Service: General;  Laterality: N/A;  . ROTATOR CUFF REPAIR     Social History:  reports that he has been smoking Cigarettes.  He has a 100.00 pack-year smoking history. He has quit using  smokeless tobacco. He reports that he drinks about 17.5 oz of alcohol per week . His drug history is not on file.  Family / Support Systems Marital Status: Married Patient Roles: Spouse, Parent Spouse/Significant Other: Chad Rogers [phone] Children: Chad daughter [place] Other Supports: Chad Rogers [phone] Anticipated Caregiver: Chad Rogers and maybe the daughter's will rotate if needed. Ability/Limitations of Caregiver: Wife can provide supervision has RA Caregiver Availability: 24/7 Family Dynamics: Close knit family all three daughter's come and see thier Dad. [place] daughter-Rogers keeps all of them informed about his progress and treatment goals.  Social History Preferred language: English Religion: Non-Denominational Cultural Background: No issues Education: High School Read: Yes Write: Yes Employment Status: Retired Freight forwarder Issues: No issues Guardian/Conservator: none-according to MD pt is capable of making his own decisions while here.   Abuse/Neglect Physical Abuse: Denies Verbal Abuse: Denies Sexual Abuse: Denies Exploitation of patient/patient's resources: Denies Self-Neglect: Denies  Emotional Status Pt's affect, behavior adn adjustment status: Pt reports it has been tiring today, he has not been up this much since came into the hospital. He needs rest breaks in between therapies. He will work hard and do what is asked of him, due to he wants to get well and go home. Recent Psychosocial Issues: other health issues-were managed by PCP Pyschiatric History: No history deferred depression screen due to coping appropriately. Will monitor him while  here and have neuro-psych if needed. Substance Abuse History: Tobacco & ETOH aware he needs to quit due to lung issues and bad for your health. He is planning to quit both of them. He is aware of the community sreources available for this  Patient / Family  Perceptions, Expectations & Goals Pt/Family understanding of illness & functional limitations: Pt and daughter can explain his treatment and healing process. He has no idea a fall could od all of this damage to him. Both talk with the MD's and feel their questions are being answered. Premorbid pt/family roles/activities: Husband, father, grandfather, retiree, church member, etc Anticipated changes in roles/activities/participation: resume Pt/family expectations/goals: Pt states: " I will do my part but need a rest in between. "  Daughter states: " We will help but need notice of when he will be going home."  [employer]: None Premorbid Home Care/DME Agencies: None Transportation available at discharge: Family members  Discharge Planning Living Arrangements: Spouse/significant other, Children Support Systems: Spouse/significant other, Children, Other relatives, Water engineer, Social worker community Type of Residence: Private residence Insurance underwriter Resources: Multimedia programmer (specify) Primary school teacher) Financial Resources: Radio broadcast assistant Screen Referred: No Living Expenses: Own Money Management: Spouse, Patient Does the patient have any problems obtaining your medications?: No Home Management: Wife does the home management Patient/Family Preliminary Plans: Return home with wife and grandson-24 yo is not currently employed he can help also. Their three daughter's will assist if needed also. Just need plenty of notice of this. Social Work Anticipated Follow Up Needs: HH/OP, Support Group  Clinical Impression Pleasant gentleman who has worked hard all of this life and will do what is necessary to go home from this. His family is involved and supportive and willing to assist at discharge. He needs to build his strength and endurance back up. He is ready to quit ETOH and tobacco and feels he has a good start already. Await team's evaluations and work on a  safe discharge plan. Wife is only able to provide 24 hr supervision due to her own health issues-RA.  Chad Rogers 02/16/2016, 3:39 PM

## 2016-02-16 NOTE — Care Management Note (Signed)
[facility] Individual Statement of Services  Patient Name:  Chad Rogers  Date:  02/16/2016  Welcome to the [facility].  Our goal is to provide you with an individualized program based on your diagnosis and situation, designed to meet your specific needs.  With this comprehensive rehabilitation program, you will be expected to participate in at least 3 hours of rehabilitation therapies Monday-Friday, with modified therapy programming on the weekends.  Your rehabilitation program will include the following services:  Physical Therapy (PT), Occupational Therapy (OT), Speech Therapy (ST), 24 hour per day rehabilitation nursing, Therapeutic Recreaction (TR), Case Management (Social Worker), Rehabilitation Medicine, Nutrition Services and Pharmacy Services  Weekly team conferences will be held on Wednesday to discuss your progress.  Your Social Worker will talk with you frequently to get your input and to update you on team discussions.  Team conferences with you and your family in attendance may also be held.  Expected length of stay: 14-17 days  Overall anticipated outcome: supervision with cueing  Depending on your progress and recovery, your program may change. Your Social Worker will coordinate services and will keep you informed of any changes. Your Social Worker's name and contact numbers are listed  below.  The following services may also be recommended but are not provided by the [facility] will be made to provide these services after discharge if needed.  Arrangements include referral to agencies that provide these services.  Your insurance has been verified to be:  [employer]-Medicare Your primary doctor is:  Allyn Kenner  Pertinent information will be shared with your doctor and your insurance company.  Social  Worker:  Ovidio Kin, [place] or ([phone]  Information discussed with and copy given to patient by: Elease Hashimoto, 02/16/2016, 9:12 AM

## 2016-02-16 NOTE — Evaluation (Signed)
Occupational Therapy Assessment and Plan  Patient Details  Name: Chad Rogers MRN: [id number] Date of Birth: June 27, 1935  OT Diagnosis: cognitive deficits and muscle weakness (generalized) Rehab Potential: Rehab Potential (ACUTE ONLY): Good ELOS: 14-17 days   Today's Date: 02/16/2016 OT Individual Time: 1300-1400 OT Individual Time Calculation (min): 60 min      Problem List: Patient Active Problem List   Diagnosis Date Noted  . Debility 02/15/2016  . Atrial fibrillation, persistent ([place]) 01/15/2016  . Absent pulse in lower extremity   . Acute on chronic systolic CHF (congestive heart failure) ([place]) 01/01/2016  . NSVT (nonsustained ventricular tachycardia) ([place]) 01/01/2016  . Respiratory failure ([place])   . Traumatic fracture of ribs with pneumothorax 12/29/2015  . Multiple rib fractures 12/29/2015  . Cardiomyopathy ([place])   . Hypertension   . Pulmonary fibrosis ([place])   . Osteoarthritis   . Tobacco abuse     Past Medical History:  Past Medical History:  Diagnosis Date  . Alcohol abuse    family reports that [pt will drink at least 6 beers or more a day,   . Allergic rhinitis   . Cardiomyopathy [id number]   Acute systolic CHF at presentation; 05/2009 cardiac cath- 06/01/09  non obst coronary artery disease with EF 25%  . CHF (congestive heart failure) ([place])   . COPD (chronic obstructive pulmonary disease) ([place])   . Coronary artery disease   . Hypertension   . Osteoarthritis   . Psoriasis   . Pulmonary fibrosis ([place])    12/2004 with basilar fibrotic changes;  PFT's 06/07/09 FEV1 1.98 (68%) raio 53 no better after B2,  DLC0105%  . Skin cancer   . Subdural hygroma 03/2005   bilateral   . Tobacco abuse    100 pack years; quit   Past Surgical History:  Past Surgical History:  Procedure Laterality Date  . HAND SURGERY    . INGUINAL HERNIA REPAIR    . PERCUTANEOUS TRACHEOSTOMY N/A 01/10/2016   Procedure: PERCUTANEOUS TRACHEOSTOMY;  Surgeon: Judeth Horn, MD;  Location: [place];  Service: General;  Laterality: N/A;  . ROTATOR CUFF REPAIR      Assessment & Plan Clinical Impression: Chad Rogers is a 80 year old male with history of COPD, pulmonary fibrosis, CAD with ICM--EF 25% andCHF, ETOH abuse, HTN who slipped and fell on 12/28/15 with subsequent left 3 rd - 12th rib fractures, left PTX and left chest emphysema, small left pelvic ring/acetabular fracture, L3 and L4 transverse process fractures who developed hypotension with A fib and respiratory failure requiring intubation.  Dr. Marcelino Scot evaluated patient and recommended WBAT and no surgical intervention as fracture felt to be due to  Insufficiency.  Cardiology consulted for management for CHF and for rate control. Medications adjusted and  2 D echo done revealing diffuse hypokineses with EF 25-30%. Rate controlled with amiodarone and CHF managed IV diuresis.   He did develop mottling/coolness of LLE and ABI done revealing severe BLE tibial disease. Dr. Bridgett Larsson recommended monitoring and transient changes felt to be due to decrease in cardiac output affecting perfusion. He had difficulty weaning off vent and tracheostomy performed 8/23. He was transferred to [facility] for vent wean and rehab. He tolerated extubation and was decanulated by 9/22. He was started on oxycodone for reports of diffuse pain. Diet advanced to regular textures, thin by 9/26.  He is tolerating increase in activity with improved participation. CIR was recommended for follow up therapy.      Patient transferred to CIR on  02/15/2016 .    Patient currently requires mod with basic self-care skills secondary to muscle weakness, decreased cardiorespiratoy endurance and decreased oxygen support, decreased problem solving, decreased memory and delayed processing and decreased standing balance and decreased balance strategies.  Prior to hospitalization, patient could complete ADLs with independent .  Patient will benefit from skilled intervention to increase  independence with basic self-care skills prior to discharge home with care partner.  Anticipate patient will require intermittent supervision and follow up home health.  OT - End of Session Activity Tolerance: Tolerates < 10 min activity with changes in vital signs Endurance Deficit Description: patient SOB sitting EOB, orthostatic with standing, fatigues quickly with minimal activity OT Assessment Rehab Potential (ACUTE ONLY): Good OT Patient demonstrates impairments in the following area(s): Balance;Cognition;Endurance;Motor OT Basic ADL's Functional Problem(s): Bathing;Dressing;Toileting OT Transfers Functional Problem(s): Toilet;Tub/Shower OT Additional Impairment(s): None OT Plan OT Intensity: Minimum of 1-2 x/day, 45 to 90 minutes OT Frequency: 5 out of 7 days OT Duration/Estimated Length of Stay: 14-17 days OT Treatment/Interventions: Balance/vestibular training;Cognitive remediation/compensation;DME/adaptive equipment instruction;Discharge planning;Functional mobility training;Patient/family education;Self Care/advanced ADL retraining;UE/LE Strength taining/ROM;Therapeutic Exercise;Therapeutic Activities;UE/LE Coordination activities OT Self Feeding Anticipated Outcome(s): I OT Basic Self-Care Anticipated Outcome(s): S OT Toileting Anticipated Outcome(s): S OT Bathroom Transfers Anticipated Outcome(s): S OT Recommendation Patient destination: Home Follow Up Recommendations: Home health OT Equipment Recommended: Tub/shower bench;3 in 1 bedside comode   Skilled Therapeutic Intervention Pt seen for initial evaluation and ADL retraining with a focus on activity tolerance and standing balance. Pt tolerated session well with several rest breaks. Sponge bath at EOB vs shower today as his activity tolerance was not strong enough for numerous transfers. Transferred to toilet with stand pivot with mod A to lower down to seat safely. Pt was able to stand up with min A. Dressed from EOB and  tolerated standing for very short periods of time. Discussed purpose of OT, projected LOS, goals, and reviewed safety to not get up by himself.  Pt resting in w/c at end of session with all needs met.  OT Evaluation Precautions/Restrictions  Precautions Precautions: Fall Precaution Comments: monitor BP Restrictions LLE Weight Bearing: Weight bearing as tolerated    Vital Signs  96-98% O2 sats with 2 L of O2 with seated activity Pain Pain Assessment Pain Assessment: No/denies pain Home Living/Prior Functioning Home Living Family/patient expects to be discharged to:: Private residence Living Arrangements: Spouse/significant other, Children Available Help at Discharge: Family, Available 24 hours/day Type of Home: Mobile home Home Access: Stairs to enter [employer] of Steps: 5-6 step entry Entrance Stairs-Rails: Right, Left Home Layout: One level Bathroom Shower/Tub: Tub/shower unit, Door, Other (comment) (will need to take doors out to use tub bench) Bathroom Toilet: Standard  Lives With: Spouse, Other (Comment) (grandson) IADL History Education: GED Prior Function Level of Independence: Independent with transfers, Independent with basic ADLs, Independent with gait  Able to Take Stairs?: Yes Driving: Yes (occassionally) Vocation: Retired Leisure: Hobbies-yes (Comment) Comments: woodworking ADL ADL ADL Comments: refer to functional navigatorrefer to functional navigator Vision/Perception  Vision- History Baseline Vision/History: No visual deficits Patient Visual Report: No change from baseline Vision- Assessment Vision Assessment?: No apparent visual deficits Perception Comments: WFL  Cognition Overall Cognitive Status: Impaired/Different from baseline (decreased processing) Arousal/Alertness: Awake/alert Orientation Level: Person;Place;Situation Person: Oriented Place: Oriented Situation: Oriented Year: Other (Comment) (1997) Month: November Day of  Week: Correct Memory: Impaired Memory Impairment: Retrieval deficit;Decreased recall of new information;Decreased short term memory Decreased Short Term Memory: Verbal basic Immediate Memory Recall:  Sock;Blue;Bed Memory Recall: Sock;Blue;Bed Memory Recall Sock: With Cue Memory Recall Blue: Without Cue Memory Recall Bed: With Cue Awareness: Appears intact Safety/Judgment: Appears intact Sensation Sensation Light Touch: Appears Intact Stereognosis: Appears Intact Hot/Cold: Appears Intact Proprioception: Appears Intact Coordination Gross Motor Movements are Fluid and Coordinated: No Fine Motor Movements are Fluid and Coordinated: Yes Coordination and Movement Description: deconditioned, generalized weakness Finger Nose Finger Test: 4x in 10 sec on B sides Motor  Motor Motor: Within Functional Limits Motor - Skilled Clinical Observations: deconditioning and generalized weakness Mobility    refer to functional navigator Trunk/Postural Assessment  Cervical Assessment Cervical Assessment: Within Functional Limits Thoracic Assessment Thoracic Assessment: Within Functional Limits Lumbar Assessment Lumbar Assessment: Exceptions to [facility] Postural Control Protective Responses: delayed  Balance Static Standing Balance Static Standing - Level of Assistance: 4: Min assist Dynamic Standing Balance Dynamic Standing - Level of Assistance: 3: Mod assist Extremity/Trunk Assessment RUE Assessment RUE Assessment: Exceptions to WFL (AROM WFL, 4-/5 strength) LUE Assessment LUE Assessment: Exceptions to [facility] (L shoulder AROM limited to 30* from old injury, distal strength 4-/5)   See Function Navigator for Current Functional Status.   Refer to Care Plan for Long Term Goals  Recommendations for other services: None  Discharge Criteria: Patient will be discharged from OT if patient refuses treatment 3 consecutive times without medical reason, if treatment goals not met, if there is a change  in medical status, if patient makes no progress towards goals or if patient is discharged from hospital.  The above assessment, treatment plan, treatment alternatives and goals were discussed and mutually agreed upon: by patient  [facility] 02/16/2016, 4:18 PM

## 2016-02-16 NOTE — Evaluation (Signed)
Physical Therapy Assessment and Plan  Patient Details  Name: Chad Rogers MRN: [id number] Date of Birth: 1935/12/31  PT Diagnosis: Abnormality of gait, Coordination disorder, Difficulty walking and Muscle weakness Rehab Potential: Good ELOS: 14-17 days   Today's Date: 02/16/2016 PT Individual Time: 0800-0857 PT Individual Time Calculation (min): 57 min     Problem List: Patient Active Problem List   Diagnosis Date Noted  . Debility 02/15/2016  . Atrial fibrillation, persistent ([place]) 01/15/2016  . Absent pulse in lower extremity   . Acute on chronic systolic CHF (congestive heart failure) ([place]) 01/01/2016  . NSVT (nonsustained ventricular tachycardia) ([place]) 01/01/2016  . Respiratory failure ([place])   . Traumatic fracture of ribs with pneumothorax 12/29/2015  . Multiple rib fractures 12/29/2015  . Cardiomyopathy ([place])   . Hypertension   . Pulmonary fibrosis ([place])   . Osteoarthritis   . Tobacco abuse     Past Medical History:  Past Medical History:  Diagnosis Date  . Alcohol abuse    family reports that [pt will drink at least 6 beers or more a day,   . Allergic rhinitis   . Cardiomyopathy [id number]   Acute systolic CHF at presentation; 05/2009 cardiac cath- 06/01/09  non obst coronary artery disease with EF 25%  . CHF (congestive heart failure) ([place])   . COPD (chronic obstructive pulmonary disease) ([place])   . Coronary artery disease   . Hypertension   . Osteoarthritis   . Psoriasis   . Pulmonary fibrosis ([place])    12/2004 with basilar fibrotic changes;  PFT's 06/07/09 FEV1 1.98 (68%) raio 53 no better after B2,  DLC0105%  . Skin cancer   . Subdural hygroma 03/2005   bilateral   . Tobacco abuse    100 pack years; quit   Past Surgical History:  Past Surgical History:  Procedure Laterality Date  . HAND SURGERY    . INGUINAL HERNIA REPAIR    . PERCUTANEOUS TRACHEOSTOMY N/A 01/10/2016   Procedure: PERCUTANEOUS TRACHEOSTOMY;  Surgeon: Judeth Horn, MD;  Location: [place];   Service: General;  Laterality: N/A;  . ROTATOR CUFF REPAIR      Assessment & Plan Clinical Impression: Chad Rogers is a 80 year old male with history of COPD, pulmonary fibrosis, CAD with ICM--EF 25% andCHF, ETOH abuse, HTN who slipped and fell on 12/28/15 with subsequent left 3 rd - 12th rib fractures, left PTX and left chest emphysema, small left pelvic ring/acetabular fracture, L3 and L4 transverse process fractures who developed hypotension with A fib and respiratory failure requiring intubation. Dr. Marcelino Scot evaluated patient and recommended WBAT and no surgical intervention as fracture felt to be due to Insufficiency. Cardiology consulted for management for CHF and for rate control. Medications adjusted and 2 D echo done revealing diffuse hypokineses with EF 25-30%. Rate controlled with amiodarone and CHF managed IV diuresis.  He did develop mottling/coolness of LLE and ABI done revealing severe BLE tibial disease. Dr. Bridgett Larsson recommended monitoring and transient changes felt to be due to decrease in cardiac output affecting perfusion. He had difficulty weaning off vent and tracheostomy performed 8/23. He was transferred to [facility] for vent wean and rehab. He tolerated extubation and was decanulated by 9/22. He was started on oxycodone for reports of diffuse pain. Diet advanced to regular textures, thin by 9/26. He is tolerating increase in activity with improved participation. Patient transferred to CIR on 02/15/2016.   Patient currently requires min with mobility secondary to muscle weakness and muscle joint tightness, decreased cardiorespiratoy  endurance and decreased oxygen support and decreased standing balance, decreased postural control and decreased balance strategies.  Prior to hospitalization, patient was independent  with mobility and lived with Spouse, Other (Comment) (grandson) in a Mobile home (single wide mobile home) home.  Home access is 5-6 step entryStairs to enter.  Patient will  benefit from skilled PT intervention to maximize safe functional mobility, minimize fall risk and decrease caregiver burden for planned discharge home with 24 hour supervision.  Anticipate patient will benefit from follow up [place] at discharge.  PT - End of Session Activity Tolerance: Tolerates < 10 min activity with changes in vital signs;Decreased this session Endurance Deficit: Yes Endurance Deficit Description: patient SOB sitting EOB, orthostatic with standing, fatigues quickly with minimal activity PT Assessment Rehab Potential (ACUTE/IP ONLY): Good Barriers to Discharge: [place] home environment PT Patient demonstrates impairments in the following area(s): Balance;Endurance;Motor;Nutrition;Pain PT Transfers Functional Problem(s): Bed Mobility;Bed to Chair;Car;Furniture;Floor PT Locomotion Functional Problem(s): Ambulation;Wheelchair Mobility;Stairs PT Plan PT Intensity: Minimum of 1-2 x/day ,45 to 90 minutes PT Frequency: 5 out of 7 days PT Duration Estimated Length of Stay: 14-17 days PT Treatment/Interventions: Ambulation/gait training;Balance/vestibular training;Discharge planning;Community reintegration;Disease management/prevention;DME/adaptive equipment instruction;Functional mobility training;Neuromuscular re-education;Pain management;Patient/family education;Psychosocial support;Stair training;Therapeutic Activities;Therapeutic Exercise;UE/LE Strength taining/ROM;UE/LE Coordination activities;Wheelchair propulsion/positioning PT Transfers Anticipated Outcome(s): mod I PT Locomotion Anticipated Outcome(s): supervision household PT Recommendation Follow Up Recommendations: Home health PT;24 hour supervision/assistance Patient destination: Home Equipment Recommended: To be determined  Skilled Therapeutic Intervention Skilled therapeutic intervention initiated after completion of evaluation. Discussed with patient falls risk, safety within room, and focus of therapy during stay.  Patient lying in bed with wet sheets, reporting he spilled urinal but did not want to "bother" anyone and nasal cannula removed. Donned nasal cannula and discussed importance of keeping skin clean and dry to prevent breakdown, verbalized understanding. Patient sat EOB for hygiene before donning underwear and clean gown with supervision/setup. Patient SOB sitting EOB with supervision. Performed stand pivot transfers, standing while using urinal, and gait x 15 ft without device with min A overall. Patient on 1 L O2 via [place] throughout session with Sp02 <96%. BP measured throughout session and abdominal binder donned for OOB: supine 104/61, after stand pivot transfer 98/52, sitting up in wheelchair 104/67, and seated after ambulation 98/55 with patient reporting mild symptoms of dizziness. Patient left sitting in wheelchair while NT changing sheets with NT in room.    PT Evaluation Precautions/Restrictions Precautions Precautions: Fall Precaution Comments: monitor BP Restrictions Weight Bearing Restrictions: Yes LLE Weight Bearing: Weight bearing as tolerated General Chart Reviewed: Yes Family/Caregiver Present: No Vital SignsTherapy Vitals Pulse Rate: 90 Resp: 18 BP: 111/75 Patient Position (if appropriate): Lying Oxygen Therapy SpO2: 98 % O2 Device: Nasal Cannula O2 Flow Rate (L/min): 1 L/min Pain Pain Assessment Pain Assessment: No/denies pain Home Living/Prior Functioning Home Living Available Help at Discharge: Family;Available 24 hours/day Type of Home: Mobile home (single wide mobile home) Home Access: Stairs to enter Entrance Stairs-Number of Steps: 5-6 step entry Entrance Stairs-Rails: Right;Left Home Layout: One level  Lives With: Spouse;Other (Comment) (grandson) Prior Function Level of Independence: Independent with transfers;Independent with basic ADLs;Independent with gait  Able to Take Stairs?: Yes Vocation: Retired Leisure: Hobbies-yes (Comment) Comments:  woodworking Vision/Perception   No change from baseline  Cognition Overall Cognitive Status: Impaired/Different from baseline Arousal/Alertness: Awake/alert Orientation Level: Oriented X4 Attention: Sustained Sustained Attention: Impaired Sustained Attention Impairment: Functional basic;Verbal basic Memory: Impaired Memory Impairment: Retrieval deficit;Decreased recall of new information Decreased Short Term Memory: Verbal basic Awareness:  Impaired Awareness Impairment: Emergent impairment Problem Solving: Impaired Problem Solving Impairment: Functional basic Executive Function: Organizing;Self Monitoring;Self Correcting Organizing: Impaired Organizing Impairment: Functional basic Self Monitoring: Impaired Self Monitoring Impairment: Functional basic Self Correcting: Impaired Self Correcting Impairment: Functional basic Safety/Judgment: Impaired Sensation Sensation Light Touch: Appears Intact Stereognosis: Appears Intact Hot/Cold: Appears Intact Proprioception: Appears Intact Coordination Gross Motor Movements are Fluid and Coordinated: No Fine Motor Movements are Fluid and Coordinated: Yes Coordination and Movement Description: deconditioned, generalized weakness Motor  Motor Motor: Within Functional Limits Motor - Skilled Clinical Observations: deconditioning and generalized weakness  Mobility Bed Mobility Bed Mobility: Supine to Sit Supine to Sit: 5: Supervision;With rails;HOB flat Transfers Transfers: Yes Stand Pivot Transfers: 4: Min assist;With armrests Locomotion  Ambulation Ambulation: Yes Ambulation/Gait Assistance: 4: Min assist Ambulation Distance (Feet): 15 Feet Assistive device: None Gait Gait: Yes Gait Pattern: Impaired Gait Pattern: Decreased stride length;Shuffle;Trunk flexed Gait velocity: decreased Stairs / Additional Locomotion Stairs: No (fatigue/low BP) Wheelchair Mobility Wheelchair Mobility: No (fatigue)  Trunk/Postural Assessment   Cervical Assessment Cervical Assessment: Within Functional Limits Thoracic Assessment Thoracic Assessment: Within Functional Limits Lumbar Assessment Lumbar Assessment: Exceptions to [facility] (posterior pelvic tilt) Postural Control Postural Control: Deficits on evaluation Protective Responses: delayed  Balance Balance Balance Assessed: Yes Static Standing Balance Static Standing - Balance Support: No upper extremity supported;During functional activity Static Standing - Level of Assistance: 4: Min assist Static Standing - Comment/# of Minutes: while using urinal Extremity Assessment  RUE Assessment RUE Assessment: Exceptions to WFL (AROM WFL, 4-/5 strength) LUE Assessment LUE Assessment: Exceptions to [facility] (L shoulder AROM limited to 30* from old injury, distal strength 4-/5) RLE Assessment RLE Assessment: Within Functional Limits LLE Assessment LLE Assessment: Within Functional Limits (grossly 4/5, painful)   See Function Navigator for Current Functional Status.   Refer to Care Plan for Long Term Goals  Recommendations for other services: None  Discharge Criteria: Patient will be discharged from PT if patient refuses treatment 3 consecutive times without medical reason, if treatment goals not met, if there is a change in medical status, if patient makes no progress towards goals or if patient is discharged from hospital.  The above assessment, treatment plan, treatment alternatives and goals were discussed and mutually agreed upon: by patient  Laretta Alstrom 02/16/2016, 9:46 AM

## 2016-02-16 NOTE — Progress Notes (Signed)
**  Preliminary report by tech**  Bilateral lower extremity venous duplex completed. There is no evidence of deep or superficial vein thrombosis involving the right and left lower extremities. All visualized vessels appear patent and compressible. There is no evidence of Baker's cysts bilaterally.  02/16/16 5:14 PM Chad Rogers RVT

## 2016-02-16 NOTE — Progress Notes (Signed)
Orthopedic Tech Progress Note Patient Details:  Chad Rogers [birth year] SQ:[id number]  Ortho Devices Type of Ortho Device: Abdominal binder Ortho Device/Splint Interventions: Application   [place] Pink 02/16/2016, 8:25 AM

## 2016-02-16 NOTE — Progress Notes (Signed)
80 year old male with history of COPD, pulmonary fibrosis, CAD with ICM--EF 25% andCHF, ETOH abuse, HTN who slipped and fell on 12/28/15 with subsequent left 3 rd - 12th rib fractures, left PTX and left chest emphysema, small left pelvic ring/acetabular fracture, L3 and L4 transverse process fractures who developed hypotension with A fib and respiratory failure requiring intubation. Dr. Marcelino Scot evaluated patient and recommended WBAT and no surgical intervention as fracture felt to be due to Insufficiency. Cardiology consulted for management for CHF and for rate control. Medications adjusted and 2 D echo done revealing diffuse hypokineses with EF 25-30%. Rate controlled with amiodarone and CHF managed IV diuresis.  Subjective/Complaints:   Objective: Vital Signs: Blood pressure 102/77, pulse 90, temperature 97.6 F (36.4 C), temperature source Oral, resp. rate 18, height _0  (1.803 m), weight 73.6 kg (162 lb 3.2 oz), SpO2 98 %. Dg Chest 2 View  Result Date: 02/15/2016 CLINICAL DATA:  Shortness of breath, hypertension.  Smoker. EXAM: CHEST  2 VIEW COMPARISON:  02/12/2016 FINDINGS: The heart size is normal. Aortic atherosclerosis noted. Persistent bilateral pleural effusions are identified right greater in left. There is atelectasis in low both lung bases. Suspect left lower lobe airspace opacity, best seen on the lateral radiograph. IMPRESSION: 1. Bilateral pleural effusions, right greater in left. 2. Bibasilar atelectasis 3. Suspect left lower lobe pneumonia. Electronically Signed   By: Kerby Moors M.D.   On: 02/15/2016 21:57   Results for orders placed or performed during the hospital encounter of 02/15/16 (from the past 72 hour(s))  MRSA PCR Screening     Status: None   Collection Time: 02/15/16  6:23 PM  Result Value Ref Range   MRSA by PCR NEGATIVE NEGATIVE    Comment:        The GeneXpert MRSA Assay (FDA approved for NASAL specimens only), is one component of a comprehensive MRSA  colonization surveillance program. It is not intended to diagnose MRSA infection nor to guide or monitor treatment for MRSA infections.   CBC WITH DIFFERENTIAL     Status: Abnormal   Collection Time: 02/16/16  5:45 AM  Result Value Ref Range   WBC 6.6 4.0 - 10.5 K/uL   RBC 2.78 (L) 4.22 - 5.81 MIL/uL   Hemoglobin 9.0 (L) 13.0 - 17.0 g/dL   HCT 28.0 (L) 39.0 - 52.0 %   MCV 100.7 (H) 78.0 - 100.0 fL   MCH 32.4 26.0 - 34.0 pg   MCHC 32.1 30.0 - 36.0 g/dL   RDW 16.6 (H) 11.5 - 15.5 %   Platelets 254 150 - 400 K/uL   Neutrophils Relative % 59 %   Neutro Abs 3.9 1.7 - 7.7 K/uL   Lymphocytes Relative 27 %   Lymphs Abs 1.8 0.7 - 4.0 K/uL   Monocytes Relative 11 %   Monocytes Absolute 0.7 0.1 - 1.0 K/uL   Eosinophils Relative 2 %   Eosinophils Absolute 0.2 0.0 - 0.7 K/uL   Basophils Relative 1 %   Basophils Absolute 0.0 0.0 - 0.1 K/uL  Comprehensive metabolic panel     Status: Abnormal   Collection Time: 02/16/16  5:45 AM  Result Value Ref Range   Sodium 135 135 - 145 mmol/L   Potassium 3.7 3.5 - 5.1 mmol/L   Chloride 98 (L) 101 - 111 mmol/L   CO2 30 22 - 32 mmol/L   Glucose, Bld 100 (H) 65 - 99 mg/dL   BUN 14 6 - 20 mg/dL   Creatinine, Ser 0.89  0.61 - 1.24 mg/dL   Calcium 9.1 8.9 - 10.3 mg/dL   Total Protein 6.0 (L) 6.5 - 8.1 g/dL   Albumin 2.8 (L) 3.5 - 5.0 g/dL   AST 17 15 - 41 U/L   ALT 23 17 - 63 U/L   Alkaline Phosphatase 88 38 - 126 U/L   Total Bilirubin 0.9 0.3 - 1.2 mg/dL   GFR calc non Af Amer >60 >60 mL/min   GFR calc Af Amer >60 >60 mL/min    Comment: (NOTE) The eGFR has been calculated using the CKD EPI equation. This calculation has not been validated in all clinical situations. eGFR's persistently <60 mL/min signify possible Chronic Kidney Disease.    Anion gap 7 5 - 15     HEENT: [place] in nares Cardio: RRR and no murmur Resp: CTA B/L and unlabored GI: BS positive and psoriatic plaques, face and forearms Extremity:  BS positive and NT, ND Skin:   Other  psoriatic plaques face and forearm Neuro: Alert/Oriented and Abnormal Motor 4/5 in BUE and BLE Musc/Skel:  Other restricted ROM B shoulder and hips and knees Gen NAD   Assessment/Plan: 1. Functional deficits secondary to Deconditioning multiple medical and Left acetabular fx which require 3+ hours per day of interdisciplinary therapy in a comprehensive inpatient rehab setting. Physiatrist is providing close team supervision and 24 hour management of active medical problems listed below. Physiatrist and rehab team continue to assess barriers to discharge/monitor patient progress toward functional and medical goals. FIM:                   Function - Comprehension Comprehension: Auditory  Function - Expression Expression: Verbal        Function - Memory Patient normally able to recall (first 3 days only): Staff names and faces, That he or she is in a hospital  Medical Problem List and Plan: 1.  Deconditioning and left acetabular fracture secondary to fall, resulting in multiple rib fractures and respiratory failure requiring mechanical ventilation and tracheostomy Initiate PT, OT 2.  DVT Prophylaxis/Anticoagulation: will add  Pharmaceutical: Lovenox--check dopplers in am.  3. Pain Management: Will try to minimize use of opiates. Given his history of alcohol abuse 4. Mood: LCSW to follow for evaluation and support.  5. Neuropsych: This patient is capable of making decisions on his own behalf. 6. Skin/Wound Care: Lubricating cream to eye lids. Steroid cream to psoriatic  plaques? 7. Fluids/Electrolytes/Nutrition: Strict I/O. Check lytes in am.  8. CAD with ICM/CHF: Look for signs of overload/Monitor weight daily. Low salt diet. Continue lasix with parameters--hold lisinopril.  9. A fib: Monitor HR bid. On amiodarone.  10. Hypotension:  BP ranges from  80/90 to 110. Monitor BP bid--will set parameters on lasix. Add TEDs 11. Anemia: due to chronic illness and/or Vit B 12  deficiency. Will check anemia panel--MCV 105.  12. Hepatic steatosis/ Alcohol abuse: LFTs/ platelets stable.  13. COPD/ Pulmonary fibrosis: Respiratory status compounded by multiple left rib fractures/left chest contusion. Continue duonebs, elliptra, and  Restrictive lung disease ask SLP to do resp muscle training   LOS (Days) 1 A FACE TO [place] E 02/16/2016, 8:30 AM

## 2016-02-16 NOTE — Evaluation (Signed)
Speech Language Pathology Assessment and Plan  Patient Details  Name: Chad Rogers MRN: [id number] Date of Birth: Nov 30, 1935  SLP Diagnosis: Cognitive Impairments  Rehab Potential: Good ELOS: 7-10 days     Today's Date: 02/16/2016 SLP Individual Time: 1400-1500 SLP Individual Time Calculation (min): 60 min    Problem List: Patient Active Problem List   Diagnosis Date Noted  . Debility 02/15/2016  . Atrial fibrillation, persistent ([place]) 01/15/2016  . Absent pulse in lower extremity   . Acute on chronic systolic CHF (congestive heart failure) ([place]) 01/01/2016  . NSVT (nonsustained ventricular tachycardia) ([place]) 01/01/2016  . Respiratory failure ([place])   . Traumatic fracture of ribs with pneumothorax 12/29/2015  . Multiple rib fractures 12/29/2015  . Cardiomyopathy ([place])   . Hypertension   . Pulmonary fibrosis ([place])   . Osteoarthritis   . Tobacco abuse    Past Medical History:  Past Medical History:  Diagnosis Date  . Alcohol abuse    family reports that [pt will drink at least 6 beers or more a day,   . Allergic rhinitis   . Cardiomyopathy [id number]   Acute systolic CHF at presentation; 05/2009 cardiac cath- 06/01/09  non obst coronary artery disease with EF 25%  . CHF (congestive heart failure) ([place])   . COPD (chronic obstructive pulmonary disease) ([place])   . Coronary artery disease   . Hypertension   . Osteoarthritis   . Psoriasis   . Pulmonary fibrosis ([place])    12/2004 with basilar fibrotic changes;  PFT's 06/07/09 FEV1 1.98 (68%) raio 53 no better after B2,  DLC0105%  . Skin cancer   . Subdural hygroma 03/2005   bilateral   . Tobacco abuse    100 pack years; quit   Past Surgical History:  Past Surgical History:  Procedure Laterality Date  . HAND SURGERY    . INGUINAL HERNIA REPAIR    . PERCUTANEOUS TRACHEOSTOMY N/A 01/10/2016   Procedure: PERCUTANEOUS TRACHEOSTOMY;  Surgeon: Judeth Horn, MD;  Location: [facility];  Service: General;  Laterality: N/A;  . ROTATOR  CUFF REPAIR      Assessment / Plan / Recommendation Clinical Impression   Chad Rogers is a 80 year old male with history of COPD, pulmonary fibrosis, CAD with ICM--EF 25% andCHF, ETOH abuse, HTN who slipped and fell on 12/28/15 with subsequent left 3 rd - 12th rib fractures, left PTX and left chest emphysema, small left pelvic ring/acetabular fracture, L3 and L4 transverse process fractures who developed hypotension with A fib and respiratory failure requiring intubation.  He had difficulty weaning off vent and tracheostomy performed 8/23. He was transferred to [facility] for vent wean and rehab. He tolerated extubation and was decanulated by 9/22. Diet advanced to regular textures, thin by 9/26. He is tolerating increase in activity with improved participation. CIR was recommended for follow up therapy. Pt admitted to CIR on 02/15/2016.  SLP evaluation completed on 02/16/2016 with the following results: Pt presents with moderately severe cognitive impairment characterized by decreased sustained attention to tasks, decreased functional problem solving, decreased emergent awareness of deficits, and decreased recall of new information.  Pt scored 24/30 on MoCA standardized cognitive assessment.  Pt was managing his medications (although reports poor compliance) and finances prior to admission.  As a result, pt would benefit from skilled ST while inpatient in order to maximize functional independence and reduce burden of care prior to discharge.     Skilled Therapeutic Interventions          Cognitive-linguistic  evaluation completed with results and recommendations reviewed with patient and family.  Pt required max assist to recognize and correct errors during the executive function portions of MoCA.  Processing of information was noted to be very delayed throughout all therapeutic tasks.  Discussed goals and anticipated discharge recommendations with patient and family who verbalized understanding.  All questions  were answered to their satisfaction at this time. Pt left in bed with call bell within reach and daughter at bedside.       SLP Assessment  Patient will need skilled [facility] during CIR admission    Recommendations  Recommendations for Other Services: Neuropsych consult Patient destination: Home Follow up Recommendations: Home Health SLP;Outpatient SLP;24 hour supervision/assistance Equipment Recommended: None recommended by SLP    SLP Frequency 3 to 5 out of 7 days   SLP Duration  SLP Intensity  SLP Treatment/Interventions 7-10 days   Minumum of 1-2 x/day, 30 to 90 minutes  Cognitive remediation/compensation;Cueing hierarchy;Internal/external aids;Functional tasks;Patient/family education    Pain Pain Assessment Pain Assessment: No/denies pain  Prior Functioning Cognitive/Linguistic Baseline: Within functional limits Type of Home: Mobile home  Lives With: Spouse;Other (Comment) (grandson) Available Help at Discharge: Family;Available 24 hours/day Education: GED Vocation: Retired  Function:  Eating Eating               Cognition Comprehension Comprehension assist level: Understands basic 75 - 89% of the time/ requires cueing 10 - 24% of the time  Expression   Expression assist level: Expresses basic 90% of the time/requires cueing < 10% of the time.  Social Interaction Social Interaction assist level: Interacts appropriately 75 - 89% of the time - Needs redirection for appropriate language or to initiate interaction.  Problem Solving Problem solving assist level: Solves basic 50 - 74% of the time/requires cueing 25 - 49% of the time  Memory Memory assist level: Recognizes or recalls 50 - 74% of the time/requires cueing 25 - 49% of the time   Short Term Goals: Week 1: SLP Short Term Goal 1 (Week 1): STG=LTG due to ELOS   Refer to Care Plan for Long Term Goals  Recommendations for other services: None  Discharge Criteria: Patient  will be discharged from SLP if patient refuses treatment 3 consecutive times without medical reason, if treatment goals not met, if there is a change in medical status, if patient makes no progress towards goals or if patient is discharged from hospital.  The above assessment, treatment plan, treatment alternatives and goals were discussed and mutually agreed upon: by patient and by family  Claritza July, [name] 02/16/2016, 4:21 PM

## 2016-02-16 NOTE — Progress Notes (Signed)
Occupational Therapy Session Note  Patient Details  Name: Chad Rogers MRN: [id number] Date of Birth: 04/27/1936  Today's Date: 02/16/2016  Patient missed 30 minutes scheduled OT as he was out of his room for procedure/testing per family report upon approach for afternoon OT   Herschell Dimes 02/16/2016, 6:32 PM

## 2016-02-17 ENCOUNTER — Inpatient Hospital Stay (HOSPITAL_COMMUNITY): Payer: Medicare Other | Admitting: Occupational Therapy

## 2016-02-17 ENCOUNTER — Inpatient Hospital Stay (HOSPITAL_COMMUNITY): Payer: Medicare Other | Admitting: Physical Therapy

## 2016-02-17 NOTE — Progress Notes (Signed)
80 year old male with history of COPD, pulmonary fibrosis, CAD with ICM--EF 25% andCHF, ETOH abuse, HTN who slipped and fell on 12/28/15 with subsequent left 3 rd - 12th rib fractures, left PTX and left chest emphysema, small left pelvic ring/acetabular fracture, L3 and L4 transverse process fractures who developed hypotension with A fib and respiratory failure requiring intubation. Dr. Marcelino Scot evaluated patient and recommended WBAT and no surgical intervention as fracture felt to be due to Insufficiency. Cardiology consulted for management for CHF and for rate control. Medications adjusted and 2 D echo done revealing diffuse hypokineses with EF 25-30%. Rate controlled with amiodarone and CHF managed IV diuresis.  Subjective/Complaints: Slept well. Still feels weak. No specific complaints  ROS: Pt denies fever, rash/itching, headache, blurred or double vision, nausea, vomiting, abdominal pain, diarrhea, chest pain, shortness of breath, palpitations, dysuria, dizziness, neck or back pain, bleeding, anxiety, or depression  Objective: Vital Signs: Blood pressure 102/68, pulse 88, temperature 97.7 F (36.5 C), temperature source Oral, resp. rate 18, height 5' 11"  (1.803 m), weight 73.6 kg (162 lb 3.2 oz), SpO2 98 %. Dg Chest 2 View  Result Date: 02/15/2016 CLINICAL DATA:  Shortness of breath, hypertension.  Smoker. EXAM: CHEST  2 VIEW COMPARISON:  02/12/2016 FINDINGS: The heart size is normal. Aortic atherosclerosis noted. Persistent bilateral pleural effusions are identified right greater in left. There is atelectasis in low both lung bases. Suspect left lower lobe airspace opacity, best seen on the lateral radiograph. IMPRESSION: 1. Bilateral pleural effusions, right greater in left. 2. Bibasilar atelectasis 3. Suspect left lower lobe pneumonia. Electronically Signed   By: Kerby Moors M.D.   On: 02/15/2016 21:57   Results for orders placed or performed during the hospital encounter of 02/15/16  (from the past 72 hour(s))  MRSA PCR Screening     Status: None   Collection Time: 02/15/16  6:23 PM  Result Value Ref Range   MRSA by PCR NEGATIVE NEGATIVE    Comment:        The GeneXpert MRSA Assay (FDA approved for NASAL specimens only), is one component of a comprehensive MRSA colonization surveillance program. It is not intended to diagnose MRSA infection nor to guide or monitor treatment for MRSA infections.   CBC WITH DIFFERENTIAL     Status: Abnormal   Collection Time: 02/16/16  5:45 AM  Result Value Ref Range   WBC 6.6 4.0 - 10.5 K/uL   RBC 2.78 (L) 4.22 - 5.81 MIL/uL   Hemoglobin 9.0 (L) 13.0 - 17.0 g/dL   HCT 28.0 (L) 39.0 - 52.0 %   MCV 100.7 (H) 78.0 - 100.0 fL   MCH 32.4 26.0 - 34.0 pg   MCHC 32.1 30.0 - 36.0 g/dL   RDW 16.6 (H) 11.5 - 15.5 %   Platelets 254 150 - 400 K/uL   Neutrophils Relative % 59 %   Neutro Abs 3.9 1.7 - 7.7 K/uL   Lymphocytes Relative 27 %   Lymphs Abs 1.8 0.7 - 4.0 K/uL   Monocytes Relative 11 %   Monocytes Absolute 0.7 0.1 - 1.0 K/uL   Eosinophils Relative 2 %   Eosinophils Absolute 0.2 0.0 - 0.7 K/uL   Basophils Relative 1 %   Basophils Absolute 0.0 0.0 - 0.1 K/uL  Comprehensive metabolic panel     Status: Abnormal   Collection Time: 02/16/16  5:45 AM  Result Value Ref Range   Sodium 135 135 - 145 mmol/L   Potassium 3.7 3.5 - 5.1 mmol/L  Chloride 98 (L) 101 - 111 mmol/L   CO2 30 22 - 32 mmol/L   Glucose, Bld 100 (H) 65 - 99 mg/dL   BUN 14 6 - 20 mg/dL   Creatinine, Ser 0.89 0.61 - 1.24 mg/dL   Calcium 9.1 8.9 - 10.3 mg/dL   Total Protein 6.0 (L) 6.5 - 8.1 g/dL   Albumin 2.8 (L) 3.5 - 5.0 g/dL   AST 17 15 - 41 U/L   ALT 23 17 - 63 U/L   Alkaline Phosphatase 88 38 - 126 U/L   Total Bilirubin 0.9 0.3 - 1.2 mg/dL   GFR calc non Af Amer >60 >60 mL/min   GFR calc Af Amer >60 >60 mL/min    Comment: (NOTE) The eGFR has been calculated using the CKD EPI equation. This calculation has not been validated in all clinical  situations. eGFR's persistently <60 mL/min signify possible Chronic Kidney Disease.    Anion gap 7 5 - 15     HEENT: [place] in nares Cardio: RRR and no murmur Resp: CTA B/L and unlabored GI: BS positive and psoriatic plaques, face and forearms Extremity:  BS positive and NT, ND Skin:   Other psoriatic plaques face and forearm Neuro: Alert/Oriented and Abnormal Motor 4/5 in BUE and BLE Musc/Skel:  Other restricted ROM B shoulder and hips and knees Gen NAD, frail appearing.    Assessment/Plan: 1. Functional deficits secondary to Deconditioning multiple medical and Left acetabular fx which require 3+ hours per day of interdisciplinary therapy in a comprehensive inpatient rehab setting. Physiatrist is providing close team supervision and 24 hour management of active medical problems listed below. Physiatrist and rehab team continue to assess barriers to discharge/monitor patient progress toward functional and medical goals. FIM: Function - Bathing Position: Sitting EOB Body parts bathed by patient: Right arm, Left arm, Chest, Abdomen, Front perineal area, Right upper leg, Left upper leg, Right lower leg, Left lower leg Body parts bathed by helper: Back, Buttocks Assist Level: Touching or steadying assistance(Pt > 75%)  Function- Upper Body Dressing/Undressing What is the patient wearing?: Pull over shirt/dress Pull over shirt/dress - Perfomed by patient: Thread/unthread right sleeve, Thread/unthread left sleeve, Put head through opening, Pull shirt over trunk Assist Level: Supervision or verbal cues Function - Lower Body Dressing/Undressing What is the patient wearing?: Pants, Ted Hose, Shoes Position: Sitting EOB Pants- Performed by patient: Pull pants up/down Pants- Performed by helper: Thread/unthread right pants leg, Thread/unthread left pants leg Shoes - Performed by helper: Don/doff right shoe, Don/doff left shoe, Fasten right, Fasten left TED Hose - Performed by helper: Don/doff  right TED hose, Don/doff left TED hose  Function - Toileting Toileting steps completed by patient: Adjust clothing prior to toileting, Performs perineal hygiene, Adjust clothing after toileting Toileting Assistive Devices: Toilet aid Assist level: Set up/obtain supplies  Function - Air cabin crew transfer assistive device: Grab bar Assist level to toilet: Moderate assist (Pt 50 - 74%/lift or lower) Assist level from toilet: Moderate assist (Pt 50 - 74%/lift or lower)  Function - Chair/bed transfer Chair/bed transfer method: Stand pivot Chair/bed transfer assist level: Touching or steadying assistance (Pt > 75%) Chair/bed transfer assistive device: Armrests  Function - Locomotion: Wheelchair Will patient use wheelchair at discharge?:  (TBD) Function - Locomotion: Ambulation Assistive device: No device Max distance: 15 ft Assist level: Touching or steadying assistance (Pt > 75%) Assist level: Touching or steadying assistance (Pt > 75%) Walk 50 feet with 2 turns activity did not occur: Safety/medical concerns (fatigue/low  BP) Walk 150 feet activity did not occur: Safety/medical concerns Walk 10 feet on uneven surfaces activity did not occur: Safety/medical concerns  Function - Comprehension Comprehension: Auditory Comprehension assist level: Understands basic 75 - 89% of the time/ requires cueing 10 - 24% of the time  Function - Expression Expression: Verbal Expression assist level: Expresses basic 90% of the time/requires cueing < 10% of the time.  Function - Social Interaction Social Interaction assist level: Interacts appropriately 75 - 89% of the time - Needs redirection for appropriate language or to initiate interaction.  Function - Problem Solving Problem solving assist level: Solves basic 50 - 74% of the time/requires cueing 25 - 49% of the time  Function - Memory Memory assist level: Recognizes or recalls 50 - 74% of the time/requires cueing 25 - 49% of the  time Patient normally able to recall (first 3 days only): Staff names and faces, That he or she is in a hospital  Medical Problem List and Plan: 1.  Deconditioning and left acetabular fracture secondary to fall, resulting in multiple rib fractures and respiratory failure requiring mechanical ventilation and tracheostomy  -continue therapies  -encouraged him to be OOB during down time this weekend 2.  DVT Prophylaxis/Anticoagulation: will add  Pharmaceutical: Lovenox--check dopplers in am.  3. Pain Management: try to minimize use of opiates. Given his history of alcohol abuse 4. Mood: LCSW to follow for evaluation and support.  5. Neuropsych: This patient is capable of making decisions on his own behalf. 6. Skin/Wound Care: Lubricating cream to eye lids. Steroid cream to psoriatic  plaques? 7. Fluids/Electrolytes/Nutrition: Strict I/O. Check lytes in am.  8. CAD with ICM/CHF: Look for signs of overload/Monitor weight daily. Low salt diet. Continue lasix with parameters--hold lisinopril.  9. A fib: Monitor HR bid. On amiodarone.  10. Hypotension:  BP ranges from  80/90 to 110. Monitor BP bid--will set parameters on lasix. Add TEDs 11. Anemia: due to chronic illness and/or Vit B 12 deficiency.  anemia panel--MCV 105.  12. Hepatic steatosis/ Alcohol abuse: LFTs/ platelets stable.  13. COPD/ Pulmonary fibrosis: Respiratory status compounded by multiple left rib fractures/left chest contusion. Continue duonebs, elliptra, and  Restrictive lung disease SLP for resp muscle training   LOS (Days) 2 A FACE TO FACE EVALUATION WAS PERFORMED  SWARTZ,ZACHARY T 02/17/2016, 10:48 AM

## 2016-02-17 NOTE — IPOC Note (Signed)
Overall Plan of Care [facility]) Patient Details Name: Chad Rogers MRN: [id number] DOB: 13-Sep-1935  Admitting Diagnosis: VDRF  Hospital Problems: Active Problems:   Debility     Functional Problem List: Nursing Bowel, Endurance, Medication Management, Motor, Skin Integrity  PT Balance, Endurance, Motor, Nutrition, Pain  OT Balance, Cognition, Endurance, Motor  SLP Cognition  TR         Basic ADL's: OT Bathing, Dressing, Toileting     Advanced  ADL's: OT       Transfers: PT Bed Mobility, Bed to Chair, Car, Furniture, Floor  OT Toilet, Metallurgist: PT Ambulation, Emergency planning/management officer, Stairs     Additional Impairments: OT None  SLP Social Cognition   Problem Solving, Memory, Attention, Awareness  TR      Anticipated Outcomes Item Anticipated Outcome  Self Feeding I  Swallowing      Basic self-care  S  Toileting  S   Bathroom Transfers S  Bowel/Bladder  Mod I assist  Transfers  mod I  Locomotion  supervision household  Communication     Cognition  Supervision   Pain  < 3  Safety/Judgment  Mod I    Therapy Plan: PT Intensity: Minimum of 1-2 x/day ,45 to 90 minutes PT Frequency: 5 out of 7 days PT Duration Estimated Length of Stay: 14-17 days OT Intensity: Minimum of 1-2 x/day, 45 to 90 minutes OT Frequency: 5 out of 7 days OT Duration/Estimated Length of Stay: 14-17 days SLP Intensity: Minumum of 1-2 x/day, 30 to 90 minutes SLP Frequency: 3 to 5 out of 7 days SLP Duration/Estimated Length of Stay: 7-10 days        Team Interventions: Nursing Interventions Patient/Family Education, Bowel Management, Medication Management, Dysphagia/Aspiration Precaution Training, Skin Care/Wound Management, Disease Management/Prevention  PT interventions Ambulation/gait training, Training and development officer, Discharge planning, Community reintegration, Disease management/prevention, DME/adaptive equipment instruction, Functional mobility  training, Neuromuscular re-education, Pain management, Patient/family education, Psychosocial support, Stair training, Therapeutic Activities, Therapeutic Exercise, UE/LE Strength taining/ROM, UE/LE Coordination activities, Wheelchair propulsion/positioning  OT Interventions Training and development officer, Cognitive remediation/compensation, DME/adaptive equipment instruction, Discharge planning, Functional mobility training, Patient/family education, Self Care/advanced ADL retraining, UE/LE Strength taining/ROM, Therapeutic Exercise, Therapeutic Activities, UE/LE Coordination activities  SLP Interventions Cognitive remediation/compensation, Cueing hierarchy, Internal/external aids, Functional tasks, Patient/family education  TR Interventions    SW/CM Interventions Discharge Planning, Psychosocial Support, Patient/Family Education    Team Discharge Planning: Destination: PT-Home ,OT- Home , SLP-Home Projected Follow-up: PT-Home health PT, 24 hour supervision/assistance, OT-  Home health OT, SLP-Home Health SLP, Outpatient SLP, 24 hour supervision/assistance Projected Equipment Needs: PT-To be determined, OT- Tub/shower bench, 3 in 1 bedside comode, SLP-None recommended by SLP Equipment Details: PT- , OT-  Patient/family involved in discharge planning: PT- Patient,  OT-Patient, SLP-Patient, Family member/caregiver  MD ELOS: 14-17 days Medical Rehab Prognosis:  Excellent Assessment: The patient has been admitted for CIR therapies with the diagnosis of debility after VDRF and multiple medical issues. The team will be addressing functional mobility, strength, stamina, balance, safety, adaptive techniques and equipment, self-care, bowel and bladder mgt, patient and caregiver education, communication, breathing, activity tolerance, ego support, community reintegration. Goals have been set at supervision to Chad Lope, MD, [facility]      See Team Conference Notes for weekly updates to the  plan of care

## 2016-02-18 ENCOUNTER — Inpatient Hospital Stay (HOSPITAL_COMMUNITY): Payer: Medicare Other

## 2016-02-18 ENCOUNTER — Inpatient Hospital Stay (HOSPITAL_COMMUNITY): Payer: Medicare Other | Admitting: Physical Therapy

## 2016-02-18 DIAGNOSIS — I5033 Acute on chronic diastolic (congestive) heart failure: Secondary | ICD-10-CM

## 2016-02-18 DIAGNOSIS — L409 Psoriasis, unspecified: Secondary | ICD-10-CM

## 2016-02-18 NOTE — Progress Notes (Signed)
Occupational Therapy Session Note  Patient Details  Name: Chad Rogers MRN: [id number] Date of Birth: Nov 21, 1935  Today's Date: 02/18/2016 OT Individual Time: 1000-1130 OT Individual Time Calculation (min): 90 min   Short Term Goals: Week 1:  OT Short Term Goal 1 (Week 1): Pt will be able to ambulate to toilet with LRD with min A. OT Short Term Goal 2 (Week 1): Pt will demonstrate improved activity tolerance to stand at sink for 1 min to wash face, brush hair. OT Short Term Goal 3 (Week 1): Pt will don pants with steadying A when standing. OT Short Term Goal 4 (Week 1): Pt will demonstrate improved activity tolerance to tolerate a shower in sitting.  Skilled Therapeutic Interventions/Progress Updates:   ADL-retraining at shower level with focus on improved activity tolerance, transfers, energy conservation, DME training, and safety awareness and improved awareness relating to skin care.   Pt received seated in w/c with family present.   Pt initially refused shower level BADL but accepted persuasion from OT and family to attempt showering for better skin care.   After setup to provide portable 02, pt completed transfer to toilet from w/c with mod assist to lift only (pt = 80%).   Pt requested extra time to void but was unsuccessful with attempted BM and returned to w/c to transfer to bench.   Pt again required only lifting assist to transfer to bench but required mod instructional cues to problem-solve and sequence through bathing d/t distraction with features of shower and need for assist with managing nasal canula.   Pt rested after bathing and returned to w/c to dress in bathroom d/t poor thermoregulation.   With extra time and instructional cues pt donned underwear, pants and shirt with overall min assist.   OT applied lotion to pt's back and legs as per RN direction after bathing.    Pt elected to remain seated in w/c at end of session as family retruned to assist and attend to his needs  during visit.   Therapy Documentation Precautions:  Precautions Precautions: Fall Precaution Comments: monitor BP Restrictions Weight Bearing Restrictions: Yes LLE Weight Bearing: Weight bearing as tolerated  Vital Signs: Therapy Vitals BP: (!) 101/51 Patient Position (if appropriate): Sitting Oxygen Therapy SpO2: 97 % O2 Device: Nasal Cannula O2 Flow Rate (L/min): 2 L/min  ADL: ADL ADL Comments: refer to functional navigator  See Function Navigator for Current Functional Status.   Therapy/Group: Individual Therapy   Second session: Time:  1445-1500 Time Calculation (min): 15   min  Pain Assessment:  No/denies pain  Skilled Therapeutic Interventions: ADL-retraining with focus on transfer (w/c to bed), lower body skin care, effective use of DME (suction and bed controls).   Pt received seated in w/c requesting return to bed d/t fatigue.  After setup to remove TEDs, pt required min instructional cues and steadying assist to transfer from w/c to bed.   After setup to lower bed rails, pt progressed to head of bed using lateral scoot and performed bed mobility unassisted.   Pt requested assist to don sock and apply lotion to both feet and was re-educated on benefit of head elevation to improve respirations and on use of suction to remove oral secretions and phlegm.    See FIM for current functional status  Therapy/Group: Individual Therapy  Ruchi Stoney 02/18/2016, 12:42 PM

## 2016-02-18 NOTE — Progress Notes (Signed)
Physical Therapy Session Note  Patient Details  Name: Chad Rogers MRN: [id number] Date of Birth: [birth year]  Today's Date: 02/18/2016 PT Individual Time: 0800-0900 PT Individual Time Calculation (min): 60 min    Short Term Goals: Week 1:  PT Short Term Goal 1 (Week 1): Patient will perform stand pivot transfers with close supervision. PT Short Term Goal 2 (Week 1): Patient will ambulate 30 ft with min A.  PT Short Term Goal 3 (Week 1): Patient will initiate stair training. PT Short Term Goal 4 (Week 1): Patient will propel wheelchair 150 ft using BUE for strengthening and endurance.   Skilled Therapeutic Interventions/Progress Updates:    Pt was seen bedside in the am. Pt willing to participate with therapy. Pt's BP in supine 104/64 with HR 93. Pt transferred supine to edge of bed with side rail, head of bed elevated and S with increased time. Pt's BP at edge of bed 94/53 with HR 103. Pt c/o mild dizziness with subsided after about 60 seconds. Pt transferred edge of bed to w/c with min A and verbal cues. Pt's BP sitting in w/c was 90/46, with c/o mild dizziness which subsided after about 60 seconds. Retook BP seated in w/c 91/53 with no c/o dizziness. Pt propelled w/c about 25 feet with B LEs and min A with verbal cues. Pt performed multiple sit to stand transfers with min A and verbal cues. Pt picked object up off the floor with mod A and verbal cues. Pt performed car transfers with min to mod A and verbal cues. Pt ascended/descended 1 stair with B rails and mod A with verbal cues. Once pt up in w/c no further c/o dizziness with activity. Pt did require multiple rest breaks secondary to respiratory status. Following treatment, pt returned to room and left sitting up in w/c with call bell within reach.   Therapy Documentation Precautions:  Precautions Precautions: Fall Precaution Comments: monitor BP Restrictions Weight Bearing Restrictions: Yes LLE Weight Bearing: Weight bearing as  tolerated General:   Vital Signs: Oxygen Therapy SpO2: 97 % O2 Device: Nasal Cannula O2 Flow Rate (L/min): 2 L/min Pain: Pt c/o mild L sided rib pain.   See Function Navigator for Current Functional Status.   Therapy/Group: Individual Therapy  Laymond, Tayman 02/18/2016, 12:40 PM

## 2016-02-18 NOTE — Progress Notes (Signed)
80 year old male with history of COPD, pulmonary fibrosis, CAD with ICM--EF 25% andCHF, ETOH abuse, HTN who slipped and fell on 12/28/15 with subsequent left 3 rd - 12th rib fractures, left PTX and left chest emphysema, small left pelvic ring/acetabular fracture, L3 and L4 transverse process fractures who developed hypotension with A fib and respiratory failure requiring intubation. Dr. Marcelino Scot evaluated patient and recommended WBAT and no surgical intervention as fracture felt to be due to Insufficiency. Cardiology consulted for management for CHF and for rate control. Medications adjusted and 2 D echo done revealing diffuse hypokineses with EF 25-30%. Rate controlled with amiodarone and CHF managed IV diuresis.  Subjective/Complaints: No new complaints. A little anxious about all therapy he has today. Doesn't have great appetite but knows he needs to eat.  ROS: Pt denies fever, rash/itching, headache, blurred or double vision, nausea, vomiting, abdominal pain, diarrhea, chest pain, shortness of breath, palpitations, dysuria, dizziness, neck or back pain, bleeding, anxiety, or depression  Objective: Vital Signs: Blood pressure (!) 101/51, pulse 100, temperature 97.8 F (36.6 C), temperature source Oral, resp. rate 16, height _0  (1.803 m), weight 67.4 kg (148 lb 8 oz), SpO2 97 %. No results found. Results for orders placed or performed during the hospital encounter of 02/15/16 (from the past 72 hour(s))  MRSA PCR Screening     Status: None   Collection Time: 02/15/16  6:23 PM  Result Value Ref Range   MRSA by PCR NEGATIVE NEGATIVE    Comment:        The GeneXpert MRSA Assay (FDA approved for NASAL specimens only), is one component of a comprehensive MRSA colonization surveillance program. It is not intended to diagnose MRSA infection nor to guide or monitor treatment for MRSA infections.   CBC WITH DIFFERENTIAL     Status: Abnormal   Collection Time: 02/16/16  5:45 AM  Result  Value Ref Range   WBC 6.6 4.0 - 10.5 K/uL   RBC 2.78 (L) 4.22 - 5.81 MIL/uL   Hemoglobin 9.0 (L) 13.0 - 17.0 g/dL   HCT 28.0 (L) 39.0 - 52.0 %   MCV 100.7 (H) 78.0 - 100.0 fL   MCH 32.4 26.0 - 34.0 pg   MCHC 32.1 30.0 - 36.0 g/dL   RDW 16.6 (H) 11.5 - 15.5 %   Platelets 254 150 - 400 K/uL   Neutrophils Relative % 59 %   Neutro Abs 3.9 1.7 - 7.7 K/uL   Lymphocytes Relative 27 %   Lymphs Abs 1.8 0.7 - 4.0 K/uL   Monocytes Relative 11 %   Monocytes Absolute 0.7 0.1 - 1.0 K/uL   Eosinophils Relative 2 %   Eosinophils Absolute 0.2 0.0 - 0.7 K/uL   Basophils Relative 1 %   Basophils Absolute 0.0 0.0 - 0.1 K/uL  Comprehensive metabolic panel     Status: Abnormal   Collection Time: 02/16/16  5:45 AM  Result Value Ref Range   Sodium 135 135 - 145 mmol/L   Potassium 3.7 3.5 - 5.1 mmol/L   Chloride 98 (L) 101 - 111 mmol/L   CO2 30 22 - 32 mmol/L   Glucose, Bld 100 (H) 65 - 99 mg/dL   BUN 14 6 - 20 mg/dL   Creatinine, Ser 0.89 0.61 - 1.24 mg/dL   Calcium 9.1 8.9 - 10.3 mg/dL   Total Protein 6.0 (L) 6.5 - 8.1 g/dL   Albumin 2.8 (L) 3.5 - 5.0 g/dL   AST 17 15 - 41 U/L  ALT 23 17 - 63 U/L   Alkaline Phosphatase 88 38 - 126 U/L   Total Bilirubin 0.9 0.3 - 1.2 mg/dL   GFR calc non Af Amer >60 >60 mL/min   GFR calc Af Amer >60 >60 mL/min    Comment: (NOTE) The eGFR has been calculated using the CKD EPI equation. This calculation has not been validated in all clinical situations. eGFR's persistently <60 mL/min signify possible Chronic Kidney Disease.    Anion gap 7 5 - 15     HEENT: [place] in nares Cardio: RRR and no murmur Resp: CTA B/L and unlabored. Oxygen via [place] GI: BS positive and psoriatic plaques, face and forearms Extremity:  BS positive and NT, ND Skin:   Other psoriatic plaques face and forearm Neuro: Alert/Oriented and Abnormal Motor 4/5 in BUE and BLE Musc/Skel:  Other restricted ROM B shoulder and hips and knees Gen NAD, frail appearing.    Assessment/Plan: 1.  Functional deficits secondary to Deconditioning multiple medical and Left acetabular fx which require 3+ hours per day of interdisciplinary therapy in a comprehensive inpatient rehab setting. Physiatrist is providing close team supervision and 24 hour management of active medical problems listed below. Physiatrist and rehab team continue to assess barriers to discharge/monitor patient progress toward functional and medical goals. FIM: Function - Bathing Position: Sitting EOB Body parts bathed by patient: Right arm, Left arm, Chest, Abdomen, Front perineal area, Right upper leg, Left upper leg, Right lower leg, Left lower leg Body parts bathed by helper: Back, Buttocks Assist Level: Touching or steadying assistance(Pt > 75%)  Function- Upper Body Dressing/Undressing What is the patient wearing?: Pull over shirt/dress Pull over shirt/dress - Perfomed by patient: Thread/unthread right sleeve, Thread/unthread left sleeve, Put head through opening, Pull shirt over trunk Assist Level: Supervision or verbal cues Function - Lower Body Dressing/Undressing What is the patient wearing?: Pants, Ted Hose, Shoes Position: Sitting EOB Pants- Performed by patient: Pull pants up/down Pants- Performed by helper: Thread/unthread right pants leg, Thread/unthread left pants leg Shoes - Performed by helper: Don/doff right shoe, Don/doff left shoe, Fasten right, Fasten left TED Hose - Performed by helper: Don/doff right TED hose, Don/doff left TED hose  Function - Toileting Toileting steps completed by patient: Adjust clothing prior to toileting, Performs perineal hygiene, Adjust clothing after toileting Toileting Assistive Devices: Toilet aid Assist level: Supervision or verbal cues  Function - Air cabin crew transfer assistive device: Grab bar Assist level to toilet: Moderate assist (Pt 50 - 74%/lift or lower) Assist level from toilet: Moderate assist (Pt 50 - 74%/lift or lower)  Function -  Chair/bed transfer Chair/bed transfer method: Stand pivot Chair/bed transfer assist level: Touching or steadying assistance (Pt > 75%) Chair/bed transfer assistive device: Armrests  Function - Locomotion: Wheelchair Will patient use wheelchair at discharge?:  (TBD) Function - Locomotion: Ambulation Assistive device: No device Max distance: 15 ft Assist level: Touching or steadying assistance (Pt > 75%) Assist level: Touching or steadying assistance (Pt > 75%) Walk 50 feet with 2 turns activity did not occur: Safety/medical concerns (fatigue/low BP) Walk 150 feet activity did not occur: Safety/medical concerns Walk 10 feet on uneven surfaces activity did not occur: Safety/medical concerns  Function - Comprehension Comprehension: Auditory Comprehension assist level: Understands basic 75 - 89% of the time/ requires cueing 10 - 24% of the time  Function - Expression Expression: Verbal Expression assist level: Expresses basic 90% of the time/requires cueing < 10% of the time.  Function - Social Interaction Social Interaction  assist level: Interacts appropriately 75 - 89% of the time - Needs redirection for appropriate language or to initiate interaction.  Function - Problem Solving Problem solving assist level: Solves basic 50 - 74% of the time/requires cueing 25 - 49% of the time  Function - Memory Memory assist level: Recognizes or recalls 50 - 74% of the time/requires cueing 25 - 49% of the time Patient normally able to recall (first 3 days only): Staff names and faces, That he or she is in a hospital  Medical Problem List and Plan: 1.  Deconditioning and left acetabular fracture secondary to fall, resulting in multiple rib fractures and respiratory failure requiring mechanical ventilation and tracheostomy  -continue therapies 2.  DVT Prophylaxis/Anticoagulation: will add  Pharmaceutical: Lovenox--dopplers negative  3. Pain Management: try to minimize use of opiates. Given his  history of alcohol abuse 4. Mood: LCSW to follow for evaluation and support.  5. Neuropsych: This patient is capable of making decisions on his own behalf. 6. Skin/Wound Care: Lubricating cream to eye lids. Steroid cream to psoriatic  plaques? 7. Fluids/Electrolytes/Nutrition: Strict I/O. Encourage po  8. CAD with ICM/CHF: Look for signs of overload  -. Low salt diet. Continue lasix with parameters--hold lisinopril.  -weight 67kg today  9. A fib: Monitor HR bid. On amiodarone.  10. Hypotension:  BP ranges from  80/90 to 110. Monitor BP bid--will set parameters on lasix. Add TEDs 11. Anemia: due to chronic illness and/or Vit B 12 deficiency.  anemia panel--MCV 105.  12. Hepatic steatosis/ Alcohol abuse: LFTs/ platelets stable.  13. COPD/ Pulmonary fibrosis: Respiratory status compounded by multiple left rib fractures/left chest contusion. Continue duonebs, elliptra, and  Restrictive lung disease SLP for resp muscle training   LOS (Days) 3 A FACE TO FACE EVALUATION WAS PERFORMED  SWARTZ,ZACHARY T 02/18/2016, 10:09 AM

## 2016-02-18 NOTE — Progress Notes (Signed)
Physical Therapy Session Note  Patient Details  Name: Chad Rogers MRN: [id number] Date of Birth: [birth year]  Today's Date: 02/18/2016 PT Individual Time: 1130-1200 PT Individual Time Calculation (min): 30 min    Short Term Goals: Week 1:  PT Short Term Goal 1 (Week 1): Patient will perform stand pivot transfers with close supervision. PT Short Term Goal 2 (Week 1): Patient will ambulate 30 ft with min A.  PT Short Term Goal 3 (Week 1): Patient will initiate stair training. PT Short Term Goal 4 (Week 1): Patient will propel wheelchair 150 ft using BUE for strengthening and endurance.   Skilled Therapeutic Interventions/Progress Updates:    Pt was seen bedside in the am sitting up in w/c. Pt transported to rehab gym. Pt performed multiple sit to stand transfers with min A and verbal cues. Pt ambulated 10 feet on uneven surface with mod A and verbal cues. Pt ambulated 50 feet x 2 with rolling walker and min A with slow cadence and decreased step length B. Pt required multiple rest breaks. Pt returned to room and left sitting up in w/c with family at bedside.   Therapy Documentation Precautions:  Precautions Precautions: Fall Precaution Comments: monitor BP Restrictions Weight Bearing Restrictions: Yes LLE Weight Bearing: Weight bearing as tolerated General:   Vital Signs: Oxygen Therapy SpO2: 97 % O2 Device: Nasal Cannula O2 Flow Rate (L/min): 2 L/min Pain: Pt c/o mild L sided rib pain.   See Function Navigator for Current Functional Status.   Therapy/Group: Individual Therapy  [name], [name] 02/18/2016, 12:47 PM

## 2016-02-19 ENCOUNTER — Inpatient Hospital Stay (HOSPITAL_COMMUNITY): Payer: Medicare Other | Admitting: Speech Pathology

## 2016-02-19 ENCOUNTER — Inpatient Hospital Stay (HOSPITAL_COMMUNITY): Payer: Medicare Other | Admitting: Physical Therapy

## 2016-02-19 ENCOUNTER — Inpatient Hospital Stay (HOSPITAL_COMMUNITY): Payer: Medicare Other | Admitting: Occupational Therapy

## 2016-02-19 LAB — BASIC METABOLIC PANEL
ANION GAP: 9 (ref 5–15)
BUN: 30 mg/dL — ABNORMAL HIGH (ref 6–20)
CALCIUM: 8.9 mg/dL (ref 8.9–10.3)
CO2: 27 mmol/L (ref 22–32)
CREATININE: 1.08 mg/dL (ref 0.61–1.24)
Chloride: 93 mmol/L — ABNORMAL LOW (ref 101–111)
Glucose, Bld: 152 mg/dL — ABNORMAL HIGH (ref 65–99)
Potassium: 3.4 mmol/L — ABNORMAL LOW (ref 3.5–5.1)
SODIUM: 129 mmol/L — AB (ref 135–145)

## 2016-02-19 MED ORDER — FUROSEMIDE 20 MG PO TABS
20.0000 mg | ORAL_TABLET | Freq: Two times a day (BID) | ORAL | Status: DC
  Administered 2016-02-19 – 2016-02-23 (×8): 20 mg via ORAL
  Filled 2016-02-19 (×9): qty 1

## 2016-02-19 NOTE — Progress Notes (Signed)
Speech Language Pathology Daily Session Note  Patient Details  Name: KUTLER GUPTA MRN: [id number] Date of Birth: 1936-03-05  Today's Date: 02/19/2016 SLP Individual Time: 1345-1430 SLP Individual Time Calculation (min): 45 min   Short Term Goals: Week 1: SLP Short Term Goal 1 (Week 1): STG=LTG due to ELOS   Skilled Therapeutic Interventions: Pt was seen for skilled ST targeting cognitive goals.  Therapist faciltiated the session with ongoing practice for medication management from previous therapy session.  Pt initially required max assist verbal cues for organization when loading pills into a pill box; however, as task progressed, therapist was able to fade cues to supervision verbal cues with set up with increased task familiarity.  Pt was left in chair with call bell within reach.  Continue per current plan of care.       Function:  Eating Eating              Cognition Comprehension Comprehension assist level: Understands basic 90% of the time/cues < 10% of the time  Expression   Expression assist level: Expresses basic needs/ideas: With extra time/assistive device  Social Interaction Social Interaction assist level: Interacts appropriately 90% of the time - Needs monitoring or encouragement for participation or interaction.  Problem Solving Problem solving assist level: Solves basic 75 - 89% of the time/requires cueing 10 - 24% of the time  Memory Memory assist level: Recognizes or recalls 75 - 89% of the time/requires cueing 10 - 24% of the time    Pain Pain Assessment Pain Assessment: No/denies pain   Therapy/Group: Individual Therapy  Sharelle Burditt, [name] 02/19/2016, 4:11 PM

## 2016-02-19 NOTE — Progress Notes (Signed)
Physical Therapy Session Note  Patient Details  Name: Chad Rogers MRN: [id number] Date of Birth: Jan 22, 1936  Today's Date: 02/19/2016 PT Individual Time: 5929-2446 PT Individual Time Calculation (min): 45 min    Short Term Goals:Week 1:  PT Short Term Goal 1 (Week 1): Patient will perform stand pivot transfers with close supervision. PT Short Term Goal 2 (Week 1): Patient will ambulate 30 ft with min A.  PT Short Term Goal 3 (Week 1): Patient will initiate stair training. PT Short Term Goal 4 (Week 1): Patient will propel wheelchair 150 ft using BUE for strengthening and endurance.    Therapy Documentation Precautions:  Precautions Precautions: Fall Precaution Comments: monitor BP Restrictions Weight Bearing Restrictions: No LLE Weight Bearing: Weight bearing as tolerated General:   Vital Signs: Therapy Vitals Temp: 97.7 F (36.5 C) Temp Source: Oral Pulse Rate: (!) 102 Resp: 18 BP: (!) 90/50 Patient Position (if appropriate): Sitting Oxygen Therapy SpO2: 96 % O2 Device: Nasal Cannula O2 Flow Rate (L/min): 2 L/min FiO2 (%): 28 % Pain: Pain Assessment Pain Assessment: No/denies pain  Sit to and from stand transfer with use of RW min assist. Verbalc cues for hand placement.   Patient ambulated 70 feetx2 with RW min assist. Patient ambulated with a step through gait pattern. Manual facilitation for increased right step length. Verbal cues for posture and positioning in RW. Patient limited by fatigue.  Patient up and down 3 step with bilateral handrails min assist. Patient limited by fatigue. Verbal cues for proper sequence and technique. Step to pattern.  Vitals after stairs: BP: 100/52 HR: 102 O2: 98% on 2LO2 vuia nasal cannula.  Standing there ex:  Heel raises 1x min A with RW  Mini-squats 10x with RW  Patient reports increased fatigue today from previous sessions on Sunday. agreeable for participation.  Patient required frequent and prolonged rest breaks  throughout session. Patient returned to room at end of session with all needs met.   See Function Navigator for Current Functional Status.   Therapy/Group: Individual Therapy  Retta Diones 02/19/2016, 9:15 AM

## 2016-02-19 NOTE — Progress Notes (Signed)
Occupational Therapy Session Note  Patient Details  Name: ELEAN MITTELMAN MRN: [id number] Date of Birth: 01-04-1936  Today's Date: 02/19/2016 OT Individual Time: 1300-1345 OT Individual Time Calculation (min): 45 min   Short Term Goals: Week 1:  OT Short Term Goal 1 (Week 1): Pt will be able to ambulate to toilet with LRD with min A. OT Short Term Goal 2 (Week 1): Pt will demonstrate improved activity tolerance to stand at sink for 1 min to wash face, brush hair. OT Short Term Goal 3 (Week 1): Pt will don pants with steadying A when standing. OT Short Term Goal 4 (Week 1): Pt will demonstrate improved activity tolerance to tolerate a shower in sitting.  Skilled Therapeutic Interventions/Progress Updates:    Skilled 1:1 OT session focused on LB dressing modifications, activity tolerance, standing endurance, and standing balance. Pt on 2L of O2 throughout session. SpO2 remained above 94 throughout activity.  Sitting EOB, pt able to doff abdominal binder, doff T-shirt, and don new shirt with set-up and supervision for sitting balance. Pt required Max A to don TED hose, socks, and shoes 2/2 fatigue. Min guard A for stand-pivot transfer from bed<>w/c. Pt propelled w/c out of room with min VC for technique. Min guard A for sit<>stands. Pt tolerated 1 minute standing bouts for there-ex including marches and arm raises. Pt required close supervision and intermittent min guard A to maintain standing balance. Pt reported max fatigue after 3 standing bouts with extended rest breaks in between. Pt returned to room at end of session and left in care of SLP.   Therapy Documentation Precautions:  Precautions Precautions: Fall Precaution Comments: monitor BP Restrictions Weight Bearing Restrictions: No LLE Weight Bearing: Weight bearing as tolerated Vital Signs: Therapy Vitals Pulse Rate: 99 BP: 98/67 Patient Position (if appropriate): Sitting Oxygen Therapy SpO2: 94 % O2 Device: Nasal Cannula O2  Flow Rate (L/min): 2 L/min Pulse Oximetry Type: Intermittent Pain: Pain Assessment Pain Assessment: 0-10 Pain Score: 2  Faces Pain Scale: Hurts even more Pain Type: Acute pain Pain Location: Rib cage Pain Orientation: Lower;Mid Pain Descriptors / Indicators: Aching Pain Onset: With Activity Patients Stated Pain Goal: 2 Pain Intervention(s): Repositioned Multiple Pain Sites: No  See Function Navigator for Current Functional Status.  Therapy/Group: Individual Therapy  Valma Cava 02/19/2016, 2:41 PM

## 2016-02-19 NOTE — Progress Notes (Signed)
Subjective/Complaints: Fatigues easily, patient states he occasionally gets dizzy. Has abdominal binder and TED hose on today. Have reviewed. Blood pressures. Fluid balance. Negative 3.9 liters since 9/28  ROS: Pt denies fever, rash/itching, headache, blurred or double vision, nausea, vomiting, abdominal pain, diarrhea, chest pain, shortness of breath, palpitations, dysuria, dizziness, neck or back pain, bleeding, anxiety, or depression  Objective: Vital Signs: Blood pressure (!) 90/50, pulse (!) 102, temperature 97.7 F (36.5 C), temperature source Oral, resp. rate 18, height 5\' 11"  (1.803 m), weight 67.4 kg (148 lb 8 oz), SpO2 96 %. No results found. No results found for this or any previous visit (from the past 72 hour(s)).   HEENT: [place] in nares Cardio: RRR and no murmur Resp: CTA B/L and unlabored. Oxygen via [place] GI: BS positive and psoriatic plaques, face and forearms Extremity:  BS positive and NT, ND Skin:   Other psoriatic plaques face and forearm Neuro: Alert/Oriented and Abnormal Motor 4/5 in BUE and BLE Musc/Skel:  Other restricted ROM B shoulder and hips and knees Gen NAD, frail appearing.    Assessment/Plan: 1. Functional deficits secondary to Deconditioning multiple medical and Left acetabular fx which require 3+ hours per day of interdisciplinary therapy in a comprehensive inpatient rehab setting. Physiatrist is providing close team supervision and 24 hour management of active medical problems listed below. Physiatrist and rehab team continue to assess barriers to discharge/monitor patient progress toward functional and medical goals. FIM: Function - Bathing Position: Sitting EOB Body parts bathed by patient: Right arm, Left arm, Chest, Abdomen, Front perineal area, Right upper leg, Left upper leg, Right lower leg, Left lower leg Body parts bathed by helper: Back, Buttocks Assist Level: Touching or steadying assistance(Pt > 75%)  Function- Upper Body  Dressing/Undressing What is the patient wearing?: Pull over shirt/dress Pull over shirt/dress - Perfomed by patient: Thread/unthread right sleeve, Thread/unthread left sleeve, Put head through opening, Pull shirt over trunk Assist Level: Supervision or verbal cues Function - Lower Body Dressing/Undressing What is the patient wearing?: Pants, Ted Hose, Shoes Position: Sitting EOB Pants- Performed by patient: Pull pants up/down Pants- Performed by helper: Thread/unthread right pants leg, Thread/unthread left pants leg Shoes - Performed by helper: Don/doff right shoe, Don/doff left shoe, Fasten right, Fasten left TED Hose - Performed by helper: Don/doff right TED hose, Don/doff left TED hose  Function - Toileting Toileting steps completed by patient: Adjust clothing prior to toileting, Performs perineal hygiene, Adjust clothing after toileting Toileting Assistive Devices: Grab bar or rail Assist level: Supervision or verbal cues  Function Midwife transfer assistive device: Grab bar Assist level to toilet: Moderate assist (Pt 50 - 74%/lift or lower) Assist level from toilet: Moderate assist (Pt 50 - 74%/lift or lower)  Function - Chair/bed transfer Chair/bed transfer method: Stand pivot Chair/bed transfer assist level: Touching or steadying assistance (Pt > 75%) Chair/bed transfer assistive device: Armrests  Function - Locomotion: Wheelchair Will patient use wheelchair at discharge?:  (TBD) Type: Manual Max wheelchair distance: 25 Assist Level: Touching or steadying assistance (Pt > 75%) Function - Locomotion: Ambulation Assistive device: Walker-rolling Max distance: 50 Assist level: Touching or steadying assistance (Pt > 75%) Assist level: Touching or steadying assistance (Pt > 75%) Walk 50 feet with 2 turns activity did not occur: Safety/medical concerns (fatigue/low BP) Assist level: Touching or steadying assistance (Pt > 75%) Walk 150 feet activity did not  occur: Safety/medical concerns Walk 10 feet on uneven surfaces activity did not occur: Safety/medical concerns Assist level:  Moderate assist (Pt 50 - 74%)  Function - Comprehension Comprehension: Auditory Comprehension assist level: Understands basic 75 - 89% of the time/ requires cueing 10 - 24% of the time  Function - Expression Expression: Verbal Expression assist level: Expresses basic 90% of the time/requires cueing < 10% of the time.  Function - Social Interaction Social Interaction assist level: Interacts appropriately 75 - 89% of the time - Needs redirection for appropriate language or to initiate interaction.  Function - Problem Solving Problem solving assist level: Solves basic 50 - 74% of the time/requires cueing 25 - 49% of the time  Function - Memory Memory assist level: Recognizes or recalls 50 - 74% of the time/requires cueing 25 - 49% of the time Patient normally able to recall (first 3 days only): Current season, Staff names and faces, That he or she is in a hospital  Medical Problem List and Plan: 1.  Deconditioning and left acetabular fracture secondary to fall, resulting in multiple rib fractures and respiratory failure requiring mechanical ventilation and tracheostomy  -continue CIR PT, OT, speech 2.  DVT Prophylaxis/Anticoagulation: will add  Pharmaceutical: Lovenox--dopplers negative  3. Pain Management: try to minimize use of opiates. Given his history of alcohol abuse 4. Mood: LCSW to follow for evaluation and support.  5. Neuropsych: This patient is capable of making decisions on his own behalf. 6. Skin/Wound Care: Lubricating cream to eye lids. Steroid cream to psoriatic  plaques? 7. Fluids/Electrolytes/Nutrition: Strict I/O. Encourage po  8. CAD with ICM/CHF: No signs of overload  -. Low salt diet. Reduce lasix 20 mg twice a day monitor for fluid retention-hold lisinopril.  -weight 67kg today  9. A fib: Monitor HR bid. On amiodarone.  10. Hypotension:   BP ranges from  80/90 to 110. Monitor BP bid--will set parameters on lasix. Add TEDs 11. Anemia: due to chronic illness and/or Vit B 12 deficiency.  anemia panel--MCV 105.  12. Hepatic steatosis/ Alcohol abuse: LFTs/ platelets stable.  13. COPD/ Pulmonary fibrosis: Respiratory status compounded by multiple left rib fractures/left chest contusion. Continue duonebs, elliptra, and  Restrictive lung disease SLP for resp muscle training   LOS (Days) 4 A FACE TO Russell E 02/19/2016, 10:05 AM

## 2016-02-19 NOTE — Progress Notes (Signed)
Speech Language Pathology Daily Session Note  Patient Details  Name: Chad Rogers MRN: [id number] Date of Birth: 24-Nov-1935  Today's Date: 02/19/2016 SLP Individual Time: 1000-1100 SLP Individual Time Calculation (min): 60 min   Short Term Goals: Week 1: SLP Short Term Goal 1 (Week 1): STG=LTG due to ELOS   Skilled Therapeutic Interventions:   Skilled treatment session focused on addressing cognition goals. SLP facilitated session by providing Max assist to recall previous home medications and Total assist to recall current medications.  However, after the implementation of an external memory aid patient was able to recall current medications and frequency with Min assist verbal cues to locate information and Mod assist verbal and visual cues to identify where they would go in a medication organizer.  Recommend to load medication box upon next visit.     Function:  Cognition Comprehension Comprehension assist level: Understands basic 75 - 89% of the time/ requires cueing 10 - 24% of the time  Expression   Expression assist level: Expresses basic 90% of the time/requires cueing < 10% of the time.  Social Interaction Social Interaction assist level: Interacts appropriately 75 - 89% of the time - Needs redirection for appropriate language or to initiate interaction.  Problem Solving Problem solving assist level: Solves basic 75 - 89% of the time/requires cueing 10 - 24% of the time  Memory Memory assist level: Recognizes or recalls 50 - 74% of the time/requires cueing 25 - 49% of the time    Pain Pain Assessment Pain Assessment: Faces Faces Pain Scale: Hurts even more Pain Location: Rib cage Pain Descriptors / Indicators: Aching Patients Stated Pain Goal: 2 Pain Intervention(s): RN made aware;Other (Comment) (and administered medication ) Multiple Pain Sites: No  Therapy/Group: Individual Therapy  Carmelia Roller., CCC-SLP [id number]  [place] 02/19/2016, 12:36  PM

## 2016-02-20 ENCOUNTER — Inpatient Hospital Stay (HOSPITAL_COMMUNITY): Payer: Medicare Other

## 2016-02-20 ENCOUNTER — Inpatient Hospital Stay (HOSPITAL_COMMUNITY): Payer: Medicare Other | Admitting: Physical Therapy

## 2016-02-20 ENCOUNTER — Inpatient Hospital Stay (HOSPITAL_COMMUNITY): Payer: Medicare Other | Admitting: Speech Pathology

## 2016-02-20 ENCOUNTER — Inpatient Hospital Stay (HOSPITAL_COMMUNITY): Payer: Medicare Other | Admitting: Occupational Therapy

## 2016-02-20 DIAGNOSIS — I482 Chronic atrial fibrillation: Secondary | ICD-10-CM

## 2016-02-20 MED ORDER — OXYCODONE HCL 5 MG PO TABS
5.0000 mg | ORAL_TABLET | Freq: Three times a day (TID) | ORAL | Status: DC | PRN
  Administered 2016-02-20: 5 mg via ORAL
  Filled 2016-02-20: qty 1

## 2016-02-20 MED ORDER — POTASSIUM CHLORIDE CRYS ER 10 MEQ PO TBCR
10.0000 meq | EXTENDED_RELEASE_TABLET | Freq: Two times a day (BID) | ORAL | Status: DC
  Administered 2016-02-20 – 2016-02-23 (×7): 10 meq via ORAL
  Filled 2016-02-20 (×7): qty 1

## 2016-02-20 NOTE — Progress Notes (Signed)
Physical Therapy Session Note  Patient Details  Name: Chad Rogers MRN: [id number] Date of Birth: 1935/11/25  Today's Date: 02/20/2016 PT Individual Time: 1305-1400 PT Individual Time Calculation (min): 55 min    Short Term Goals: Week 1:  PT Short Term Goal 1 (Week 1): Patient will perform stand pivot transfers with close supervision. PT Short Term Goal 2 (Week 1): Patient will ambulate 30 ft with min A.  PT Short Term Goal 3 (Week 1): Patient will initiate stair training. PT Short Term Goal 4 (Week 1): Patient will propel wheelchair 150 ft using BUE for strengthening and endurance.   Skilled Therapeutic Interventions/Progress Updates:    Session focused on overall endurance/activity tolerance, transfers, gait training, stair negotiation, UE/LE strengthening during w/c propulsion and d/c planning. Pt sats >94% on 2L O2 during activity with shortness of breath but able to recover with rest breaks within 1-2 minutes. Pt able to negotiate 4 stairs today and increase gait distance using RW (125' x 2 trials with seated rest break). Discussed d/c planning in regards to available assist at home, concerns/barriers, and progression of endurance/mobility upon discharge.    Therapy Documentation Precautions:  Precautions Precautions: Fall Precaution Comments: monitor BP Restrictions Weight Bearing Restrictions: No LLE Weight Bearing: Weight bearing as tolerated  Pain: Pain Assessment Pain Assessment: No/denies pain   See Function Navigator for Current Functional Status.   Therapy/Group: Individual Therapy  Canary Brim Ivory Broad, PT, DPT  02/20/2016, 2:09 PM

## 2016-02-20 NOTE — Progress Notes (Signed)
Subjective/Complaints: No SOB last noc, slept well, participating in Cognitive tasks with SLP this am  ROS: Pt denies fever, rash/itching, headache, blurred or double vision, nausea, vomiting, abdominal pain, diarrhea, chest pain, shortness of breath, palpitations, dysuria, dizziness, neck or back pain, bleeding, anxiety, or depression  Objective: Vital Signs: Blood pressure (!) 91/52, pulse (!) 51, temperature 97.9 F (36.6 C), temperature source Oral, resp. rate 16, height 5' 11"  (1.803 m), weight 67.4 kg (148 lb 8 oz), SpO2 97 %. No results found. Results for orders placed or performed during the hospital encounter of 02/15/16 (from the past 72 hour(s))  Basic metabolic panel     Status: Abnormal   Collection Time: 02/19/16  2:40 PM  Result Value Ref Range   Sodium 129 (L) 135 - 145 mmol/L   Potassium 3.4 (L) 3.5 - 5.1 mmol/L   Chloride 93 (L) 101 - 111 mmol/L   CO2 27 22 - 32 mmol/L   Glucose, Bld 152 (H) 65 - 99 mg/dL   BUN 30 (H) 6 - 20 mg/dL   Creatinine, Ser 1.08 0.61 - 1.24 mg/dL   Calcium 8.9 8.9 - 10.3 mg/dL   GFR calc non Af Amer >60 >60 mL/min   GFR calc Af Amer >60 >60 mL/min    Comment: (NOTE) The eGFR has been calculated using the CKD EPI equation. This calculation has not been validated in all clinical situations. eGFR's persistently <60 mL/min signify possible Chronic Kidney Disease.    Anion gap 9 5 - 15     HEENT: [place] in nares Cardio: RRR and no murmur Resp: CTA B/L and unlabored. Oxygen via [place] GI: BS positive and psoriatic plaques, face and forearms Extremity:  BS positive and NT, ND Skin:   Other psoriatic plaques face and forearm Neuro: Alert/Oriented and Abnormal Motor 4/5 in BUE and BLE Musc/Skel:  Other restricted ROM B shoulder and hips and knees Gen NAD, frail appearing.    Assessment/Plan: 1. Functional deficits secondary to Deconditioning multiple medical and Left acetabular fx which require 3+ hours per day of interdisciplinary therapy in  a comprehensive inpatient rehab setting. Physiatrist is providing close team supervision and 24 hour management of active medical problems listed below. Physiatrist and rehab team continue to assess barriers to discharge/monitor patient progress toward functional and medical goals. FIM: Function - Bathing Position: Sitting EOB Body parts bathed by patient: Right arm, Left arm, Chest, Abdomen, Front perineal area, Right upper leg, Left upper leg, Right lower leg, Left lower leg Body parts bathed by helper: Back, Buttocks Assist Level: Touching or steadying assistance(Pt > 75%)  Function- Upper Body Dressing/Undressing What is the patient wearing?: Pull over shirt/dress Pull over shirt/dress - Perfomed by patient: Thread/unthread right sleeve, Thread/unthread left sleeve, Put head through opening, Pull shirt over trunk Assist Level: Supervision or verbal cues Function - Lower Body Dressing/Undressing What is the patient wearing?: [employer], Socks, Shoes Position: Sitting EOB Pants- Performed by patient: Pull pants up/down Pants- Performed by helper: Thread/unthread right pants leg, Thread/unthread left pants leg Socks - Performed by helper: Don/doff right sock, Don/doff left sock Shoes - Performed by helper: Don/doff right shoe, Don/doff left shoe, Fasten right, Fasten left TED Hose - Performed by helper: Don/doff left TED hose, Don/doff right TED hose Assist for footwear: Maximal assist Assist for lower body dressing: Touching or steadying assistance (Pt > 75%)  Function - Toileting Toileting steps completed by patient: Adjust clothing prior to toileting, Performs perineal hygiene, Adjust clothing after toileting Toileting  steps completed by helper: Performs perineal hygiene (per Courtney Paris, NT) Toileting Assistive Devices: Grab bar or rail Assist level: Supervision or verbal cues  Function - Air cabin crew transfer assistive device: Grab bar Assist level to toilet: Moderate  assist (Pt 50 - 74%/lift or lower) Assist level from toilet: Moderate assist (Pt 50 - 74%/lift or lower) Assist level to bedside commode (at bedside): Moderate assist (Pt 50 - 74%/lift or lower) (per Elmo Putt, NT) Assist level from bedside commode (at bedside): Moderate assist (Pt 50 - 74%/lift or lower)  Function - Chair/bed transfer Chair/bed transfer method: Stand pivot Chair/bed transfer assist level: Touching or steadying assistance (Pt > 75%) Chair/bed transfer assistive device: Armrests  Function - Locomotion: Wheelchair Will patient use wheelchair at discharge?:  (TBD) Type: Manual Max wheelchair distance: 25 Assist Level: Touching or steadying assistance (Pt > 75%) Function - Locomotion: Ambulation Assistive device: Walker-rolling Max distance: 70 Assist level: Touching or steadying assistance (Pt > 75%) Assist level: Touching or steadying assistance (Pt > 75%) Walk 50 feet with 2 turns activity did not occur: Safety/medical concerns (fatigue/low BP) Assist level: Touching or steadying assistance (Pt > 75%) Walk 150 feet activity did not occur: Safety/medical concerns Walk 10 feet on uneven surfaces activity did not occur: Safety/medical concerns Assist level: Moderate assist (Pt 50 - 74%)  Function - Comprehension Comprehension: Auditory Comprehension assist level: Understands basic 90% of the time/cues < 10% of the time  Function - Expression Expression: Verbal Expression assist level: Expresses basic needs/ideas: With extra time/assistive device  Function - Social Interaction Social Interaction assist level: Interacts appropriately 90% of the time - Needs monitoring or encouragement for participation or interaction.  Function - Problem Solving Problem solving assist level: Solves basic 75 - 89% of the time/requires cueing 10 - 24% of the time  Function - Memory Memory assist level: Recognizes or recalls 75 - 89% of the time/requires cueing 10 - 24% of the  time Patient normally able to recall (first 3 days only): Current season, Staff names and faces, That he or she is in a hospital  Medical Problem List and Plan: 1.  Deconditioning and left acetabular fracture secondary to fall, resulting in multiple rib fractures and respiratory failure requiring mechanical ventilation and tracheostomy  -continue CIR PT, OT, speech, team conf in am 2.  DVT Prophylaxis/Anticoagulation: will add  Pharmaceutical: Lovenox--dopplers negative  3. Pain Management: try to minimize use of opiates. Given his history of alcohol abuse 4. Mood: LCSW to follow for evaluation and support.  5. Neuropsych: This patient is capable of making decisions on his own behalf. 6. Skin/Wound Care: Lubricating cream to eye lids. Steroid cream to psoriatic  plaques? 7. Fluids/Electrolytes/Nutrition: Strict I/O. Encourage po  8. CAD with ICM/CHF: No signs of overload  -. Low salt diet. Reduce lasix 20 mg twice a day monitor for fluid retention-hold lisinopril.  -weight 67kg today  9. A fib: Monitor HR bid. On amiodarone, low HR this am, ~90 on my exam but generally running in 90-100 range.  10. Hypotension:  BP ranges from  80/90 to 110. Monitor BP bid--will set parameters on lasix. Add TEDs 11. Anemia: due to chronic illness and/or Vit B 12 deficiency.  anemia panel--MCV 105.  12. Hepatic steatosis/ Alcohol abuse: LFTs/ platelets stable.  13. COPD/ Pulmonary fibrosis: Respiratory status compounded by multiple left rib fractures/left chest contusion. Continue duonebs, elliptra, and  Restrictive lung disease SLP for resp muscle training 14. Hyponatremia likely related to diuresis, will monitor,  should improve now that lasix reduced  LOS (Days) 5 A FACE TO FACE EVALUATION WAS PERFORMED  KIRSTEINS,ANDREW E 02/20/2016, 8:47 AM

## 2016-02-20 NOTE — Interval H&P Note (Signed)
Chad Rogers was admitted on 9/28 to Inpatient Rehabilitation with the diagnosis of left acetabular fracture and deconditioning.  [name] history has been reviewed, patient examined, and there is no change in status.  Patient continues to be appropriate for intensive inpatient rehabilitation.  I have reviewed the patient's chart and labs.  Questions were answered to the patient's satisfaction by team. The PAPE has been reviewed and assessment remains appropriate.  Ankit Lorie Phenix 02/20/2016, 2:38 PM

## 2016-02-20 NOTE — Progress Notes (Signed)
Speech Language Pathology Daily Session Note  Patient Details  Name: Chad Rogers MRN: [id number] Date of Birth: 1935-10-23  Today's Date: 02/20/2016 SLP Individual Time: 0830-0900 SLP Individual Time Calculation (min): 30 min   Short Term Goals: Week 1: SLP Short Term Goal 1 (Week 1): STG=LTG due to ELOS   Skilled Therapeutic Interventions:   Skilled treatment session focused on addressing cognition goals. SLP facilitated session by providing Supervision level verbal and visual cues to self-monitor and correct errors during a money management task with dollar and change.  Patient demonstrated sustained attention to task for 15 minutes with Supervision level verbal cues for redirection.  Continue with current plan of care.    Function:  Cognition Comprehension Comprehension assist level: Follows basic conversation/direction with extra time/assistive device  Expression   Expression assist level: Expresses basic needs/ideas: With extra time/assistive device  Social Interaction Social Interaction assist level: Interacts appropriately 90% of the time - Needs monitoring or encouragement for participation or interaction.  Problem Solving Problem solving assist level: Solves basic 75 - 89% of the time/requires cueing 10 - 24% of the time  Memory Memory assist level: Recognizes or recalls 75 - 89% of the time/requires cueing 10 - 24% of the time    Pain Pain Assessment Pain Assessment: 0-10 Pain Score: 6  Pain Type: Acute pain Pain Location: Rib cage Pain Orientation: Left Pain Descriptors / Indicators: Aching Pain Onset: On-going Patients Stated Pain Goal: 2 Pain Intervention(s): RN made aware;Repositioned;Other (Comment) (RN administered medication ) Multiple Pain Sites: No  Therapy/Group: Individual Therapy  Chad Rogers., CCC-SLP [id number]  [name] 02/20/2016, 9:01 AM

## 2016-02-20 NOTE — Progress Notes (Signed)
Occupational Therapy Session Note  Patient Details  Name: Chad Rogers MRN: [id number] Date of Birth: September 12, 1935  Today's Date: 02/20/2016 OT Individual Time: 1430-1525 OT Individual Time Calculation (min): 55 min   Short Term Goals: Week 1:  OT Short Term Goal 1 (Week 1): Pt will be able to ambulate to toilet with LRD with min A. OT Short Term Goal 2 (Week 1): Pt will demonstrate improved activity tolerance to stand at sink for 1 min to wash face, brush hair. OT Short Term Goal 3 (Week 1): Pt will don pants with steadying A when standing. OT Short Term Goal 4 (Week 1): Pt will demonstrate improved activity tolerance to tolerate a shower in sitting.  Skilled Therapeutic Interventions/Progress Updates:    1:1 OT session focused on activity tolerance, standing endurance, and modified bathing/dressing. Pts SpO2 remained above 95% on 2L during ADLs. Pt completed bathing/dressing at the sink w/ overall Min A w/ increased time and multiple rest breaks averaging about 1-2 minutes. Pt needed min instructional cues to complete bathing thoroughly 2/2 fatigue during activity. Pt performed functional mobility with RW in the hallway, took 3 minute seated rest break, then ambulated back to room w/ CGA. Pt returned to bed at end of session and left with needs met.   Therapy Documentation Precautions:  Precautions Precautions: Fall Precaution Comments: monitor BP Restrictions Weight Bearing Restrictions: No LLE Weight Bearing: Weight bearing as tolerated Vital Signs: Patient Position (if appropriate): Sitting Oxygen Therapy SpO2: 98 % O2 Device: Nasal Cannula- 2L Pain: Pain Assessment Pain Assessment: No/denies pain ADL: ADL ADL Comments: refer to functional navigator  See Function Navigator for Current Functional Status.   Therapy/Group: Individual Therapy  Valma Cava 02/20/2016, 3:31 PM

## 2016-02-20 NOTE — Evaluation (Signed)
Recreational Therapy Assessment and Plan  Patient Details  Name: Chad Rogers MRN: [id number] Date of Birth: May 09, 1936 Today's Date: 02/20/2016  Rehab Potential: Good ELOS: 10 days   Assessment Clinical Impression:  Problem List: Patient Active Problem List   Diagnosis Date Noted  . Debility 02/15/2016  . Atrial fibrillation, persistent ([place]) 01/15/2016  . Absent pulse in lower extremity   . Acute on chronic systolic CHF (congestive heart failure) ([place]) 01/01/2016  . NSVT (nonsustained ventricular tachycardia) ([place]) 01/01/2016  . Respiratory failure ([place])   . Traumatic fracture of ribs with pneumothorax 12/29/2015  . Multiple rib fractures 12/29/2015  . Cardiomyopathy ([place])   . Hypertension   . Pulmonary fibrosis ([place])   . Osteoarthritis   . Tobacco abuse     Past Medical History:      Past Medical History:  Diagnosis Date  . Alcohol abuse    family reports that [pt will drink at least 6 beers or more a day,   . Allergic rhinitis   . Cardiomyopathy [id number]   Acute systolic CHF at presentation; 05/2009 cardiac cath- 06/01/09  non obst coronary artery disease with EF 25%  . CHF (congestive heart failure) ([place])   . COPD (chronic obstructive pulmonary disease) ([place])   . Coronary artery disease   . Hypertension   . Osteoarthritis   . Psoriasis   . Pulmonary fibrosis ([place])    12/2004 with basilar fibrotic changes;  PFT's 06/07/09 FEV1 1.98 (68%) raio 53 no better after B2,  DLC0105%  . Skin cancer   . Subdural hygroma 03/2005   bilateral   . Tobacco abuse    100 pack years; quit   Past Surgical History:       Past Surgical History:  Procedure Laterality Date  . HAND SURGERY    . INGUINAL HERNIA REPAIR    . PERCUTANEOUS TRACHEOSTOMY N/A 01/10/2016   Procedure: PERCUTANEOUS TRACHEOSTOMY;  Surgeon: Judeth Horn, MD;  Location: [place];  Service: General;  Laterality: N/A;  . ROTATOR CUFF REPAIR      Assessment & Plan Clinical  Impression: Chad Rogers is a 80 year old male with history of COPD, pulmonary fibrosis, CAD with ICM--EF 25% andCHF, ETOH abuse, HTN who slipped and fell on 12/28/15 with subsequent left 3 rd - 12th rib fractures, left PTX and left chest emphysema, small left pelvic ring/acetabular fracture, L3 and L4 transverse process fractures who developed hypotension with A fib and respiratory failure requiring intubation. Dr. Marcelino Scot evaluated patient and recommended WBAT and no surgical intervention as fracture felt to be due to Insufficiency. Cardiology consulted for management for CHF and for rate control. Medications adjusted and 2 D echo done revealing diffuse hypokineses with EF 25-30%. Rate controlled with amiodarone and CHF managed IV diuresis.  He did develop mottling/coolness of LLE and ABI done revealing severe BLE tibial disease. Dr. Bridgett Larsson recommended monitoring and transient changes felt to be due to decrease in cardiac output affecting perfusion. He had difficulty weaning off vent and tracheostomy performed 8/23. He was transferred to [facility] for vent wean and rehab. He tolerated extubation and was decanulated by 9/22. He was started on oxycodone for reports of diffuse pain. Diet advanced to regular textures, thin by 9/26. He is tolerating increase in activity with improved participation. CIR was recommended for follow up therapy.    Patient transferred to CIR on 02/15/2016.    Met with pt to discuss TR services.  Pt is limited by decreased activity tolerance.  No further TR  at this time.  Will continue to monitor through team for future participation. Leisure History/Participation Premorbid leisure interest/current participation: [place] Other Leisure Interests: Television Leisure Participation Style: Alone;With Family/Friends Awareness of Community Resources: Fair-identify 2 post discharge leisure resources Psychosocial /  Spiritual Social interaction - Mood/Behavior: Cooperative Academic librarian Appropriate for Education?: Yes Recreational Therapy Orientation Orientation -Reviewed with patient: Available activity resources Strengths/Weaknesses Patient Strengths/Abilities: Willingness to participate;Active premorbidly Patient weaknesses: Physical limitations TR Patient demonstrates impairments in the following area(s): Edema;Endurance;Motor;Safety;Skin Integrity  Plan Rec Therapy Plan Rehab Potential: Good Estimated Length of Stay: 10 days  Recommendations for other services: None  Discharge Criteria: Patient will be discharged from TR if patient refuses treatment 3 consecutive times without medical reason.  If treatment goals not met, if there is a change in medical status, if patient makes no progress towards goals or if patient is discharged from hospital.  The above assessment, treatment plan, treatment alternatives and goals were discussed and mutually agreed upon: by patient  [place] 02/20/2016, 3:40 PM

## 2016-02-20 NOTE — Progress Notes (Signed)
Speech Language Pathology Daily Session Note  Patient Details  Name: Chad Rogers MRN: [id number] Date of Birth: Mar 26, 1936  Today's Date: 02/20/2016 SLP Individual Time: 1130-1155 SLP Individual Time Calculation (min): 25 min   Short Term Goals: Week 1: SLP Short Term Goal 1 (Week 1): STG=LTG due to ELOS   Skilled Therapeutic Interventions:  Pt was seen for skilled ST targeting cognitive goals.  Therapist facilitated the session with completion of medication management tasks from yesterday to address awareness of errors.  Pt initially required mod assist verbal cues to correct errors from loading pills into a pill box; however, as task progressed and after instruction on organizational strategies therapist was able to fade cues to min assist.  Pt was left in bed with call bell within reach.  Continue per current plan of care.    Function:  Eating Eating                 Cognition Comprehension Comprehension assist level: Follows basic conversation/direction with extra time/assistive device  Expression   Expression assist level: Expresses basic needs/ideas: With extra time/assistive device  Social Interaction Social Interaction assist level: Interacts appropriately 90% of the time - Needs monitoring or encouragement for participation or interaction.  Problem Solving Problem solving assist level: Solves basic 75 - 89% of the time/requires cueing 10 - 24% of the time  Memory Memory assist level: Recognizes or recalls 75 - 89% of the time/requires cueing 10 - 24% of the time    Pain Pain Assessment Pain Assessment: No/denies pain   Therapy/Group: Individual Therapy  Theador Jezewski, [name] 02/20/2016, 12:33 PM

## 2016-02-20 NOTE — H&P (View-Only) (Signed)
Physical Medicine and Rehabilitation Admission H&P    CC: Debility   HPI: Chad Rogers is a 80 year old male with history of COPD, pulmonary fibrosis, CAD with ICM--EF 25% andCHF, ETOH abuse, HTN who slipped and fell on 12/28/15 with subsequent left 3 rd - 12th rib fractures, left PTX and left chest emphysema, small left pelvic ring/acetabular fracture, L3 and L4 transverse process fractures who developed hypotension with A fib and respiratory failure requiring intubation.  Dr. Marcelino Scot evaluated patient and recommended WBAT and no surgical intervention as fracture felt to be due to  Insufficiency.  Cardiology consulted for management for CHF and for rate control. Medications adjusted and  2 D echo done revealing diffuse hypokineses with EF 25-30%. Rate controlled with amiodarone and CHF managed IV diuresis.  He did develop mottling/coolness of LLE and ABI done revealing severe BLE tibial disease. Dr. Bridgett Larsson recommended monitoring and transient changes felt to be due to decrease in cardiac output affecting perfusion. He had difficulty weaning off vent and tracheostomy performed 8/23. He was transferred to [facility] for vent wean and rehab. He tolerated extubation and was decanulated by 9/22. He was started on oxycodone for reports of diffuse pain. Diet advanced to regular textures, thin by 9/26.  He is tolerating increase in activity with improved participation. CIR was recommended for follow up therapy.    ROS        Past Medical History:  Diagnosis Date  . Alcohol abuse    family reports that [pt will drink at least 6 beers or more a day,   . Allergic rhinitis   . Cardiomyopathy [id number]   Acute systolic CHF at presentation; 05/2009 cardiac cath- 06/01/09  non obst coronary artery disease with EF 25%  . CHF (congestive heart failure) ([place])   . COPD (chronic obstructive pulmonary disease) ([place])   . Coronary artery disease   . Hypertension   . Osteoarthritis   . Psoriasis   .  Pulmonary fibrosis ([place])    12/2004 with basilar fibrotic changes;  PFT's 06/07/09 FEV1 1.98 (68%) raio 53 no better after B2,  DLC0105%  . Skin cancer   . Subdural hygroma 03/2005   bilateral   . Tobacco abuse    100 pack years; quit         Past Surgical History:  Procedure Laterality Date  . HAND SURGERY    . INGUINAL HERNIA REPAIR    . PERCUTANEOUS TRACHEOSTOMY N/A 01/10/2016   Procedure: PERCUTANEOUS TRACHEOSTOMY;  Surgeon: Judeth Horn, MD;  Location: [place];  Service: General;  Laterality: N/A;  . ROTATOR CUFF REPAIR           Family History  Problem Relation Age of Onset  . Cancer Mother 39    gynecologic  . Aneurysm Father 55    cerebral aneurysm  . Asthma Maternal Grandmother     Social History:  reports that he has been smoking Cigarettes.  He has a 100.00 pack-year smoking history. He has quit using smokeless tobacco.  Per reports he drinks 12 pack beer daily. His drug history is not on file.    Allergies: No Known Allergies          Medications Prior to Admission  Medication Sig Dispense Refill  . acetaminophen (TYLENOL) 325 MG tablet Take 2 tablets (650 mg total) by mouth every 4 (four) hours as needed for mild pain.    Marland Kitchen ALPRAZolam (XANAX) 0.5 MG tablet Take 1 tablet (0.5 mg total) by mouth at bedtime as  needed for sleep.  0  . amiodarone (PACERONE) 400 MG tablet Take 1 tablet (400 mg total) by mouth 2 (two) times daily.    . bethanechol (URECHOLINE) 10 MG tablet Place 1 tablet (10 mg total) into feeding tube 3 (three) times daily.    . bisacodyl (DULCOLAX) 10 MG suppository Place 1 suppository (10 mg total) rectally daily as needed for moderate constipation. 12 suppository 0  . chlorhexidine (PERIDEX) 0.12 % solution Use as directed 15 mLs in the mouth or throat 2 (two) times daily. 120 mL 0  . digoxin (LANOXIN) 0.125 MG tablet Take 1 tablet (0.125 mg total) by mouth daily.    Marland Kitchen enoxaparin (LOVENOX) 40 MG/0.4ML injection  Inject 0.4 mLs (40 mg total) into the skin daily. 0 Syringe   . famotidine (PEPCID) 40 MG/5ML suspension Place 2.5 mLs (20 mg total) into feeding tube daily. 50 mL 0  . folic acid (FOLVITE) 1 MG tablet Take 1 tablet (1 mg total) by mouth daily.    . furosemide (LASIX) 10 MG/ML injection Inject 4 mLs (40 mg total) into the vein 2 (two) times daily. 4 mL 0  . HYDROcodone-acetaminophen (HYCET) 7.5-325 mg/15 ml solution Place 15 mLs into feeding tube every 4 (four) hours as needed for moderate pain. 120 mL 0  . HYDROmorphone (DILAUDID) 1 MG/ML injection Inject 0.5-1 mLs (0.5-1 mg total) into the vein every 4 (four) hours as needed for severe pain (breakthrough pain). 1 mL 0  . insulin aspart (NOVOLOG) 100 UNIT/ML injection Inject 0-15 Units into the skin every 4 (four) hours. 10 mL 11  . ipratropium (ATROVENT) 0.02 % nebulizer solution Take 2.5 mLs (0.5 mg total) by nebulization every 4 (four) hours as needed for wheezing or shortness of breath. 75 mL 12  . levalbuterol (XOPENEX) 0.63 MG/3ML nebulizer solution Take 3 mLs (0.63 mg total) by nebulization every 6 (six) hours as needed for wheezing or shortness of breath. 3 mL 12  . metoprolol tartrate (LOPRESSOR) 25 MG tablet Take 0.5 tablets (12.5 mg total) by mouth 2 (two) times daily.    . Mouthwashes (MOUTH RINSE) LIQD solution 15 mLs by Mouth Rinse route QID.  0  . Multiple Vitamin (MULTIVITAMIN WITH MINERALS) TABS tablet Take 1 tablet by mouth daily.    . Nutritional Supplements (FEEDING SUPPLEMENT, VITAL AF 1.2 CAL,) LIQD Place 1,000 mLs into feeding tube continuous.    . ondansetron (ZOFRAN) 4 MG tablet Take 1 tablet (4 mg total) by mouth every 6 (six) hours as needed for nausea. 20 tablet 0  . potassium chloride 20 MEQ/15ML (10%) SOLN Take 30 mLs (40 mEq total) by mouth 2 (two) times daily. 450 mL 0  . Potassium Chloride in Dextrose (DEXTROSE 5 % WITH KCL 20 MEQ / L) 20-5 MEQ/L-% Inject 1,000 mLs (20 mEq total) into the vein continuous.     Marland Kitchen QUEtiapine (SEROQUEL) 50 MG tablet Place 1 tablet (50 mg total) into feeding tube 2 (two) times daily.    . sertraline (ZOLOFT) 50 MG tablet Take 1 tablet (50 mg total) by mouth daily.    . sodium chloride flush (NS) 0.9 % SOLN 10-40 mLs by Intracatheter route as needed (flush).    . thiamine 100 MG tablet Take 1 tablet (100 mg total) by mouth daily.    . Water For Irrigation, Sterile (FREE WATER) SOLN Place 200 mLs into feeding tube every 8 (eight) hours.      Home: Home Living Living Arrangements: Spouse/significant other (Lived with wife and  grandson.) Available Help at Discharge: Family, Available 24 hours/day Type of Home: Mobile home (single wide mobile home) Home Access: Stairs to enter [employer] of Steps: 5-6 step entry Home Layout: One level  Lives With: Spouse, Family   Functional History: Prior Function Level of Independence: Independent Comments: Used no device  Functional Status:  Mobility:  min to mod assist for bed mobility. Min assist for sit to stand.  Min assist for ambulating 10- 12' with rest breaks.   ADL:    Cognition: Cognition Overall Cognitive Status: Within Functional Limits for tasks assessed Orientation Level: Oriented X4 Cognition Overall Cognitive Status: Within Functional Limits for tasks assessed  Height 5\' 10"  (1.778 m), weight 73.2 kg (161 lb 6.4 oz). Physical Exam  Nursing note and vitals reviewed. Constitutional: He is oriented to person, place, and time. He appears well-developed and well-nourished. He has a sickly appearance. Nasal cannula in place.  Pursed lip breathing at times.   HENT:  Head: Normocephalic and atraumatic.  Mouth/Throat: Oropharynx is clear and moist.  Wear dentures  Eyes: Pupils are equal, round, and reactive to light.  Eye lids/brows crusted. Left ectropion and injected sclera. .    Neck: Normal range of motion. Neck supple.  Dry dressing on neck  Cardiovascular: Normal  rate.  An irregular rhythm present.  Respiratory: Effort normal. No stridor. No respiratory distress. He has decreased breath sounds. He has wheezes. He exhibits tenderness (left chest wall).  GI: Soft. Bowel sounds are normal. He exhibits distension. There is no tenderness.  Neurological: He is alert and oriented to person, place, and time.  Able to follow basic command without difficulty.   Skin: Skin is warm and dry.  Scattered flaky plaques noted.   Psychiatric: His mood appears anxious.    Lab Results Last 48 Hours  No results found for this or any previous visit (from the past 48 hour(s)).   Imaging Results (Last 48 hours)  No results found.       Medical Problem List and Plan: 1.  Deconditioning and left acetabular fracture secondary to fall, resulting in multiple rib fractures and respiratory failure requiring mechanical ventilation and tracheostomy 2.  DVT Prophylaxis/Anticoagulation: will add  Pharmaceutical: Lovenox--check dopplers in am.  3. Pain Management: Will try to minimize use of opiates. Given his history of alcohol abuse 4. Mood: LCSW to follow for evaluation and support.  5. Neuropsych: This patient is capable of making decisions on his own behalf. 6. Skin/Wound Care: Lubricating cream to eye lids. Steroid cream to psoriatic  plaques? 7. Fluids/Electrolytes/Nutrition: Strict I/O. Check lytes in am.  8. CAD with ICM/CHF: Look for signs of overload/Monitor weight daily. Low salt diet. Continue lasix with parameters--hold lisinopril.  9. A fib: Monitor HR bid. On amiodarone.  10. Hypotension:  BP ranges from  80/90 to 110. Monitor BP bid--will set parameters on lasix. Add TEDs 11. Anemia: due to chronic illness and/or Vit B 12 deficiency. Will check anemia panel--MCV 105.  12. Hepatic steatosis/ Alcohol abuse: LFTs/ platelets stable.  13. COPD/ Pulmonary fibrosis: Respiratory status compounded by multiple left rib fractures/left chest contusion. Continue  duonebs, elliptra, and     Post Admission Physician Evaluation: 1. Functional deficits secondary  to left acetabular fracture and deconditioning. 2. Patient is admitted to receive collaborative, interdisciplinary care between the physiatrist, rehab nursing staff, and therapy team. 3. Patient's level of medical complexity and substantial therapy needs in context of that medical necessity cannot be provided at a lesser intensity of  care such as a SNF. 4. Patient has experienced substantial functional loss from his/her baseline which was documented above under the "Functional History" and "Functional Status" headings.  Judging by the patient's diagnosis, physical exam, and functional history, the patient has potential for functional progress which will result in measurable gains while on inpatient rehab.  These gains will be of substantial and practical use upon discharge  in facilitating mobility and self-care at the household level. 5. Physiatrist will provide 24 hour management of medical needs as well as oversight of the therapy plan/treatment and provide guidance as appropriate regarding the interaction of the two. 6. 24 hour rehab nursing will assist with bladder management, bowel management, safety, skin/wound care, disease management, medication administration, pain management and patient education  and help integrate therapy concepts, techniques,education, etc. 7. PT will assess and treat for/with: pre gait, gait training, endurance , safety, equipment, neuromuscular re education.   Goals are: Mod I/ Sup. 8. OT will assess and treat for/with: ADLs, Cognitive perceptual skills, Neuromuscular re education, safety, endurance, equipment.   Goals are: Mod I/ Sup. Therapy may proceed with showering this patient. 9. SLP will assess and treat for/with: eval cognition .  Goals are: Mod I med management and household management. 10. Case Management and Social Worker will assess and treat for  psychological issues and discharge planning. 11. Team conference will be held weekly to assess progress toward goals and to determine barriers to discharge. 12. Patient will receive at least 3 hours of therapy per day at least 5 days per week. 13. ELOS: 18-22d       14. Prognosis:  good     Charlett Blake M.D. [facility] FAAPM&R (Sports Med, Neuromuscular Med) Diplomate [place] Board of Electrodiagnostic Med  02/15/2016

## 2016-02-21 ENCOUNTER — Inpatient Hospital Stay (HOSPITAL_COMMUNITY): Payer: Medicare Other | Admitting: Occupational Therapy

## 2016-02-21 ENCOUNTER — Inpatient Hospital Stay (HOSPITAL_COMMUNITY): Payer: Medicare Other | Admitting: Speech Pathology

## 2016-02-21 ENCOUNTER — Inpatient Hospital Stay (HOSPITAL_COMMUNITY): Payer: Medicare Other

## 2016-02-21 DIAGNOSIS — I509 Heart failure, unspecified: Secondary | ICD-10-CM

## 2016-02-21 LAB — BASIC METABOLIC PANEL
Anion gap: 7 (ref 5–15)
BUN: 20 mg/dL (ref 6–20)
CHLORIDE: 98 mmol/L — AB (ref 101–111)
CO2: 30 mmol/L (ref 22–32)
CREATININE: 0.81 mg/dL (ref 0.61–1.24)
Calcium: 9.3 mg/dL (ref 8.9–10.3)
GFR calc Af Amer: >60 mL/min (ref >60)
Glucose, Bld: 105 mg/dL — ABNORMAL HIGH (ref 65–99)
Potassium: 4.2 mmol/L (ref 3.5–5.1)
SODIUM: 135 mmol/L (ref 135–145)

## 2016-02-21 MED ORDER — OXYCODONE HCL 5 MG PO TABS: 5.0000 mg | ORAL_TABLET | Freq: Two times a day (BID) | ORAL | Status: DC | PRN

## 2016-02-21 MED ORDER — OXYCODONE HCL 5 MG PO TABS
2.5000 mg | ORAL_TABLET | Freq: Two times a day (BID) | ORAL | Status: DC | PRN
  Administered 2016-02-21 – 2016-02-23 (×3): 2.5 mg via ORAL
  Filled 2016-02-21 (×3): qty 1

## 2016-02-21 MED ORDER — LISINOPRIL 2.5 MG PO TABS
2.5000 mg | ORAL_TABLET | Freq: Every day | ORAL | Status: DC
  Administered 2016-02-22: 2.5 mg via ORAL
  Filled 2016-02-21: qty 1

## 2016-02-21 MED ORDER — ACETAMINOPHEN 325 MG PO TABS: 325.0000 mg | ORAL_TABLET | Freq: Four times a day (QID) | ORAL | Status: AC | PRN

## 2016-02-21 NOTE — Progress Notes (Signed)
Social Work Patient ID: Chad Rogers, male   DOB: January 01, 1936, 80 y.o.   MRN: [id number]  Met with pt and spoke with Marcy-daughter via telephone to discuss team conference goals-supervision level and discharge 10/6. Both are very pleased with his progress and pt reports it is [birth year] today. Daughter is very pleased with his progress and someone will always be there with him at home. Will work toward discharge Friday.

## 2016-02-21 NOTE — Progress Notes (Signed)
Speech Language Pathology Daily Session Note  Patient Details  Name: Chad Rogers MRN: [id number] Date of Birth: [birth year]  Today's Date: 02/21/2016 SLP Individual Time: 1100-1200 SLP Individual Time Calculation (min): 60 min   Short Term Goals: Week 1: SLP Short Term Goal 1 (Week 1): STG=LTG due to ELOS   Skilled Therapeutic Interventions:Pt seen for skilled SLP therapy with focus on cognitive goals. Pt able to provide limited information re: medications that he was taking at home although he reports that he manages them independently. Reviewed current medications and discussed consideration for a medication box with assistance upon return hom. Pt was reluctant to accept that supervision for high-risk cognitive activities would be an important requirement to insure safety upon return home.    Function:  Eating Eating                 Cognition Comprehension Comprehension assist level: Follows basic conversation/direction with extra time/assistive device  Expression   Expression assist level: Expresses basic needs/ideas: With no assist  Social Interaction Social Interaction assist level: Interacts appropriately with others with medication or extra time (anti-anxiety, antidepressant).  Problem Solving Problem solving assist level: Solves basic 75 - 89% of the time/requires cueing 10 - 24% of the time  Memory Memory assist level: Recognizes or recalls 75 - 89% of the time/requires cueing 10 - 24% of the time    Pain Pain Assessment Pain Assessment: No/denies pain  Therapy/Group: Individual Therapy  Vinetta Bergamo MA, CCC-SLP 02/21/2016, 4:00 PM

## 2016-02-21 NOTE — Progress Notes (Signed)
Occupational Therapy Session Note  Patient Details  Name: Chad Rogers MRN: [id number] Date of Birth: 03-Apr-1936  Today's Date: 02/21/2016 OT Individual Time: 1300-1400 OT Individual Time Calculation (min): 60 min   Short Term Goals: Week 1:  OT Short Term Goal 1 (Week 1): Pt will be able to ambulate to toilet with LRD with min A. OT Short Term Goal 2 (Week 1): Pt will demonstrate improved activity tolerance to stand at sink for 1 min to wash face, brush hair. OT Short Term Goal 3 (Week 1): Pt will don pants with steadying A when standing. OT Short Term Goal 4 (Week 1): Pt will demonstrate improved activity tolerance to tolerate a shower in sitting.  Skilled Therapeutic Interventions/Progress Updates:   1:1 OT session focused on LB ADLs, activity tolerance, standing endurance, and shower transfers. Pt donned shoes with set-up and Min A to tie R shoe. Abdominal binder removed for therapy- asymptomatic throughout session.  He then ambulated to therapy gym with one seated rest break and close supervision. Pt required min VC for walker safety awareness. SpO2 remained above 95% on room air. Pt completed simulated shower stall transfer with CGA after OT demonstration. Pt then participated in standing activities in gym. Pt tolerated standing activity for ~ 4 minutes at longest standing bout. Pt returned to room at end of session and returned to supine in bed with supervision. RN notified.  Therapy Documentation Precautions:  Precautions Precautions: Fall Precaution Comments: monitor BP Restrictions Weight Bearing Restrictions: Yes LLE Weight Bearing: Weight bearing as tolerated Pain: Pain Assessment Pain Assessment: No/denies pain ADL: ADL ADL Comments: refer to functional navigator Exercises:   Other Treatments:    See Function Navigator for Current Functional Status.   Therapy/Group: Individual Therapy  Valma Cava 02/21/2016, 2:33 PM

## 2016-02-21 NOTE — Progress Notes (Signed)
Physical Therapy Session Note  Patient Details  Name: Chad Rogers MRN: [id number] Date of Birth: 1936-01-19  Today's Date: 02/21/2016 PT Individual Time: 0900-1015 PT Individual Time Calculation (min): 75 min    Short Term Goals: Week 1:  PT Short Term Goal 1 (Week 1): Patient will perform stand pivot transfers with close supervision. PT Short Term Goal 2 (Week 1): Patient will ambulate 30 ft with min A.  PT Short Term Goal 3 (Week 1): Patient will initiate stair training. PT Short Term Goal 4 (Week 1): Patient will propel wheelchair 150 ft using BUE for strengthening and endurance.   Skilled Therapeutic Interventions/Progress Updates:    Session focused on overall endurance/activity tolerance, stair negotiation with R rail only to simulate home access, gait training, simulated car transfer, w/c propulsion for strengthening and endurance, gait over ramped surface to simulate community mobility with RW with supervision for safety, and d/c planning discussed. Pt overall supervision to steadying assist for functional mobility. Cues during gait to maintain body position inside RW for safety and improved upright posture. Completed 75% of session without supplemental O2 and sats remained > 92% with all activity. Pt demonstrating improved activity tolerance overall. Plans to speak with wife about RW access at home for d/c otherwise pt will need one ordered. Denies concerns with d/c and discussed follow up therapy options.   Therapy Documentation Precautions:  Precautions Precautions: Fall Precaution Comments: monitor BP Restrictions Weight Bearing Restrictions: Yes LLE Weight Bearing: Weight bearing as tolerated  Pain: Denies pain.    See Function Navigator for Current Functional Status.   Therapy/Group: Individual Therapy  Canary Brim Ivory Broad, PT, DPT  02/21/2016, 11:18 AM

## 2016-02-21 NOTE — Progress Notes (Signed)
Subjective/Complaints: No SOB last noc, slept well, participating in Cognitive tasks with SLP this am  ROS: Pt denies fever, rash/itching, headache, blurred or double vision, nausea, vomiting, abdominal pain, diarrhea, chest pain, shortness of breath, palpitations, dysuria, dizziness, neck or back pain, bleeding, anxiety, or depression  Objective: Vital Signs: Blood pressure (!) 93/55, pulse (!) 102, temperature 98.5 F (36.9 C), temperature source Oral, resp. rate 17, height 5' 11"  (1.803 m), weight 66.2 kg (146 lb), SpO2 98 %. No results found. Results for orders placed or performed during the hospital encounter of 02/15/16 (from the past 72 hour(s))  Basic metabolic panel     Status: Abnormal   Collection Time: 02/19/16  2:40 PM  Result Value Ref Range   Sodium 129 (L) 135 - 145 mmol/L   Potassium 3.4 (L) 3.5 - 5.1 mmol/L   Chloride 93 (L) 101 - 111 mmol/L   CO2 27 22 - 32 mmol/L   Glucose, Bld 152 (H) 65 - 99 mg/dL   BUN 30 (H) 6 - 20 mg/dL   Creatinine, Ser 1.08 0.61 - 1.24 mg/dL   Calcium 8.9 8.9 - 10.3 mg/dL   GFR calc non Af Amer >60 >60 mL/min   GFR calc Af Amer >60 >60 mL/min    Comment: (NOTE) The eGFR has been calculated using the CKD EPI equation. This calculation has not been validated in all clinical situations. eGFR's persistently <60 mL/min signify possible Chronic Kidney Disease.    Anion gap 9 5 - 15  Basic metabolic panel     Status: Abnormal   Collection Time: 02/21/16  6:33 AM  Result Value Ref Range   Sodium 135 135 - 145 mmol/L   Potassium 4.2 3.5 - 5.1 mmol/L   Chloride 98 (L) 101 - 111 mmol/L   CO2 30 22 - 32 mmol/L   Glucose, Bld 105 (H) 65 - 99 mg/dL   BUN 20 6 - 20 mg/dL   Creatinine, Ser 0.81 0.61 - 1.24 mg/dL   Calcium 9.3 8.9 - 10.3 mg/dL   GFR calc non Af Amer >60 >60 mL/min   GFR calc Af Amer >60 >60 mL/min    Comment: (NOTE) The eGFR has been calculated using the CKD EPI equation. This calculation has not been validated in all  clinical situations. eGFR's persistently <60 mL/min signify possible Chronic Kidney Disease.    Anion gap 7 5 - 15     HEENT: [place] in nares Cardio: RRR and no murmur Resp: CTA B/L and unlabored. Oxygen via Ages GI: BS positive and psoriatic plaques, face and forearms Extremity:  BS positive and NT, ND Skin:   Other psoriatic plaques face and forearm Neuro: Alert/Oriented and Abnormal Motor 4/5 in BUE and BLE Musc/Skel:  Other restricted ROM B shoulder and hips and knees Gen NAD, frail appearing.    Assessment/Plan: 1. Functional deficits secondary to Deconditioning multiple medical and Left acetabular fx which require 3+ hours per day of interdisciplinary therapy in a comprehensive inpatient rehab setting. Physiatrist is providing close team supervision and 24 hour management of active medical problems listed below. Physiatrist and rehab team continue to assess barriers to discharge/monitor patient progress toward functional and medical goals. FIM: Function - Bathing Position: Wheelchair/chair at sink Body parts bathed by patient: Right arm, Left arm, Chest, Abdomen, Front perineal area, Buttocks, Right upper leg, Left upper leg Body parts bathed by helper: Back Assist Level: Supervision or verbal cues  Function- Upper Body Dressing/Undressing What is the patient wearing?: Pull  over shirt/dress Pull over shirt/dress - Perfomed by patient: Thread/unthread left sleeve, Thread/unthread right sleeve, Put head through opening, Pull shirt over trunk Assist Level: Supervision or verbal cues Function - Lower Body Dressing/Undressing What is the patient wearing?: Pants, Underwear, Shoes, Socks Position: Wheelchair/chair at [employer] - Performed by patient: Thread/unthread right underwear leg, Thread/unthread left underwear leg, Pull underwear up/down Pants- Performed by patient: Thread/unthread right pants leg, Thread/unthread left pants leg, Pull pants up/down Pants- Performed by  helper: Thread/unthread right pants leg, Thread/unthread left pants leg Socks - Performed by patient: Don/doff right sock, Don/doff left sock Socks - Performed by helper: Don/doff right sock, Don/doff left sock Shoes - Performed by patient: Don/doff right shoe, Don/doff left shoe Shoes - Performed by helper: Fasten left, Fasten right TED Hose - Performed by helper: Don/doff left TED hose, Don/doff right TED hose Assist for footwear: Partial/moderate assist Assist for lower body dressing: Touching or steadying assistance (Pt > 75%)  Function - Toileting Toileting steps completed by patient: Adjust clothing prior to toileting, Performs perineal hygiene, Adjust clothing after toileting Toileting steps completed by helper: Performs perineal hygiene (per Courtney Paris, NT) Toileting Assistive Devices: Grab bar or rail Assist level: Supervision or verbal cues  Function - Air cabin crew transfer assistive device: Grab bar Assist level to toilet: Moderate assist (Pt 50 - 74%/lift or lower) Assist level from toilet: Moderate assist (Pt 50 - 74%/lift or lower) Assist level to bedside commode (at bedside): Moderate assist (Pt 50 - 74%/lift or lower) (per Elmo Putt, NT) Assist level from bedside commode (at bedside): Moderate assist (Pt 50 - 74%/lift or lower)  Function - Chair/bed transfer Chair/bed transfer method: Stand pivot Chair/bed transfer assist level: Touching or steadying assistance (Pt > 75%) Chair/bed transfer assistive device: Armrests  Function - Locomotion: Wheelchair Will patient use wheelchair at discharge?:  (TBD) Type: Manual Max wheelchair distance: 200' Assist Level: Supervision or verbal cues Assist Level: Supervision or verbal cues Assist Level: Supervision or verbal cues Turns around,maneuvers to table,bed, and toilet,negotiates 3% grade,maneuvers on rugs and over doorsills: No Function - Locomotion: Ambulation Assistive device: Walker-rolling Max  distance: 125' Assist level: Touching or steadying assistance (Pt > 75%) Assist level: Touching or steadying assistance (Pt > 75%) Walk 50 feet with 2 turns activity did not occur: Safety/medical concerns (fatigue/low BP) Assist level: Touching or steadying assistance (Pt > 75%) Walk 150 feet activity did not occur: Safety/medical concerns Walk 10 feet on uneven surfaces activity did not occur: Safety/medical concerns Assist level: Moderate assist (Pt 50 - 74%)  Function - Comprehension Comprehension: Auditory Comprehension assist level: Follows basic conversation/direction with no assist  Function - Expression Expression: Verbal Expression assist level: Expresses basic needs/ideas: With no assist  Function - Social Interaction Social Interaction assist level: Interacts appropriately 90% of the time - Needs monitoring or encouragement for participation or interaction.  Function - Problem Solving Problem solving assist level: Solves basic 75 - 89% of the time/requires cueing 10 - 24% of the time  Function - Memory Memory assist level: Recognizes or recalls 75 - 89% of the time/requires cueing 10 - 24% of the time Patient normally able to recall (first 3 days only): Current season, Staff names and faces, That he or she is in a hospital  Medical Problem List and Plan: 1.  Deconditioning and left acetabular fracture secondary to fall, resulting in multiple rib fractures and respiratory failure requiring mechanical ventilation and tracheostomy  -continue CIR PT, OT, speech, Team conference today please  see physician documentation under team conference tab, met with team face-to-face to discuss problems,progress, and goals. Formulized individual treatment plan based on medical history, underlying problem and comorbidities. 2.  DVT Prophylaxis/Anticoagulation: will add  Pharmaceutical: Lovenox--dopplers negative  3. Pain Management: try to minimize use of opiates. Given his history of alcohol  abuse 4. Mood: LCSW to follow for evaluation and support.  5. Neuropsych: This patient is capable of making decisions on his own behalf. 6. Skin/Wound Care: Lubricating cream to eye lids. Steroid cream to psoriatic  plaques? 7. Fluids/Electrolytes/Nutrition: Strict I/O. Encourage po  8. CAD with ICM/CHF: No signs of overload  -. Low salt diet. Reduce lasix 20 mg twice a day monitor for fluid retention-hold lisinopril.  --1732m neg fluid balance 10/3  9. A fib: Monitor HR bid. Mild tachy likely from deconditioning Vitals:   02/20/16 1417 02/21/16 0428  BP: (!) 97/54 (!) 93/55  Pulse: (!) 110 (!) 102  Resp: 17   Temp: 97.8 F (36.6 C) 98.5 F (36.9 C)   10. Hypotension:  BP ranges from  80/90 to 110. Monitor BP bid--will set parameters on lasix. Add TEDs 11. Anemia: due to chronic illness and/or Vit B 12 deficiency.  anemia panel--MCV 105.  12. Hepatic steatosis/ Alcohol abuse: LFTs/ platelets stable.  13. COPD/ Pulmonary fibrosis: Respiratory status compounded by multiple left rib fractures/left chest contusion. Continue duonebs, elliptra, and  Restrictive lung disease SLP for resp muscle training 14. Hyponatremia improved to 135 today, on reduced dose of lasix LOS (Days) 6 A FACE TO FACE EVALUATION WAS PERFORMED  KIRSTEINS,ANDREW E 02/21/2016, 8:53 AM

## 2016-02-21 NOTE — Patient Care Conference (Signed)
Inpatient RehabilitationTeam Conference and Plan of Care Update Date: 02/21/2016   Time: 10:50 AM    Patient Name: Chad Rogers      Medical Record Number: [id number]  Date of Birth: 02-16-1936 Sex: Male         Room/Bed: 4W25C/4W25C-01 Payor Info: Payor: [employer] MEDICARE / Plan: [employer] MEDICARE / Product Type: *No Product type* /    Admitting Diagnosis: VDRF  Admit Date/Time:  02/15/2016  4:30 PM Admission Comments: No comment available   Primary Diagnosis:  <principal problem not specified> Principal Problem: <principal problem not specified>  Patient Active Problem List   Diagnosis Date Noted  . Debility 02/15/2016  . Atrial fibrillation, persistent ([place]) 01/15/2016  . Absent pulse in lower extremity   . Acute on chronic systolic CHF (congestive heart failure) ([place]) 01/01/2016  . NSVT (nonsustained ventricular tachycardia) ([place]) 01/01/2016  . Respiratory failure ([place])   . Traumatic fracture of ribs with pneumothorax 12/29/2015  . Multiple rib fractures 12/29/2015  . Cardiomyopathy ([place])   . Hypertension   . Pulmonary fibrosis ([place])   . Osteoarthritis   . Tobacco abuse     Expected Discharge Date: Expected Discharge Date: 02/23/16  Team Members Present: Physician leading conference: Dr. Alysia Penna Social Worker Present: Ovidio Kin, LCSW Nurse Present: Heather Roberts, RN PT Present: Canary Brim, PT OT Present: Willeen Cass, OT;Other (comment) Grayland Ormond DOe-OT) SLP Present: Windell Moulding, SLP PPS Coordinator present : Daiva Nakayama, RN, CRRN     Current Status/Progress Goal Weekly Team Focus  Medical   improved oxygenation, improved endurance  maintain optimal fluid balance  reduce lasix   Bowel/Bladder   continent of bowel and bladder. Last BM on 02/20/16  Remain continent while in rehab  monitor bladder and bowel function   Swallow/Nutrition/ Hydration             ADL's   Min A overall   supervision overall  activity tolerance, bahting/dressing  modifications, standing balance   Mobility   min assist overall  supervision overall  endurance, strengthening, gait, balance, stairs   Communication             Safety/Cognition/ Behavioral Observations  min assist for basic   supervision for basic   basic familiar problem solving, use of memory compensatory strategies, awareness of deficits    Pain   Pain medication as needed, oxycodone 5 mg Q 8hourly as needed  No pain/ <2  Assess and treat for pain   Skin                *See Care Plan and progress notes for long and short-term goals.  Barriers to Discharge: hx of ETOH, hx of heavy smoking    Possible Resolutions to Barriers:  no     Discharge Planning/Teaching Needs:    Home with wife and children to be in and out. Very supportive family who will assist with his care.     Team Discussion:  Goals supervision level, weaning off O2 and doing well. Working on endurance issues and balance. Reducing lasix due to output too much. BP chronically low. Pt wanting to go home.  Revisions to Treatment Plan:  None   Continued Need for Acute Rehabilitation Level of Care: The patient requires daily medical management by a physician with specialized training in physical medicine and rehabilitation for the following conditions: Daily direction of a multidisciplinary physical rehabilitation program to ensure safe treatment while eliciting the highest outcome that is of practical value to the  patient.: Yes Daily medical management of patient stability for increased activity during participation in an intensive rehabilitation regime.: Yes Daily analysis of laboratory values and/or radiology reports with any subsequent need for medication adjustment of medical intervention for : Pulmonary problems;Neurological problems  Elease Hashimoto 02/21/2016, 2:42 PM

## 2016-02-22 ENCOUNTER — Inpatient Hospital Stay (HOSPITAL_COMMUNITY): Payer: Medicare Other | Admitting: Occupational Therapy

## 2016-02-22 ENCOUNTER — Inpatient Hospital Stay (HOSPITAL_COMMUNITY): Payer: Medicare Other

## 2016-02-22 ENCOUNTER — Inpatient Hospital Stay (HOSPITAL_COMMUNITY): Payer: Medicare Other | Admitting: Speech Pathology

## 2016-02-22 NOTE — Progress Notes (Signed)
Speech Language Pathology Discharge Summary  Patient Details  Name: Chad Rogers MRN: [id number] Date of Birth: February 25, 1936  Today's Date: 02/22/2016 SLP Individual Time: 1400-1440 SLP Individual Time Calculation (min): 40 min    Skilled Therapeutic Interventions:  Pt was seen for skilled ST targeting cognitive goals and grad day activities.  Pt ambulated to the day room with distant supervision cues for safe walker use and recall of directional cues.  SLP re-administered the MoCA to measure progress from initial evaluation.  Pt scored 24/30 (n>/=26) with improvements noted on executive functioning subtests.  Therapist recommended that pt have assistance for medication and financial management at discharge given ongoing cognitive impairment.  PT verbalized understanding and agreement with recommendation and stated that he will have multiple family members who will be able to help him.  Pt recalled route from day room to his room with mod I.  Pt was left sitting at edge of bed with call bell within reach and bed alarm set.  Pt education is complete at this time.  Pt is ready for discharge tomorrow.     Patient has met 4 of 4 long term goals.  Patient to discharge at overall Supervision level.  Reasons goals not met:     Clinical Impression/Discharge Summary:  Pt made functional gains and is discharging at an overall supervision level assist.  He has met 4 out of 4 short term goals and has demonstrated improved sustained attention to tasks, improved functional problem solving, improved recall of daily information, and improved safety awareness.  Pt is discharging home with 24/7 supervision and assistance from family.  Pt education is complete at this time.  Pt may benefit from brief ST follow up at next level of care if mentation does not clear further in the home environment.    Care Partner:  Caregiver Able to Provide Assistance: Yes  Type of Caregiver Assistance:  Physical;Cognitive  Recommendation:  Home Health SLP;Outpatient SLP;24 hour supervision/assistance  Rationale for SLP Follow Up: Maximize cognitive function and independence;Reduce caregiver burden   Equipment: none recommended by SLP    Reasons for discharge: Discharged from hospital   Patient/Family Agrees with Progress Made and Goals Achieved: No   Function:  Eating Eating                 Cognition Comprehension Comprehension assist level: Follows basic conversation/direction with extra time/assistive device  Expression   Expression assist level: Expresses basic needs/ideas: With no assist  Social Interaction Social Interaction assist level: Interacts appropriately with others with medication or extra time (anti-anxiety, antidepressant).  Problem Solving Problem solving assist level: Solves basic 90% of the time/requires cueing < 10% of the time  Memory Memory assist level: Recognizes or recalls 90% of the time/requires cueing < 10% of the time   Emilio Math 02/22/2016, 3:45 PM

## 2016-02-22 NOTE — Progress Notes (Signed)
Occupational Therapy Session Note  Patient Details  Name: Chad Rogers MRN: [id number] Date of Birth: February 06, 1936  Today's Date: 02/22/2016 OT Individual Time: 1100-1130 OT Individual Time Calculation (min): 30 min     Short Term Goals: Week 1:  OT Short Term Goal 1 (Week 1): Pt will be able to ambulate to toilet with LRD with min A. OT Short Term Goal 2 (Week 1): Pt will demonstrate improved activity tolerance to stand at sink for 1 min to wash face, brush hair. OT Short Term Goal 3 (Week 1): Pt will don pants with steadying A when standing. OT Short Term Goal 4 (Week 1): Pt will demonstrate improved activity tolerance to tolerate a shower in sitting.  Skilled Therapeutic Interventions/Progress Updates:   1:1 Performed his HEP of 5 exercises of Level 1 OTAGO fall prevention exercises. Pt stood at foot of bed using bed rail for UE support. Pt able to perform 10 rep of each for each LE. Pt did required long extended rest break in the middle of program to sit and rest. Supervision for bed mobility in and out of bed in prep for session and at end of session.   Therapy Documentation Precautions:  Precautions Precautions: Fall Precaution Comments: monitor BP Restrictions Weight Bearing Restrictions: Yes LLE Weight Bearing: Weight bearing as tolerated Pain:  no c/o pain   See Function Navigator for Current Functional Status.   Therapy/Group: Individual Therapy  Chad Rogers [facility] 02/22/2016, 3:39 PM

## 2016-02-22 NOTE — Progress Notes (Signed)
Subjective/Complaints: Pt amb with PT this am using walker and sup Per PT may be ready for mod I in room  ROS: Pt denies fever, rash/itching, headache, blurred or double vision, nausea, vomiting, abdominal pain, diarrhea, chest pain, shortness of breath, palpitations, dysuria, dizziness, neck or back pain, bleeding, anxiety, or depression  Objective: Vital Signs: Blood pressure (!) 94/57, pulse (!) 104, temperature 97.8 F (36.6 C), temperature source Oral, resp. rate 20, height 5' 11" (1.803 m), weight 64.9 kg (143 lb), SpO2 95 %. No results found. Results for orders placed or performed during the hospital encounter of 02/15/16 (from the past 72 hour(s))  Basic metabolic panel     Status: Abnormal   Collection Time: 02/19/16  2:40 PM  Result Value Ref Range   Sodium 129 (L) 135 - 145 mmol/L   Potassium 3.4 (L) 3.5 - 5.1 mmol/L   Chloride 93 (L) 101 - 111 mmol/L   CO2 27 22 - 32 mmol/L   Glucose, Bld 152 (H) 65 - 99 mg/dL   BUN 30 (H) 6 - 20 mg/dL   Creatinine, Ser 1.08 0.61 - 1.24 mg/dL   Calcium 8.9 8.9 - 10.3 mg/dL   GFR calc non Af Amer >60 >60 mL/min   GFR calc Af Amer >60 >60 mL/min    Comment: (NOTE) The eGFR has been calculated using the CKD EPI equation. This calculation has not been validated in all clinical situations. eGFR's persistently <60 mL/min signify possible Chronic Kidney Disease.    Anion gap 9 5 - 15  Basic metabolic panel     Status: Abnormal   Collection Time: 02/21/16  6:33 AM  Result Value Ref Range   Sodium 135 135 - 145 mmol/L   Potassium 4.2 3.5 - 5.1 mmol/L   Chloride 98 (L) 101 - 111 mmol/L   CO2 30 22 - 32 mmol/L   Glucose, Bld 105 (H) 65 - 99 mg/dL   BUN 20 6 - 20 mg/dL   Creatinine, Ser 0.81 0.61 - 1.24 mg/dL   Calcium 9.3 8.9 - 10.3 mg/dL   GFR calc non Af Amer >60 >60 mL/min   GFR calc Af Amer >60 >60 mL/min    Comment: (NOTE) The eGFR has been calculated using the CKD EPI equation. This calculation has not been validated in all  clinical situations. eGFR's persistently <60 mL/min signify possible Chronic Kidney Disease.    Anion gap 7 5 - 15     HEENT: [place] in nares Cardio: RRR and no murmur Resp: CTA B/L and unlabored. Oxygen via [place] GI: BS positive and psoriatic plaques, face and forearms Extremity:  BS positive and NT, ND Skin:   Other psoriatic plaques face and forearm Neuro: Alert/Oriented and Abnormal Motor 4/5 in BUE and BLE Musc/Skel:  Other restricted ROM B shoulder and hips and knees Gen NAD, frail appearing.    Assessment/Plan: 1. Functional deficits secondary to Deconditioning multiple medical and Left acetabular fx which require 3+ hours per day of interdisciplinary therapy in a comprehensive inpatient rehab setting. Physiatrist is providing close team supervision and 24 hour management of active medical problems listed below. Physiatrist and rehab team continue to assess barriers to discharge/monitor patient progress toward functional and medical goals. FIM: Function - Bathing Position: Wheelchair/chair at sink Body parts bathed by patient: Right arm, Left arm, Chest, Abdomen, Front perineal area, Buttocks, Right upper leg, Left upper leg Body parts bathed by helper: Back Assist Level: Supervision or verbal cues  Function- Upper Body Dressing/Undressing  What is the patient wearing?: Pull over shirt/dress Pull over shirt/dress - Perfomed by patient: Thread/unthread left sleeve, Thread/unthread right sleeve, Put head through opening, Pull shirt over trunk Assist Level: Supervision or verbal cues Function - Lower Body Dressing/Undressing What is the patient wearing?: Socks, Shoes Position: Sitting EOB Underwear - Performed by patient: Thread/unthread right underwear leg, Thread/unthread left underwear leg, Pull underwear up/down Pants- Performed by patient: Thread/unthread right pants leg, Thread/unthread left pants leg, Pull pants up/down Pants- Performed by helper: Thread/unthread right pants  leg, Thread/unthread left pants leg Socks - Performed by patient: Don/doff right sock, Don/doff left sock Socks - Performed by helper: Don/doff right sock, Don/doff left sock Shoes - Performed by patient: Don/doff left shoe, Don/doff right shoe, Fasten right Shoes - Performed by helper: Fasten left TED Hose - Performed by helper: Don/doff left TED hose, Don/doff right TED hose Assist for footwear: Supervision/touching assist Assist for lower body dressing: Supervision or verbal cues  Function - Toileting Toileting steps completed by patient: Adjust clothing prior to toileting, Performs perineal hygiene, Adjust clothing after toileting Toileting steps completed by helper: Performs perineal hygiene (per Courtney Paris, NT) Toileting Assistive Devices: Grab bar or rail Assist level: Supervision or verbal cues  Function - Air cabin crew transfer assistive device: Grab bar Assist level to toilet: Moderate assist (Pt 50 - 74%/lift or lower) Assist level from toilet: Moderate assist (Pt 50 - 74%/lift or lower) Assist level to bedside commode (at bedside): Moderate assist (Pt 50 - 74%/lift or lower) (per Elmo Putt, NT) Assist level from bedside commode (at bedside): Moderate assist (Pt 50 - 74%/lift or lower)  Function - Chair/bed transfer Chair/bed transfer method: Stand pivot Chair/bed transfer assist level: Touching or steadying assistance (Pt > 75%) Chair/bed transfer assistive device: Armrests  Function - Locomotion: Wheelchair Will patient use wheelchair at discharge?:  (TBD) Type: Manual Max wheelchair distance: 200' Assist Level: Supervision or verbal cues Assist Level: Supervision or verbal cues Assist Level: Supervision or verbal cues Turns around,maneuvers to table,bed, and toilet,negotiates 3% grade,maneuvers on rugs and over doorsills: No Function - Locomotion: Ambulation Assistive device: Walker-rolling Max distance: 200' Assist level: Touching or steadying  assistance (Pt > 75%) Assist level: Supervision or verbal cues Walk 50 feet with 2 turns activity did not occur: Safety/medical concerns (fatigue/low BP) Assist level: Supervision or verbal cues Walk 150 feet activity did not occur: Safety/medical concerns Assist level: Touching or steadying assistance (Pt > 75%) Walk 10 feet on uneven surfaces activity did not occur: Safety/medical concerns Assist level: Touching or steadying assistance (Pt > 75%)  Function - Comprehension Comprehension: Auditory Comprehension assist level: Follows basic conversation/direction with extra time/assistive device  Function - Expression Expression: Verbal Expression assist level: Expresses basic needs/ideas: With no assist  Function - Social Interaction Social Interaction assist level: Interacts appropriately with others with medication or extra time (anti-anxiety, antidepressant).  Function - Problem Solving Problem solving assist level: Solves basic 75 - 89% of the time/requires cueing 10 - 24% of the time  Function - Memory Memory assist level: Recognizes or recalls 75 - 89% of the time/requires cueing 10 - 24% of the time Patient normally able to recall (first 3 days only): Current season, Staff names and faces, That he or she is in a hospital  Medical Problem List and Plan: 1.  Deconditioning and left acetabular fracture secondary to fall, resulting in multiple rib fractures and respiratory failure requiring mechanical ventilation and tracheostomy  -continue CIR PT, OT, speech, plan d/c in  am with PCP, ortho, cardiology f/u 2.  DVT Prophylaxis/Anticoagulation: will add  Pharmaceutical: Lovenox--dopplers negative  3. Pain Management: try to minimize use of opiates. Given his history of alcohol abuse 4. Mood: LCSW to follow for evaluation and support.  5. Neuropsych: This patient is capable of making decisions on his own behalf. 6. Skin/Wound Care: Lubricating cream to eye lids. Steroid cream to  psoriatic  plaques? 7. Fluids/Electrolytes/Nutrition: Strict I/O. Encourage po  8. CAD with ICM/CHF: No signs of overload  -. Low salt diet. Reduce lasix 20 mg twice a day monitor for fluid retention-hold lisinopril.  --1756m neg fluid balance 10/3  9. A fib: Monitor HR bid. Mild tachy likely from deconditioning Vitals:   02/21/16 1405 02/22/16 0449  BP: 101/60 (!) 94/57  Pulse: 100 (!) 104  Resp: 18 20  Temp: 98.6 F (37 C) 97.8 F (36.6 C)   10. Hypotension:  BP ranges from  80/90 to 110. Monitor BP bid--will set parameters on lasix. Add TEDs 11. Anemia: due to chronic illness and/or Vit B 12 deficiency.  anemia panel--MCV 105.  12. Hepatic steatosis/ Alcohol abuse: LFTs/ platelets stable.  13. COPD/ Pulmonary fibrosis: Respiratory status compounded by multiple left rib fractures/left chest contusion. Continue duonebs, elliptra, and  Restrictive lung disease SLP for resp muscle training 14. Hyponatremia improved to 135 today, on reduced dose of lasix LOS (Days) 7 A FACE TO FACE EVALUATION WAS PERFORMED  , E 02/22/2016, 8:39 AM

## 2016-02-22 NOTE — Progress Notes (Signed)
Occupational Therapy Discharge Summary  Patient Details  Name: Chad Rogers MRN: [id number] Date of Birth: 06/21/1935  Today's Date: 02/22/2016 OT Individual Time: 0800-0900 OT Individual Time Calculation (min): 60 min   Patient has met 6 of 6 long term goals due to improved activity tolerance, improved balance, postural control, improved attention, improved awareness, improved coordination, and increased independence with BADL tasks.  Patient to discharge at overall Supervision level.  Patient's care partner is independent to provide the necessary physical and cognitive assistance at discharge.  Pt has demonstrated improved overall strength and activity tolerance needed to participate in daily self-care tasks at overall supervision level with a RW .   Recommendation:  Patient will benefit from ongoing skilled OT services in home health setting to continue to advance functional skills in the area of BADL.  Equipment: Conservation officer, nature and Bedside Commode  Reasons for discharge: treatment goals met and discharge from hospital  Patient/family agrees with progress made and goals achieved: Yes   OT Discharge Precautions/Restrictions   Fall Vital Signs Therapy Vitals Patient Position (if appropriate): Standing Oxygen Therapy SpO2: 97 % O2 Device: Not Delivered Pulse Oximetry Type: Intermittent Pain Pain Assessment Pain Assessment: No/denies pain ADL ADL ADL Comments: refer to functional navigator Cognition Arousal/Alertness: Awake/alert Orientation Level: Oriented X4 Awareness: Appears intact Sensation Sensation Light Touch: Appears Intact Stereognosis: Appears Intact Hot/Cold: Appears Intact Proprioception: Appears Intact Coordination Fine Motor Movements are Fluid and Coordinated: Yes Motor  Motor Motor: Within Functional Limits Balance Static Standing Balance Static Standing - Balance Support: No upper extremity supported Static Standing - Level of Assistance: 5:  Stand by assistance (supervision) Dynamic Standing Balance Dynamic Standing - Level of Assistance: 5: Stand by assistance (supervision) Extremity/Trunk Assessment RUE Assessment RUE Assessment: Within Functional Limits (limited 69* 2/2 old injury-WFL) LUE Assessment LUE Assessment: Within Functional Limits  OT treatment session: Pt seen for 1:1 OT session focused on ADL/self-care, activity tolerance, standing endurance, and home safety awareness. Pt ambulated with RW to collect clothing, then completed bathing and dressing at the sink with supervision for safety. Pt ambulated to family room with supervision and 1 rest break. SpO2 97*. Pt able to make coffee and maintain standing balance with supervision. He then reported need for bathroom, ambulated to bathroom w. RW and transferred on/off toilet with supervision.    See Function Navigator for Current Functional Status.  [name] [name] 02/22/2016, 9:01 AM

## 2016-02-22 NOTE — Progress Notes (Signed)
Physical Therapy Discharge Summary  Patient Details  Name: Chad Rogers MRN: [id number] Date of Birth: 11-09-1935  Today's Date: 02/22/2016 PT Individual Time: 0900-1015 PT Individual Time Calculation (min): 75 min    Session focused on addressing Grad Day activities and preparation for d/c tomorrow. Pt completed gait training with RW on unit and in home environment at supervision level (cues for positioning of RW for improved posture), stair negotiation training for home entry and community mobility at supervision level, bed mobility and transfers in ADL apartment to simulate home environment, simulated car transfer at supervision level using RW, gait over uneven surfaces to simulate community mobility and challenge balance, and issued [place] for improved strength and balance upon D/C (only able to verbally instruct and demonstrate due to time constraints). Pt requires intermittent cues for positioning of RW during mobility and for upright posture. Pt completed session on room air with sats > 92% and no c/o dizziness (no abdominal binder today). Pt more fatigued today requiring more frequent rest breaks. Educated on energy conservation techniques to utilize at home upon d/c.   Patient has met 8 of 8 long term goals due to improved activity tolerance, improved balance, increased strength, decreased pain and ability to compensate for deficits.  Patient to discharge at an ambulatory level Supervision using RW.   Patient's wife available to provide needed supervision at discharge.  Reasons goals not met: n/a - all goals met at this time.  Recommendation:  Patient will benefit from ongoing skilled PT services in home health setting to continue to advance safe functional mobility, address ongoing impairments in endurance, strength, balance, gait, and minimize fall risk.  Equipment: RW  Reasons for discharge: treatment goals met and discharge from hospital  Patient/family agrees with  progress made and goals achieved: Yes  PT Discharge Precautions/Restrictions Precautions Precautions: Fall Restrictions LLE Weight Bearing: Weight bearing as tolerated Vital Signs O2 on room air = 94% Pain Pain Assessment Pain Assessment: No/denies pain   Sensation Sensation Light Touch: Appears Intact Stereognosis: Appears Intact Hot/Cold: Appears Intact Proprioception: Appears Intact Coordination Gross Motor Movements are Fluid and Coordinated: Yes Fine Motor Movements are Fluid and Coordinated: Yes Motor  Motor Motor: Within Functional Limits Motor - Discharge Observations: generalized deconditioning     Trunk/Postural Assessment  Cervical Assessment Cervical Assessment: Within Functional Limits Thoracic Assessment Thoracic Assessment:  (kyphotic posture) Lumbar Assessment Lumbar Assessment:  (posterior pelvic tilt)  Balance Dynamic Sitting Balance Sitting balance - Comments: mod I Static Standing Balance Static Standing - Balance Support: No upper extremity supported Static Standing - Level of Assistance: 5: Stand by assistance Dynamic Standing Balance Dynamic Standing - Level of Assistance: 5: Stand by assistance Extremity Assessment  RUE Assessment RUE Assessment: Within Functional Limits (limited 55* 2/2 old injury-WFL) LUE Assessment LUE Assessment: Within Functional Limits RLE Assessment RLE Assessment: Within Functional Limits (grossly 4+/5) LLE Assessment LLE Assessment: Within Functional Limits (grossly 4+/5)   See Function Navigator for Current Functional Status.  Canary Brim Ivory Broad, PT, DPT  02/22/2016, 12:25 PM

## 2016-02-23 DIAGNOSIS — J449 Chronic obstructive pulmonary disease, unspecified: Secondary | ICD-10-CM

## 2016-02-23 DIAGNOSIS — E871 Hypo-osmolality and hyponatremia: Secondary | ICD-10-CM

## 2016-02-23 DIAGNOSIS — Z23 Encounter for immunization: Secondary | ICD-10-CM | POA: Diagnosis not present

## 2016-02-23 DIAGNOSIS — I959 Hypotension, unspecified: Secondary | ICD-10-CM

## 2016-02-23 MED ORDER — POTASSIUM CHLORIDE CRYS ER 10 MEQ PO TBCR
10.0000 meq | EXTENDED_RELEASE_TABLET | Freq: Two times a day (BID) | ORAL | 0 refills | Status: AC
Start: 2016-02-23 — End: ?

## 2016-02-23 MED ORDER — FLUTICASONE FUROATE-VILANTEROL 100-25 MCG/INH IN AEPB: 1.0000 | INHALATION_SPRAY | Freq: Every day | RESPIRATORY_TRACT | 0 refills | Status: AC

## 2016-02-23 MED ORDER — PANTOPRAZOLE SODIUM 40 MG PO TBEC: 40.0000 mg | DELAYED_RELEASE_TABLET | Freq: Every day | ORAL | 0 refills | Status: AC

## 2016-02-23 MED ORDER — SENNOSIDES-DOCUSATE SODIUM 8.6-50 MG PO TABS
1.0000 | ORAL_TABLET | Freq: Every day | ORAL | 0 refills | Status: AC
Start: 2016-02-23 — End: ?

## 2016-02-23 MED ORDER — MIRTAZAPINE 15 MG PO TABS: 15.0000 mg | ORAL_TABLET | Freq: Every day | ORAL | 0 refills | Status: AC

## 2016-02-23 MED ORDER — QUETIAPINE FUMARATE 50 MG PO TABS
50.0000 mg | ORAL_TABLET | Freq: Every day | ORAL | 0 refills | Status: AC
Start: 2016-02-23 — End: ?

## 2016-02-23 MED ORDER — SERTRALINE HCL 50 MG PO TABS: 75.0000 mg | ORAL_TABLET | Freq: Every day | ORAL | 0 refills | Status: AC

## 2016-02-23 MED ORDER — UMECLIDINIUM BROMIDE 62.5 MCG/INH IN AEPB: 1.0000 | INHALATION_SPRAY | Freq: Every day | RESPIRATORY_TRACT | 0 refills | Status: AC

## 2016-02-23 MED ORDER — FUROSEMIDE 20 MG PO TABS: 20.0000 mg | ORAL_TABLET | Freq: Two times a day (BID) | ORAL | 0 refills | Status: AC

## 2016-02-23 MED ORDER — AMIODARONE HCL 100 MG PO TABS: 100.0000 mg | ORAL_TABLET | Freq: Every day | ORAL | 0 refills | Status: AC

## 2016-02-23 NOTE — Progress Notes (Signed)
Discharge to home accompanied by dtr. Equipment in room and discharge instructions given by Algis Liming PA. Reviewed medications and home care. Pt taken out by NT via w/c. Margarito Liner

## 2016-02-23 NOTE — Discharge Instructions (Signed)
Inpatient Rehab Discharge Instructions  Chad Rogers Discharge date and time:  02/23/16  Activities/Precautions/ Functional Status: Activity: no lifting, driving, or strenuous exercise for till cleared by MD. Diet: cardiac diet Low salt Wound Care: none needed   Functional status:  ___ No restrictions     ___ Walk up steps independently _X__ 24/7 supervision/assistance   ___ Walk up steps with assistance ___ Intermittent supervision/assistance  ___ Bathe/dress independently ___ Walk with walker     _X__ Bathe/dress with assistance ___ Walk Independently    ___ Shower independently ___ Walk with assistance    ___ Shower with assistance _X__ No alcohol     ___ Return to work/school ________  Special Instructions: 1. Needs assistance with medication management. 2. Discuss adjustment of inhalers with primary MD.   COMMUNITY REFERRALS UPON DISCHARGE:    Home Health:   PT, [facility]   Date of last service:02/23/2016   Medical Equipment/Items [name]   [phone]    My questions have been answered and I understand these instructions. I will adhere to these goals and the provided educational materials after my discharge from the hospital.  Patient/Caregiver Signature _______________________________ Date __________  Clinician Signature _______________________________________ Date __________  Please bring this form and your medication list with you to all your follow-up doctor's appointments.

## 2016-02-23 NOTE — Progress Notes (Signed)
Subjective/Complaints: Had medication questions-regarding generics and also fluid pill No new issues  ROS: Pt denies fever, rash/itching, headache, blurred or double vision, nausea, vomiting, abdominal pain, diarrhea, chest pain, shortness of breath, palpitations, dysuria, dizziness, neck or back pain, bleeding,   Objective: Vital Signs: Blood pressure 106/65, pulse 99, temperature 98.7 F (37.1 C), temperature source Oral, resp. rate 16, height 5' 11"  (1.803 m), weight 65 kg (143 lb 4.8 oz), SpO2 98 %. No results found. Results for orders placed or performed during the hospital encounter of 02/15/16 (from the past 72 hour(s))  Basic metabolic panel     Status: Abnormal   Collection Time: 02/21/16  6:33 AM  Result Value Ref Range   Sodium 135 135 - 145 mmol/L   Potassium 4.2 3.5 - 5.1 mmol/L   Chloride 98 (L) 101 - 111 mmol/L   CO2 30 22 - 32 mmol/L   Glucose, Bld 105 (H) 65 - 99 mg/dL   BUN 20 6 - 20 mg/dL   Creatinine, Ser 0.81 0.61 - 1.24 mg/dL   Calcium 9.3 8.9 - 10.3 mg/dL   GFR calc non Af Amer >60 >60 mL/min   GFR calc Af Amer >60 >60 mL/min    Comment: (NOTE) The eGFR has been calculated using the CKD EPI equation. This calculation has not been validated in all clinical situations. eGFR's persistently <60 mL/min signify possible Chronic Kidney Disease.    Anion gap 7 5 - 15     HEENT: [place] in nares Cardio: RRR and no murmur Resp: CTA B/L and unlabored. Oxygen via [place] GI: BS positive and psoriatic plaques, face and forearms Extremity:  BS positive and NT, ND Skin:   Other psoriatic plaques face and forearm Neuro: Alert/Oriented and Abnormal Motor 4/5 in BUE and BLE Musc/Skel:  Other restricted ROM B shoulder and hips and knees Gen NAD, frail appearing.    Assessment/Plan: 1. Functional deficits secondary to Deconditioning multiple medical and Left acetabular fx which require 3+ hours per day of interdisciplinary therapy in a comprehensive inpatient rehab  setting. Stable for D/C today F/u PCP in 3-4 weeks F/u Dr Posey Pronto See D/C summary See D/C instructions FIM: Function - Bathing Position: Wheelchair/chair at sink Body parts bathed by patient: Right arm, Left arm, Chest, Abdomen, Front perineal area, Buttocks, Right upper leg, Left upper leg, Right lower leg, Left lower leg Body parts bathed by helper: Back Bathing not applicable: Back Assist Level: Supervision or verbal cues  Function- Upper Body Dressing/Undressing What is the patient wearing?: Pull over shirt/dress Pull over shirt/dress - Perfomed by patient: Thread/unthread right sleeve, Thread/unthread left sleeve, Put head through opening, Pull shirt over trunk Assist Level: No help, No cues Function - Lower Body Dressing/Undressing What is the patient wearing?: Underwear, Pants, Socks Position: Bed Underwear - Performed by patient: Pull underwear up/down Underwear - Performed by helper: Thread/unthread right underwear leg, Thread/unthread left underwear leg Pants- Performed by patient: Pull pants up/down Pants- Performed by helper: Thread/unthread right pants leg, Thread/unthread left pants leg Socks - Performed by patient: Don/doff right sock, Don/doff left sock Socks - Performed by helper: Don/doff right sock, Don/doff left sock Shoes - Performed by patient: Don/doff right shoe, Don/doff left shoe Shoes - Performed by helper: Fasten left TED Hose - Performed by helper: Don/doff left TED hose, Don/doff right TED hose Assist for footwear: Setup Assist for lower body dressing: Touching or steadying assistance (Pt > 75%)  Function - Toileting Toileting steps completed by patient: Adjust clothing prior  to toileting, Adjust clothing after toileting Toileting steps completed by helper: Performs perineal hygiene (per Courtney Paris, NT) Toileting Assistive Devices: Toilet aid Assist level: Set up/obtain supplies  Function - Air cabin crew transfer assistive device: Grab  bar, Walker, Elevated toilet seat/BSC over toilet Assist level to toilet: Supervision or verbal cues Assist level from toilet: Supervision or verbal cues Assist level to bedside commode (at bedside): Moderate assist (Pt 50 - 74%/lift or lower) (per Elmo Putt, NT) Assist level from bedside commode (at bedside): Moderate assist (Pt 50 - 74%/lift or lower)  Function - Chair/bed transfer Chair/bed transfer method: Ambulatory Chair/bed transfer assist level: Supervision or verbal cues Chair/bed transfer assistive device: Armrests, Walker  Function - Locomotion: Wheelchair Will patient use wheelchair at discharge?: No Type: Manual Max wheelchair distance: 150' Assist Level: No help, No cues, assistive device, takes more than reasonable amount of time Assist Level: No help, No cues, assistive device, takes more than reasonable amount of time Assist Level: No help, No cues, assistive device, takes more than reasonable amount of time Turns around,maneuvers to table,bed, and toilet,negotiates 3% grade,maneuvers on rugs and over doorsills: Yes Function - Locomotion: Ambulation Assistive device: Walker-rolling Max distance: 150' Assist level: Supervision or verbal cues Assist level: Supervision or verbal cues Walk 50 feet with 2 turns activity did not occur: Safety/medical concerns (fatigue/low BP) Assist level: Supervision or verbal cues Walk 150 feet activity did not occur: Safety/medical concerns Assist level: Supervision or verbal cues Walk 10 feet on uneven surfaces activity did not occur: Safety/medical concerns Assist level: Supervision or verbal cues  Function - Comprehension Comprehension: Auditory Comprehension assist level: Follows complex conversation/direction with extra time/assistive device  Function - Expression Expression: Verbal Expression assist level: Expresses complex 90% of the time/cues < 10% of the time, Expresses basic needs/ideas: With no assist  Function -  Social Interaction Social Interaction assist level: Interacts appropriately with others with medication or extra time (anti-anxiety, antidepressant).  Function - Problem Solving Problem solving assist level: Solves basic 90% of the time/requires cueing < 10% of the time  Function - Memory Memory assist level: Recognizes or recalls 90% of the time/requires cueing < 10% of the time Patient normally able to recall (first 3 days only): Current season, Location of own room, Staff names and faces, That he or she is in a hospital  Medical Problem List and Plan: 1.  Deconditioning and left acetabular fracture secondary to fall, resulting in multiple rib fractures and respiratory failure requiring mechanical ventilation and tracheostomy  -continue CIR PT, OT, speech, plan d/c in am with PCP, ortho, cardiology f/u 2.  DVT Prophylaxis/Anticoagulation: will add  Pharmaceutical: Lovenox--dopplers negative  3. Pain Management: try to minimize use of opiates. Given his history of alcohol abuse 4. Mood: LCSW to follow for evaluation and support.  5. Neuropsych: This patient is capable of making decisions on his own behalf. 6. Skin/Wound Care: Lubricating cream to eye lids. Steroid cream to psoriatic  plaques? 7. Fluids/Electrolytes/Nutrition: Strict I/O. Encourage po  8. CAD with ICM/CHF: No signs of overload  -. Low salt diet. Reduce lasix 20 mg twice a day monitor for fluid retention-hold lisinopril.  --1750m neg fluid balance 10/3  9. A fib: Monitor HR bid. Mild tachy likely from deconditioning Vitals:   02/22/16 2120 02/23/16 0519  BP: 110/60 106/65  Pulse:  99  Resp:  16  Temp:  98.7 F (37.1 C)   10. Hypotension:  BP ranges from  80/90 to 110. Monitor BP bid--will  set parameters on lasix. Add TEDs 11. Anemia: due to chronic illness and/or Vit B 12 deficiency.  anemia panel--MCV 105.  12. Hepatic steatosis/ Alcohol abuse: LFTs/ platelets stable.  13. COPD/ Pulmonary fibrosis: Respiratory  status compounded by multiple left rib fractures/left chest contusion. Continue duonebs, elliptra, and  Restrictive lung disease SLP for resp muscle training 14. Hyponatremia improved to 135 today, on reduced dose of lasix LOS (Days) 8 A FACE TO FACE EVALUATION WAS PERFORMED  KIRSTEINS,ANDREW E 02/23/2016, 8:52 AM

## 2016-02-23 NOTE — Discharge Summary (Signed)
Physician Discharge Summary  Patient ID: NYHEEM [name] MRN: [id number] DOB/AGE: Jan 25, 1936 80 y.o.  Admit date: 02/15/2016 Discharge date: 02/23/2016  Discharge Diagnoses:  Principal Problem:   Debility Active Problems:   Pulmonary fibrosis (HCC)   Osteoarthritis   Chronic systolic CHF (congestive heart failure) (HCC)   Atrial fibrillation, persistent (HCC)   Hypotension   Hyponatremia   COPD (chronic obstructive pulmonary disease) ([place])   Discharged Condition: stable   Significant Diagnostic Studies: Dg Chest 2 View  Result Date: 02/15/2016 CLINICAL DATA:  Shortness of breath, hypertension.  Smoker. EXAM: CHEST  2 VIEW COMPARISON:  02/12/2016 FINDINGS: The heart size is normal. Aortic atherosclerosis noted. Persistent bilateral pleural effusions are identified right greater in left. There is atelectasis in low both lung bases. Suspect left lower lobe airspace opacity, best seen on the lateral radiograph. IMPRESSION: 1. Bilateral pleural effusions, right greater in left. 2. Bibasilar atelectasis 3. Suspect left lower lobe pneumonia. Electronically Signed   By: Kerby Moors M.D.   On: 02/15/2016 21:57   Dg Chest Port 1 View  Result Date: 02/12/2016 CLINICAL DATA:  Hypoxia EXAM: PORTABLE CHEST 1 VIEW COMPARISON:  01/21/2016 FINDINGS: Small right pleural effusion. Lingular scarring/atelectasis. No pneumothorax. Cardiomegaly. IMPRESSION: Small right pleural effusion. Electronically Signed   By: Julian Hy M.D.   On: 02/12/2016 16:49    Labs:  Basic Metabolic Panel:  Recent Labs Lab 02/19/16 1440 02/21/16 0633  NA 129* 135  K 3.4* 4.2  CL 93* 98*  CO2 27 30  GLUCOSE 152* 105*  BUN 30* 20  CREATININE 1.08 0.81  CALCIUM 8.9 9.3    CBC: CBC Latest Ref Rng & Units 02/16/2016 02/13/2016 02/09/2016  WBC 4.0 - 10.5 K/uL 6.6 10.1 9.8  Hemoglobin 13.0 - 17.0 g/dL 9.0(L) 9.9(L) 9.6(L)  Hematocrit 39.0 - 52.0 % 28.0(L) 30.5(L) 30.8(L)  Platelets 150 - 400 K/uL 254 246  217    CBG: No results for input(s): GLUCAP in the last 168 hours.  Brief HPI:   Chad Rogers is a 80 year old male with history of COPD, pulmonary fibrosis, CAD with ICM--EF 25% andCHF, ETOH abuse, HTN who slipped and fell on 12/28/15 with subsequent left 3 rd - 12th rib fractures, left PTX and left chest emphysema, small left pelvic ring/acetabular fracture, L3 and L4 transverse process fractures who developed hypotension with A fib, CHF and respiratory failure requiring intubation.  Cardiology consulted for input and manageme He had difficulty weaning off vent and tracheostomy performed 8/23. He was transferred to [facility] for vent wean and rehab. He tolerated extubation and was decanulated by 9/22.  Diet advanced to regular textures, thin by 9/26. He was tolerating increase in activity with improved participation therefore CIR was recommended for follow up therapy   Hospital Course: Chad Rogers was admitted to rehab 02/15/2016 for inpatient therapies to consist of PT, ST and OT at least three hours five days a week. Past admission physiatrist, therapy team and rehab RN have worked together to provide customized collaborative inpatient rehab. His respiratory status and endurance has improved slowly. Follow up CXR is showing improvement in bilateral effusions. Hypotension has improved with discontinuation of lisinopril and decrease in lasix. Weight is down to 65 kg and no signs of overload needed. Heart rate has been controlled and no cardiac symptoms reported with increase in activity. Follow up lytes shows that hyponatremia has resolved. Anxiety levels have been controlled with Seroquel at bedtime. Mood has been stable on Remeron and Zoloft.  Pain  control has improved and he was weaned off oxycodone.   He has been educated on importance of tobacco cessation to help with lung function and overall health. He has progressed to supervision level and will continue to receive follow up [place] [place], [place]  and [place] by [place] after discharge.     Rehab course: During patient's stay in rehab weekly team conferences were held to monitor patient's progress, set goals and discuss barriers to discharge. At admission, patient required min assist with mobility and moderate assistance with ADL tasks. He exhibited moderately severe cognitive impairments with MoCA score 24/30. He as had improvement in activity tolerance, balance, postural control, as well as ability to compensate for deficits. He showing improvement in functional problem solving, emergent awareness and recall with supervision. He rest breaks to complete ADL tasks with supervision. He is able to ambulate 150' with RW. Family education was completed regarding all aspects of care and recommendations to provide assistance with medication/financial management.     Disposition: 01-Home or Self Care  Diet: Heart healthy. Low salt.   Special Instructions: 1. Needs assistance with medication management. 2. Discuss adjustment of inhalers with primary MD. 3. Check weight daily.    Discharge Instructions    Ambulatory referral to Physical Medicine Rehab    Complete by:  As directed    Follow up in 2 weeks       Medication List    STOP taking these medications   ALPRAZolam 0.5 MG tablet Commonly known as:  XANAX   bethanechol 10 MG tablet Commonly known as:  URECHOLINE   bisacodyl 10 MG suppository Commonly known as:  DULCOLAX   chlorhexidine 0.12 % solution Commonly known as:  PERIDEX   dextrose 5 % with KCl 20 mEq / L 20-5 MEQ/L-%   digoxin 0.125 MG tablet Commonly known as:  LANOXIN   enoxaparin 40 MG/0.4ML injection Commonly known as:  LOVENOX   famotidine 40 MG/5ML suspension Commonly known as:  PEPCID   free water Soln   furosemide 10 MG/ML injection Commonly known as:  LASIX Replaced by:  furosemide 20 MG tablet   HYDROcodone-acetaminophen 7.5-325 mg/15 ml solution Commonly known as:  HYCET    HYDROmorphone 1 MG/ML injection Commonly known as:  DILAUDID   insulin aspart 100 UNIT/ML injection Commonly known as:  novoLOG   ipratropium 0.02 % nebulizer solution Commonly known as:  ATROVENT   metoprolol tartrate 25 MG tablet Commonly known as:  LOPRESSOR   mouth rinse Liqd solution   ondansetron 4 MG tablet Commonly known as:  ZOFRAN   potassium chloride 20 MEQ/15ML (10%) Soln   sodium chloride flush 0.9 % Soln Commonly known as:  NS     TAKE these medications   acetaminophen 325 MG tablet Commonly known as:  TYLENOL Take 1-2 tablets (325-650 mg total) by mouth every 6 (six) hours as needed for mild pain. Max 2,000 mg /daily What changed:  how much to take  when to take this  additional instructions   amiodarone 100 MG tablet Commonly known as:  PACERONE Take 1 tablet (100 mg total) by mouth daily. Start taking on:  02/24/2016 What changed:  medication strength  how much to take  when to take this   feeding supplement (VITAL AF 1.2 CAL) Liqd Place 1,000 mLs into feeding tube continuous.   fluticasone furoate-vilanterol 100-25 MCG/INH Aepb Commonly known as:  BREO ELLIPTA Inhale 1 puff into the lungs daily. Start taking on:  [id number]   folic  acid 1 MG tablet Commonly known as:  FOLVITE Take 1 tablet (1 mg total) by mouth daily.   furosemide 20 MG tablet Commonly known as:  LASIX Take 1 tablet (20 mg total) by mouth 2 (two) times daily. Replaces:  furosemide 10 MG/ML injection   levalbuterol 0.63 MG/3ML nebulizer solution Commonly known as:  XOPENEX Take 3 mLs (0.63 mg total) by nebulization every 6 (six) hours as needed for wheezing or shortness of breath.   mirtazapine 15 MG tablet Commonly known as:  REMERON Take 1 tablet (15 mg total) by mouth at bedtime.   multivitamin with minerals Tabs tablet Take 1 tablet by mouth daily.   pantoprazole 40 MG tablet Commonly known as:  PROTONIX Take 1 tablet (40 mg total) by mouth  daily. Start taking on:  02/24/2016   potassium chloride 10 MEQ tablet Commonly known as:  K-DUR,KLOR-CON Take 1 tablet (10 mEq total) by mouth 2 (two) times daily.   QUEtiapine 50 MG tablet Commonly known as:  SEROQUEL Take 1 tablet (50 mg total) by mouth at bedtime. What changed:  how to take this  when to take this   senna-docusate 8.6-50 MG tablet Commonly known as:  Senokot-S Take 1 tablet by mouth at bedtime.   sertraline 50 MG tablet Commonly known as:  ZOLOFT Take 1.5 tablets (75 mg total) by mouth daily. Start taking on:  02/24/2016 What changed:  how much to take   thiamine 100 MG tablet Take 1 tablet (100 mg total) by mouth daily.   umeclidinium bromide 62.5 MCG/INH Aepb Commonly known as:  INCRUSE ELLIPTA Inhale 1 puff into the lungs daily. Start taking on:  02/24/2016      Follow-up Information    Ankit Lorie Phenix, MD .   Specialty:  Physical Medicine and Rehabilitation Why:  office will call you with follow up appointment Contact information: [address] [place] [address] [phone]        [name], MD Follow up on 03/06/2016.   Specialty:  Internal Medicine Why:  APPOINTMENT @ 3:50 PM Contact information: [address] [place] [address] [phone]        Adele Barthel, MD. Call today.   Specialties:  Vascular Surgery, Cardiology Why:  as needed for problems with BLE circulation.  Contact information: [place] [address] [phone]        Rozanna Box, MD. Call today.   Specialty:  Orthopedic Surgery Why:  as needed for hip problems Contact information: [place] [address] [phone]        Jacqulyn Ducking, MD. Call in 1 day(s).   Specialty:  Cardiology Why:  for follow up appointment in 2 weeks.  Contact information: [address] [place] [address] [phone]           Signed: Bary Leriche 02/23/2016, 6:14  PM

## 2016-02-23 NOTE — Progress Notes (Signed)
Social Work  Discharge Note  The overall goal for the admission was met for:   Discharge location: Yes-HOME WITH WIFE WHO CAN PROVIDE SUPERVISION LEVEL  Length of Stay: Yes-8 DAYS  Discharge activity level: Yes-SUPERVISION LEVEL  Home/community participation: Yes  Services provided included: MD, RD, PT, OT, SLP, RN, CM, TR, Pharmacy and SW  Financial Services: Private Insurance: [facility]  Follow-up services arranged: Home Health: [facility]-PT,OT,SP,RN, DME: [facility]-3 IN 1 & [facility] and Patient/Family has no preference for HH/DME agencies  Comments (or additional information):PT DID VERY WELL AND REACHED HIS GOALS [place]. WIFE WILL BE THERE WITH HIM AND CHILDREN WILL BE IN AND OUT DIDN'T WANT SUBSTANCE ABUSE RESOURCES FELT HAS BEEN LONG ENOUGH NOW CAN DO WITHOUT IT.   Patient/Family verbalized understanding of follow-up arrangements: Yes  Individual responsible for coordination of the follow-up plan: IRMA-WIFE & MARCY-DAUGHTER  Confirmed correct DME delivered: Elease Hashimoto 02/23/2016    Elease Hashimoto

## 2016-02-26 ENCOUNTER — Telehealth: Payer: Self-pay | Admitting: *Deleted

## 2016-02-26 DIAGNOSIS — I2510 Atherosclerotic heart disease of native coronary artery without angina pectoris: Secondary | ICD-10-CM | POA: Diagnosis not present

## 2016-02-26 DIAGNOSIS — S2242XD Multiple fractures of ribs, left side, subsequent encounter for fracture with routine healing: Secondary | ICD-10-CM | POA: Diagnosis not present

## 2016-02-26 DIAGNOSIS — S32402D Unspecified fracture of left acetabulum, subsequent encounter for fracture with routine healing: Secondary | ICD-10-CM | POA: Diagnosis not present

## 2016-02-26 DIAGNOSIS — Z9181 History of falling: Secondary | ICD-10-CM | POA: Diagnosis not present

## 2016-02-26 DIAGNOSIS — M1991 Primary osteoarthritis, unspecified site: Secondary | ICD-10-CM | POA: Diagnosis not present

## 2016-02-26 DIAGNOSIS — I5021 Acute systolic (congestive) heart failure: Secondary | ICD-10-CM | POA: Diagnosis not present

## 2016-02-26 DIAGNOSIS — S32048D Other fracture of fourth lumbar vertebra, subsequent encounter for fracture with routine healing: Secondary | ICD-10-CM | POA: Diagnosis not present

## 2016-02-26 DIAGNOSIS — I110 Hypertensive heart disease with heart failure: Secondary | ICD-10-CM | POA: Diagnosis not present

## 2016-02-26 DIAGNOSIS — S32038D Other fracture of third lumbar vertebra, subsequent encounter for fracture with routine healing: Secondary | ICD-10-CM | POA: Diagnosis not present

## 2016-02-26 DIAGNOSIS — J449 Chronic obstructive pulmonary disease, unspecified: Secondary | ICD-10-CM | POA: Diagnosis not present

## 2016-02-26 NOTE — Telephone Encounter (Signed)
Pharmacist called from [facility] and left a message asking for [place] to change Pacerone Rx to 200 mg. The pharmacist states that pt's insurance will not cover a 100 mg tab.  I called the pharmacy back and they said the situation was resolved

## 2016-02-28 ENCOUNTER — Emergency Department (HOSPITAL_COMMUNITY): Payer: Medicare Other

## 2016-02-28 ENCOUNTER — Inpatient Hospital Stay (HOSPITAL_COMMUNITY)
Admission: EM | Admit: 2016-02-28 | Discharge: 2016-03-20 | DRG: 870 | Disposition: E | Payer: Medicare Other | Attending: Internal Medicine | Admitting: Internal Medicine

## 2016-02-28 ENCOUNTER — Encounter (HOSPITAL_COMMUNITY): Payer: Self-pay

## 2016-02-28 DIAGNOSIS — I472 Ventricular tachycardia: Secondary | ICD-10-CM | POA: Diagnosis not present

## 2016-02-28 DIAGNOSIS — A408 Other streptococcal sepsis: Secondary | ICD-10-CM | POA: Diagnosis not present

## 2016-02-28 DIAGNOSIS — W19XXXD Unspecified fall, subsequent encounter: Secondary | ICD-10-CM | POA: Diagnosis present

## 2016-02-28 DIAGNOSIS — G9340 Encephalopathy, unspecified: Secondary | ICD-10-CM | POA: Diagnosis present

## 2016-02-28 DIAGNOSIS — I5022 Chronic systolic (congestive) heart failure: Secondary | ICD-10-CM | POA: Diagnosis not present

## 2016-02-28 DIAGNOSIS — R0602 Shortness of breath: Secondary | ICD-10-CM | POA: Diagnosis not present

## 2016-02-28 DIAGNOSIS — R269 Unspecified abnormalities of gait and mobility: Secondary | ICD-10-CM | POA: Diagnosis not present

## 2016-02-28 DIAGNOSIS — S2243XD Multiple fractures of ribs, bilateral, subsequent encounter for fracture with routine healing: Secondary | ICD-10-CM | POA: Diagnosis not present

## 2016-02-28 DIAGNOSIS — R6521 Severe sepsis with septic shock: Secondary | ICD-10-CM | POA: Diagnosis not present

## 2016-02-28 DIAGNOSIS — Z9181 History of falling: Secondary | ICD-10-CM | POA: Diagnosis not present

## 2016-02-28 DIAGNOSIS — I5020 Unspecified systolic (congestive) heart failure: Secondary | ICD-10-CM

## 2016-02-28 DIAGNOSIS — R5381 Other malaise: Secondary | ICD-10-CM | POA: Diagnosis present

## 2016-02-28 DIAGNOSIS — J9600 Acute respiratory failure, unspecified whether with hypoxia or hypercapnia: Secondary | ICD-10-CM | POA: Diagnosis not present

## 2016-02-28 DIAGNOSIS — I429 Cardiomyopathy, unspecified: Secondary | ICD-10-CM | POA: Diagnosis present

## 2016-02-28 DIAGNOSIS — I248 Other forms of acute ischemic heart disease: Secondary | ICD-10-CM | POA: Diagnosis present

## 2016-02-28 DIAGNOSIS — R740 Nonspecific elevation of levels of transaminase and lactic acid dehydrogenase [LDH]: Secondary | ICD-10-CM | POA: Diagnosis present

## 2016-02-28 DIAGNOSIS — Z66 Do not resuscitate: Secondary | ICD-10-CM | POA: Diagnosis not present

## 2016-02-28 DIAGNOSIS — I5023 Acute on chronic systolic (congestive) heart failure: Secondary | ICD-10-CM

## 2016-02-28 DIAGNOSIS — Z978 Presence of other specified devices: Secondary | ICD-10-CM

## 2016-02-28 DIAGNOSIS — I130 Hypertensive heart and chronic kidney disease with heart failure and stage 1 through stage 4 chronic kidney disease, or unspecified chronic kidney disease: Secondary | ICD-10-CM | POA: Diagnosis present

## 2016-02-28 DIAGNOSIS — J440 Chronic obstructive pulmonary disease with acute lower respiratory infection: Secondary | ICD-10-CM | POA: Diagnosis not present

## 2016-02-28 DIAGNOSIS — Y95 Nosocomial condition: Secondary | ICD-10-CM | POA: Diagnosis present

## 2016-02-28 DIAGNOSIS — I4510 Unspecified right bundle-branch block: Secondary | ICD-10-CM | POA: Diagnosis present

## 2016-02-28 DIAGNOSIS — T502X5A Adverse effect of carbonic-anhydrase inhibitors, benzothiadiazides and other diuretics, initial encounter: Secondary | ICD-10-CM | POA: Diagnosis not present

## 2016-02-28 DIAGNOSIS — E872 Acidosis, unspecified: Secondary | ICD-10-CM

## 2016-02-28 DIAGNOSIS — I5043 Acute on chronic combined systolic (congestive) and diastolic (congestive) heart failure: Secondary | ICD-10-CM | POA: Diagnosis present

## 2016-02-28 DIAGNOSIS — I4819 Other persistent atrial fibrillation: Secondary | ICD-10-CM

## 2016-02-28 DIAGNOSIS — S2242XD Multiple fractures of ribs, left side, subsequent encounter for fracture with routine healing: Secondary | ICD-10-CM | POA: Diagnosis not present

## 2016-02-28 DIAGNOSIS — S32038D Other fracture of third lumbar vertebra, subsequent encounter for fracture with routine healing: Secondary | ICD-10-CM | POA: Diagnosis not present

## 2016-02-28 DIAGNOSIS — R627 Adult failure to thrive: Secondary | ICD-10-CM | POA: Diagnosis present

## 2016-02-28 DIAGNOSIS — N179 Acute kidney failure, unspecified: Secondary | ICD-10-CM

## 2016-02-28 DIAGNOSIS — E43 Unspecified severe protein-calorie malnutrition: Secondary | ICD-10-CM | POA: Diagnosis not present

## 2016-02-28 DIAGNOSIS — I5021 Acute systolic (congestive) heart failure: Secondary | ICD-10-CM | POA: Diagnosis not present

## 2016-02-28 DIAGNOSIS — Z515 Encounter for palliative care: Secondary | ICD-10-CM | POA: Diagnosis not present

## 2016-02-28 DIAGNOSIS — R64 Cachexia: Secondary | ICD-10-CM | POA: Diagnosis not present

## 2016-02-28 DIAGNOSIS — E871 Hypo-osmolality and hyponatremia: Secondary | ICD-10-CM | POA: Diagnosis not present

## 2016-02-28 DIAGNOSIS — R0789 Other chest pain: Secondary | ICD-10-CM

## 2016-02-28 DIAGNOSIS — J9601 Acute respiratory failure with hypoxia: Secondary | ICD-10-CM | POA: Diagnosis not present

## 2016-02-28 DIAGNOSIS — I2781 Cor pulmonale (chronic): Secondary | ICD-10-CM | POA: Diagnosis present

## 2016-02-28 DIAGNOSIS — J189 Pneumonia, unspecified organism: Secondary | ICD-10-CM | POA: Diagnosis not present

## 2016-02-28 DIAGNOSIS — I481 Persistent atrial fibrillation: Secondary | ICD-10-CM | POA: Diagnosis not present

## 2016-02-28 DIAGNOSIS — L8990 Pressure ulcer of unspecified site, unspecified stage: Secondary | ICD-10-CM | POA: Insufficient documentation

## 2016-02-28 DIAGNOSIS — N320 Bladder-neck obstruction: Secondary | ICD-10-CM | POA: Diagnosis not present

## 2016-02-28 DIAGNOSIS — I5041 Acute combined systolic (congestive) and diastolic (congestive) heart failure: Secondary | ICD-10-CM

## 2016-02-28 DIAGNOSIS — I4892 Unspecified atrial flutter: Secondary | ICD-10-CM | POA: Diagnosis not present

## 2016-02-28 DIAGNOSIS — N182 Chronic kidney disease, stage 2 (mild): Secondary | ICD-10-CM | POA: Diagnosis not present

## 2016-02-28 DIAGNOSIS — Z79899 Other long term (current) drug therapy: Secondary | ICD-10-CM

## 2016-02-28 DIAGNOSIS — R001 Bradycardia, unspecified: Secondary | ICD-10-CM | POA: Diagnosis not present

## 2016-02-28 DIAGNOSIS — J8410 Pulmonary fibrosis, unspecified: Secondary | ICD-10-CM | POA: Diagnosis not present

## 2016-02-28 DIAGNOSIS — S270XXA Traumatic pneumothorax, initial encounter: Secondary | ICD-10-CM

## 2016-02-28 DIAGNOSIS — Z85828 Personal history of other malignant neoplasm of skin: Secondary | ICD-10-CM

## 2016-02-28 DIAGNOSIS — I469 Cardiac arrest, cause unspecified: Secondary | ICD-10-CM | POA: Diagnosis not present

## 2016-02-28 DIAGNOSIS — A419 Sepsis, unspecified organism: Principal | ICD-10-CM

## 2016-02-28 DIAGNOSIS — E785 Hyperlipidemia, unspecified: Secondary | ICD-10-CM | POA: Diagnosis present

## 2016-02-28 DIAGNOSIS — Z720 Tobacco use: Secondary | ICD-10-CM | POA: Diagnosis present

## 2016-02-28 DIAGNOSIS — E875 Hyperkalemia: Secondary | ICD-10-CM | POA: Diagnosis present

## 2016-02-28 DIAGNOSIS — S2249XA Multiple fractures of ribs, unspecified side, initial encounter for closed fracture: Secondary | ICD-10-CM | POA: Diagnosis present

## 2016-02-28 DIAGNOSIS — F17210 Nicotine dependence, cigarettes, uncomplicated: Secondary | ICD-10-CM | POA: Diagnosis present

## 2016-02-28 DIAGNOSIS — I509 Heart failure, unspecified: Secondary | ICD-10-CM | POA: Diagnosis not present

## 2016-02-28 DIAGNOSIS — R739 Hyperglycemia, unspecified: Secondary | ICD-10-CM | POA: Diagnosis not present

## 2016-02-28 DIAGNOSIS — S32402D Unspecified fracture of left acetabulum, subsequent encounter for fracture with routine healing: Secondary | ICD-10-CM | POA: Diagnosis not present

## 2016-02-28 DIAGNOSIS — N170 Acute kidney failure with tubular necrosis: Secondary | ICD-10-CM | POA: Diagnosis not present

## 2016-02-28 DIAGNOSIS — I10 Essential (primary) hypertension: Secondary | ICD-10-CM | POA: Diagnosis present

## 2016-02-28 DIAGNOSIS — J9690 Respiratory failure, unspecified, unspecified whether with hypoxia or hypercapnia: Secondary | ICD-10-CM

## 2016-02-28 DIAGNOSIS — S32048D Other fracture of fourth lumbar vertebra, subsequent encounter for fracture with routine healing: Secondary | ICD-10-CM | POA: Diagnosis not present

## 2016-02-28 DIAGNOSIS — I110 Hypertensive heart disease with heart failure: Secondary | ICD-10-CM | POA: Diagnosis not present

## 2016-02-28 DIAGNOSIS — E876 Hypokalemia: Secondary | ICD-10-CM | POA: Diagnosis present

## 2016-02-28 DIAGNOSIS — J810 Acute pulmonary edema: Secondary | ICD-10-CM | POA: Diagnosis not present

## 2016-02-28 DIAGNOSIS — I2510 Atherosclerotic heart disease of native coronary artery without angina pectoris: Secondary | ICD-10-CM | POA: Diagnosis present

## 2016-02-28 DIAGNOSIS — Z452 Encounter for adjustment and management of vascular access device: Secondary | ICD-10-CM | POA: Diagnosis not present

## 2016-02-28 DIAGNOSIS — F419 Anxiety disorder, unspecified: Secondary | ICD-10-CM | POA: Diagnosis not present

## 2016-02-28 DIAGNOSIS — J90 Pleural effusion, not elsewhere classified: Secondary | ICD-10-CM | POA: Diagnosis not present

## 2016-02-28 DIAGNOSIS — Z4682 Encounter for fitting and adjustment of non-vascular catheter: Secondary | ICD-10-CM | POA: Diagnosis not present

## 2016-02-28 DIAGNOSIS — R570 Cardiogenic shock: Secondary | ICD-10-CM | POA: Diagnosis present

## 2016-02-28 DIAGNOSIS — D649 Anemia, unspecified: Secondary | ICD-10-CM | POA: Diagnosis present

## 2016-02-28 DIAGNOSIS — J449 Chronic obstructive pulmonary disease, unspecified: Secondary | ICD-10-CM | POA: Diagnosis not present

## 2016-02-28 DIAGNOSIS — I482 Chronic atrial fibrillation: Secondary | ICD-10-CM | POA: Diagnosis present

## 2016-02-28 DIAGNOSIS — Z6820 Body mass index (BMI) 20.0-20.9, adult: Secondary | ICD-10-CM

## 2016-02-28 DIAGNOSIS — M1991 Primary osteoarthritis, unspecified site: Secondary | ICD-10-CM | POA: Diagnosis not present

## 2016-02-28 HISTORY — DX: Unspecified atrial fibrillation: I48.91

## 2016-02-28 LAB — URINE MICROSCOPIC-ADD ON

## 2016-02-28 LAB — CBC WITH DIFFERENTIAL/PLATELET
BASOS ABS: 0 10*3/uL (ref 0.0–0.1)
Basophils Relative: 0 %
EOS ABS: 0 10*3/uL (ref 0.0–0.7)
Eosinophils Relative: 0 %
HCT: 32.9 % — ABNORMAL LOW (ref 39.0–52.0)
Hemoglobin: 10.7 g/dL — ABNORMAL LOW (ref 13.0–17.0)
LYMPHS PCT: 20 %
Lymphs Abs: 3.4 10*3/uL (ref 0.7–4.0)
MCH: 32.5 pg (ref 26.0–34.0)
MCHC: 32.5 g/dL (ref 30.0–36.0)
MCV: 100 fL (ref 78.0–100.0)
MONO ABS: 1.4 10*3/uL — AB (ref 0.1–1.0)
Monocytes Relative: 8 %
NEUTROS PCT: 72 %
Neutro Abs: 12.2 10*3/uL — ABNORMAL HIGH (ref 1.7–7.7)
PLATELETS: 428 10*3/uL — AB (ref 150–400)
RBC: 3.29 MIL/uL — ABNORMAL LOW (ref 4.22–5.81)
RDW: 17.1 % — AB (ref 11.5–15.5)
Smear Review: INCREASED
WBC: 17 10*3/uL — AB (ref 4.0–10.5)

## 2016-02-28 LAB — URINALYSIS, ROUTINE W REFLEX MICROSCOPIC
GLUCOSE, UA: 100 mg/dL — AB
KETONES UR: NEGATIVE mg/dL
LEUKOCYTES UA: NEGATIVE
NITRITE: NEGATIVE
PH: 5.5 (ref 5.0–8.0)
Specific Gravity, Urine: >1.03 — ABNORMAL HIGH (ref 1.005–1.030)

## 2016-02-28 LAB — COMPREHENSIVE METABOLIC PANEL
ALK PHOS: 95 U/L (ref 38–126)
ALT: 33 U/L (ref 17–63)
ANION GAP: 17 — AB (ref 5–15)
AST: 32 U/L (ref 15–41)
Albumin: 3.7 g/dL (ref 3.5–5.0)
BILIRUBIN TOTAL: 1.6 mg/dL — AB (ref 0.3–1.2)
BUN: 30 mg/dL — ABNORMAL HIGH (ref 6–20)
CALCIUM: 9.3 mg/dL (ref 8.9–10.3)
CO2: 17 mmol/L — AB (ref 22–32)
CREATININE: 1.82 mg/dL — AB (ref 0.61–1.24)
Chloride: 92 mmol/L — ABNORMAL LOW (ref 101–111)
GFR, EST AFRICAN AMERICAN: 39 mL/min — AB (ref >60)
GFR, EST NON AFRICAN AMERICAN: 34 mL/min — AB (ref >60)
Glucose, Bld: 138 mg/dL — ABNORMAL HIGH (ref 65–99)
Potassium: 4.5 mmol/L (ref 3.5–5.1)
SODIUM: 126 mmol/L — AB (ref 135–145)
TOTAL PROTEIN: 7.2 g/dL (ref 6.5–8.1)

## 2016-02-28 LAB — BLOOD GAS, ARTERIAL
ACID-BASE DEFICIT: 8.8 mmol/L — AB (ref 0.0–2.0)
Bicarbonate: 17.5 mmol/L — ABNORMAL LOW (ref 20.0–28.0)
DRAWN BY: 234301
O2 Content: 2 L/min
O2 SAT: 84.1 %
PCO2 ART: 27.5 mmHg — AB (ref 32.0–48.0)
PO2 ART: 57.9 mmHg — AB (ref 83.0–108.0)
pH, Arterial: 7.369 (ref 7.350–7.450)

## 2016-02-28 LAB — LACTIC ACID, PLASMA: LACTIC ACID, VENOUS: 3.1 mmol/L — AB (ref 0.5–1.9)

## 2016-02-28 MED ORDER — SODIUM CHLORIDE 0.9 % IV BOLUS (SEPSIS)
250.0000 mL | Freq: Once | INTRAVENOUS | Status: AC
  Administered 2016-02-29: 250 mL via INTRAVENOUS

## 2016-02-28 MED ORDER — ONDANSETRON HCL 4 MG PO TABS
4.0000 mg | ORAL_TABLET | Freq: Four times a day (QID) | ORAL | Status: DC | PRN
  Administered 2016-03-01 – 2016-03-02 (×3): 4 mg via ORAL
  Filled 2016-02-28 (×3): qty 1

## 2016-02-28 MED ORDER — HEPARIN SODIUM (PORCINE) 5000 UNIT/ML IJ SOLN
5000.0000 [IU] | Freq: Three times a day (TID) | INTRAMUSCULAR | Status: DC
  Administered 2016-02-28 – 2016-03-16 (×50): 5000 [IU] via SUBCUTANEOUS
  Filled 2016-02-28 (×51): qty 1

## 2016-02-28 MED ORDER — VANCOMYCIN HCL IN DEXTROSE 1-5 GM/200ML-% IV SOLN
1000.0000 mg | INTRAVENOUS | Status: DC
  Administered 2016-02-29 – 2016-03-01 (×2): 1000 mg via INTRAVENOUS
  Filled 2016-02-28 (×2): qty 200

## 2016-02-28 MED ORDER — VANCOMYCIN HCL IN DEXTROSE 1-5 GM/200ML-% IV SOLN
1000.0000 mg | Freq: Once | INTRAVENOUS | Status: AC
  Administered 2016-02-28: 1000 mg via INTRAVENOUS
  Filled 2016-02-28: qty 200

## 2016-02-28 MED ORDER — SODIUM CHLORIDE 0.9 % IV BOLUS (SEPSIS)
1000.0000 mL | Freq: Once | INTRAVENOUS | Status: AC
  Administered 2016-02-28: 1000 mL via INTRAVENOUS

## 2016-02-28 MED ORDER — LEVALBUTEROL HCL 0.63 MG/3ML IN NEBU: 0.6300 mg | INHALATION_SOLUTION | Freq: Four times a day (QID) | RESPIRATORY_TRACT | Status: DC | PRN

## 2016-02-28 MED ORDER — SODIUM CHLORIDE 0.9% FLUSH
3.0000 mL | Freq: Two times a day (BID) | INTRAVENOUS | Status: DC
  Administered 2016-02-28 – 2016-03-03 (×8): 3 mL via INTRAVENOUS

## 2016-02-28 MED ORDER — DEXTROSE 5 % IV SOLN
2.0000 g | Freq: Once | INTRAVENOUS | Status: AC
  Administered 2016-02-28: 2 g via INTRAVENOUS
  Filled 2016-02-28: qty 2

## 2016-02-28 MED ORDER — SENNOSIDES-DOCUSATE SODIUM 8.6-50 MG PO TABS
1.0000 | ORAL_TABLET | Freq: Every day | ORAL | Status: DC
  Administered 2016-02-28 – 2016-03-03 (×5): 1 via ORAL
  Filled 2016-02-28 (×5): qty 1

## 2016-02-28 MED ORDER — VITAL AF 1.2 CAL PO LIQD
1000.0000 mL | ORAL | Status: DC
  Filled 2016-02-28 (×3): qty 1000

## 2016-02-28 MED ORDER — ONDANSETRON HCL 4 MG/2ML IJ SOLN
4.0000 mg | Freq: Four times a day (QID) | INTRAMUSCULAR | Status: DC | PRN
  Administered 2016-03-02 – 2016-03-03 (×2): 4 mg via INTRAVENOUS
  Filled 2016-02-28 (×2): qty 2

## 2016-02-28 MED ORDER — SERTRALINE HCL 50 MG PO TABS
75.0000 mg | ORAL_TABLET | Freq: Every day | ORAL | Status: DC
  Administered 2016-02-29 – 2016-03-04 (×5): 75 mg via ORAL
  Filled 2016-02-28 (×5): qty 2

## 2016-02-28 MED ORDER — DEXTROSE 5 % IV SOLN
1.0000 g | INTRAVENOUS | Status: DC
  Administered 2016-02-29 – 2016-03-01 (×2): 1 g via INTRAVENOUS
  Filled 2016-02-28 (×4): qty 1

## 2016-02-28 MED ORDER — UMECLIDINIUM BROMIDE 62.5 MCG/INH IN AEPB
1.0000 | INHALATION_SPRAY | Freq: Every day | RESPIRATORY_TRACT | Status: DC
  Administered 2016-02-29 – 2016-03-04 (×5): 1 via RESPIRATORY_TRACT
  Filled 2016-02-28: qty 7

## 2016-02-28 MED ORDER — ADULT MULTIVITAMIN W/MINERALS CH
1.0000 | ORAL_TABLET | Freq: Every day | ORAL | Status: DC
  Administered 2016-02-29 – 2016-03-04 (×5): 1 via ORAL
  Filled 2016-02-28 (×5): qty 1

## 2016-02-28 MED ORDER — QUETIAPINE FUMARATE 25 MG PO TABS
50.0000 mg | ORAL_TABLET | Freq: Every day | ORAL | Status: DC
  Administered 2016-02-28 – 2016-03-03 (×5): 50 mg via ORAL
  Filled 2016-02-28 (×8): qty 2

## 2016-02-28 MED ORDER — DEXTROSE-NACL 5-0.9 % IV SOLN
INTRAVENOUS | Status: AC
  Administered 2016-02-29: 01:00:00 via INTRAVENOUS

## 2016-02-28 MED ORDER — DOCUSATE SODIUM 100 MG PO CAPS
100.0000 mg | ORAL_CAPSULE | Freq: Two times a day (BID) | ORAL | Status: DC
  Administered 2016-02-28 – 2016-03-04 (×10): 100 mg via ORAL
  Filled 2016-02-28 (×10): qty 1

## 2016-02-28 MED ORDER — MIRTAZAPINE 30 MG PO TABS
15.0000 mg | ORAL_TABLET | Freq: Every day | ORAL | Status: DC
  Administered 2016-02-29 – 2016-03-03 (×4): 15 mg via ORAL
  Filled 2016-02-28 (×3): qty 1
  Filled 2016-02-28: qty 0.5
  Filled 2016-02-28: qty 1

## 2016-02-28 MED ORDER — FLUTICASONE FUROATE-VILANTEROL 100-25 MCG/INH IN AEPB
1.0000 | INHALATION_SPRAY | Freq: Every day | RESPIRATORY_TRACT | Status: DC
  Administered 2016-02-29 – 2016-03-04 (×5): 1 via RESPIRATORY_TRACT
  Filled 2016-02-28: qty 28

## 2016-02-28 MED ORDER — HYDROCORTISONE 1 % EX LOTN
TOPICAL_LOTION | Freq: Two times a day (BID) | CUTANEOUS | Status: DC
  Filled 2016-02-28: qty 118

## 2016-02-28 MED ORDER — AMIODARONE HCL 200 MG PO TABS
100.0000 mg | ORAL_TABLET | Freq: Every day | ORAL | Status: DC
  Administered 2016-02-29 – 2016-03-04 (×5): 100 mg via ORAL
  Filled 2016-02-28 (×7): qty 1

## 2016-02-28 MED ORDER — OXYCODONE-ACETAMINOPHEN 5-325 MG PO TABS
1.0000 | ORAL_TABLET | ORAL | Status: DC | PRN
  Administered 2016-02-28 – 2016-03-02 (×4): 1 via ORAL
  Administered 2016-03-03 (×2): 2 via ORAL
  Filled 2016-02-28 (×3): qty 1
  Filled 2016-02-28: qty 2
  Filled 2016-02-28: qty 1
  Filled 2016-02-28: qty 2

## 2016-02-28 MED ORDER — VITAMIN B-1 100 MG PO TABS
100.0000 mg | ORAL_TABLET | Freq: Every day | ORAL | Status: DC
  Administered 2016-02-29 – 2016-03-04 (×5): 100 mg via ORAL
  Filled 2016-02-28 (×5): qty 1

## 2016-02-28 MED ORDER — PANTOPRAZOLE SODIUM 40 MG PO TBEC
40.0000 mg | DELAYED_RELEASE_TABLET | Freq: Every day | ORAL | Status: DC
  Administered 2016-02-29 – 2016-03-04 (×5): 40 mg via ORAL
  Filled 2016-02-28 (×5): qty 1

## 2016-02-28 MED ORDER — FOLIC ACID 1 MG PO TABS
1.0000 mg | ORAL_TABLET | Freq: Every day | ORAL | Status: DC
  Administered 2016-02-29 – 2016-03-04 (×5): 1 mg via ORAL
  Filled 2016-02-28 (×5): qty 1

## 2016-02-28 MED ORDER — ACETAMINOPHEN 325 MG PO TABS
325.0000 mg | ORAL_TABLET | Freq: Four times a day (QID) | ORAL | Status: DC | PRN
  Administered 2016-02-29 – 2016-03-01 (×2): 650 mg via ORAL
  Filled 2016-02-28 (×2): qty 2

## 2016-02-28 MED ORDER — POTASSIUM CHLORIDE CRYS ER 10 MEQ PO TBCR
10.0000 meq | EXTENDED_RELEASE_TABLET | Freq: Two times a day (BID) | ORAL | Status: DC
  Administered 2016-02-29 – 2016-03-04 (×9): 10 meq via ORAL
  Filled 2016-02-28 (×9): qty 1

## 2016-02-28 NOTE — ED Notes (Signed)
Pt back from CT

## 2016-02-28 NOTE — Progress Notes (Signed)
[facility] Progress Note Patient Name: Chad Rogers DOB: 1935-08-19 MRN: [id number]   Date of Service  02/24/2016  HPI/Events of Note  Lactate ordered  eICU Interventions       Intervention Category Evaluation Type: Other  BYRUM,ROBERT S. 02/20/2016, 10:58 PM

## 2016-02-28 NOTE — ED Provider Notes (Addendum)
[facility] DEPT Provider Note   CSN: [id number] Arrival date & time: 03/08/2016  1607     History   Chief Complaint Chief Complaint  Patient presents with  . Shortness of Breath    HPI Chad Rogers is a 80 y.o. male.  HPI Pt has a complex recent medical history.  He just left rehab hospital about a week ago after a prolonged stay for rib fractures/ptx , resulting in intubation , chest tubeand tracheostomy. Pt started having nausea and vomiting over the weekend.  It seemed to taper off.  He was not vomiting yesterday but he started to have trouble with his breathing.  Pt became more short of breath today.  The symptoms were severe.  EMS was called and his oxygen level was in the 70s.  Pt denies chest pain.  He denies fevers.  He does have diffuse weakness  Past Medical History:  Diagnosis Date  . Alcohol abuse    family reports that [pt will drink at least 6 beers or more a day,   . Allergic rhinitis   . Cardiomyopathy [id number]   Acute systolic CHF at presentation; 05/2009 cardiac cath- 06/01/09  non obst coronary artery disease with EF 25%  . CHF (congestive heart failure) ([place])   . COPD (chronic obstructive pulmonary disease) ([place])   . Coronary artery disease   . Hypertension   . Osteoarthritis   . Psoriasis   . Pulmonary fibrosis ([place])    12/2004 with basilar fibrotic changes;  PFT's 06/07/09 FEV1 1.98 (68%) raio 53 no better after B2,  DLC0105%  . Skin cancer   . Subdural hygroma 03/2005   bilateral   . Tobacco abuse    100 pack years; quit    Patient Active Problem List   Diagnosis Date Noted  . Hypotension 02/23/2016  . Hyponatremia 02/23/2016  . COPD (chronic obstructive pulmonary disease) ([place]) 02/23/2016  . Debility 02/15/2016  . Atrial fibrillation, persistent ([place]) 01/15/2016  . Absent pulse in lower extremity   . Chronic systolic CHF (congestive heart failure) ([place]) 01/01/2016  . NSVT (nonsustained ventricular tachycardia) ([place]) 01/01/2016  .  Respiratory failure ([place])   . Traumatic fracture of ribs with pneumothorax 12/29/2015  . Multiple rib fractures 12/29/2015  . Cardiomyopathy ([place])   . Hypertension   . Pulmonary fibrosis ([place])   . Osteoarthritis   . Tobacco abuse     Past Surgical History:  Procedure Laterality Date  . CHEST TUBE INSERTION    . HAND SURGERY    . INGUINAL HERNIA REPAIR    . PERCUTANEOUS TRACHEOSTOMY N/A 01/10/2016   Procedure: PERCUTANEOUS TRACHEOSTOMY;  Surgeon: Judeth Horn, MD;  Location: [facility];  Service: General;  Laterality: N/A;  . ROTATOR CUFF REPAIR         Home Medications    Prior to Admission medications   Medication Sig Start Date End Date Taking? Authorizing Provider  acetaminophen (TYLENOL) 325 MG tablet Take 1-2 tablets (325-650 mg total) by mouth every 6 (six) hours as needed for mild pain. Max 2,000 mg /daily 02/21/16  Yes Ivan Anchors Love, PA-C  amiodarone (PACERONE) 100 MG tablet Take 1 tablet (100 mg total) by mouth daily. 02/24/16  Yes Ivan Anchors Love, PA-C  fluticasone furoate-vilanterol (BREO ELLIPTA) 100-25 MCG/INH AEPB Inhale 1 puff into the lungs daily. 02/24/16  Yes Ivan Anchors Love, PA-C  folic acid (FOLVITE) 1 MG tablet Take 1 tablet (1 mg total) by mouth daily. 01/19/16  Yes Jerrye Beavers, PA-C  levalbuterol (  XOPENEX) 0.63 MG/3ML nebulizer solution Take 3 mLs (0.63 mg total) by nebulization every 6 (six) hours as needed for wheezing or shortness of breath. 01/19/16  Yes Jerrye Beavers, PA-C  mirtazapine (REMERON) 15 MG tablet Take 1 tablet (15 mg total) by mouth at bedtime. 02/23/16  Yes Ivan Anchors Love, PA-C  Multiple Vitamin (MULTIVITAMIN WITH MINERALS) TABS tablet Take 1 tablet by mouth daily. 01/19/16  Yes Jerrye Beavers, PA-C  pantoprazole (PROTONIX) 40 MG tablet Take 1 tablet (40 mg total) by mouth daily. 02/24/16  Yes Ivan Anchors Love, PA-C  QUEtiapine (SEROQUEL) 50 MG tablet Take 1 tablet (50 mg total) by mouth at bedtime. 02/23/16  Yes Ivan Anchors Love, PA-C  senna-docusate (SENOKOT-S)  8.6-50 MG tablet Take 1 tablet by mouth at bedtime. 02/23/16  Yes Ivan Anchors Love, PA-C  sertraline (ZOLOFT) 50 MG tablet Take 1.5 tablets (75 mg total) by mouth daily. 02/24/16  Yes Ivan Anchors Love, PA-C  thiamine 100 MG tablet Take 1 tablet (100 mg total) by mouth daily. 01/19/16  Yes Jerrye Beavers, PA-C  umeclidinium bromide (INCRUSE ELLIPTA) 62.5 MCG/INH AEPB Inhale 1 puff into the lungs daily. 02/24/16  Yes Ivan Anchors Love, PA-C  furosemide (LASIX) 20 MG tablet Take 1 tablet (20 mg total) by mouth 2 (two) times daily. Patient not taking: Reported on 02/23/2016 02/23/16   Ivan Anchors Love, PA-C  Nutritional Supplements (FEEDING SUPPLEMENT, VITAL AF 1.2 CAL,) LIQD Place 1,000 mLs into feeding tube continuous. 01/19/16   Jerrye Beavers, PA-C  potassium chloride (K-DUR,KLOR-CON) 10 MEQ tablet Take 1 tablet (10 mEq total) by mouth 2 (two) times daily. Patient not taking: Reported on 03/12/2016 02/23/16   Bary Leriche, PA-C    Family History Family History  Problem Relation Age of Onset  . Cancer Mother 27    gynecologic  . Aneurysm Father 69    cerebral aneurysm  . Asthma Maternal Grandmother     Social History Social History  Substance Use Topics  . Smoking status: Current Every Day Smoker    Packs/day: 2.00    Years: 50.00    Types: Cigarettes  . Smokeless tobacco: Former Systems developer  . Alcohol use 17.5 oz/week    35 Standard drinks or equivalent per week     Comment: at least 6 beers or more a day,  last etoh was august 2017     Allergies   Review of patient's allergies indicates no known allergies.   Review of Systems Review of Systems  Constitutional: Negative for fever.  HENT: Negative for sinus pressure.   Respiratory: Positive for cough and shortness of breath.   Cardiovascular: Negative for chest pain.  Gastrointestinal: Negative for abdominal pain.  Genitourinary: Negative for dysuria.  Neurological: Positive for weakness. Negative for tremors.  Psychiatric/Behavioral: Negative  for confusion.  All other systems reviewed and are negative.    Physical Exam Updated Vital Signs BP 103/70   Pulse 98   Temp 97.1 F (36.2 C) (Temporal) Comment: oral temp did not register  Resp (!) 29   SpO2 92%   Physical Exam  Constitutional: No distress.  Frail   HENT:  Head: Normocephalic and atraumatic.  Right Ear: External ear normal.  Left Ear: External ear normal.  Mouth/Throat: No oropharyngeal exudate.  Eyes: Conjunctivae are normal. Right eye exhibits no discharge. Left eye exhibits no discharge. No scleral icterus.  Neck: Neck supple. No tracheal deviation present.  Healing trach, no purulence  Cardiovascular: Normal rate, regular rhythm and  intact distal pulses.   Pulmonary/Chest: Effort normal. No stridor. No respiratory distress. He has no wheezes. He has rhonchi. He has rales.  Abdominal: Soft. Bowel sounds are normal. He exhibits no distension. There is no tenderness. There is no rebound and no guarding.  Musculoskeletal: He exhibits no edema or tenderness.  Neurological: He is alert. He has normal strength. No cranial nerve deficit (no facial droop, extraocular movements intact, no slurred speech) or sensory deficit. He exhibits normal muscle tone. He displays no seizure activity. Coordination normal.  Skin: Skin is warm and dry. No rash noted. He is not diaphoretic.  Psychiatric: He has a normal mood and affect.  Nursing note and vitals reviewed.    ED Treatments / Results  Labs (all labs ordered are listed, but only abnormal results are displayed) Labs Reviewed  COMPREHENSIVE METABOLIC PANEL - Abnormal; Notable for the following:       Result Value   Sodium 126 (*)    Chloride 92 (*)    CO2 17 (*)    Glucose, Bld 138 (*)    BUN 30 (*)    Creatinine, Ser 1.82 (*)    Total Bilirubin 1.6 (*)    GFR calc non Af Amer 34 (*)    GFR calc Af Amer 39 (*)    Anion gap 17 (*)    All other components within normal limits  CBC WITH DIFFERENTIAL/PLATELET  - Abnormal; Notable for the following:    WBC 17.0 (*)    RBC 3.29 (*)    Hemoglobin 10.7 (*)    HCT 32.9 (*)    RDW 17.1 (*)    Platelets 428 (*)    Neutro Abs 12.2 (*)    Monocytes Absolute 1.4 (*)    All other components within normal limits  BLOOD GAS, ARTERIAL - Abnormal; Notable for the following:    pCO2 arterial 27.5 (*)    pO2, Arterial 57.9 (*)    Bicarbonate 17.5 (*)    Acid-base deficit 8.8 (*)    All other components within normal limits  CULTURE, BLOOD (ROUTINE X 2)  CULTURE, BLOOD (ROUTINE X 2)  URINE CULTURE  URINALYSIS, ROUTINE W REFLEX MICROSCOPIC (NOT AT [facility])  I-STAT CG4 LACTIC ACID, ED    EKG  EKG Interpretation  Date/Time:  Wednesday February 28 2016 16:28:14 EDT Ventricular Rate:  105 PR Interval:    QRS Duration: 231 QT Interval:  400 QTC Calculation: 529 R Axis:   -97 Text Interpretation:  Atrial fibrillation RBBB and LAFB Since last tracing rate slower Confirmed by Darriana Deboy  MD-J, Meris Reede [id number]) on 02/27/2016 4:33:27 PM       Radiology Dg Chest 2 View  Result Date: 03/15/2016 CLINICAL DATA:  Shortness of breath for 1 week EXAM: CHEST  2 VIEW COMPARISON:  02/15/2016 FINDINGS: Cardiac shadow is enlarged. Aortic calcifications are again seen. Findings of prior granulomatous disease are noted. Small left pleural effusion is again identified. The right-sided effusion has resolved in the interval. Patchy changes are noted in the left lung base consistent with early infiltrate. These have increased in the interval from the prior exam. IMPRESSION: Left lower lobe infiltrate with associated effusion. Electronically Signed   By: Inez Catalina M.D.   On: 03/04/2016 18:12    Procedures .Critical Care Performed by: Dorie Rank Authorized by: Dorie Rank   Critical care provider statement:    Critical care time (minutes):  45   Critical care was necessary to treat or prevent imminent or life-threatening deterioration  of the following conditions:  Sepsis    Critical care was time spent personally by me on the following activities:  Discussions with consultants, evaluation of patient's response to treatment, examination of patient, ordering and performing treatments and interventions, ordering and review of laboratory studies, ordering and review of radiographic studies, pulse oximetry, re-evaluation of patient's condition, obtaining history from patient or surrogate and review of old charts    (including critical care time)  Medications Ordered in ED Medications  sodium chloride 0.9 % bolus 1,000 mL (not administered)    And  sodium chloride 0.9 % bolus 250 mL (not administered)  sodium chloride 0.9 % bolus 1,000 mL (1,000 mLs Intravenous New Bag/Given 03/17/2016 1827)  ceFEPIme (MAXIPIME) 2 g in dextrose 5 % 50 mL IVPB (2 g Intravenous New Bag/Given 03/14/2016 1828)  vancomycin (VANCOCIN) IVPB 1000 mg/200 mL premix (1,000 mg Intravenous New Bag/Given 02/22/2016 1828)     Initial Impression / Assessment and Plan / ED Course  I have reviewed the triage vital signs and the nursing notes.  Pertinent labs & imaging results that were available during my care of the patient were reviewed by me and considered in my medical decision making (see chart for details).  Clinical Course  Comment By Time  The patient is noted to have a lactate>4. With the current information available to me, I don't think the patient is in septic shock. The lactate>4.  Pt does not have a fever, there is no definite infection at this time.  He is not hypotensive. Will monitor closely, order a fluid bolus  Dorie Rank, MD 10/11 1657  WBC is elevated.  CXR shows pna.  Pt was treated for sepsis with fluid boluses and IV antibiotics.    Labs show metabolic acidosis and hyponatremia.  ABG with hypoxia. Plan on admisson to the hospital for further treatment. Dorie Rank, MD 10/11 1844   BP remains stable.  Will consult for admssion for sepsis and HCAP.  Initial lactic acid greater than 7.   BP has remained stable.  30cc fluid bolus ordered in total.  Discussed with Dr Marin Comment who will evaluate the patient in the ED   Final Clinical Impressions(s) / ED Diagnoses   Final diagnoses:  Healthcare-associated pneumonia  Sepsis, due to unspecified organism [facility])  Hyponatremia  Metabolic acidosis     Dorie Rank, MD 03/13/2016 1846  Added hyponatremia and metabolic acidosis dx    Dorie Rank, MD 03/15/2016 1942

## 2016-02-28 NOTE — ED Notes (Signed)
Xray called and stated when they stood patient up for chest xray, patient became weak and they slid him to the floor. States patient has skin tear to right elbow. Patient has skin tear to right elbow and left wrist at this time. Covered areas with 4x4 sterile gauze and wrapped with sterile gauze. Patient tolerated well.

## 2016-02-28 NOTE — ED Triage Notes (Signed)
Chad Rogers reports pt fell in August and had multiple fractures and a collapsed lung.  Reports was sent from here to [facility] hospital and was discharged Friday.  Pt also had a tracheostomy while at [facility] and now has a stoma.  Pt reports sob, inability to sleep, back pain, and left rib pain. Pt's 02 sat in triage was 62% initially. Pt 77% in room on room air.

## 2016-02-28 NOTE — H&P (Signed)
History and Physical    Chad Rogers [id number] DOB: 08-22-1935 DOA: 03/03/2016  PCP: [name], MD  Patient coming from: Home   Chief Complaint:   SOB.   HPI: Chad Rogers is an 80 y.o. male with multiple medical problems including chronic afib not on anticoagulation, hx of pulmonary fibrosis, systolic CHF 123456, HTN, HLD, debility, tobacco abuse, admitted after a fall last month, Fx several ribs, intubated, trached, and subsequently improved, went to [place], rehab, then went home a week ago, presented to the ER today with SOB.  He had nausea and vomiting, and feeling week last week, but it resolved.  He had a BM yesterday, having no abdominal pain.  He has been feeling more SOB, and having increased DOE, without CP, fever or chills.  In the ER, he was found to be hypotensive, hypoxic, and elevated lactic acid to 7.  He responded to IVF, with resultant SBP 116, and oxygen supplementation bringing his sat to 95%.  He was found to have an infiltrate on CXR and an effusion, along with marked leukocytosis with WBC of 17K, and mild acidotic with increased AG of 17, presumably from lactic acidosis.  He was thought to be volume depleted, with AKI and Cr of 1.8.   He was given IVF, and hospitalist was asked to admit him for sepsis, due to HCAP, volume depletion, and hyponatremia with Na of 126.   ED Course:  See above.  Rewiew of Systems:  Constitutional: Negative for malaise, fever and chills. No significant weight loss or weight gain Eyes: Negative for eye pain, redness and discharge, diplopia, visual changes, or flashes of light. ENMT: Negative for ear pain, hoarseness, nasal congestion, sinus pressure and sore throat. No headaches; tinnitus, drooling, or problem swallowing. Cardiovascular: Negative for chest pain, palpitations, diaphoresis, dyspnea and peripheral edema. ; No orthopnea, PND Respiratory: Negative for cough, hemoptysis, wheezing and stridor. No pleuritic  chestpain. Gastrointestinal: Negative for diarrhea, constipation,  melena, blood in stool, hematemesis, jaundice and rectal bleeding.    Genitourinary: Negative for frequency, dysuria, incontinence,flank pain and hematuria; Musculoskeletal: Negative for back pain and neck pain. Negative for swelling and trauma.;  Skin: . Negative for pruritus, rash, abrasions, bruising and skin lesion.; ulcerations Neuro: Negative for headache, lightheadedness and neck stiffness. Negative for weakness, altered level of consciousness , altered mental status, extremity weakness, burning feet, involuntary movement, seizure and syncope.  Psych: negative for anxiety, depression, insomnia, tearfulness, panic attacks, hallucinations, paranoia, suicidal or homicidal ideation    Past Medical History:  Diagnosis Date  . Alcohol abuse    family reports that [pt will drink at least 6 beers or more a day,   . Allergic rhinitis   . Cardiomyopathy [id number]   Acute systolic CHF at presentation; 05/2009 cardiac cath- 06/01/09  non obst coronary artery disease with EF 25%  . CHF (congestive heart failure) ([place])   . COPD (chronic obstructive pulmonary disease) ([place])   . Coronary artery disease   . Hypertension   . Osteoarthritis   . Psoriasis   . Pulmonary fibrosis ([place])    12/2004 with basilar fibrotic changes;  PFT's 06/07/09 FEV1 1.98 (68%) raio 53 no better after B2,  DLC0105%  . Skin cancer   . Subdural hygroma 03/2005   bilateral   . Tobacco abuse    100 pack years; quit    Rewiew of Systems:  Constitutional: Negative for malaise, fever and chills. No significant weight loss or weight gain Eyes: Negative for eye pain,  redness and discharge, diplopia, visual changes, or flashes of light. ENMT: Negative for ear pain, hoarseness, nasal congestion, sinus pressure and sore throat. No headaches; tinnitus, drooling, or problem swallowing. Cardiovascular: Negative for chest pain, palpitations, diaphoresis, dyspnea and  peripheral edema. ; No orthopnea, PND Respiratory: Negative for cough, hemoptysis, wheezing and stridor. No pleuritic chestpain. Gastrointestinal: Negative for nausea, vomiting, diarrhea, constipation, abdominal pain, melena, blood in stool, hematemesis, jaundice and rectal bleeding.    Genitourinary: Negative for frequency, dysuria, incontinence,flank pain and hematuria; Musculoskeletal: Negative for back pain and neck pain. Negative for swelling and trauma.;  Skin: . Negative for pruritus, rash, abrasions, bruising and skin lesion.; ulcerations Neuro: Negative for headache, lightheadedness and neck stiffness. Negative for weakness, altered level of consciousness , altered mental status, extremity weakness, burning feet, involuntary movement, seizure and syncope.  Psych: negative for anxiety, depression, insomnia, tearfulness, panic attacks, hallucinations, paranoia, suicidal or homicidal ideation   Past Surgical History:  Procedure Laterality Date  . CHEST TUBE INSERTION    . HAND SURGERY    . INGUINAL HERNIA REPAIR    . PERCUTANEOUS TRACHEOSTOMY N/A 01/10/2016   Procedure: PERCUTANEOUS TRACHEOSTOMY;  Surgeon: Judeth Horn, MD;  Location: [facility];  Service: General;  Laterality: N/A;  . ROTATOR CUFF REPAIR       reports that he has been smoking Cigarettes.  He has a 100.00 pack-year smoking history. He has quit using smokeless tobacco. He reports that he drinks about 17.5 oz of alcohol per week . He reports that he does not use drugs.  No Known Allergies  Family History  Problem Relation Age of Onset  . Cancer Mother 30    gynecologic  . Aneurysm Father 36    cerebral aneurysm  . Asthma Maternal Grandmother      Prior to Admission medications   Medication Sig Start Date End Date Taking? Authorizing Provider  acetaminophen (TYLENOL) 325 MG tablet Take 1-2 tablets (325-650 mg total) by mouth every 6 (six) hours as needed for mild pain. Max 2,000 mg /daily 02/21/16  Yes Ivan Anchors Love,  PA-C  amiodarone (PACERONE) 100 MG tablet Take 1 tablet (100 mg total) by mouth daily. 02/24/16  Yes Ivan Anchors Love, PA-C  fluticasone furoate-vilanterol (BREO ELLIPTA) 100-25 MCG/INH AEPB Inhale 1 puff into the lungs daily. 02/24/16  Yes Ivan Anchors Love, PA-C  folic acid (FOLVITE) 1 MG tablet Take 1 tablet (1 mg total) by mouth daily. 01/19/16  Yes Jerrye Beavers, PA-C  levalbuterol (XOPENEX) 0.63 MG/3ML nebulizer solution Take 3 mLs (0.63 mg total) by nebulization every 6 (six) hours as needed for wheezing or shortness of breath. 01/19/16  Yes Jerrye Beavers, PA-C  mirtazapine (REMERON) 15 MG tablet Take 1 tablet (15 mg total) by mouth at bedtime. 02/23/16  Yes Ivan Anchors Love, PA-C  Multiple Vitamin (MULTIVITAMIN WITH MINERALS) TABS tablet Take 1 tablet by mouth daily. 01/19/16  Yes Jerrye Beavers, PA-C  pantoprazole (PROTONIX) 40 MG tablet Take 1 tablet (40 mg total) by mouth daily. 02/24/16  Yes Ivan Anchors Love, PA-C  QUEtiapine (SEROQUEL) 50 MG tablet Take 1 tablet (50 mg total) by mouth at bedtime. 02/23/16  Yes Ivan Anchors Love, PA-C  senna-docusate (SENOKOT-S) 8.6-50 MG tablet Take 1 tablet by mouth at bedtime. 02/23/16  Yes Ivan Anchors Love, PA-C  sertraline (ZOLOFT) 50 MG tablet Take 1.5 tablets (75 mg total) by mouth daily. 02/24/16  Yes Ivan Anchors Love, PA-C  thiamine 100 MG tablet Take 1 tablet (100 mg total)  by mouth daily. 01/19/16  Yes Jerrye Beavers, PA-C  umeclidinium bromide (INCRUSE ELLIPTA) 62.5 MCG/INH AEPB Inhale 1 puff into the lungs daily. 02/24/16  Yes Ivan Anchors Love, PA-C  furosemide (LASIX) 20 MG tablet Take 1 tablet (20 mg total) by mouth 2 (two) times daily. Patient not taking: Reported on 03/17/2016 02/23/16   Ivan Anchors Love, PA-C  Nutritional Supplements (FEEDING SUPPLEMENT, VITAL AF 1.2 CAL,) LIQD Place 1,000 mLs into feeding tube continuous. 01/19/16   Jerrye Beavers, PA-C  potassium chloride (K-DUR,KLOR-CON) 10 MEQ tablet Take 1 tablet (10 mEq total) by mouth 2 (two) times daily. Patient not  taking: Reported on 03/19/2016 02/23/16   Bary Leriche, PA-C    Physical Exam: Vitals:   02/21/2016 1730 03/13/2016 1830 02/22/2016 1900 02/27/2016 1930  BP: 100/68 114/70 103/70 116/66  Pulse: 96 92 98 91  Resp: (!) 30 (!) 29 (!) 29 (!) 30  Temp:      TempSrc:      SpO2: (!) 88% 91% 92% 94%      Constitutional: NAD, calm, comfortable Vitals:   02/29/2016 1730 03/19/2016 1830 02/20/2016 1900 03/14/2016 1930  BP: 100/68 114/70 103/70 116/66  Pulse: 96 92 98 91  Resp: (!) 30 (!) 29 (!) 29 (!) 30  Temp:      TempSrc:      SpO2: (!) 88% 91% 92% 94%   Eyes: PERRL, lids and conjunctivae normal ENMT: Mucous membranes are moist. Posterior pharynx clear of any exudate or lesions.Normal dentition.  Neck: normal, supple, no masses, no thyromegaly Respiratory: clear to auscultation bilaterally, no wheezing, no crackles. Normal respiratory effort. No accessory muscle use.  Cardiovascular: Regular rate and rhythm, no murmurs / rubs / gallops. No extremity edema. 2+ pedal pulses. No carotid bruits.  Abdomen: no tenderness, no masses palpated. No hepatosplenomegaly. Bowel sounds positive.  Musculoskeletal: no clubbing / cyanosis. No joint deformity upper and lower extremities. Good ROM, no contractures. Normal muscle tone.  Skin: no rashes, lesions, ulcers. No induration Neurologic: CN 2-12 grossly intact. Sensation intact, DTR normal. Strength 5/5 in all 4.  Psychiatric: Normal judgment and insight. Alert and oriented x 3. Normal mood.   CBC:  Recent Labs Lab 02/22/2016 1705  WBC 17.0*  NEUTROABS 12.2*  HGB 10.7*  HCT 32.9*  MCV 100.0  PLT 123456*   Basic Metabolic Panel:  Recent Labs Lab 03/03/2016 1705  NA 126*  K 4.5  CL 92*  CO2 17*  GLUCOSE 138*  BUN 30*  CREATININE 1.82*  CALCIUM 9.3   GFR: Estimated Creatinine Clearance: 30.8 mL/min (by C-G formula based on SCr of 1.82 mg/dL (H)). Liver Function Tests:  Recent Labs Lab 03/19/2016 1705  AST 32  ALT 33  ALKPHOS 95  BILITOT 1.6*   PROT 7.2  ALBUMIN 3.7   Urine analysis:    Component Value Date/Time   COLORURINE YELLOW 01/21/2016 1209   APPEARANCEUR CLEAR 01/21/2016 1209   LABSPEC 1.014 01/21/2016 1209   PHURINE 8.0 01/21/2016 1209   GLUCOSEU NEGATIVE 01/21/2016 1209   HGBUR NEGATIVE 01/21/2016 1209   HGBUR negative 02/23/2008 1634   BILIRUBINUR NEGATIVE 01/21/2016 1209   KETONESUR NEGATIVE 01/21/2016 1209   PROTEINUR NEGATIVE 01/21/2016 1209   UROBILINOGEN negative 02/23/2008 1634   NITRITE NEGATIVE 01/21/2016 1209   LEUKOCYTESUR NEGATIVE 01/21/2016 1209   Radiological Exams on Admission: Dg Chest 2 View  Result Date: 02/27/2016 CLINICAL DATA:  Shortness of breath for 1 week EXAM: CHEST  2 VIEW COMPARISON:  02/15/2016 FINDINGS:  Cardiac shadow is enlarged. Aortic calcifications are again seen. Findings of prior granulomatous disease are noted. Small left pleural effusion is again identified. The right-sided effusion has resolved in the interval. Patchy changes are noted in the left lung base consistent with early infiltrate. These have increased in the interval from the prior exam. IMPRESSION: Left lower lobe infiltrate with associated effusion. Electronically Signed   By: Inez Catalina M.D.   On: 02/18/2016 18:12    EKG: Independently reviewed.  Assessment/Plan Principal Problem:   Sepsis ([place]) Active Problems:   Hypertension   Pulmonary fibrosis (H[facility])   Tobacco abuse   Traumatic fracture of ribs with pneumothorax   Multiple rib fractures   Chronic systolic CHF (congestive heart failure) (H[facility])   Atrial fibrillation, persistent ([place])   Debility   HCAP (healthcare-associated pneumonia)    PLAN:   1/ Sepsis:  Will give IVF, and be cautious given his hx of CHF.  Follow up with lactic acid.  Source is from HCAP.  Continue with IV Van and Cefepime.  Doesn't need atypical coverage I don't think.  Give supplemental oxygen.  Hold off on steroids, as he is not requiring pressor at this time.    2/  HCAP:   Antibiotics as noted.    3/ hx of CHF:  His EF is 25%, but I think he is dry and septic.  Will give 2.4 L over the next 24 hours.  Careful of his CHF.   4/  AKI:  Pre-renal likely.  Will give IVF.  He has been on ACE I before, but it was discontinued due to soft BP.  Follow Cr.  Suspect improvement.   5/  Pulmonary fibrosis:  Consider steroid if he is not better.  6/   Afib:  Continue Amiodarone.  Dig was discontinued.  HR is controlled at this time.  Note that he was on BB before, and it was discontinued.   7/  Tobacco abuse:  Advised stop.  8/  Rib Fx and hx of pneumothorax:  Stable.  Will give Percocet PRN.     DVT prophylaxis: Hep SQ. Code Status: FULL CODE.  Family Communication: [place] daughter. Disposition Plan: Unclear.   Probably home when better.  Consults called: None.  Admission status: Inpatient due to sepsis, respiratory failure, hypotension, AKI.  He will require several days of treatment.    Peytan Andringa MD FACP. [facility]  If 7PM-7AM, please contact night-coverage [email or web address] Password [email or web address]  02/24/2016, 7:55 PM

## 2016-02-28 NOTE — Progress Notes (Signed)
eLink Physician-Brief Progress Note Patient Name: Chad Rogers DOB: 03-12-1936 MRN: [id number]   Date of Service  03/05/2016  HPI/Events of Note  80 yo man, admitted with sepsis due to HCAP. Hemodynamically stable at this time.  98% on [place] O2.  Lactate apparently not done, or at least not in the results. Will try to find the result  eICU Interventions       Intervention Category Evaluation Type: New Patient Evaluation  Tonnie Stillman S. 03/19/2016, 10:09 PM

## 2016-02-28 NOTE — Progress Notes (Signed)
Pharmacy Antibiotic Note  EMANUAL BIENAIME is a 80 y.o. male admitted on 02/19/2016 withPpneumonia.  Pharmacy has been consulted for vancomycin and cefepime dosing.  Plan: Vancomycin 1000 mg IV every 24 hours.  Goal trough 15-20 mcg/mL. Cefepime 2gm loading dose given in ED, then 1gm IV q24h F/U cultures Monitor V/S and clinical progress Vancomycin trough levels as indicated    Temp (24hrs), Avg:97.1 F (36.2 C), Min:97.1 F (36.2 C), Max:97.1 F (36.2 C)   Recent Labs Lab 03/05/2016 1705  WBC 17.0*  CREATININE 1.82*    Estimated Creatinine Clearance: 30.8 mL/min (by C-G formula based on SCr of 1.82 mg/dL (H)).    No Known Allergies  Antimicrobials this admission: Vancomycin 10/11 >>  Cefepime 10/11 >>   Dose adjustments this admission: n/a  Microbiology results: 10/11 BCx: pending 10/11 UCx: pending  Thank you for allowing pharmacy to be a part of this patient's care.  Isac Sarna, BS [place], [place] Clinical Pharmacist Pager [phone]  03/02/2016 8:56 PM

## 2016-02-29 DIAGNOSIS — I10 Essential (primary) hypertension: Secondary | ICD-10-CM

## 2016-02-29 LAB — COMPREHENSIVE METABOLIC PANEL
ALT: 45 U/L (ref 17–63)
ANION GAP: 9 (ref 5–15)
AST: 33 U/L (ref 15–41)
Albumin: 3.1 g/dL — ABNORMAL LOW (ref 3.5–5.0)
Alkaline Phosphatase: 81 U/L (ref 38–126)
BUN: 31 mg/dL — ABNORMAL HIGH (ref 6–20)
CALCIUM: 8.2 mg/dL — AB (ref 8.9–10.3)
CHLORIDE: 97 mmol/L — AB (ref 101–111)
CO2: 20 mmol/L — AB (ref 22–32)
Creatinine, Ser: 1.59 mg/dL — ABNORMAL HIGH (ref 0.61–1.24)
GFR calc non Af Amer: 40 mL/min — ABNORMAL LOW (ref >60)
GFR, EST AFRICAN AMERICAN: 46 mL/min — AB (ref >60)
Glucose, Bld: 139 mg/dL — ABNORMAL HIGH (ref 65–99)
Potassium: 4.3 mmol/L (ref 3.5–5.1)
SODIUM: 126 mmol/L — AB (ref 135–145)
Total Bilirubin: 1.3 mg/dL — ABNORMAL HIGH (ref 0.3–1.2)
Total Protein: 6 g/dL — ABNORMAL LOW (ref 6.5–8.1)

## 2016-02-29 LAB — LACTIC ACID, PLASMA
LACTIC ACID, VENOUS: 1.9 mmol/L (ref 0.5–1.9)
Lactic Acid, Venous: 2 mmol/L (ref 0.5–1.9)

## 2016-02-29 LAB — CBC
HCT: 26.6 % — ABNORMAL LOW (ref 39.0–52.0)
HEMOGLOBIN: 8.7 g/dL — AB (ref 13.0–17.0)
MCH: 32.3 pg (ref 26.0–34.0)
MCHC: 32.7 g/dL (ref 30.0–36.0)
MCV: 98.9 fL (ref 78.0–100.0)
Platelets: 323 10*3/uL (ref 150–400)
RBC: 2.69 MIL/uL — AB (ref 4.22–5.81)
RDW: 16.9 % — ABNORMAL HIGH (ref 11.5–15.5)
WBC: 13.1 10*3/uL — AB (ref 4.0–10.5)

## 2016-02-29 LAB — POCT I-STAT TROPONIN I: TROPONIN I, POC: 0.04 ng/mL (ref 0.00–0.08)

## 2016-02-29 LAB — CG4 I-STAT (LACTIC ACID): LACTIC ACID, VENOUS: 7.37 mmol/L — AB (ref 0.5–1.9)

## 2016-02-29 MED ORDER — HYDROCORTISONE 1 % EX CREA
TOPICAL_CREAM | Freq: Two times a day (BID) | CUTANEOUS | Status: DC
  Administered 2016-02-29 – 2016-03-01 (×3): 1 via TOPICAL
  Administered 2016-03-02 – 2016-03-07 (×8): via TOPICAL
  Administered 2016-03-07: 1 via TOPICAL
  Administered 2016-03-08: 10:00:00 via TOPICAL
  Administered 2016-03-08: 1 via TOPICAL
  Administered 2016-03-09 (×2): via TOPICAL
  Administered 2016-03-10: 1 via TOPICAL
  Administered 2016-03-10 – 2016-03-13 (×6): via TOPICAL
  Administered 2016-03-14 – 2016-03-15 (×2): 1 via TOPICAL
  Administered 2016-03-16: 14:00:00 via TOPICAL
  Filled 2016-02-29 (×2): qty 28

## 2016-02-29 NOTE — Progress Notes (Signed)
Initial Nutrition Assessment  DOCUMENTATION CODES:   Severe malnutrition in context of acute illness/injury  INTERVENTION:  Ensure Enlive po BID, each supplement provides 350 kcal and 20 grams of protein   Heart Healthy diet -please assist with feeding to potentially maximize his intake  NUTRITION DIAGNOSIS:   Inadequate oral intake related to poor appetite as evidenced by energy intake < or equal to 50% for > or equal to 5 days , unplanned wt loss >5% in < 1 month.  GOAL:   Provide needs based on ASPEN/SCCM guidelines   MONITOR:   PO intake, Supplement acceptance, Labs, Weight trends  REASON FOR ASSESSMENT:   Malnutrition Screening Tool    ASSESSMENT:  Chad Rogers is a 80 yo male with h/o COPD, pulmonary fibrosis, CAD with ICM--EF 25%, CHF, ETOH abuse, HTN. He had a fall  on 12/28/15 with resulting 3rd- 12th rib fractures and was taken to [facility]  He developed hypotension with A fib, CHF and respiratory failure and was intubated. He had problems with weaning and had a tracheostomy and enteral feeding during this time.  He was transferred to [facility] for vent wean and rehab. He tolerated extubation and was decanulated by 9/22.  Diet advanced to regular textures, thin by 9/26.   Patient was discharged from [place] on 10/6-. Pt says his appetite and intake has been very poor. He is able to feed himself but just doesn't want anything.  His meal intake today 0-25%. His weight has decreased significantly 6.4% in <30 d.  NFPE: pt has moderate muscle and fat mass depletions and edema. He meets criteria for severe malnutrition.   Labs: Sodium 126, BUN 31, Cr. 1.59, Glucose 139   Meds/vitmin suppl: remeron, MVI, folic acid, thiamine and senakot.  Diet Order:  Diet Heart Room service appropriate? Yes; Fluid consistency: Thin  Skin:   dry, intact  Last BM:  10/10 soft stool  Height:   Ht Readings from Last 1 Encounters:  03/11/2016 5\' 10"  (1.778 m)    Weight:   Wt Readings  from Last 1 Encounters:  02/29/16 150 lb 5.7 oz (68.2 kg)    Ideal Body Weight:  75 kg  BMI:  Body mass index is 21.57 kg/m.  Estimated Nutritional Needs:   Kcal:  [id number]  Protein:  81-89 gr  Fluid:  1700 ml / day  EDUCATION NEEDS:   Education needs no appropriate at this time  Colman Cater MS,RD,CSG,LDN Office: [phone] Pager: [phone]

## 2016-02-29 NOTE — Care Management Note (Signed)
Case Management Note  Patient Details  Name: Chad Rogers MRN: [id number] Date of Birth: 09-18-1935  Subjective/Objective:                  Pt admitted with sepsis. He is from home, lives with his wife and grandson. He uses a walker for mobility and is ind with ADL's. Pt has no oxygen PTA and is active with [facility] for nursing and PT services. Romualdo Bolk, of [facility], is aware of admission. Pt plans to return home with self care. Pt will need PT eval prior to DC.   Action/Plan: Will cont to follow.   Expected Discharge Date:    03/03/2016              Expected Discharge Plan:  [facility] ([facility])  In-House Referral:  NA  Discharge planning Services  CM Consult  Post Acute Care Choice:  Home Health, Resumption of Svcs/PTA Provider Choice offered to:  Patient  Status of Service:  In process, will continue to follow   Sherald Barge, RN 02/29/2016, 2:00 PM

## 2016-02-29 NOTE — Progress Notes (Signed)
PROGRESS NOTE    Chad Rogers  [id number] DOB: 1935-09-07 DOA: 03/06/2016 PCP: [name], MD    Brief Narrative:   80 y.o. male with multiple medical problems including chronic afib not on anticoagulation, hx of pulmonary fibrosis, systolic CHF 123456, HTN, HLD, debility, tobacco abuse, admitted after a fall last month, Fx several ribs, intubated, trached, and subsequently improved, went to [facility], rehab, patient was then discharged home one week patient presented to the emergency department with complaints of worsening shortness of breath. Patient was found to be hypotensive, hypoxic with an elevated lactic acid of 7 area patient was found to have evidence of pneumonia on chest x-ray with severe sepsis. She was admitted for further workup  Assessment & Plan:   Principal Problem:   Sepsis ([place]) Active Problems:   Hypertension   Pulmonary fibrosis ([place])   Tobacco abuse   Traumatic fracture of ribs with pneumothorax   Multiple rib fractures   Chronic systolic CHF (congestive heart failure) (HCC)   Atrial fibrillation, persistent ([place])   Debility   HCAP (healthcare-associated pneumonia)   1. Healthcare associated pneumonia with sepsis present on admission 1. Patient presented hypotensive with elevated lactic acid mom a leukocytosis and chest x-ray findings suggestive of pneumonia 2. Patient is continued on empiric vancomycin and cefepime 3. Lactic acid has normalized with IV fluids 4. Given low EF, will avoid overhydration with the tip patient into acute heart failure 5. We'll repeat CBC in the morning 6. I'll order repeat chest x-ray in morning for interval change 2. Hypertension 1. Patient presented hypotensive, likely in septic shock 2. Blood pressure seems to have improved with IV fluids 3. Continue to hold blood pressure medications for the time being 3. Pulmonary fibrosis 1. Followed by Dr. Melvyn Novas as an outpatient 2. Continue O2 as needed 4. COPD 1. No audible wheezing  at the time of my exam this morning 2. Patient is continued on when necessary Xopenex 5. Cardiomyopathy with Chronic systolic CHF with EF of [id number] 1. Patient presented clinically dry and septic, both requiring IV fluids 2. Lactate has since normalized 3. Avoid overhydration as per above 6. Prior tobacco abuse 1. Stable at this time 7. CAD 1. No chest pains at present 2. Seems stable this morning 8. Lactic acidosis 1. Labs reviewed . Presenting lactate of 7, has trended down to normal this morning  DVT prophylaxis: Heparin subcutaneous Code Status: Full code Family Communication: Patient in room, family at bedside Disposition Plan: Uncertain at this time  Consultants:     Procedures:     Antimicrobials: Anti-infectives    Start     Dose/Rate Route Frequency Ordered Stop   02/29/16 1800  ceFEPIme (MAXIPIME) 1 g in dextrose 5 % 50 mL IVPB     1 g 100 mL/hr over 30 Minutes Intravenous Every 24 hours 03/11/2016 2103     02/29/16 1800  vancomycin (VANCOCIN) IVPB 1000 mg/200 mL premix     1,000 mg 200 mL/hr over 60 Minutes Intravenous Every 24 hours 02/20/2016 2103     03/07/2016 1800  ceFEPIme (MAXIPIME) 2 g in dextrose 5 % 50 mL IVPB     2 g 100 mL/hr over 30 Minutes Intravenous  Once 03/12/2016 1746 03/15/2016 2044   03/12/2016 1800  vancomycin (VANCOCIN) IVPB 1000 mg/200 mL premix     1,000 mg 200 mL/hr over 60 Minutes Intravenous  Once 03/02/2016 1746 02/26/2016 1948      Subjective: No complaints  Objective: Vitals:   02/29/16 1200  02/29/16 1500 02/29/16 1600 02/29/16 1630  BP: 102/75  (!) 87/52   Pulse: 95 99    Resp: (!) 24 (!) 25    Temp:    97.8 F (36.6 C)  TempSrc:    Axillary  SpO2: 100% 100%    Weight:      Height:        Intake/Output Summary (Last 24 hours) at 02/29/16 1822 Last data filed at 02/29/16 1500  Gross per 24 hour  Intake             1420 ml  Output                0 ml  Net             1420 ml   Filed Weights   03/04/2016 2127 02/29/16 0500    Weight: 68.2 kg (150 lb 5.7 oz) 68.2 kg (150 lb 5.7 oz)    Examination:  General exam: Lying in bed, no acute distress Respiratory system: Mildly increased respiratory effort, coarse breath sounds Cardiovascular system: Regular rate, S1-S2 Gastrointestinal system: Soft, nondistended Central nervous system: Cranial nerves II through XII grossly intact, no seizures. Extremities: Perfused, no clubbing. Skin: Normal skin turgor, no pallor Psychiatry: Mood normal, no visual hallucinations  Data Reviewed: I have personally reviewed following labs and imaging studies  CBC:  Recent Labs Lab 03/19/2016 1705 02/29/16 0545  WBC 17.0* 13.1*  NEUTROABS 12.2*  --   HGB 10.7* 8.7*  HCT 32.9* 26.6*  MCV 100.0 98.9  PLT 428* [id number]   Basic Metabolic Panel:  Recent Labs Lab 03/04/2016 1705 02/29/16 0545  NA 126* 126*  K 4.5 4.3  CL 92* 97*  CO2 17* 20*  GLUCOSE 138* 139*  BUN 30* 31*  CREATININE 1.82* 1.59*  CALCIUM 9.3 8.2*   GFR: Estimated Creatinine Clearance: 36.9 mL/min (by C-G formula based on SCr of 1.59 mg/dL (H)). Liver Function Tests:  Recent Labs Lab 03/03/2016 1705 02/29/16 0545  AST 32 33  ALT 33 45  ALKPHOS 95 81  BILITOT 1.6* 1.3*  PROT 7.2 6.0*  ALBUMIN 3.7 3.1*   No results for input(s): LIPASE, AMYLASE in the last 168 hours. No results for input(s): AMMONIA in the last 168 hours. Coagulation Profile: No results for input(s): INR, PROTIME in the last 168 hours. Cardiac Enzymes: No results for input(s): CKTOTAL, CKMB, CKMBINDEX, TROPONINI in the last 168 hours. BNP (last 3 results) No results for input(s): PROBNP in the last 8760 hours. HbA1C: No results for input(s): HGBA1C in the last 72 hours. CBG: No results for input(s): GLUCAP in the last 168 hours. Lipid Profile: No results for input(s): CHOL, HDL, LDLCALC, TRIG, CHOLHDL, LDLDIRECT in the last 72 hours. Thyroid Function Tests: No results for input(s): TSH, T4TOTAL, FREET4, T3FREE, THYROIDAB in  the last 72 hours. Anemia Panel: No results for input(s): VITAMINB12, FOLATE, FERRITIN, TIBC, IRON, RETICCTPCT in the last 72 hours. Sepsis Labs:  Recent Labs Lab 02/24/2016 1641 03/19/2016 2307 02/29/16 0944 02/29/16 1247  LATICACIDVEN 7.37* 3.1* 1.9 2.0*    Recent Results (from the past 240 hour(s))  Culture, blood (Routine x 2)     Status: None (Preliminary result)   Collection Time: 02/23/2016  4:51 PM  Result Value Ref Range Status   Specimen Description BLOOD RIGHT ARM  Final   Special Requests   Final    BOTTLES DRAWN AEROBIC AND ANAEROBIC AEB=12CC ANA=8CC   Culture NO GROWTH < 24 HOURS  Final   Report  Status PENDING  Incomplete  Culture, blood (Routine x 2)     Status: None (Preliminary result)   Collection Time: 03/19/2016  5:05 PM  Result Value Ref Range Status   Specimen Description BLOOD LEFT HAND  Final   Special Requests   Final    BOTTLES DRAWN AEROBIC AND ANAEROBIC AEB=5CC ANA=4CC   Culture NO GROWTH < 24 HOURS  Final   Report Status PENDING  Incomplete  Urine culture     Status: None (Preliminary result)   Collection Time: 02/20/2016  9:00 PM  Result Value Ref Range Status   Specimen Description URINE, CLEAN CATCH  Final   Special Requests NONE  Final   Culture PENDING  Incomplete   Report Status PENDING  Incomplete     Radiology Studies: Dg Chest 2 View  Result Date: 03/08/2016 CLINICAL DATA:  Shortness of breath for 1 week EXAM: CHEST  2 VIEW COMPARISON:  02/15/2016 FINDINGS: Cardiac shadow is enlarged. Aortic calcifications are again seen. Findings of prior granulomatous disease are noted. Small left pleural effusion is again identified. The right-sided effusion has resolved in the interval. Patchy changes are noted in the left lung base consistent with early infiltrate. These have increased in the interval from the prior exam. IMPRESSION: Left lower lobe infiltrate with associated effusion. Electronically Signed   By: Inez Catalina M.D.   On: 02/24/2016 18:12     Scheduled Meds: . amiodarone  100 mg Oral Daily  . ceFEPime (MAXIPIME) IV  1 g Intravenous Q24H  . docusate sodium  100 mg Oral BID  . fluticasone furoate-vilanterol  1 puff Inhalation Daily  . folic acid  1 mg Oral Daily  . heparin  5,000 Units Subcutaneous Q8H  . hydrocortisone cream   Topical BID  . mirtazapine  15 mg Oral QHS  . multivitamin with minerals  1 tablet Oral Daily  . pantoprazole  40 mg Oral Daily  . potassium chloride  10 mEq Oral BID  . QUEtiapine  50 mg Oral QHS  . senna-docusate  1 tablet Oral QHS  . sertraline  75 mg Oral Daily  . sodium chloride flush  3 mL Intravenous Q12H  . thiamine  100 mg Oral Daily  . umeclidinium bromide  1 puff Inhalation Daily  . vancomycin  1,000 mg Intravenous Q24H   Continuous Infusions: . dextrose 5 % and 0.9% NaCl 100 mL/hr at 02/29/16 0048     LOS: 1 day   Chad Rogers, Chad Melter, MD [facility] Pager [phone]  If 7PM-7AM, please contact night-coverage [email or web address] Password [facility] 02/29/2016, 6:22 PM

## 2016-03-01 ENCOUNTER — Inpatient Hospital Stay (HOSPITAL_COMMUNITY): Payer: Medicare Other

## 2016-03-01 LAB — COMPREHENSIVE METABOLIC PANEL
ALT: 72 U/L — ABNORMAL HIGH (ref 17–63)
AST: 48 U/L — AB (ref 15–41)
Albumin: 3.1 g/dL — ABNORMAL LOW (ref 3.5–5.0)
Alkaline Phosphatase: 86 U/L (ref 38–126)
Anion gap: 8 (ref 5–15)
BUN: 27 mg/dL — AB (ref 6–20)
CHLORIDE: 99 mmol/L — AB (ref 101–111)
CO2: 19 mmol/L — ABNORMAL LOW (ref 22–32)
Calcium: 8.4 mg/dL — ABNORMAL LOW (ref 8.9–10.3)
Creatinine, Ser: 1.23 mg/dL (ref 0.61–1.24)
GFR calc Af Amer: >60 mL/min (ref >60)
GFR, EST NON AFRICAN AMERICAN: 54 mL/min — AB (ref >60)
Glucose, Bld: 127 mg/dL — ABNORMAL HIGH (ref 65–99)
POTASSIUM: 4.1 mmol/L (ref 3.5–5.1)
Sodium: 126 mmol/L — ABNORMAL LOW (ref 135–145)
Total Bilirubin: 1.2 mg/dL (ref 0.3–1.2)
Total Protein: 6 g/dL — ABNORMAL LOW (ref 6.5–8.1)

## 2016-03-01 LAB — URINE CULTURE: Culture: NO GROWTH

## 2016-03-01 LAB — CBC
HEMATOCRIT: 28 % — AB (ref 39.0–52.0)
HEMOGLOBIN: 9 g/dL — AB (ref 13.0–17.0)
MCH: 32.8 pg (ref 26.0–34.0)
MCHC: 32.1 g/dL (ref 30.0–36.0)
MCV: 102.2 fL — AB (ref 78.0–100.0)
Platelets: 315 10*3/uL (ref 150–400)
RBC: 2.74 MIL/uL — AB (ref 4.22–5.81)
RDW: 16.6 % — ABNORMAL HIGH (ref 11.5–15.5)
WBC: 9.4 10*3/uL (ref 4.0–10.5)

## 2016-03-01 MED ORDER — SODIUM CHLORIDE 0.9 % IV SOLN
250.0000 mL | INTRAVENOUS | Status: DC | PRN
  Administered 2016-03-01: 250 mL via INTRAVENOUS

## 2016-03-01 MED ORDER — SODIUM CHLORIDE 0.9% FLUSH: 3.0000 mL | INTRAVENOUS | Status: DC | PRN

## 2016-03-01 NOTE — Progress Notes (Signed)
PROGRESS NOTE    Chad Rogers  [id number] DOB: 12-21-1935 DOA: 02/20/2016 PCP: [name], MD    Brief Narrative:   80 y.o. male with multiple medical problems including chronic afib not on anticoagulation, hx of pulmonary fibrosis, systolic CHF 123456, HTN, HLD, debility, tobacco abuse, admitted after a fall last month, Fx several ribs, intubated, trached, and subsequently improved, went to [facility], rehab, patient was then discharged home one week patient presented to the emergency department with complaints of worsening shortness of breath. Patient was found to be hypotensive, hypoxic with an elevated lactic acid of 7 area patient was found to have evidence of pneumonia on chest x-ray with severe sepsis. She was admitted for further workup  Assessment & Plan:   Principal Problem:   Sepsis ([place]) Active Problems:   Hypertension   Pulmonary fibrosis ([place])   Tobacco abuse   Traumatic fracture of ribs with pneumothorax   Multiple rib fractures   Chronic systolic CHF (congestive heart failure) (HCC)   Atrial fibrillation, persistent ([place])   Debility   HCAP (healthcare-associated pneumonia)   1. Healthcare associated pneumonia with severe sepsis with septic shock present on admission 1. Patient presented hypotensive and elevated lactic acid with leukocytosis in the setting of a chest x-ray with findings of pneumonia. 2. Lactate has improved with IV fluids 3. Patient is continued on empiric vancomycin and cefepime 4. IV fluids on hold currently secondary to underlying history of severe systolic heart failure 5. Repeat chest x-ray done this morning. I personally reviewed. Findings of acute heart failure (see below) 2. Hypertension 1. Blood pressure remains soft, albeit improved since admission 2. Blood pressure medications remain on hold 3. IV fluids now on hold (see below) 3. Pulmonary fibrosis 1. She is normally followed by pulmonary as an outpatient 2. We'll continue O2, wean as  tolerated 4. COPD 1. No audible wheezing on my exam 2. We'll continue Xopenex as needed 3. Continue O2 as needed 5. Cardiomyopathy with acutely decompensated Chronic systolic CHF with EF of 123456 1. Patient initially required IV fluids in the setting of severe sepsis with septic shock 2. Chest x-ray personally reviewed this morning. Patient has evidence of pulmonary edema, acute systolic heart failure 3. Hold further IV fluids for the time being 4. Continue monitor blood pressure, if and when patient's blood pressure allows, would consider giving Lasix 5. We'll check a comprehensive metabolic panel morning 6. Prior tobacco abuse 1. Patient stable 7. CAD 1. Denies chest pains at this time 8. Lactic acidosis 1. Improved with IV fluid hydration 2. Lactate has improved since admission  DVT prophylaxis: Heparin subcutaneous Code Status: Full code Family Communication: Patient in room, family in room Disposition Plan: Uncertain at this time  Consultants:     Procedures:     Antimicrobials: Anti-infectives    Start     Dose/Rate Route Frequency Ordered Stop   02/29/16 1800  ceFEPIme (MAXIPIME) 1 g in dextrose 5 % 50 mL IVPB     1 g 100 mL/hr over 30 Minutes Intravenous Every 24 hours 02/21/2016 2103     02/29/16 1800  vancomycin (VANCOCIN) IVPB 1000 mg/200 mL premix     1,000 mg 200 mL/hr over 60 Minutes Intravenous Every 24 hours 03/06/2016 2103     03/11/2016 1800  ceFEPIme (MAXIPIME) 2 g in dextrose 5 % 50 mL IVPB     2 g 100 mL/hr over 30 Minutes Intravenous  Once 03/09/2016 1746 02/23/2016 2044   03/11/2016 1800  vancomycin (VANCOCIN)  IVPB 1000 mg/200 mL premix     1,000 mg 200 mL/hr over 60 Minutes Intravenous  Once 03/13/2016 1746 03/08/2016 1948      Subjective: Without complaints currently  Objective: Vitals:   03/01/16 0400 03/01/16 0755 03/01/16 0804 03/01/16 1200  BP:      Pulse:  (!) 102    Resp:  (!) 23    Temp: 98.1 F (36.7 C) 97.1 F (36.2 C)  (!) 96.9 F (36.1  C)  TempSrc: Axillary Oral  Axillary  SpO2:  100% 100%   Weight: 70 kg (154 lb 5.2 oz)     Height:        Intake/Output Summary (Last 24 hours) at 03/01/16 1528 Last data filed at 03/01/16 1201  Gross per 24 hour  Intake              243 ml  Output             1400 ml  Net            -1157 ml   Filed Weights   03/15/2016 2127 02/29/16 0500 03/01/16 0400  Weight: 68.2 kg (150 lb 5.7 oz) 68.2 kg (150 lb 5.7 oz) 70 kg (154 lb 5.2 oz)    Examination:  General exam: Alert, oriented, conversant Respiratory system: Normal chest rise, speaking full sentences, no audible wheezing Cardiovascular system: Regular rhythm, S1-S2 Gastrointestinal system: Positive bowel sounds, no masses Central nervous system: no tremors, sensation intact Extremities: No cyanosis, no joint deformities Skin: No rashes, no notable skin lesions seen Psychiatry: Affect normal, no auditory hallucinations  Data Reviewed: I have personally reviewed following labs and imaging studies  CBC:  Recent Labs Lab 03/08/2016 1705 02/29/16 0545 03/01/16 0452  WBC 17.0* 13.1* 9.4  NEUTROABS 12.2*  --   --   HGB 10.7* 8.7* 9.0*  HCT 32.9* 26.6* 28.0*  MCV 100.0 98.9 102.2*  PLT 428* 323 123456   Basic Metabolic Panel:  Recent Labs Lab 02/26/2016 1705 02/29/16 0545 03/01/16 0452  NA 126* 126* 126*  K 4.5 4.3 4.1  CL 92* 97* 99*  CO2 17* 20* 19*  GLUCOSE 138* 139* 127*  BUN 30* 31* 27*  CREATININE 1.82* 1.59* 1.23  CALCIUM 9.3 8.2* 8.4*   GFR: Estimated Creatinine Clearance: 49 mL/min (by C-G formula based on SCr of 1.23 mg/dL). Liver Function Tests:  Recent Labs Lab 03/14/2016 1705 02/29/16 0545 03/01/16 0452  AST 32 33 48*  ALT 33 45 72*  ALKPHOS 95 81 86  BILITOT 1.6* 1.3* 1.2  PROT 7.2 6.0* 6.0*  ALBUMIN 3.7 3.1* 3.1*   No results for input(s): LIPASE, AMYLASE in the last 168 hours. No results for input(s): AMMONIA in the last 168 hours. Coagulation Profile: No results for input(s): INR,  PROTIME in the last 168 hours. Cardiac Enzymes: No results for input(s): CKTOTAL, CKMB, CKMBINDEX, TROPONINI in the last 168 hours. BNP (last 3 results) No results for input(s): PROBNP in the last 8760 hours. HbA1C: No results for input(s): HGBA1C in the last 72 hours. CBG: No results for input(s): GLUCAP in the last 168 hours. Lipid Profile: No results for input(s): CHOL, HDL, LDLCALC, TRIG, CHOLHDL, LDLDIRECT in the last 72 hours. Thyroid Function Tests: No results for input(s): TSH, T4TOTAL, FREET4, T3FREE, THYROIDAB in the last 72 hours. Anemia Panel: No results for input(s): VITAMINB12, FOLATE, FERRITIN, TIBC, IRON, RETICCTPCT in the last 72 hours. Sepsis Labs:  Recent Labs Lab 03/12/2016 1641 03/07/2016 2307 02/29/16 0944 02/29/16 1247  LATICACIDVEN 7.37* 3.1* 1.9 2.0*    Recent Results (from the past 240 hour(s))  Culture, blood (Routine x 2)     Status: None (Preliminary result)   Collection Time: 02/27/2016  4:51 PM  Result Value Ref Range Status   Specimen Description BLOOD RIGHT ARM  Final   Special Requests   Final    BOTTLES DRAWN AEROBIC AND ANAEROBIC AEB=12CC ANA=8CC   Culture NO GROWTH 2 DAYS  Final   Report Status PENDING  Incomplete  Culture, blood (Routine x 2)     Status: None (Preliminary result)   Collection Time: 03/17/2016  5:05 PM  Result Value Ref Range Status   Specimen Description BLOOD LEFT HAND  Final   Special Requests   Final    BOTTLES DRAWN AEROBIC AND ANAEROBIC AEB=5CC ANA=4CC   Culture NO GROWTH 2 DAYS  Final   Report Status PENDING  Incomplete  Urine culture     Status: None   Collection Time: 03/07/2016  9:00 PM  Result Value Ref Range Status   Specimen Description URINE, CLEAN CATCH  Final   Special Requests NONE  Final   Culture NO GROWTH Performed at [facility]   Final   Report Status 03/01/2016 FINAL  Final     Radiology Studies: Dg Chest 2 View  Result Date: 03/03/2016 CLINICAL DATA:  Shortness of breath for 1 week  EXAM: CHEST  2 VIEW COMPARISON:  02/15/2016 FINDINGS: Cardiac shadow is enlarged. Aortic calcifications are again seen. Findings of prior granulomatous disease are noted. Small left pleural effusion is again identified. The right-sided effusion has resolved in the interval. Patchy changes are noted in the left lung base consistent with early infiltrate. These have increased in the interval from the prior exam. IMPRESSION: Left lower lobe infiltrate with associated effusion. Electronically Signed   By: Inez Catalina M.D.   On: 02/22/2016 18:12   Dg Chest Port 1 View  Result Date: 03/01/2016 CLINICAL DATA:  Pneumonia.  Cardiomyopathy. EXAM: PORTABLE CHEST 1 VIEW COMPARISON:  February 28, 2016 FINDINGS: There is interstitial pulmonary edema. There is patchy atelectasis in the lung bases. There is focal airspace consolidation in the left upper lobe slightly lateral to the hilum on the left. There is generalized cardiomegaly with pulmonary venous hypertension. There is atherosclerotic calcification in the aorta. There is a calcified granuloma in the right mid lung, stable. No adenopathy evident. IMPRESSION: Evidence a degree of congestive heart failure. Aortic atherosclerosis. Calcified granuloma right upper lobe. Airspace opacity in the left upper lobe may represent alveolar edema but also could represent focal pneumonia. This finding is new compared to recent study. Electronically Signed   By: Lowella Grip III M.D.   On: 03/01/2016 08:04    Scheduled Meds: . amiodarone  100 mg Oral Daily  . ceFEPime (MAXIPIME) IV  1 g Intravenous Q24H  . docusate sodium  100 mg Oral BID  . fluticasone furoate-vilanterol  1 puff Inhalation Daily  . folic acid  1 mg Oral Daily  . heparin  5,000 Units Subcutaneous Q8H  . hydrocortisone cream   Topical BID  . mirtazapine  15 mg Oral QHS  . multivitamin with minerals  1 tablet Oral Daily  . pantoprazole  40 mg Oral Daily  . potassium chloride  10 mEq Oral BID  .  QUEtiapine  50 mg Oral QHS  . senna-docusate  1 tablet Oral QHS  . sertraline  75 mg Oral Daily  . sodium chloride flush  3 mL  Intravenous Q12H  . thiamine  100 mg Oral Daily  . umeclidinium bromide  1 puff Inhalation Daily  . vancomycin  1,000 mg Intravenous Q24H   Continuous Infusions:     LOS: 2 days   Egan Berkheimer, Orpah Melter, MD [facility] Pager [phone]  If 7PM-7AM, please contact night-coverage [email or web address] Password [email or web address] 03/01/2016, 3:28 PM

## 2016-03-02 LAB — BASIC METABOLIC PANEL
ANION GAP: 7 (ref 5–15)
BUN: 23 mg/dL — ABNORMAL HIGH (ref 6–20)
CHLORIDE: 99 mmol/L — AB (ref 101–111)
CO2: 19 mmol/L — AB (ref 22–32)
Calcium: 8.8 mg/dL — ABNORMAL LOW (ref 8.9–10.3)
Creatinine, Ser: 1.05 mg/dL (ref 0.61–1.24)
GFR calc non Af Amer: >60 mL/min (ref >60)
Glucose, Bld: 105 mg/dL — ABNORMAL HIGH (ref 65–99)
Potassium: 4.9 mmol/L (ref 3.5–5.1)
Sodium: 125 mmol/L — ABNORMAL LOW (ref 135–145)

## 2016-03-02 LAB — CBC
HEMATOCRIT: 28.3 % — AB (ref 39.0–52.0)
HEMOGLOBIN: 9.1 g/dL — AB (ref 13.0–17.0)
MCH: 32.5 pg (ref 26.0–34.0)
MCHC: 32.2 g/dL (ref 30.0–36.0)
MCV: 101.1 fL — AB (ref 78.0–100.0)
Platelets: 335 10*3/uL (ref 150–400)
RBC: 2.8 MIL/uL — AB (ref 4.22–5.81)
RDW: 17.8 % — ABNORMAL HIGH (ref 11.5–15.5)
WBC: 10.3 10*3/uL (ref 4.0–10.5)

## 2016-03-02 MED ORDER — FUROSEMIDE 10 MG/ML IJ SOLN
40.0000 mg | Freq: Once | INTRAMUSCULAR | Status: AC
  Administered 2016-03-02: 40 mg via INTRAVENOUS
  Filled 2016-03-02: qty 4

## 2016-03-02 MED ORDER — VANCOMYCIN HCL IN DEXTROSE 750-5 MG/150ML-% IV SOLN
750.0000 mg | Freq: Two times a day (BID) | INTRAVENOUS | Status: DC
  Administered 2016-03-02 – 2016-03-04 (×4): 750 mg via INTRAVENOUS
  Filled 2016-03-02 (×5): qty 150

## 2016-03-02 MED ORDER — ZOLPIDEM TARTRATE 5 MG PO TABS
5.0000 mg | ORAL_TABLET | Freq: Every evening | ORAL | Status: DC | PRN
  Administered 2016-03-03: 5 mg via ORAL
  Filled 2016-03-02: qty 1

## 2016-03-02 MED ORDER — DEXTROSE 5 % IV SOLN
1.0000 g | Freq: Two times a day (BID) | INTRAVENOUS | Status: DC
  Administered 2016-03-02 – 2016-03-03 (×4): 1 g via INTRAVENOUS
  Filled 2016-03-02 (×7): qty 1

## 2016-03-02 NOTE — Progress Notes (Signed)
Pharmacy Antibiotic Note  Chad Rogers is a 80 y.o. male admitted on 03/08/2016 with pneumonia.  Pharmacy has been consulted for vancomycin and cefepime dosing. Renal function has improved  Plan: Change vancomycin to 750 mg IV q12 hours Change Cefepime 1gm IV q12h F/U cultures Monitor V/S and clinical progress  Height: 5\' 10"  (177.8 cm) Weight: 163 lb 5.8 oz (74.1 kg) IBW/kg (Calculated) : 73  Temp (24hrs), Avg:96.8 F (36 C), Min:95.7 F (35.4 C), Max:97.4 F (36.3 C)   Recent Labs Lab 02/25/2016 1641 03/08/2016 1705 03/14/2016 2307 02/29/16 0545 02/29/16 0944 02/29/16 1247 03/01/16 0452 03/02/16 0446  WBC  --  17.0*  --  13.1*  --   --  9.4 10.3  CREATININE  --  1.82*  --  1.59*  --   --  1.23 1.05  LATICACIDVEN 7.37*  --  3.1*  --  1.9 2.0*  --   --     Estimated Creatinine Clearance: 59.9 mL/min (by C-G formula based on SCr of 1.05 mg/dL).    No Known Allergies  Antimicrobials this admission: Vancomycin 10/11 >>  Cefepime 10/11 >>   Dose adjustments this admission: 10/14-  Doses adjusted for improved renal function  Microbiology results: 10/11 BCx: pending 10/11 UCx: ng final  Thank you for allowing pharmacy to be a part of this patient's care.  Bartholomew Crews D Clinical Pharmacist   03/02/2016 11:45 AM

## 2016-03-02 NOTE — Progress Notes (Signed)
PROGRESS NOTE    Chad Rogers  [id number] DOB: [birth year] DOA: 02/19/2016 PCP: [name], MD    Brief Narrative:   80 y.o. male with multiple medical problems including chronic afib not on anticoagulation, hx of pulmonary fibrosis, systolic CHF 123456, HTN, HLD, debility, tobacco abuse, admitted after a fall last month, Fx several ribs, intubated, trached, and subsequently improved, went to [place], rehab, patient was then discharged home one week patient presented to the emergency department with complaints of worsening shortness of breath. Patient was found to be hypotensive, hypoxic with an elevated lactic acid of 7 area patient was found to have evidence of pneumonia on chest x-ray with severe sepsis. She was admitted for further workup  Assessment & Plan:   Principal Problem:   Sepsis ([place]) Active Problems:   Hypertension   Pulmonary fibrosis ([place])   Tobacco abuse   Traumatic fracture of ribs with pneumothorax   Multiple rib fractures   Chronic systolic CHF (congestive heart failure) (HCC)   Atrial fibrillation, persistent ([place])   Debility   HCAP (healthcare-associated pneumonia)   1. Healthcare associated pneumonia with severe sepsis with septic shock present on admission 1. Patient presented with pneumonia, hypotension with elevated lactic acid and leukocytosis 2. Lactate has improved with IV fluids 3. Have been careful giving hydration secondary to known EF of 25% 4. For now, patient is continued on empiric vancomycin and cefepime 5. Afebrile and no leukocytosis currently, labs reviewed 2. Hypertension 1. Blood pressure early soft but stable. 2. Improved since admission, not requiring pressors 3. We'll continue to hold IV fluids for the time being due to concerns of acute heart failure 3. Pulmonary fibrosis 1. Patient followed by Dr. Melvyn Novas prior to admission 2. Continue to wean O2 as tolerated 4. COPD 1. No active wheezing auscultated on exam today 2. Patient is  continued on when necessary Xopenex 5. Cardiomyopathy with acutely decompensated Chronic systolic CHF with EF of 123456 1. Patient did require IV fluid boluses on admission secondary to severe sepsis and septic shock 2. IV fluids now on hold 3. Patient with increased respiratory effort this morning, recent chest x-ray demonstrated evidence of systolic heart failure 4. We'll order IV Lasix as blood pressure has improved this morning 5. Repeat comprehensive metabolic panel the morning 6. Will not order repeat 2-D echocardiogram as one was done recently on 01/01/2016 6. Prior tobacco abuse 1. Stable at present 7. CAD 1. Continues without chest pain. 2. Continue to monitor 8. Lactic acidosis 1. Much improved following IV fluids  DVT prophylaxis: Heparin subcutaneous Code Status: Full code Family Communication: Patient in room, family in room Disposition Plan: Uncertain at this time  Consultants:     Procedures:     Antimicrobials: Anti-infectives    Start     Dose/Rate Route Frequency Ordered Stop   03/02/16 1200  ceFEPIme (MAXIPIME) 1 g in dextrose 5 % 50 mL IVPB     1 g 100 mL/hr over 30 Minutes Intravenous Every 12 hours 03/02/16 1145     03/02/16 1200  vancomycin (VANCOCIN) IVPB 750 mg/150 ml premix     750 mg 150 mL/hr over 60 Minutes Intravenous Every 12 hours 03/02/16 1150     02/29/16 1800  ceFEPIme (MAXIPIME) 1 g in dextrose 5 % 50 mL IVPB  Status:  Discontinued     1 g 100 mL/hr over 30 Minutes Intravenous Every 24 hours 03/03/2016 2103 03/02/16 1145   02/29/16 1800  vancomycin (VANCOCIN) IVPB 1000 mg/200 mL premix  Status:  Discontinued     1,000 mg 200 mL/hr over 60 Minutes Intravenous Every 24 hours 03/15/2016 2103 03/02/16 1150   03/13/2016 1800  ceFEPIme (MAXIPIME) 2 g in dextrose 5 % 50 mL IVPB     2 g 100 mL/hr over 30 Minutes Intravenous  Once 03/04/2016 1746 02/23/2016 2044   03/08/2016 1800  vancomycin (VANCOCIN) IVPB 1000 mg/200 mL premix     1,000 mg 200 mL/hr over  60 Minutes Intravenous  Once 02/27/2016 1746 03/05/2016 1948      Subjective: Reports mildly increased shortness of breath   Objective: Vitals:   03/02/16 1300 03/02/16 1402 03/02/16 1500 03/02/16 1600  BP: 102/65 110/68 102/75 100/73  Pulse: 89 90 85 89  Resp: 15 14 17 15   Temp:      TempSrc:      SpO2: 100% 100% 100% 98%  Weight:      Height:        Intake/Output Summary (Last 24 hours) at 03/02/16 1618 Last data filed at 03/02/16 1542  Gross per 24 hour  Intake              725 ml  Output              600 ml  Net              125 ml   Filed Weights   02/29/16 0500 03/01/16 0400 03/02/16 0400  Weight: 68.2 kg (150 lb 5.7 oz) 70 kg (154 lb 5.2 oz) 74.1 kg (163 lb 5.8 oz)    Examination:  General exam: Lying in bed, in no acute distress Respiratory system: Mildly increased respiratory effort, no audible wheezing Cardiovascular system: Regular rate, S1-S2 Gastrointestinal system: Soft, nondistended, nontender Central nervous system: CN II through XII grossly intact, strength intact Extremities: No clubbing, perfused Skin: Normal skin turgor, no pallor Psychiatry: Mood normal, no visual hallucinations  Data Reviewed: I have personally reviewed following labs and imaging studies  CBC:  Recent Labs Lab 02/19/2016 1705 02/29/16 0545 03/01/16 0452 03/02/16 0446  WBC 17.0* 13.1* 9.4 10.3  NEUTROABS 12.2*  --   --   --   HGB 10.7* 8.7* 9.0* 9.1*  HCT 32.9* 26.6* 28.0* 28.3*  MCV 100.0 98.9 102.2* 101.1*  PLT 428* 323 315 123456   Basic Metabolic Panel:  Recent Labs Lab 03/09/2016 1705 02/29/16 0545 03/01/16 0452 03/02/16 0446  NA 126* 126* 126* 125*  K 4.5 4.3 4.1 4.9  CL 92* 97* 99* 99*  CO2 17* 20* 19* 19*  GLUCOSE 138* 139* 127* 105*  BUN 30* 31* 27* 23*  CREATININE 1.82* 1.59* 1.23 1.05  CALCIUM 9.3 8.2* 8.4* 8.8*   GFR: Estimated Creatinine Clearance: 59.9 mL/min (by C-G formula based on SCr of 1.05 mg/dL). Liver Function Tests:  Recent Labs Lab  03/08/2016 1705 02/29/16 0545 03/01/16 0452  AST 32 33 48*  ALT 33 45 72*  ALKPHOS 95 81 86  BILITOT 1.6* 1.3* 1.2  PROT 7.2 6.0* 6.0*  ALBUMIN 3.7 3.1* 3.1*   No results for input(s): LIPASE, AMYLASE in the last 168 hours. No results for input(s): AMMONIA in the last 168 hours. Coagulation Profile: No results for input(s): INR, PROTIME in the last 168 hours. Cardiac Enzymes: No results for input(s): CKTOTAL, CKMB, CKMBINDEX, TROPONINI in the last 168 hours. BNP (last 3 results) No results for input(s): PROBNP in the last 8760 hours. HbA1C: No results for input(s): HGBA1C in the last 72 hours. CBG: No results for  input(s): GLUCAP in the last 168 hours. Lipid Profile: No results for input(s): CHOL, HDL, LDLCALC, TRIG, CHOLHDL, LDLDIRECT in the last 72 hours. Thyroid Function Tests: No results for input(s): TSH, T4TOTAL, FREET4, T3FREE, THYROIDAB in the last 72 hours. Anemia Panel: No results for input(s): VITAMINB12, FOLATE, FERRITIN, TIBC, IRON, RETICCTPCT in the last 72 hours. Sepsis Labs:  Recent Labs Lab 03/19/2016 1641 02/29/2016 2307 02/29/16 0944 02/29/16 1247  LATICACIDVEN 7.37* 3.1* 1.9 2.0*    Recent Results (from the past 240 hour(s))  Culture, blood (Routine x 2)     Status: None (Preliminary result)   Collection Time: 03/03/2016  4:51 PM  Result Value Ref Range Status   Specimen Description BLOOD RIGHT ARM  Final   Special Requests   Final    BOTTLES DRAWN AEROBIC AND ANAEROBIC AEB=12CC ANA=8CC   Culture NO GROWTH 3 DAYS  Final   Report Status PENDING  Incomplete  Culture, blood (Routine x 2)     Status: None (Preliminary result)   Collection Time: 03/18/2016  5:05 PM  Result Value Ref Range Status   Specimen Description BLOOD LEFT HAND  Final   Special Requests   Final    BOTTLES DRAWN AEROBIC AND ANAEROBIC AEB=5CC ANA=4CC   Culture NO GROWTH 3 DAYS  Final   Report Status PENDING  Incomplete  Urine culture     Status: None   Collection Time: 03/06/2016  9:00  PM  Result Value Ref Range Status   Specimen Description URINE, CLEAN CATCH  Final   Special Requests NONE  Final   Culture NO GROWTH Performed at [facility]   Final   Report Status 03/01/2016 FINAL  Final     Radiology Studies: Dg Chest Port 1 View  Result Date: 03/01/2016 CLINICAL DATA:  Pneumonia.  Cardiomyopathy. EXAM: PORTABLE CHEST 1 VIEW COMPARISON:  February 28, 2016 FINDINGS: There is interstitial pulmonary edema. There is patchy atelectasis in the lung bases. There is focal airspace consolidation in the left upper lobe slightly lateral to the hilum on the left. There is generalized cardiomegaly with pulmonary venous hypertension. There is atherosclerotic calcification in the aorta. There is a calcified granuloma in the right mid lung, stable. No adenopathy evident. IMPRESSION: Evidence a degree of congestive heart failure. Aortic atherosclerosis. Calcified granuloma right upper lobe. Airspace opacity in the left upper lobe may represent alveolar edema but also could represent focal pneumonia. This finding is new compared to recent study. Electronically Signed   By: Lowella Grip III M.D.   On: 03/01/2016 08:04    Scheduled Meds: . amiodarone  100 mg Oral Daily  . ceFEPime (MAXIPIME) IV  1 g Intravenous Q12H  . docusate sodium  100 mg Oral BID  . fluticasone furoate-vilanterol  1 puff Inhalation Daily  . folic acid  1 mg Oral Daily  . heparin  5,000 Units Subcutaneous Q8H  . hydrocortisone cream   Topical BID  . mirtazapine  15 mg Oral QHS  . multivitamin with minerals  1 tablet Oral Daily  . pantoprazole  40 mg Oral Daily  . potassium chloride  10 mEq Oral BID  . QUEtiapine  50 mg Oral QHS  . senna-docusate  1 tablet Oral QHS  . sertraline  75 mg Oral Daily  . sodium chloride flush  3 mL Intravenous Q12H  . thiamine  100 mg Oral Daily  . umeclidinium bromide  1 puff Inhalation Daily  . vancomycin  750 mg Intravenous Q12H   Continuous Infusions:  LOS: 3  days   Almeter Westhoff, Orpah Melter, MD [facility] Pager [phone]  If 7PM-7AM, please contact night-coverage [email or web address] Password [email or web address] 03/02/2016, 4:18 PM

## 2016-03-02 NOTE — Plan of Care (Signed)
Problem: Safety: Goal: Ability to remain free from injury will improve Outcome: Progressing Patient's call light is in reach. Socks are on. Bed in lowest position. Belongings within reach.  Problem: Pain Managment: Goal: General experience of comfort will improve Outcome: Progressing Patient pain has been controlled tonight with pain meds and repositioning. Rest also helps with his pain  Problem: Physical Regulation: Goal: Will remain free from infection Outcome: Progressing Patient is currently free from infection. Will continue universal precautions.  Problem: Skin Integrity: Goal: Risk for impaired skin integrity will decrease Outcome: Progressing Patient refused cream being put on his skin. Patient can turn himself. Abrasions from previous falls are covered.

## 2016-03-03 MED ORDER — FUROSEMIDE 10 MG/ML IJ SOLN
40.0000 mg | Freq: Once | INTRAMUSCULAR | Status: AC
  Administered 2016-03-03: 40 mg via INTRAVENOUS
  Filled 2016-03-03: qty 4

## 2016-03-03 MED ORDER — DEXTROSE 5 % IV SOLN
INTRAVENOUS | Status: AC
  Filled 2016-03-03: qty 1

## 2016-03-03 NOTE — Progress Notes (Addendum)
PROGRESS NOTE    Chad Rogers  [id number] DOB: 09-18-1935 DOA: 03/14/2016 PCP: [name], MD    Brief Narrative:   80 y.o. male with multiple medical problems including chronic afib not on anticoagulation, hx of pulmonary fibrosis, systolic CHF 123456, HTN, HLD, debility, tobacco abuse, admitted after a fall last month, Fx several ribs, intubated, trached, and subsequently improved, went to [place], rehab, patient was then discharged home one week patient presented to the emergency department with complaints of worsening shortness of breath. Patient was found to be hypotensive, hypoxic with an elevated lactic acid of 7 area patient was found to have evidence of pneumonia on chest x-ray with severe sepsis. She was admitted for further workup  Assessment & Plan:   Principal Problem:   Sepsis ([place]) Active Problems:   Hypertension   Pulmonary fibrosis ([place])   Tobacco abuse   Traumatic fracture of ribs with pneumothorax   Multiple rib fractures   Chronic systolic CHF (congestive heart failure) (HCC)   Atrial fibrillation, persistent ([place])   Debility   HCAP (healthcare-associated pneumonia)   1. Healthcare associated pneumonia with severe sepsis with septic shock present on admission 1. Patient noted to have hypotension with elevated lactic acid, leukocytosis with x-ray evidence of pneumonia at presentation 2. Patient is continued on vancomycin and cefepime empirically 3. Lactic acid has since normalized with IV fluids 4. Patient currently afebrile with no leukocytosis 2. Hypertension 1. Overall, blood pressures have improved since hospital admission 2. She remains off of blood pressure medications 3. Thus far, tolerating Lasix per below 3. Pulmonary fibrosis 1. Patient is normally followed by Dr. Melvyn Novas as an outpatient 2. Patient followed by Dr. Melvyn Novas prior to admission 3. Patient on 3 L nasal cannula this morning. Continue to wean as tolerated 4. COPD 1. Patient without audible  wheezing on exam 2. For now, continue as needed Xopenex 5. Cardiomyopathy with acutely decompensated Chronic systolic CHF with EF of 123456 1. Patient did require aggressive IV fluids at time of admission secondary to severe sepsis with septic shock 2. IV fluids on hold 3. Recent chest x-ray demonstrated pulmonary edema 4. Given improved blood pressure, have started IV Lasix 5. Insignificant urine output documented overnight. Foley catheter now in place (see below) 6. Will not order repeat 2-D echocardiogram as one was done recently on 01/01/2016 7. Will repeat CXR in AM for interval change 6. Prior tobacco abuse 1. Seems stable at this time 7. CAD 1. Patient is without chest pain or shortness of breath 2. Continue to monitor 8. Lactic acidosis 1. Much improved with IV fluids 9. Bladder outlet obstruction 1. This is a new problem 2. Nearly 500 mL noted on bladder ultrasound this morning 3. Have ordered indwelling Foley catheter 4. Continue Foley catheter, monitor strict I's and O's 5. Repeat comprehensive metabolic panel morning  DVT prophylaxis: Heparin subcutaneous Code Status: Full code Family Communication: Patient in room, family in room Disposition Plan: Uncertain at this time  Consultants:     Procedures:     Antimicrobials: Anti-infectives    Start     Dose/Rate Route Frequency Ordered Stop   03/02/16 1200  ceFEPIme (MAXIPIME) 1 g in dextrose 5 % 50 mL IVPB     1 g 100 mL/hr over 30 Minutes Intravenous Every 12 hours 03/02/16 1145     03/02/16 1200  vancomycin (VANCOCIN) IVPB 750 mg/150 ml premix     750 mg 150 mL/hr over 60 Minutes Intravenous Every 12 hours 03/02/16 1150  02/29/16 1800  ceFEPIme (MAXIPIME) 1 g in dextrose 5 % 50 mL IVPB  Status:  Discontinued     1 g 100 mL/hr over 30 Minutes Intravenous Every 24 hours 02/18/2016 2103 03/02/16 1145   02/29/16 1800  vancomycin (VANCOCIN) IVPB 1000 mg/200 mL premix  Status:  Discontinued     1,000 mg 200 mL/hr  over 60 Minutes Intravenous Every 24 hours 03/09/2016 2103 03/02/16 1150   03/13/2016 1800  ceFEPIme (MAXIPIME) 2 g in dextrose 5 % 50 mL IVPB     2 g 100 mL/hr over 30 Minutes Intravenous  Once 02/22/2016 1746 03/08/2016 2044   03/18/2016 1800  vancomycin (VANCOCIN) IVPB 1000 mg/200 mL premix     1,000 mg 200 mL/hr over 60 Minutes Intravenous  Once 03/15/2016 1746 02/21/2016 1948      Subjective: Patient denies abdominal pain or distention  Objective: Vitals:   03/03/16 1044 03/03/16 1136 03/03/16 1300 03/03/16 1400  BP:   97/66 115/73  Pulse:  83 79 78  Resp:  18 14 13   Temp:  (!) 96.9 F (36.1 C)    TempSrc:  Oral    SpO2: 99% 97% 96% 97%  Weight:      Height:        Intake/Output Summary (Last 24 hours) at 03/03/16 1512 Last data filed at 03/03/16 1211  Gross per 24 hour  Intake              880 ml  Output              150 ml  Net              730 ml   Filed Weights   02/29/16 0500 03/01/16 0400 03/02/16 0400  Weight: 68.2 kg (150 lb 5.7 oz) 70 kg (154 lb 5.2 oz) 74.1 kg (163 lb 5.8 oz)    Examination:  General exam: Awake, conversant, in no acute distress Respiratory system: Appears to have slightly increased respiratory effort, no audible wheezing Cardiovascular system: Regular rhythm, S1-S2 Gastrointestinal system: No palpable masses, positive bowel sounds Central nervous system: No seizures, no tremors noted Extremities: No cyanosis, perfused Skin: No rashes, no notable skin lesions seen Psychiatry: Affect normal, no auditory hallucinations  Data Reviewed: I have personally reviewed following labs and imaging studies  CBC:  Recent Labs Lab 02/18/2016 1705 02/29/16 0545 03/01/16 0452 03/02/16 0446  WBC 17.0* 13.1* 9.4 10.3  NEUTROABS 12.2*  --   --   --   HGB 10.7* 8.7* 9.0* 9.1*  HCT 32.9* 26.6* 28.0* 28.3*  MCV 100.0 98.9 102.2* 101.1*  PLT 428* 323 315 123456   Basic Metabolic Panel:  Recent Labs Lab 03/19/2016 1705 02/29/16 0545 03/01/16 0452  03/02/16 0446  NA 126* 126* 126* 125*  K 4.5 4.3 4.1 4.9  CL 92* 97* 99* 99*  CO2 17* 20* 19* 19*  GLUCOSE 138* 139* 127* 105*  BUN 30* 31* 27* 23*  CREATININE 1.82* 1.59* 1.23 1.05  CALCIUM 9.3 8.2* 8.4* 8.8*   GFR: Estimated Creatinine Clearance: 59.9 mL/min (by C-G formula based on SCr of 1.05 mg/dL). Liver Function Tests:  Recent Labs Lab 02/27/2016 1705 02/29/16 0545 03/01/16 0452  AST 32 33 48*  ALT 33 45 72*  ALKPHOS 95 81 86  BILITOT 1.6* 1.3* 1.2  PROT 7.2 6.0* 6.0*  ALBUMIN 3.7 3.1* 3.1*   No results for input(s): LIPASE, AMYLASE in the last 168 hours. No results for input(s): AMMONIA in the last 168 hours.  Coagulation Profile: No results for input(s): INR, PROTIME in the last 168 hours. Cardiac Enzymes: No results for input(s): CKTOTAL, CKMB, CKMBINDEX, TROPONINI in the last 168 hours. BNP (last 3 results) No results for input(s): PROBNP in the last 8760 hours. HbA1C: No results for input(s): HGBA1C in the last 72 hours. CBG: No results for input(s): GLUCAP in the last 168 hours. Lipid Profile: No results for input(s): CHOL, HDL, LDLCALC, TRIG, CHOLHDL, LDLDIRECT in the last 72 hours. Thyroid Function Tests: No results for input(s): TSH, T4TOTAL, FREET4, T3FREE, THYROIDAB in the last 72 hours. Anemia Panel: No results for input(s): VITAMINB12, FOLATE, FERRITIN, TIBC, IRON, RETICCTPCT in the last 72 hours. Sepsis Labs:  Recent Labs Lab 03/11/2016 1641 03/14/2016 2307 02/29/16 0944 02/29/16 1247  LATICACIDVEN 7.37* 3.1* 1.9 2.0*    Recent Results (from the past 240 hour(s))  Culture, blood (Routine x 2)     Status: None (Preliminary result)   Collection Time: 03/04/2016  4:51 PM  Result Value Ref Range Status   Specimen Description BLOOD RIGHT ARM  Final   Special Requests   Final    BOTTLES DRAWN AEROBIC AND ANAEROBIC AEB=12CC ANA=8CC   Culture NO GROWTH 4 DAYS  Final   Report Status PENDING  Incomplete  Culture, blood (Routine x 2)     Status: None  (Preliminary result)   Collection Time: 03/04/2016  5:05 PM  Result Value Ref Range Status   Specimen Description BLOOD LEFT HAND  Final   Special Requests   Final    BOTTLES DRAWN AEROBIC AND ANAEROBIC AEB=5CC ANA=4CC   Culture NO GROWTH 4 DAYS  Final   Report Status PENDING  Incomplete  Urine culture     Status: None   Collection Time: 02/21/2016  9:00 PM  Result Value Ref Range Status   Specimen Description URINE, CLEAN CATCH  Final   Special Requests NONE  Final   Culture NO GROWTH Performed at [facility]   Final   Report Status 03/01/2016 FINAL  Final     Radiology Studies: No results found.  Scheduled Meds: . amiodarone  100 mg Oral Daily  . ceFEPime (MAXIPIME) IV  1 g Intravenous Q12H  . docusate sodium  100 mg Oral BID  . fluticasone furoate-vilanterol  1 puff Inhalation Daily  . folic acid  1 mg Oral Daily  . heparin  5,000 Units Subcutaneous Q8H  . hydrocortisone cream   Topical BID  . mirtazapine  15 mg Oral QHS  . multivitamin with minerals  1 tablet Oral Daily  . pantoprazole  40 mg Oral Daily  . potassium chloride  10 mEq Oral BID  . QUEtiapine  50 mg Oral QHS  . senna-docusate  1 tablet Oral QHS  . sertraline  75 mg Oral Daily  . sodium chloride flush  3 mL Intravenous Q12H  . thiamine  100 mg Oral Daily  . umeclidinium bromide  1 puff Inhalation Daily  . vancomycin  750 mg Intravenous Q12H   Continuous Infusions:     LOS: 4 days   Karder Goodin, Orpah Melter, MD [facility] Pager [phone]  If 7PM-7AM, please contact night-coverage [email or web address] Password [email or web address] 03/03/2016, 3:12 PM

## 2016-03-03 NOTE — Progress Notes (Signed)
Patient was bladder scanned this am and it revealed 495cc of urine in bladder while MD was in room assessing the patient. Patient denies having the urge to void at this time. MD ordered foley catheter to be inserted.

## 2016-03-04 ENCOUNTER — Encounter (HOSPITAL_COMMUNITY): Payer: Self-pay | Admitting: Pulmonary Disease

## 2016-03-04 ENCOUNTER — Inpatient Hospital Stay (HOSPITAL_COMMUNITY): Payer: Medicare Other

## 2016-03-04 ENCOUNTER — Inpatient Hospital Stay (HOSPITAL_COMMUNITY): Payer: Medicare Other | Admitting: Anesthesiology

## 2016-03-04 DIAGNOSIS — J8410 Pulmonary fibrosis, unspecified: Secondary | ICD-10-CM

## 2016-03-04 DIAGNOSIS — J189 Pneumonia, unspecified organism: Secondary | ICD-10-CM

## 2016-03-04 DIAGNOSIS — R001 Bradycardia, unspecified: Secondary | ICD-10-CM

## 2016-03-04 DIAGNOSIS — N179 Acute kidney failure, unspecified: Secondary | ICD-10-CM

## 2016-03-04 DIAGNOSIS — I9589 Other hypotension: Secondary | ICD-10-CM

## 2016-03-04 DIAGNOSIS — I455 Other specified heart block: Secondary | ICD-10-CM

## 2016-03-04 DIAGNOSIS — I509 Heart failure, unspecified: Secondary | ICD-10-CM

## 2016-03-04 DIAGNOSIS — E875 Hyperkalemia: Secondary | ICD-10-CM

## 2016-03-04 DIAGNOSIS — E872 Acidosis, unspecified: Secondary | ICD-10-CM

## 2016-03-04 DIAGNOSIS — I519 Heart disease, unspecified: Secondary | ICD-10-CM

## 2016-03-04 DIAGNOSIS — L8990 Pressure ulcer of unspecified site, unspecified stage: Secondary | ICD-10-CM | POA: Insufficient documentation

## 2016-03-04 DIAGNOSIS — I5020 Unspecified systolic (congestive) heart failure: Secondary | ICD-10-CM

## 2016-03-04 DIAGNOSIS — I5023 Acute on chronic systolic (congestive) heart failure: Secondary | ICD-10-CM

## 2016-03-04 DIAGNOSIS — E43 Unspecified severe protein-calorie malnutrition: Secondary | ICD-10-CM | POA: Insufficient documentation

## 2016-03-04 DIAGNOSIS — I481 Persistent atrial fibrillation: Secondary | ICD-10-CM

## 2016-03-04 DIAGNOSIS — G9340 Encephalopathy, unspecified: Secondary | ICD-10-CM

## 2016-03-04 DIAGNOSIS — A419 Sepsis, unspecified organism: Principal | ICD-10-CM

## 2016-03-04 DIAGNOSIS — Z978 Presence of other specified devices: Secondary | ICD-10-CM

## 2016-03-04 LAB — CBC
HCT: 31.1 % — ABNORMAL LOW (ref 39.0–52.0)
HCT: 25.4 % — ABNORMAL LOW (ref 39.0–52.0)
Hemoglobin: 9.7 g/dL — ABNORMAL LOW (ref 13.0–17.0)
Hemoglobin: 8.3 g/dL — ABNORMAL LOW (ref 13.0–17.0)
MCH: 32.1 pg (ref 26.0–34.0)
MCH: 32 pg (ref 26.0–34.0)
MCHC: 31.2 g/dL (ref 30.0–36.0)
MCHC: 32.7 g/dL (ref 30.0–36.0)
MCV: 103 fL — AB (ref 78.0–100.0)
MCV: 98.1 fL (ref 78.0–100.0)
PLATELETS: 344 10*3/uL (ref 150–400)
PLATELETS: 236 10*3/uL (ref 150–400)
RBC: 3.02 MIL/uL — ABNORMAL LOW (ref 4.22–5.81)
RBC: 2.59 MIL/uL — ABNORMAL LOW (ref 4.22–5.81)
RDW: 18.4 % — AB (ref 11.5–15.5)
RDW: 18.1 % — AB (ref 11.5–15.5)
WBC: 13.5 10*3/uL — AB (ref 4.0–10.5)
WBC: 9.7 10*3/uL (ref 4.0–10.5)

## 2016-03-04 LAB — COMPREHENSIVE METABOLIC PANEL
ALT: 647 U/L — AB (ref 17–63)
AST: 507 U/L — AB (ref 15–41)
Albumin: 2.6 g/dL — ABNORMAL LOW (ref 3.5–5.0)
Alkaline Phosphatase: 119 U/L (ref 38–126)
Anion gap: 9 (ref 5–15)
BILIRUBIN TOTAL: 2.1 mg/dL — AB (ref 0.3–1.2)
BUN: 33 mg/dL — AB (ref 6–20)
CHLORIDE: 91 mmol/L — AB (ref 101–111)
CO2: 25 mmol/L (ref 22–32)
CREATININE: 2.05 mg/dL — AB (ref 0.61–1.24)
Calcium: 8.4 mg/dL — ABNORMAL LOW (ref 8.9–10.3)
GFR calc Af Amer: 34 mL/min — ABNORMAL LOW (ref >60)
GFR, EST NON AFRICAN AMERICAN: 29 mL/min — AB (ref >60)
Glucose, Bld: 325 mg/dL — ABNORMAL HIGH (ref 65–99)
Potassium: 4.4 mmol/L (ref 3.5–5.1)
Sodium: 125 mmol/L — ABNORMAL LOW (ref 135–145)
Total Protein: 5.2 g/dL — ABNORMAL LOW (ref 6.5–8.1)

## 2016-03-04 LAB — CBC WITH DIFFERENTIAL/PLATELET
BASOS ABS: 0 10*3/uL (ref 0.0–0.1)
BASOS PCT: 0 %
EOS ABS: 0 10*3/uL (ref 0.0–0.7)
Eosinophils Relative: 0 %
HEMATOCRIT: 30.8 % — AB (ref 39.0–52.0)
Hemoglobin: 9.7 g/dL — ABNORMAL LOW (ref 13.0–17.0)
Lymphocytes Relative: 16 %
Lymphs Abs: 2.3 10*3/uL (ref 0.7–4.0)
MCH: 32.3 pg (ref 26.0–34.0)
MCHC: 31.5 g/dL (ref 30.0–36.0)
MCV: 102.7 fL — ABNORMAL HIGH (ref 78.0–100.0)
MONO ABS: 1.2 10*3/uL — AB (ref 0.1–1.0)
Monocytes Relative: 8 %
NEUTROS ABS: 11.4 10*3/uL — AB (ref 1.7–7.7)
Neutrophils Relative %: 76 %
PLATELETS: 322 10*3/uL (ref 150–400)
RBC: 3 MIL/uL — ABNORMAL LOW (ref 4.22–5.81)
RDW: 18.5 % — AB (ref 11.5–15.5)
WBC: 14.9 10*3/uL — ABNORMAL HIGH (ref 4.0–10.5)

## 2016-03-04 LAB — BLOOD GAS, ARTERIAL
ACID-BASE DEFICIT: 5.9 mmol/L — AB (ref 0.0–2.0)
Acid-base deficit: 13.7 mmol/L — ABNORMAL HIGH (ref 0.0–2.0)
BICARBONATE: 19.7 mmol/L — AB (ref 20.0–28.0)
Bicarbonate: 13.7 mmol/L — ABNORMAL LOW (ref 20.0–28.0)
DRAWN BY: 277331
DRAWN BY: 277331
FIO2: 0.4
MECHVT: 580 mL
O2 Content: 3 L/min
O2 Saturation: 94.4 %
O2 Saturation: 99.5 %
PCO2 ART: 29 mmHg — AB (ref 32.0–48.0)
PEEP/CPAP: 5 cmH2O
PH ART: 7.245 — AB (ref 7.350–7.450)
PO2 ART: 176 mmHg — AB (ref 83.0–108.0)
Patient temperature: 37
Patient temperature: 37
RATE: 16 resp/min
pCO2 arterial: 33.3 mmHg (ref 32.0–48.0)
pH, Arterial: 7.363 (ref 7.350–7.450)
pO2, Arterial: 90.6 mmHg (ref 83.0–108.0)

## 2016-03-04 LAB — POCT I-STAT 3, ART BLOOD GAS (G3+)
Acid-base deficit: 5 mmol/L — ABNORMAL HIGH (ref 0.0–2.0)
BICARBONATE: 19.9 mmol/L — AB (ref 20.0–28.0)
O2 Saturation: 100 %
PCO2 ART: 35.2 mmHg (ref 32.0–48.0)
PO2 ART: 177 mmHg — AB (ref 83.0–108.0)
Patient temperature: 97.2
TCO2: 21 mmol/L (ref 0–100)
pH, Arterial: 7.356 (ref 7.350–7.450)

## 2016-03-04 LAB — BASIC METABOLIC PANEL
ANION GAP: 9 (ref 5–15)
ANION GAP: 13 (ref 5–15)
Anion gap: 11 (ref 5–15)
BUN: 37 mg/dL — AB (ref 6–20)
BUN: 39 mg/dL — ABNORMAL HIGH (ref 6–20)
BUN: 39 mg/dL — ABNORMAL HIGH (ref 6–20)
CALCIUM: 8.9 mg/dL (ref 8.9–10.3)
CALCIUM: 8.9 mg/dL (ref 8.9–10.3)
CHLORIDE: 95 mmol/L — AB (ref 101–111)
CO2: 16 mmol/L — ABNORMAL LOW (ref 22–32)
CO2: 18 mmol/L — AB (ref 22–32)
CO2: 15 mmol/L — AB (ref 22–32)
CREATININE: 2.03 mg/dL — AB (ref 0.61–1.24)
CREATININE: 2.15 mg/dL — AB (ref 0.61–1.24)
Calcium: 8.9 mg/dL (ref 8.9–10.3)
Chloride: 94 mmol/L — ABNORMAL LOW (ref 101–111)
Chloride: 96 mmol/L — ABNORMAL LOW (ref 101–111)
Creatinine, Ser: 2.38 mg/dL — ABNORMAL HIGH (ref 0.61–1.24)
GFR calc Af Amer: 34 mL/min — ABNORMAL LOW (ref >60)
GFR, EST AFRICAN AMERICAN: 28 mL/min — AB (ref >60)
GFR, EST AFRICAN AMERICAN: 32 mL/min — AB (ref >60)
GFR, EST NON AFRICAN AMERICAN: 30 mL/min — AB (ref >60)
GFR, EST NON AFRICAN AMERICAN: 25 mL/min — AB (ref >60)
GFR, EST NON AFRICAN AMERICAN: 28 mL/min — AB (ref >60)
GLUCOSE: 101 mg/dL — AB (ref 65–99)
Glucose, Bld: 110 mg/dL — ABNORMAL HIGH (ref 65–99)
Glucose, Bld: 109 mg/dL — ABNORMAL HIGH (ref 65–99)
Potassium: 6.1 mmol/L — ABNORMAL HIGH (ref 3.5–5.1)
Potassium: 6.3 mmol/L (ref 3.5–5.1)
Potassium: 5.5 mmol/L — ABNORMAL HIGH (ref 3.5–5.1)
SODIUM: 122 mmol/L — AB (ref 135–145)
SODIUM: 121 mmol/L — AB (ref 135–145)
Sodium: 124 mmol/L — ABNORMAL LOW (ref 135–145)

## 2016-03-04 LAB — GLUCOSE, CAPILLARY: GLUCOSE-CAPILLARY: 100 mg/dL — AB (ref 65–99)

## 2016-03-04 LAB — CULTURE, BLOOD (ROUTINE X 2)
Culture: NO GROWTH
Culture: NO GROWTH

## 2016-03-04 LAB — MAGNESIUM: MAGNESIUM: 2.1 mg/dL (ref 1.7–2.4)

## 2016-03-04 LAB — PROCALCITONIN: Procalcitonin: 0.26 ng/mL

## 2016-03-04 LAB — TROPONIN I: Troponin I: 0.05 ng/mL (ref <0.03)

## 2016-03-04 LAB — OSMOLALITY: OSMOLALITY: 282 mosm/kg (ref 275–295)

## 2016-03-04 LAB — LACTIC ACID, PLASMA: Lactic Acid, Venous: 1.5 mmol/L (ref 0.5–1.9)

## 2016-03-04 MED ORDER — FUROSEMIDE 10 MG/ML IJ SOLN
40.0000 mg | Freq: Once | INTRAMUSCULAR | Status: AC
  Administered 2016-03-04: 40 mg via INTRAVENOUS
  Filled 2016-03-04: qty 4

## 2016-03-04 MED ORDER — ATROPINE SULFATE 0.4 MG/ML IJ SOLN
0.4000 mg | Freq: Once | INTRAMUSCULAR | Status: DC
  Filled 2016-03-04: qty 1

## 2016-03-04 MED ORDER — ALBUTEROL SULFATE (2.5 MG/3ML) 0.083% IN NEBU
2.5000 mg | INHALATION_SOLUTION | RESPIRATORY_TRACT | Status: DC | PRN
  Administered 2016-03-09 – 2016-03-15 (×7): 2.5 mg via RESPIRATORY_TRACT
  Filled 2016-03-04 (×7): qty 3

## 2016-03-04 MED ORDER — MIDAZOLAM HCL 2 MG/2ML IJ SOLN
1.0000 mg | INTRAMUSCULAR | Status: AC | PRN
  Administered 2016-03-09 – 2016-03-10 (×3): 1 mg via INTRAVENOUS
  Filled 2016-03-04 (×2): qty 2

## 2016-03-04 MED ORDER — INSULIN ASPART 100 UNIT/ML IV SOLN
10.0000 [IU] | Freq: Once | INTRAVENOUS | Status: AC
Start: 2016-03-04 — End: 2016-03-04
  Administered 2016-03-04: 10 [IU] via INTRAVENOUS

## 2016-03-04 MED ORDER — ORAL CARE MOUTH RINSE
15.0000 mL | OROMUCOSAL | Status: DC
  Administered 2016-03-04 – 2016-03-12 (×75): 15 mL via OROMUCOSAL

## 2016-03-04 MED ORDER — SODIUM CHLORIDE 0.9 % IV SOLN
INTRAVENOUS | Status: AC
  Filled 2016-03-04: qty 10

## 2016-03-04 MED ORDER — DEXTROSE 50 % IV SOLN
1.0000 | Freq: Once | INTRAVENOUS | Status: AC
Start: 2016-03-04 — End: 2016-03-04
  Administered 2016-03-04: 50 mL via INTRAVENOUS

## 2016-03-04 MED ORDER — FOLIC ACID 1 MG PO TABS
1.0000 mg | ORAL_TABLET | Freq: Every day | ORAL | Status: DC
Start: 2016-03-05 — End: 2016-03-12
  Administered 2016-03-05 – 2016-03-12 (×8): 1 mg
  Filled 2016-03-04 (×8): qty 1

## 2016-03-04 MED ORDER — CEFEPIME HCL 1 G IJ SOLR
1.0000 g | INTRAMUSCULAR | Status: DC
  Filled 2016-03-04: qty 1

## 2016-03-04 MED ORDER — PANTOPRAZOLE SODIUM 40 MG PO PACK
40.0000 mg | PACK | Freq: Every day | ORAL | Status: DC
  Administered 2016-03-05 – 2016-03-11 (×7): 40 mg
  Filled 2016-03-04 (×7): qty 20

## 2016-03-04 MED ORDER — ETOMIDATE 2 MG/ML IV SOLN
INTRAVENOUS | Status: DC | PRN
  Administered 2016-03-04: 20 mg via INTRAVENOUS

## 2016-03-04 MED ORDER — FUROSEMIDE 10 MG/ML IJ SOLN
80.0000 mg | Freq: Three times a day (TID) | INTRAMUSCULAR | Status: DC
  Administered 2016-03-04 – 2016-03-05 (×4): 80 mg via INTRAVENOUS
  Filled 2016-03-04 (×5): qty 8

## 2016-03-04 MED ORDER — FENTANYL CITRATE (PF) 2500 MCG/50ML IJ SOLN
10.0000 ug/h | INTRAMUSCULAR | Status: DC
  Administered 2016-03-04: 7.5 ug/h via INTRAVENOUS
  Administered 2016-03-04: 10 ug/h via INTRAVENOUS
  Filled 2016-03-04: qty 50

## 2016-03-04 MED ORDER — FENTANYL 2500MCG IN NS 250ML (10MCG/ML) PREMIX INFUSION
25.0000 ug/h | INTRAVENOUS | Status: DC
  Administered 2016-03-04: 50 ug/h via INTRAVENOUS
  Administered 2016-03-05: 100 ug/h via INTRAVENOUS
  Administered 2016-03-08: 50 ug/h via INTRAVENOUS
  Filled 2016-03-04 (×3): qty 250

## 2016-03-04 MED ORDER — VITAMIN B-1 100 MG PO TABS
100.0000 mg | ORAL_TABLET | Freq: Every day | ORAL | Status: DC
  Administered 2016-03-05 – 2016-03-12 (×8): 100 mg
  Filled 2016-03-04 (×8): qty 1

## 2016-03-04 MED ORDER — ADULT MULTIVITAMIN W/MINERALS CH
1.0000 | ORAL_TABLET | Freq: Every day | ORAL | Status: DC
  Administered 2016-03-05 – 2016-03-12 (×8): 1
  Filled 2016-03-04 (×8): qty 1

## 2016-03-04 MED ORDER — CHLORHEXIDINE GLUCONATE 0.12% ORAL RINSE (MEDLINE KIT)
15.0000 mL | Freq: Two times a day (BID) | OROMUCOSAL | Status: DC
  Administered 2016-03-04 – 2016-03-12 (×16): 15 mL via OROMUCOSAL

## 2016-03-04 MED ORDER — DOBUTAMINE IN D5W 4-5 MG/ML-% IV SOLN
5.0000 ug/kg/min | INTRAVENOUS | Status: DC
  Administered 2016-03-04: 5 ug/kg/min via INTRAVENOUS
  Filled 2016-03-04 (×2): qty 250

## 2016-03-04 MED ORDER — FENTANYL BOLUS VIA INFUSION
25.0000 ug | INTRAVENOUS | Status: DC | PRN
  Administered 2016-03-07 – 2016-03-09 (×2): 25 ug via INTRAVENOUS
  Filled 2016-03-04: qty 25

## 2016-03-04 MED ORDER — CALCIUM GLUCONATE 10 % IV SOLN
1.0000 g | Freq: Once | INTRAVENOUS | Status: AC
  Administered 2016-03-04: 1 g via INTRAVENOUS
  Filled 2016-03-04: qty 10

## 2016-03-04 MED ORDER — FENTANYL CITRATE (PF) 100 MCG/2ML IJ SOLN
50.0000 ug | Freq: Once | INTRAMUSCULAR | Status: AC
  Administered 2016-03-04: 50 ug via INTRAVENOUS

## 2016-03-04 MED ORDER — SODIUM BICARBONATE 8.4 % IV SOLN
INTRAVENOUS | Status: DC
  Administered 2016-03-04 (×2): via INTRAVENOUS
  Filled 2016-03-04 (×2): qty 100

## 2016-03-04 MED ORDER — DEXTROSE 50 % IV SOLN
INTRAVENOUS | Status: AC
  Filled 2016-03-04: qty 50

## 2016-03-04 MED ORDER — ATROPINE SULFATE 1 MG/10ML IJ SOSY
PREFILLED_SYRINGE | INTRAMUSCULAR | Status: AC
  Administered 2016-03-04: 0.5 mg
  Filled 2016-03-04: qty 10

## 2016-03-04 MED ORDER — SODIUM POLYSTYRENE SULFONATE 15 GM/60ML PO SUSP
30.0000 g | Freq: Once | ORAL | Status: AC
  Administered 2016-03-04: 30 g via ORAL
  Filled 2016-03-04: qty 120

## 2016-03-04 MED ORDER — ROCURONIUM BROMIDE 100 MG/10ML IV SOLN
INTRAVENOUS | Status: DC | PRN
Start: 2016-03-04 — End: 2016-03-04
  Administered 2016-03-04: 50 mg via INTRAVENOUS

## 2016-03-04 MED ORDER — IPRATROPIUM-ALBUTEROL 0.5-2.5 (3) MG/3ML IN SOLN
3.0000 mL | Freq: Four times a day (QID) | RESPIRATORY_TRACT | Status: DC
  Administered 2016-03-04 – 2016-03-12 (×31): 3 mL via RESPIRATORY_TRACT
  Filled 2016-03-04 (×26): qty 3
  Filled 2016-03-04: qty 30
  Filled 2016-03-04 (×3): qty 3

## 2016-03-04 MED ORDER — MIDAZOLAM HCL 2 MG/2ML IJ SOLN
1.0000 mg | INTRAMUSCULAR | Status: DC | PRN
  Administered 2016-03-06 – 2016-03-11 (×4): 1 mg via INTRAVENOUS
  Filled 2016-03-04 (×5): qty 2

## 2016-03-04 MED ORDER — BUDESONIDE 0.5 MG/2ML IN SUSP
0.5000 mg | Freq: Two times a day (BID) | RESPIRATORY_TRACT | Status: DC
  Administered 2016-03-04 – 2016-03-16 (×24): 0.5 mg via RESPIRATORY_TRACT
  Filled 2016-03-04 (×24): qty 2

## 2016-03-04 NOTE — Consult Note (Addendum)
CARDIOLOGY CONSULT NOTE   Patient ID: CHRISHAUN STOEN MRN: [id number] DOB/AGE: 1936-03-10 80 y.o.  Admit Date: 02/21/2016 Referring Physician: TRH-Chui  Primary Physician: [name], MD Consulting Cardiologist: Kate Sable MD Primary Cardiologist: Dorris Carnes MD Reason for Consultation:   Clinical Summary Mr. Condit is a medically complicated 80 y.o.male with known history of NICM, ER of 15%-20%, hypertension, pulmonary fibrosis, and ETOH/Tobacco abuse. Was last seen at [place] for cardiomyopathy, atrial fib with RVR, acute on chronic systolic CHF, and NSVT after being admitted for fall at home fracturing multiple left sided ribs, L-3-L4 transverse, left acetabular fracture, and left pneumothorax. He had a tracheostomy performed on 01/10/2016. Sent to [facility], and decanulated on 02/09/2016. He was discharged home from the [facility] on 02/23/2016.   He presented to ER after experiencing worsening dyspnea, with nausea, vomiting and weakness. He was diagnosed with sepsis, lactic acid level of 7 and was hypotensive. On arrival to the emergency room is 106/65, heart rate 109 O2 sat 92% respirations 34 he was afebrile at 97.1. Sodium 126, potassium 4.5 chloride 92, glucose 138, creatinine 1.82. Hemoglobin 10.7 hematocrit 32.9 white blood cells 17,000 platelet is 428. Blood cultures were drawn with results pending. Chest x-ray revealed left lower lobe infiltrate and associated effusion. EKG revealed atrial fibrillation with right bundle branch block and intraventricular conduction delay.  Over the weekend, the patient was treated with IV fluids due to sepsis, hypotension, is being treated for pneumonia. However chest x-ray is worsening from pneumonia to CHF with a larger right pleural effusion. We are asked for cardiology recommendations and assistance in the setting for help with diureses and management of CHF.  He is positive for 458 cc since admission. He has been taken off of diuretics.  He is on DT precautions. Remains on amiodarone for heart rate control,    No Known Allergies  Medications Scheduled Medications: . amiodarone  100 mg Oral Daily  . ceFEPime (MAXIPIME) IV  1 g Intravenous Q12H  . docusate sodium  100 mg Oral BID  . fluticasone furoate-vilanterol  1 puff Inhalation Daily  . folic acid  1 mg Oral Daily  . heparin  5,000 Units Subcutaneous Q8H  . hydrocortisone cream   Topical BID  . mirtazapine  15 mg Oral QHS  . multivitamin with minerals  1 tablet Oral Daily  . pantoprazole  40 mg Oral Daily  . potassium chloride  10 mEq Oral BID  . QUEtiapine  50 mg Oral QHS  . senna-docusate  1 tablet Oral QHS  . sertraline  75 mg Oral Daily  . sodium chloride flush  3 mL Intravenous Q12H  . sodium polystyrene  30 g Oral Once  . thiamine  100 mg Oral Daily  . umeclidinium bromide  1 puff Inhalation Daily        PRN Medications:  sodium chloride, acetaminophen, levalbuterol, ondansetron **OR** ondansetron (ZOFRAN) IV, oxyCODONE-acetaminophen, sodium chloride flush, zolpidem   Past Medical History:  Diagnosis Date  . Alcohol abuse    family reports that [pt will drink at least 6 beers or more a day,   . Allergic rhinitis   . Cardiomyopathy [id number]   Acute systolic CHF at presentation; 05/2009 cardiac cath- 06/01/09  non obst coronary artery disease with EF 25%  . CHF (congestive heart failure) ([place])   . COPD (chronic obstructive pulmonary disease) ([place])   . Coronary artery disease   . Hypertension   . Osteoarthritis   . Psoriasis   .  Pulmonary fibrosis ([place])    12/2004 with basilar fibrotic changes;  PFT's 06/07/09 FEV1 1.98 (68%) raio 53 no better after B2,  DLC0105%  . Skin cancer   . Subdural hygroma 03/2005   bilateral   . Tobacco abuse    100 pack years; quit    Past Surgical History:  Procedure Laterality Date  . CHEST TUBE INSERTION    . HAND SURGERY    . INGUINAL HERNIA REPAIR    . PERCUTANEOUS TRACHEOSTOMY N/A 01/10/2016    Procedure: PERCUTANEOUS TRACHEOSTOMY;  Surgeon: Judeth Horn, MD;  Location: [place] [facility];  Service: General;  Laterality: N/A;  . ROTATOR CUFF REPAIR      Family History  Problem Relation Age of Onset  . Cancer Mother 62    gynecologic  . Aneurysm Father 21    cerebral aneurysm  . Asthma Maternal Grandmother     Social History Mr. Dresen reports that he has been smoking Cigarettes.  He has a 100.00 pack-year smoking history. He has quit using smokeless tobacco. Mr. Botta reports that he drinks about 17.5 oz of alcohol per week .  Review of Systems Complete review of systems are found to be negative unless outlined in H&P above.  Physical Examination Blood pressure 101/90, pulse 96, temperature (!) 96.7 F (35.9 C), temperature source Oral, resp. rate 12, height 5\' 10"  (1.778 m), weight 163 lb 5.8 oz (74.1 kg), SpO2 92 %.  Intake/Output Summary (Last 24 hours) at 03/04/16 0920 Last data filed at 03/03/16 1745  Gross per 24 hour  Intake              120 ml  Output              450 ml  Net             -330 ml    Telemetry: Atrial fib with slow ventricular response, and intraventricular conduction delay.   GEN: Ill appearing , cachetic, HEENT: Conjunctiva normal, and left eyelid erythematous, oropharynx clear with moist mucosa. Neck: Supple, no elevated JVP or carotid bruits, no thyromegaly.Healing trach incision, with bandage. Lungs: Shallow breathing with poor respiratory effort, frequent productive coughing. Diminished in the bases.  Cardiac:Irregular rate and rhythm,bradycardiac, distant heart sounds, no S3 or significant systolic murmur, no pericardial rub. Abdomen: Soft, nontender, no hepatomegaly, bowel sounds present, no guarding or rebound. Extremities: No pitting edema, distal pulses 2+. Skin: Warm and dry. Musculoskeletal: No kyphosis. Barrel chest, shiny skin without crepitus.  Neuropsychiatric: Alert and oriented x3, affect grossly appropriate. Hard to get good  history due to weakness.   Prior Cardiac Testing/Procedures  1.Echocardigram: 01/01/2016 Left ventricle: The cavity size was mildly dilated. Wall   thickness was normal. Systolic function was severely reduced. The   estimated ejection fraction was in the range of 25% to 30%.   Diffuse hypokinesis. Indeterminant diastolic function. - Aortic valve: There was no stenosis. - Mitral valve: Mildly calcified annulus. There was no significant   regurgitation. - Left atrium: The atrium was mildly dilated. - Right ventricle: The cavity size was mildly dilated. Systolic   function was mildly reduced. - Right atrium: The atrium was mildly dilated. - Pulmonary arteries: No complete TR doppler jet so unable to   estimate PA systolic pressure. - Systemic veins: IVC measured 2.0 cm with < 50% respirophasic   variation suggesting RA pressure 8 mmHg. . Lab Results  Basic Metabolic Panel:  Recent Labs Lab 02/19/2016 1705 02/29/16 0545 03/01/16 [id number] 03/02/16 0446 03/04/16 0405  NA 126* 126* 126* 125* 122*  K 4.5 4.3 4.1 4.9 6.1*  CL 92* 97* 99* 99* 95*  CO2 17* 20* 19* 19* 16*  GLUCOSE 138* 139* 127* 105* 110*  BUN 30* 31* 27* 23* 37*  CREATININE 1.82* 1.59* 1.23 1.05 2.03*  CALCIUM 9.3 8.2* 8.4* 8.8* 8.9    Liver Function Tests:  Recent Labs Lab 03/14/2016 1705 02/29/16 0545 03/01/16 0452  AST 32 33 48*  ALT 33 45 72*  ALKPHOS 95 81 86  BILITOT 1.6* 1.3* 1.2  PROT 7.2 6.0* 6.0*  ALBUMIN 3.7 3.1* 3.1*    CBC:  Recent Labs Lab 02/20/2016 1705 02/29/16 0545 03/01/16 0452 03/02/16 0446 03/04/16 0405  WBC 17.0* 13.1* 9.4 10.3 13.5*  NEUTROABS 12.2*  --   --   --   --   HGB 10.7* 8.7* 9.0* 9.1* 9.7*  HCT 32.9* 26.6* 28.0* 28.3* 31.1*  MCV 100.0 98.9 102.2* 101.1* 103.0*  PLT 428* 323 315 335 344    Radiology: Dg Chest Port 1 View  Result Date: 03/04/2016 CLINICAL DATA:  Cardiomyopathy.  Congestive heart failure EXAM: PORTABLE CHEST 1 VIEW COMPARISON:  March 01, 2016  FINDINGS: There is persistent interstitial pulmonary edema with airspace consolidation in the lateral left perihilar region which is stable. There is a right pleural effusion which appears larger than on the prior study. There is cardiomegaly with pulmonary venous hypertension. There is persistent left base atelectasis. There is atherosclerotic calcification in the aorta. IMPRESSION: Findings consistent with a degree of congestive heart failure with slightly larger right pleural effusion. Suspect superimposed pneumonia left upper lobe/left perihilar region. Stable atelectasis left base. Cardiac silhouette unchanged. There is aortic atherosclerosis. Electronically Signed   By: Lowella Grip III M.D.   On: 03/04/2016 08:45     ECG: Atrial fib with RBBB and intraventricular conduction delay.    Impression and Recommendations   1. NICM: Most recent echocardiogram demonstrated EF of 25-30%. Became hypotensive with IV diuretics. Remains hyponatremic sodium 122, with renal insufficiency, creatinine of 2.03. He is not receiving IV fluids at this time or diuretic therapy.   Difficult situation with systolic dysfunction, renal insufficiency, and sepsis. Cardiac output is decreased making it difficulty for diuresis. On discharge from [facility] his sodium and renal function were normal.   Consider gentle hydration with IV lasix daily to stimulate diureses. After discussion with Dr. Bronson Ing will begin low dose dobutamine and once daily lasix IV, but determine need each day. This will assist in CO, renal status, and diuresis. Will make decisions daily on management.   2.Atrial fib with slow ventricular response: He remains on amiodarone at 100 mg daily, and heparin SQ for anticoagulation. Consider discontinuing amiodarone as this is affecting BP and HR, although it will take time to wash out.  Use BB for HR prn.  3. Health Care Acquired Pneumonia with sepsis:  On vancomycin IV per pharmacy dosing. He is on neb  treatments. Recommend flutter valve for assistance in expectoration.   4. Pulmonary fibrosis with pleural effusion:  Complicating treatment regimen and respiratory recovery status. Consider thoracentesis at later date if medical management does not improve status.    Signed: Phill Myron. Lawrence NP [place]  03/04/2016, 9:20 AM Co-Sign MD  The patient was seen and examined, and I agree with the history, physical exam, assessment and plan as documented above.  Will initiate low dose dobutamine infusion for low output acute systolic heart failure. Will give one dose of IV Lasix and monitor renal  function once he is on dobutamine, which will assist with keeping mean arterial pressures elevated. Would avoid IV fluids at this time. If right pleural effusion increases in size, may need to consider interventional radiology assisted thoracentesis. Would repeat chest xray tomorrow. Will discontinue amiodarone (HR 40 bpm range).   Kate Sable, MD, [facility]  Pt having long pauses 4-6 seconds. Dobutamine yet to be initiated. Will give atropine and place temporary pacing pads.   Time spent: 75 minutes.  03/04/2016 11:11 AM

## 2016-03-04 NOTE — Progress Notes (Signed)
Pharmacy Antibiotic Note  Chad Rogers is a 80 y.o. male admitted on 03/17/2016 with pneumonia.  Pharmacy has been consulted for vancomycin and cefepime dosing. Vancomycin stopped, worsening renal function.  Culture data (-), final.  Plan: Change Cefepime 1gm IV q24h Monitor V/S and clinical progress  Height: 5\' 10"  (177.8 cm) Weight: 163 lb 5.8 oz (74.1 kg) IBW/kg (Calculated) : 73  Temp (24hrs), Avg:96.7 F (35.9 C), Min:96.6 F (35.9 C), Max:96.9 F (36.1 C)   Recent Labs Lab 02/27/2016 1641 03/04/2016 1705 03/02/2016 2307 02/29/16 0545 02/29/16 0944 02/29/16 1247 03/01/16 0452 03/02/16 0446 03/04/16 0405  WBC  --  17.0*  --  13.1*  --   --  9.4 10.3 13.5*  CREATININE  --  1.82*  --  1.59*  --   --  1.23 1.05 2.03*  LATICACIDVEN 7.37*  --  3.1*  --  1.9 2.0*  --   --   --     Estimated Creatinine Clearance: 31 mL/min (by C-G formula based on SCr of 2.03 mg/dL (H)).    No Known Allergies  Antimicrobials this admission: Vancomycin 10/11 >> 10/16 Cefepime 10/11 >>   Dose adjustments this admission: 10/16-  Cefepime adjusted for worsening renal function  Microbiology results: 10/11 BCx: (-) final 10/11 UCx: ng final  Thank you for allowing pharmacy to be a part of this patient's care.  Pricilla Larsson, [facility]  03/04/2016 10:26 AM

## 2016-03-04 NOTE — Anesthesia Procedure Notes (Signed)
Procedure Name: Intubation Date/Time: 03/04/2016 12:30 PM Performed by: Charmaine Downs Pre-anesthesia Checklist: Patient identified, Patient being monitored, Timeout performed, Emergency Drugs available and Suction available Patient Re-evaluated:Patient Re-evaluated prior to inductionOxygen Delivery Method: Ambu bag Preoxygenation: Pre-oxygenation with 100% oxygen Intubation Type: IV induction, Rapid sequence and Cricoid Pressure applied Laryngoscope Size: Mac and 3 Grade View: Grade I Tube type: Oral Tube size: 7.0 (Subglottic) mm Number of attempts: 1 Airway Equipment and Method: stylet Placement Confirmation: ETT inserted through vocal cords under direct vision,  positive ETCO2 and breath sounds checked- equal and bilateral Secured at: 22 cm Tube secured with: Tape Dental Injury: Teeth and Oropharynx as per pre-operative assessment  Difficulty Due To: Difficulty was anticipated Comments: Difficulty possible due to old partially healed tracheostomy

## 2016-03-04 NOTE — Progress Notes (Signed)
Patient had sudden decline in status, with asystole, and apnea, which was transient to begin with, but recurred 2. Patient's heart rate was 30-40, and an external pacemaker was placed. Atropine 0.5 mg was given IV. Oxygen was increased to 15 L via nasal cannula. Dr.Chui and Dr. Bronson Ing were notified. In the interim stat BMET, magnesium, and CBC were ordered. ABG was ordered.  Insulin and calcium gluconate were given on Dr. Rhetta Mura orders.   Dobutamine drip was started at 5 mics per kilogram per minute. The patient became hypotensive again and bradycardic and therefore external pacemaker rate and output was increased. Heart rate increased to 70-100 bpm. He became more tachypneic, O2 via nasal cannula was exchanged for a nonrebreather mask. It was felt this patient's status continued to deteriorate and therefore plans were made to transfer to [facility] to critical care medicine admission. In the interim the patient was in need of intubation. Anesthesia was called to intubate.  Labs were reviewed by Dr. Wyline Copas. Sodium 121, potassium 6.3, chloride 94, CO2 39, creatinine 2.30. ABG was drawn pH 7.245, PCO2 29, P O2, 90.6, bicarbonate 13.7.    After intubation, dobutamine was continued at 7.5 mics per kilogram per minute. Transferred to be completed by Dr. Wyline Copas to critical care medicine at [facility] once patient is stabilized. The patient will need central line access. This can be completed at [facility] or at [facility] after transfer, a Dr. Rhetta Mura discretion.

## 2016-03-04 NOTE — Progress Notes (Signed)
PROGRESS NOTE    Chad Rogers  [id number] DOB: [birth year] DOA: 03/04/2016 PCP: [name], MD    Brief Narrative:   80 y.o. male with multiple medical problems including chronic afib not on anticoagulation, hx of pulmonary fibrosis, systolic CHF 123456, HTN, HLD, debility, tobacco abuse, admitted after a fall last month, Fx several ribs, intubated, trached, and subsequently improved, went to [place], rehab, patient was then discharged home one week patient presented to the emergency department with complaints of worsening shortness of breath. Patient was found to be hypotensive, hypoxic with an elevated lactic acid of 7 area patient was found to have evidence of pneumonia on chest x-ray with severe sepsis. She was admitted for further workup  During this hospital course, blood pressures improved with IVF. Sepsis physiology improved. Patient noted to be mildly short of breath and CXR demonstrated pulmonary edema with effusion. Lasix was given with some improvement. No significant urine output noted and follow up chest xray obtained on 10/16 for interval change. Study demonstrated continued edema with larger effusion. Consulted Cardiology for assistance. During evaluation, patient noted to have pronounced bradycardia and pauses. Atropine given and patient started on external pacer. HR remained labile and bradycardic and dopamine started. Patient's O2 sats began to drop to the 70's with increased work of breathing and decreased level of consciousness. Patient was intubated.  Of note, repeat K of 6.3, CO2 of 18, hgb of 9.7. Patient was given kayexelate for 6.1 potassium this AM without result. Ten units insulin IV with dextrose given. One amp of Ca given. Patient started on bicarb gtt. Case was discussed with Critical Care who has accepted patient in transfer to [facility].  Assessment & Plan:   Principal Problem:   Sepsis ([place]) Active Problems:   Hypertension   Pulmonary fibrosis (HCC)   Tobacco  abuse   Traumatic fracture of ribs with pneumothorax   Multiple rib fractures   Chronic systolic CHF (congestive heart failure) (HCC)   Atrial fibrillation, persistent ([place])   Debility   HCAP (healthcare-associated pneumonia)   Protein-calorie malnutrition, severe   1. Healthcare associated pneumonia with severe sepsis with septic shock present on admission 1. Presented clinically septic with evidence of pneumonia and septic shock 2. Lactate improved and sepsis physiology appeared to have improved 3. Patient had been continue initially on vanc and cefepime 4. Patient remained afebrile 5. MRSA screen noted to be neg <30 days ago, thus vancomycin discontinued on 10/16 6. This AM, WBC noted to be higher at 14.9k 2. Hypertension 1. Blood pressures improved and had remained stable this admission 3. Pulmonary fibrosis 1. Patient had been followed by Dr. Melvyn Novas prior to admission 2. Had remained stable 4. COPD 1. No significant wheezing auscultated on exam 2. Patient now with increased O2 requirements and is intubated - see below 5. Cardiomyopathy with acutely decompensated Chronic systolic CHF with EF of [id number] 1. Patient briefly required IV fluids at time of admission secondary to septic shock. 2. IV fluids had remained on hold 3. Recent chest x-ray with awakened demonstrated pulmonary edema consistent with patient's heart failure 4. As patient's blood pressure improved, IV Lasix was given. 5. Patient initially seemed improved clinically 6. On 03/04/2016, patient's hyponatremia seem to have worsened with worsening renal function. 7. Repeat chest x-ray was obtained, demonstrating continued edema and worse right-sided effusion are my own read 8. Cardiology was consulted for systems with acute systolic heart failure and management 9. During evaluation, patient noted to be profoundly bradycardic with pauses. 10.  Patient was started on transcutaneous pacemaker with continued periods of  bradycardia into the 20s. 11. Dopamine infusion was started with heart rate better controlled 12. Patient also began to have increased O2 requirements, increased lethargy and decision was made to intubate patient for airway protection. 13. Blood work demonstrated repeat potassium of 6.3. See below. Patient did not respond to Kayexalate. Patient was given 10 units IV insulin with dextrose. One amp of calcium given. 14. Patient also noted to have pH is 7.2 with serum bicarbonate of 18. Bicarbonate infusion started 15. Case discussed with PCCM who has accepted patient in transfer 7. Discussed with [facility] cardiology service, who has notified cardiology at [facility] 17. Transfer orders placed 18. Attempted to call patient's wife. No answer. Called and updated patient's daughter with update. All questions answered. 6. Prior tobacco abuse 1. Seems stable at this time 7. CAD 1. Stable at this time 2. Cardiology to follow 8. Lactic acidosis 1. Earlier improved with IVF hydration 9. Bladder outlet obstruction 1. Briefly noted to have L obstruction 2. Patient now with indwelling Foley catheter 3. Continue for now  DVT prophylaxis: Heparin subcutaneous Code Status: Full code Family Communication: Patient in room, updated patient's daughter Disposition Plan: Uncertain at this time  Consultants:   Cardiology  Procedures:   Intubation 10/16  Antimicrobials: Anti-infectives    Start     Dose/Rate Route Frequency Ordered Stop   03/04/16 2200  ceFEPIme (MAXIPIME) 1 g in dextrose 5 % 50 mL IVPB     1 g 100 mL/hr over 30 Minutes Intravenous Every 24 hours 03/04/16 1030     03/02/16 1200  ceFEPIme (MAXIPIME) 1 g in dextrose 5 % 50 mL IVPB  Status:  Discontinued     1 g 100 mL/hr over 30 Minutes Intravenous Every 12 hours 03/02/16 1145 03/04/16 1030   03/02/16 1200  vancomycin (VANCOCIN) IVPB 750 mg/150 ml premix  Status:  Discontinued     750 mg 150 mL/hr over 60 Minutes  Intravenous Every 12 hours 03/02/16 1150 03/04/16 0755   02/29/16 1800  ceFEPIme (MAXIPIME) 1 g in dextrose 5 % 50 mL IVPB  Status:  Discontinued     1 g 100 mL/hr over 30 Minutes Intravenous Every 24 hours 03/15/2016 2103 03/02/16 1145   02/29/16 1800  vancomycin (VANCOCIN) IVPB 1000 mg/200 mL premix  Status:  Discontinued     1,000 mg 200 mL/hr over 60 Minutes Intravenous Every 24 hours 03/19/2016 2103 03/02/16 1150   02/19/2016 1800  ceFEPIme (MAXIPIME) 2 g in dextrose 5 % 50 mL IVPB     2 g 100 mL/hr over 30 Minutes Intravenous  Once 02/24/2016 1746 03/01/2016 2044   02/18/2016 1800  vancomycin (VANCOCIN) IVPB 1000 mg/200 mL premix     1,000 mg 200 mL/hr over 60 Minutes Intravenous  Once 02/27/2016 1746 03/14/2016 1948      Subjective: Denies sob or chest pain  Objective: Vitals:   03/04/16 0800 03/04/16 0900 03/04/16 1121 03/04/16 1244  BP: 101/90 113/62    Pulse: 96 (!) 25    Resp:  18    Temp:      TempSrc:      SpO2:  (!) 60% 92% 92%  Weight:      Height:        Intake/Output Summary (Last 24 hours) at 03/04/16 1307 Last data filed at 03/03/16 1745  Gross per 24 hour  Intake  0 ml  Output              450 ml  Net             -450 ml   Filed Weights   02/29/16 0500 03/01/16 0400 03/02/16 0400  Weight: 68.2 kg (150 lb 5.7 oz) 70 kg (154 lb 5.2 oz) 74.1 kg (163 lb 5.8 oz)    Examination:  General exam: Awake, appears intermittently lethargic Respiratory system: increased respiratory effort, coarse breath sounds Cardiovascular system: Bradycardic, S1-S2 Gastrointestinal system: Soft, positive bowel sounds Central nervous system: CN II to go grossly intact, no seizures Extremities: No clubbing, no joint deformities Skin: Normal skin turgor, no rashes no pallor Psychiatry: Normal mood, no visual hallucinations  Data Reviewed: I have personally reviewed following labs and imaging studies  CBC:  Recent Labs Lab 02/24/2016 1705 02/29/16 0545 03/01/16 0452  03/02/16 0446 03/04/16 0405 03/04/16 1120  WBC 17.0* 13.1* 9.4 10.3 13.5* 14.9*  NEUTROABS 12.2*  --   --   --   --  11.4*  HGB 10.7* 8.7* 9.0* 9.1* 9.7* 9.7*  HCT 32.9* 26.6* 28.0* 28.3* 31.1* 30.8*  MCV 100.0 98.9 102.2* 101.1* 103.0* 102.7*  PLT 428* 323 315 335 344 [id number]   Basic Metabolic Panel:  Recent Labs Lab 02/29/16 0545 03/01/16 0452 03/02/16 0446 03/04/16 0405 03/04/16 1120  NA 126* 126* 125* 122* 121*  K 4.3 4.1 4.9 6.1* 6.3*  CL 97* 99* 99* 95* 94*  CO2 20* 19* 19* 16* 18*  GLUCOSE 139* 127* 105* 110* 109*  BUN 31* 27* 23* 37* 39*  CREATININE 1.59* 1.23 1.05 2.03* 2.38*  CALCIUM 8.2* 8.4* 8.8* 8.9 8.9  MG  --   --   --   --  2.1   GFR: Estimated Creatinine Clearance: 26.4 mL/min (by C-G formula based on SCr of 2.38 mg/dL (H)). Liver Function Tests:  Recent Labs Lab 03/01/2016 1705 02/29/16 0545 03/01/16 0452  AST 32 33 48*  ALT 33 45 72*  ALKPHOS 95 81 86  BILITOT 1.6* 1.3* 1.2  PROT 7.2 6.0* 6.0*  ALBUMIN 3.7 3.1* 3.1*   No results for input(s): LIPASE, AMYLASE in the last 168 hours. No results for input(s): AMMONIA in the last 168 hours. Coagulation Profile: No results for input(s): INR, PROTIME in the last 168 hours. Cardiac Enzymes: No results for input(s): CKTOTAL, CKMB, CKMBINDEX, TROPONINI in the last 168 hours. BNP (last 3 results) No results for input(s): PROBNP in the last 8760 hours. HbA1C: No results for input(s): HGBA1C in the last 72 hours. CBG: No results for input(s): GLUCAP in the last 168 hours. Lipid Profile: No results for input(s): CHOL, HDL, LDLCALC, TRIG, CHOLHDL, LDLDIRECT in the last 72 hours. Thyroid Function Tests: No results for input(s): TSH, T4TOTAL, FREET4, T3FREE, THYROIDAB in the last 72 hours. Anemia Panel: No results for input(s): VITAMINB12, FOLATE, FERRITIN, TIBC, IRON, RETICCTPCT in the last 72 hours. Sepsis Labs:  Recent Labs Lab 03/18/2016 1641 02/27/2016 2307 02/29/16 0944 02/29/16 1247  LATICACIDVEN  7.37* 3.1* 1.9 2.0*    Recent Results (from the past 240 hour(s))  Culture, blood (Routine x 2)     Status: None   Collection Time: 03/11/2016  4:51 PM  Result Value Ref Range Status   Specimen Description BLOOD RIGHT ARM  Final   Special Requests   Final    BOTTLES DRAWN AEROBIC AND ANAEROBIC AEB=12CC ANA=8CC   Culture NO GROWTH 5 DAYS  Final   Report Status 03/04/2016  FINAL  Final  Culture, blood (Routine x 2)     Status: None   Collection Time: 02/29/2016  5:05 PM  Result Value Ref Range Status   Specimen Description BLOOD LEFT HAND  Final   Special Requests   Final    BOTTLES DRAWN AEROBIC AND ANAEROBIC AEB=5CC ANA=4CC   Culture NO GROWTH 5 DAYS  Final   Report Status 03/04/2016 FINAL  Final  Urine culture     Status: None   Collection Time: 03/18/2016  9:00 PM  Result Value Ref Range Status   Specimen Description URINE, CLEAN CATCH  Final   Special Requests NONE  Final   Culture NO GROWTH Performed at [facility]   Final   Report Status 03/01/2016 FINAL  Final     Radiology Studies: Dg Chest Port 1 View  Result Date: 03/04/2016 CLINICAL DATA:  Intubation EXAM: PORTABLE CHEST 1 VIEW COMPARISON:  Portable exam 1243 hours compared 03/04/2016 FINDINGS: Tip of endotracheal tube projects 3.9 cm above carina. Nasogastric tube extends into stomach. External pacing leads project over chest. Enlargement of cardiac silhouette with pulmonary vascular congestion. Persistent pulmonary infiltrates question edema and layered RIGHT pleural effusion. Atelectasis at LEFT base. No pneumothorax. Atherosclerotic calcification aorta. IMPRESSION: Tube positions as above. Probable mild pulmonary edema with RIGHT pleural effusion and LEFT basilar atelectasis. Electronically Signed   By: Lavonia Dana M.D.   On: 03/04/2016 13:00   Dg Chest Port 1 View  Result Date: 03/04/2016 CLINICAL DATA:  Cardiomyopathy.  Congestive heart failure EXAM: PORTABLE CHEST 1 VIEW COMPARISON:  March 01, 2016  FINDINGS: There is persistent interstitial pulmonary edema with airspace consolidation in the lateral left perihilar region which is stable. There is a right pleural effusion which appears larger than on the prior study. There is cardiomegaly with pulmonary venous hypertension. There is persistent left base atelectasis. There is atherosclerotic calcification in the aorta. IMPRESSION: Findings consistent with a degree of congestive heart failure with slightly larger right pleural effusion. Suspect superimposed pneumonia left upper lobe/left perihilar region. Stable atelectasis left base. Cardiac silhouette unchanged. There is aortic atherosclerosis. Electronically Signed   By: Lowella Grip III M.D.   On: 03/04/2016 08:45    Scheduled Meds: . atropine  0.4 mg Intravenous Once  . ceFEPime (MAXIPIME) IV  1 g Intravenous Q24H  . docusate sodium  100 mg Oral BID  . fluticasone furoate-vilanterol  1 puff Inhalation Daily  . folic acid  1 mg Oral Daily  . furosemide  40 mg Intravenous Once  . heparin  5,000 Units Subcutaneous Q8H  . hydrocortisone cream   Topical BID  . mirtazapine  15 mg Oral QHS  . multivitamin with minerals  1 tablet Oral Daily  . pantoprazole  40 mg Oral Daily  . QUEtiapine  50 mg Oral QHS  . senna-docusate  1 tablet Oral QHS  . sertraline  75 mg Oral Daily  . calcium gluconate 1 GM IV      . sodium chloride flush  3 mL Intravenous Q12H  . thiamine  100 mg Oral Daily  . umeclidinium bromide  1 puff Inhalation Daily   Continuous Infusions: . DOBUTamine 7.5 mcg/kg/min (03/04/16 1229)  .  sodium bicarbonate  infusion 1000 mL       LOS: 5 days   Malachi Kinzler, Orpah Melter, MD [facility] Pager [phone]  If 7PM-7AM, please contact night-coverage [email or web address] Password [email or web address] 03/04/2016, 1:07 PM

## 2016-03-04 NOTE — Progress Notes (Signed)
Initially alert and able to swallow liquids this morning. Became less alert and heart rate declined below 40 beats per minute and at more that two occasions read asystole for more that 15 seconds. Cardiology on floor and ordered placement of external pacer pads. Atropine ordered and given. Dobutamine ordered and hung. Patient then intubated and fentanyl drip for comfort. Orders to sent to [facility] now waiting for a room.

## 2016-03-04 NOTE — H&P (Signed)
PULMONARY / CRITICAL CARE MEDICINE   Name: Chad Rogers MRN: [id number] DOB: 1936/02/15    ADMISSION DATE:  02/25/2016 CONSULTATION DATE:  03/04/16  REFERRING MD:  03/04/16  CHIEF COMPLAINT:  Dr. Wyline Copas   HISTORY OF PRESENT ILLNESS:   80 y/o M, smoker, with PMH of ETOH abuse, allergic rhinitis, psoriasis, skin cancer, osteoarthritis, CAD, HTN, atrial fibrillation (not on anticoagulation, Hx falls), systolic CHF (EF [id number]), HLD, pulmonary fibrosis and COPD (previously followed by Dr. Melvyn Novas - 2011) admitted to [facility] on 02/26/2016 complaints of SOB.    The patient recently had a prolonged hospitalization after a fall with several rib fractures.  During that admit, he had a prolonged intubation requiring tracheostomy and admission to an LTAC followed by rehab.  The patient was apparently discharged one week prior to presentation.  He reported nausea, vomiting, weakness initially that resolved.  Subsequently, he developed worsening shortness of breath & DOE.  On admit, he was hypotensive, hypoxic and had an elevated lactic acid (7).  Work up concerning for new infiltrate & volume depletion with AKI.  The patient was admitted per [facility] for further care.  He responded well to IVF's and oxygen initially.  He was later found to have pulmonary edema with associated effusions and treated with Lasix.  Follow up CXR evaluation showed enlarging effusions.  Cardiology was consulted for evaluation.  During their evaluation, he was found to have pronounced bradycardia and pauses.  Atropine was administered and he was started on an external pacemaker.  HR was labile and he was started on dopamine.  He had subsequent respiratory distress with hypoxemia (sats dropped to 70's) with altered level of consciousness and required intubation.  He developed further metabolic derangements (123XX123, CO2 18, Hgb 9.7) 10/16 and was transferred to [facility] for further care.    Current labs - 7.245 / 29 / 90 / 13.7.  Na 121, K  6.3, Cl 94, CO2 18, BUN 39 / Sr Cr 2.38, glucose 109, Mg 2.1, WBC 14.9, Hgb 9.7, MCV 102.7 and platelets 322.  CXR 10/16 with concern for pulmonary edema, layering effusions and left basilar atelectasis.  PAST MEDICAL HISTORY :  He  has a past medical history of Alcohol abuse; Allergic rhinitis; Cardiomyopathy (1.13.2011); CHF (congestive heart failure) ([place]); COPD (chronic obstructive pulmonary disease) ([place]); Coronary artery disease; Hypertension; Osteoarthritis; Psoriasis; Pulmonary fibrosis ([place]); Skin cancer; Subdural hygroma (03/2005); and Tobacco abuse.  PAST SURGICAL HISTORY: He  has a past surgical history that includes Inguinal hernia repair; Rotator cuff repair; Hand surgery; Percutaneous tracheostomy (N/A, 01/10/2016); and Chest tube insertion.  No Known Allergies  No current facility-administered medications on file prior to encounter.    Current Outpatient Prescriptions on File Prior to Encounter  Medication Sig  . acetaminophen (TYLENOL) 325 MG tablet Take 1-2 tablets (325-650 mg total) by mouth every 6 (six) hours as needed for mild pain. Max 2,000 mg /daily  . amiodarone (PACERONE) 100 MG tablet Take 1 tablet (100 mg total) by mouth daily.  . fluticasone furoate-vilanterol (BREO ELLIPTA) 100-25 MCG/INH AEPB Inhale 1 puff into the lungs daily.  . folic acid (FOLVITE) 1 MG tablet Take 1 tablet (1 mg total) by mouth daily.  Marland Kitchen levalbuterol (XOPENEX) 0.63 MG/3ML nebulizer solution Take 3 mLs (0.63 mg total) by nebulization every 6 (six) hours as needed for wheezing or shortness of breath.  . mirtazapine (REMERON) 15 MG tablet Take 1 tablet (15 mg total) by mouth at bedtime.  . Multiple Vitamin (MULTIVITAMIN WITH  MINERALS) TABS tablet Take 1 tablet by mouth daily.  . pantoprazole (PROTONIX) 40 MG tablet Take 1 tablet (40 mg total) by mouth daily.  . QUEtiapine (SEROQUEL) 50 MG tablet Take 1 tablet (50 mg total) by mouth at bedtime.  . senna-docusate (SENOKOT-S) 8.6-50 MG tablet Take  1 tablet by mouth at bedtime.  . sertraline (ZOLOFT) 50 MG tablet Take 1.5 tablets (75 mg total) by mouth daily.  Marland Kitchen thiamine 100 MG tablet Take 1 tablet (100 mg total) by mouth daily.  Marland Kitchen umeclidinium bromide (INCRUSE ELLIPTA) 62.5 MCG/INH AEPB Inhale 1 puff into the lungs daily.  . furosemide (LASIX) 20 MG tablet Take 1 tablet (20 mg total) by mouth 2 (two) times daily. (Patient not taking: Reported on 02/24/2016)  . Nutritional Supplements (FEEDING SUPPLEMENT, VITAL AF 1.2 CAL,) LIQD Place 1,000 mLs into feeding tube continuous.  . potassium chloride (K-DUR,KLOR-CON) 10 MEQ tablet Take 1 tablet (10 mEq total) by mouth 2 (two) times daily. (Patient not taking: Reported on 03/12/2016)    FAMILY HISTORY:  His indicated that his mother is deceased. He indicated that his father is deceased. He indicated that only one of his two brothers is alive. He indicated that the status of his maternal grandmother is unknown.    SOCIAL HISTORY: He  reports that he has been smoking Cigarettes.  He has a 100.00 pack-year smoking history. He has quit using smokeless tobacco. He reports that he drinks about 17.5 oz of alcohol per week . He reports that he does not use drugs.  REVIEW OF SYSTEMS:   Unable to complete with patient due to mechanical ventilation.   SUBJECTIVE:  On vent but somewhat anxious / agitated.  VITAL SIGNS: BP 113/62   Pulse (!) 25   Temp (!) 96.7 F (35.9 C) (Oral)   Resp 18   Ht 5\' 10"  (1.778 m)   Wt 163 lb 5.8 oz (74.1 kg)   SpO2 92%   BMI 23.44 kg/m   HEMODYNAMICS:    VENTILATOR SETTINGS: FiO2 (%):  [100 %] 100 %  INTAKE / OUTPUT: I/O last 3 completed shifts: In: 640 [P.O.:240; IV Piggyback:400] Out: 450 [Urine:450]  PHYSICAL EXAMINATION: General: Chronically ill appearing male, resting in bed, in NAD. Neuro: Sedated but slightly agitated. Does not follow commands. HEENT: [place]/AT. PERRL, sclerae anicteric. Cardiovascular: RRR, no M/R/G.  Lungs: Respirations even and  unlabored.  CTA bilaterally, No W/R/R. Abdomen: BS x 4, soft, NT/ND.  Musculoskeletal: No gross deformities, no edema.  Skin: Dry, intact, warm, no rashes.   LABS:  BMET  Recent Labs Lab 03/02/16 0446 03/04/16 0405 03/04/16 1120  NA 125* 122* 121*  K 4.9 6.1* 6.3*  CL 99* 95* 94*  CO2 19* 16* 18*  BUN 23* 37* 39*  CREATININE 1.05 2.03* 2.38*  GLUCOSE 105* 110* 109*    Electrolytes  Recent Labs Lab 03/02/16 0446 03/04/16 0405 03/04/16 1120  CALCIUM 8.8* 8.9 8.9  MG  --   --  2.1    CBC  Recent Labs Lab 03/02/16 0446 03/04/16 0405 03/04/16 1120  WBC 10.3 13.5* 14.9*  HGB 9.1* 9.7* 9.7*  HCT 28.3* 31.1* 30.8*  PLT 335 344 322    Coag's No results for input(s): APTT, INR in the last 168 hours.  Sepsis Markers  Recent Labs Lab 02/18/2016 2307 02/29/16 0944 02/29/16 1247  LATICACIDVEN 3.1* 1.9 2.0*    ABG  Recent Labs Lab 02/27/2016 1720 03/04/16 1155  PHART 7.369 7.245*  PCO2ART 27.5* 29.0*  PO2ART  57.9* 90.6    Liver Enzymes  Recent Labs Lab 03/15/2016 1705 02/29/16 0545 03/01/16 0452  AST 32 33 48*  ALT 33 45 72*  ALKPHOS 95 81 86  BILITOT 1.6* 1.3* 1.2  ALBUMIN 3.7 3.1* 3.1*    Cardiac Enzymes No results for input(s): TROPONINI, PROBNP in the last 168 hours.  Glucose No results for input(s): GLUCAP in the last 168 hours.  Imaging Dg Chest Port 1 View  Result Date: 03/04/2016 CLINICAL DATA:  Intubation EXAM: PORTABLE CHEST 1 VIEW COMPARISON:  Portable exam 1243 hours compared 03/04/2016 FINDINGS: Tip of endotracheal tube projects 3.9 cm above carina. Nasogastric tube extends into stomach. External pacing leads project over chest. Enlargement of cardiac silhouette with pulmonary vascular congestion. Persistent pulmonary infiltrates question edema and layered RIGHT pleural effusion. Atelectasis at LEFT base. No pneumothorax. Atherosclerotic calcification aorta. IMPRESSION: Tube positions as above. Probable mild pulmonary edema with  RIGHT pleural effusion and LEFT basilar atelectasis. Electronically Signed   By: Lavonia Dana M.D.   On: 03/04/2016 13:00   Dg Chest Port 1 View  Result Date: 03/04/2016 CLINICAL DATA:  Cardiomyopathy.  Congestive heart failure EXAM: PORTABLE CHEST 1 VIEW COMPARISON:  March 01, 2016 FINDINGS: There is persistent interstitial pulmonary edema with airspace consolidation in the lateral left perihilar region which is stable. There is a right pleural effusion which appears larger than on the prior study. There is cardiomegaly with pulmonary venous hypertension. There is persistent left base atelectasis. There is atherosclerotic calcification in the aorta. IMPRESSION: Findings consistent with a degree of congestive heart failure with slightly larger right pleural effusion. Suspect superimposed pneumonia left upper lobe/left perihilar region. Stable atelectasis left base. Cardiac silhouette unchanged. There is aortic atherosclerosis. Electronically Signed   By: Lowella Grip III M.D.   On: 03/04/2016 08:45    STUDIES:  CXR 10/16 > mild pulmonary edema with right pleural effusion and left basilar atx.  CULTURES: Blood 10/11 > Sputum 10/16 >   ANTIBIOTICS: Vanc 10/11 > 10/16 Cefepime 10/11 >  SIGNIFICANT EVENTS: 10/11 > admitted to [facility]. 10/16 > transferred to [facility].  LINES/TUBES: ETT 10/16 >  DISCUSSION: 80 y.o. male with multiple co-morbidities, admitted by [facility] 03/06/2016 with SOB suspected due to HCAP vs pulmonary edema. On 10/16, developed worsening hypoxia along with profound bradycardia requiring atropine, TC pacing, dopamine.  Required intubation due to respiratory distress and AMS.  Later transferred to [facility] for further evaluation and management.  ASSESSMENT / PLAN:  PULMONARY A: Acute hypoxic respiratory failure - s/p emergent intubation 03/04/16. HCAP. Pulmonary edema. Pleural effusions. Hx pulmonary fibrosis, COPD - followed by Dr. Melvyn Novas. Tobacco dependence. P:   Full vent  support. ABG and CXR now. Wean as able. VAP prevention measures. SBT in AM if able. Continue empiric cefepime (vanc stopped 10/16). Budesonide in lieu of preadmission Breo. DuoNebs scheduled / Albuterol PRN. CXR in AM. Tobacco cessation counseling once extubated.  CARDIOVASCULAR A:  Sinus bradycardia with pauses - s/p atropine and external pacing followed by dobutamine. Hx sCHF (echo from August 2017 with EF 25-30%), chronic a.fib (not on anticoagulation due to hx falls), HTN, HLD, CAD. P:  Continue dobutamine. Will notify cardiology of transfer (was being followed by cardiology at [facility]), appreciate the assistance. Repeat troponin, lactate. Assess echo. Hold preadmission amio, furosemide.  RENAL A:   Hyponatremia - presumed hypervolemic in setting CHF. Hyperkalemia - s/p kayexalate, insulin, dextrose, calcium, bicarb. NAGMA. AKI. P:   Continue HCO3 gtt for now. Assess osmolality. BMP q4hrs x 4.  Correct electrolytes as indicated.  GASTROINTESTINAL A:   GI prophylaxis. Nutrition. P:   SUP: Pantoprazole. NPO.  HEMATOLOGIC A:   VTE Prophylaxis. P:  SCD's / heparin. CBC in AM.  INFECTIOUS A:   HCAP. P:   Abx as above (Continue empiric cefepime (vanc stopped 10/16)).  Send sputum culture. PCT algorithm to limit abx exposure.  ENDOCRINE A:   No acute issues. P:   Monitor glucose on BMP.  NEUROLOGIC A:   Acute encephalopathy. Hx ETOH abuse. P:   Sedation:  Fentanyl gtt / Midazolam PRN. RASS goal: 0 to -1. Daily WUA. Thiamine / Folate.  Hold preadmission mirtazapine, quetiapine, sertraline. D/c zolpidem.  Family updated: None available.  Interdisciplinary Family Meeting v Palliative Care Meeting:  Due by: 03/11/16.  CC time: 40 minutes.   Montey Hora, [place] [facility] Pager: [phone]  or [phone] 03/04/2016, 7:50 PM   STAFF NOTE: I, Merrie Roof, MD FACP have personally  reviewed patient's available data, including medical history, events of note, physical examination and test results as part of my evaluation. I have discussed with resident/NP and other care providers such as pharmacist, RN and RRT. In addition, I personally evaluated patient and elicited key findings of: sedated, BP wnl, left eye conjunctivitis, dry skin, mild edema legs, pcxr c/w pulm edema, no fevers, no pressors, wbc wnl, clinical picture of pulm edema CHF, ABG reviewed, keep same MV, on Low O2, need neg balance prior to successful weaning, NONAG mostly now resolve, consider bicarb dc also k wnl now, not titration dobutamine to any parameter, add lasix to neg 2 liters goals, may need cvp assessment, if drops BP could add cvp line, pcxr in am, WUA, avoid benzo as able, chem q8h, pct neg, culture neg, clinical circumstance low for active infection, dc all abx and observe, no family here, add conjunctivitis eye treatment The patient is critically ill with multiple organ systems failure and requires high complexity decision making for assessment and support, frequent evaluation and titration of therapies, application of advanced monitoring technologies and extensive interpretation of multiple databases.   Critical Care Time devoted to patient care services described in this note is 35 Minutes. This time reflects time of care of this signee: Merrie Roof, MD FACP. This critical care time does not reflect procedure time, or teaching time or supervisory time of PA/NP/Med student/Med Resident etc but could involve care discussion time. Rest per NP/medical resident whose note is outlined above and that I agree with   Lavon Paganini. Titus Mould, MD, [place] Pgr: [facility] 03/04/2016 9:37 PM

## 2016-03-04 NOTE — Progress Notes (Signed)
[facility] is now tansporting after having to get a new ABG and vitals

## 2016-03-04 NOTE — Anesthesia Preprocedure Evaluation (Signed)
Anesthesia Evaluation  Patient identified by MRN, date of birth, ID band Patient unresponsive  Preop documentation limited or incomplete due to emergent nature of procedure.  Airway       Comment: Edentulous.  Old tracheal stoma is present.  Uncooperative with exam. Dental   Pulmonary Current Smoker,           Cardiovascular hypertension,   Externally paced.  Had episode of asystole/brady today.   Neuro/Psych    GI/Hepatic   Endo/Other    Renal/GU      Musculoskeletal   Abdominal   Peds  Hematology   Anesthesia Other Findings   Reproductive/Obstetrics                             Anesthesia Physical Anesthesia Plan  ASA: IV and emergent  Anesthesia Plan:    Post-op Pain Management:    Induction:   Airway Management Planned:   Additional Equipment:   Intra-op Plan:   Post-operative Plan:   Informed Consent:   Plan Discussed with:   Anesthesia Plan Comments:         Anesthesia Quick Evaluation

## 2016-03-04 NOTE — Progress Notes (Signed)
**Note De-Identified  Obfuscation** EKG complete and reported to RN

## 2016-03-05 ENCOUNTER — Inpatient Hospital Stay (HOSPITAL_COMMUNITY): Payer: Medicare Other

## 2016-03-05 DIAGNOSIS — J9601 Acute respiratory failure with hypoxia: Secondary | ICD-10-CM

## 2016-03-05 DIAGNOSIS — E871 Hypo-osmolality and hyponatremia: Secondary | ICD-10-CM

## 2016-03-05 DIAGNOSIS — I5041 Acute combined systolic (congestive) and diastolic (congestive) heart failure: Secondary | ICD-10-CM

## 2016-03-05 DIAGNOSIS — J810 Acute pulmonary edema: Secondary | ICD-10-CM

## 2016-03-05 DIAGNOSIS — R001 Bradycardia, unspecified: Secondary | ICD-10-CM

## 2016-03-05 LAB — BASIC METABOLIC PANEL
ANION GAP: 9 (ref 5–15)
ANION GAP: 11 (ref 5–15)
BUN: 32 mg/dL — AB (ref 6–20)
BUN: 32 mg/dL — ABNORMAL HIGH (ref 6–20)
CALCIUM: 8.8 mg/dL — AB (ref 8.9–10.3)
CHLORIDE: 96 mmol/L — AB (ref 101–111)
CO2: 24 mmol/L (ref 22–32)
CO2: 23 mmol/L (ref 22–32)
CREATININE: 1.83 mg/dL — AB (ref 0.61–1.24)
Calcium: 8.7 mg/dL — ABNORMAL LOW (ref 8.9–10.3)
Chloride: 96 mmol/L — ABNORMAL LOW (ref 101–111)
Creatinine, Ser: 1.97 mg/dL — ABNORMAL HIGH (ref 0.61–1.24)
GFR calc Af Amer: 36 mL/min — ABNORMAL LOW (ref >60)
GFR calc non Af Amer: 31 mL/min — ABNORMAL LOW (ref >60)
GFR, EST AFRICAN AMERICAN: 39 mL/min — AB (ref >60)
GFR, EST NON AFRICAN AMERICAN: 34 mL/min — AB (ref >60)
GLUCOSE: 84 mg/dL (ref 65–99)
GLUCOSE: 71 mg/dL (ref 65–99)
POTASSIUM: 4.2 mmol/L (ref 3.5–5.1)
Potassium: 3.7 mmol/L (ref 3.5–5.1)
Sodium: 129 mmol/L — ABNORMAL LOW (ref 135–145)
Sodium: 130 mmol/L — ABNORMAL LOW (ref 135–145)

## 2016-03-05 LAB — ECHOCARDIOGRAM LIMITED
FS: 13 % — AB (ref 28–44)
HEIGHTINCHES: 70 in
IVS/LV PW RATIO, ED: 0.68
LA diam end sys: 31 mm
LA diam index: 1.62 cm/m2
LASIZE: 31 mm
LAVOL: 96.5 mL
LAVOLA4C: 91.6 mL
LAVOLIN: 50.5 mL/m2
LVOT area: 3.8 cm2
LVOT diameter: 22 mm
PW: 13.4 mm — AB (ref 0.6–1.1)
TDI e' medial: 4.43
WEIGHTICAEL: 2589.08 [oz_av]

## 2016-03-05 LAB — BLOOD GAS, ARTERIAL
Acid-base deficit: 2.5 mmol/L — ABNORMAL HIGH (ref 0.0–2.0)
Bicarbonate: 21.8 mmol/L (ref 20.0–28.0)
Drawn by: 345601
FIO2: 40
LHR: 16 {breaths}/min
MECHVT: 580 mL
O2 SAT: 99.1 %
PATIENT TEMPERATURE: 97.7
PCO2 ART: 36.6 mmHg (ref 32.0–48.0)
PEEP: 5 cmH2O
PO2 ART: 151 mmHg — AB (ref 83.0–108.0)
pH, Arterial: 7.39 (ref 7.350–7.450)

## 2016-03-05 LAB — COOXEMETRY PANEL
Carboxyhemoglobin: 1.1 % (ref 0.5–1.5)
METHEMOGLOBIN: 0.8 % (ref 0.0–1.5)
O2 SAT: 74.8 %
TOTAL HEMOGLOBIN: 15.2 g/dL (ref 12.0–16.0)

## 2016-03-05 LAB — GLUCOSE, CAPILLARY
GLUCOSE-CAPILLARY: 104 mg/dL — AB (ref 65–99)
GLUCOSE-CAPILLARY: 87 mg/dL (ref 65–99)
Glucose-Capillary: 144 mg/dL — ABNORMAL HIGH (ref 65–99)
Glucose-Capillary: 74 mg/dL (ref 65–99)
Glucose-Capillary: 86 mg/dL (ref 65–99)

## 2016-03-05 LAB — MAGNESIUM
MAGNESIUM: 1.6 mg/dL — AB (ref 1.7–2.4)
MAGNESIUM: 1.7 mg/dL (ref 1.7–2.4)

## 2016-03-05 LAB — CBC
HEMATOCRIT: 28.4 % — AB (ref 39.0–52.0)
HEMOGLOBIN: 9.2 g/dL — AB (ref 13.0–17.0)
MCH: 32.1 pg (ref 26.0–34.0)
MCHC: 32.4 g/dL (ref 30.0–36.0)
MCV: 99 fL (ref 78.0–100.0)
Platelets: 246 10*3/uL (ref 150–400)
RBC: 2.87 MIL/uL — ABNORMAL LOW (ref 4.22–5.81)
RDW: 18.3 % — AB (ref 11.5–15.5)
WBC: 9.2 10*3/uL (ref 4.0–10.5)

## 2016-03-05 LAB — PROCALCITONIN: PROCALCITONIN: 0.32 ng/mL

## 2016-03-05 LAB — PHOSPHORUS
Phosphorus: 3.9 mg/dL (ref 2.5–4.6)
Phosphorus: 3.5 mg/dL (ref 2.5–4.6)

## 2016-03-05 MED ORDER — VITAL HIGH PROTEIN PO LIQD: 1000.0000 mL | ORAL | Status: DC

## 2016-03-05 MED ORDER — SODIUM CHLORIDE 0.9% FLUSH: 10.0000 mL | INTRAVENOUS | Status: DC | PRN

## 2016-03-05 MED ORDER — DOBUTAMINE IN D5W 4-5 MG/ML-% IV SOLN
2.5000 ug/kg/min | INTRAVENOUS | Status: DC
  Administered 2016-03-06: 10 ug/kg/min via INTRAVENOUS
  Administered 2016-03-06: 17.5 ug/kg/min via INTRAVENOUS
  Administered 2016-03-07 – 2016-03-08 (×2): 2.5 ug/kg/min via INTRAVENOUS
  Filled 2016-03-05 (×2): qty 250

## 2016-03-05 MED ORDER — SODIUM CHLORIDE 0.9% FLUSH
10.0000 mL | Freq: Two times a day (BID) | INTRAVENOUS | Status: DC
  Administered 2016-03-05 – 2016-03-08 (×6): 10 mL
  Administered 2016-03-09: 20 mL
  Administered 2016-03-09 – 2016-03-12 (×5): 10 mL
  Administered 2016-03-12: 20 mL
  Administered 2016-03-12 – 2016-03-16 (×6): 10 mL

## 2016-03-05 MED ORDER — MAGNESIUM SULFATE IN D5W 1-5 GM/100ML-% IV SOLN
1.0000 g | Freq: Once | INTRAVENOUS | Status: AC
  Administered 2016-03-05: 1 g via INTRAVENOUS
  Filled 2016-03-05: qty 100

## 2016-03-05 MED ORDER — PRO-STAT SUGAR FREE PO LIQD
30.0000 mL | Freq: Two times a day (BID) | ORAL | Status: DC
  Administered 2016-03-05 – 2016-03-11 (×12): 30 mL
  Filled 2016-03-05 (×13): qty 30

## 2016-03-05 MED ORDER — DEXTROSE 5 % IV SOLN
2.0000 g | INTRAVENOUS | Status: DC
  Administered 2016-03-05 – 2016-03-06 (×2): 2 g via INTRAVENOUS
  Filled 2016-03-05 (×3): qty 2

## 2016-03-05 MED ORDER — DOCUSATE SODIUM 50 MG/5ML PO LIQD
100.0000 mg | Freq: Every day | ORAL | Status: DC
  Administered 2016-03-05 – 2016-03-11 (×7): 100 mg
  Filled 2016-03-05 (×8): qty 10

## 2016-03-05 MED ORDER — VITAL AF 1.2 CAL PO LIQD
1000.0000 mL | ORAL | Status: DC
  Administered 2016-03-05 – 2016-03-07 (×3): 1000 mL

## 2016-03-05 MED FILL — Medication: Qty: 1 | Status: AC

## 2016-03-05 NOTE — H&P (Signed)
PULMONARY / CRITICAL CARE MEDICINE   Name: Chad Rogers MRN: [id number] DOB: 1936-04-18    ADMISSION DATE:  02/23/2016 CONSULTATION DATE:  03/04/16  REFERRING MD:  03/04/16  CHIEF COMPLAINT:  Dr. Wyline Copas   HISTORY OF PRESENT ILLNESS:   80 y/o M, smoker, with PMH of ETOH abuse, allergic rhinitis, psoriasis, skin cancer, osteoarthritis, CAD, HTN, atrial fibrillation (not on anticoagulation, Hx falls), systolic CHF (EF [id number]), HLD, pulmonary fibrosis and COPD (previously followed by Dr. Melvyn Novas - 2011) admitted to [facility] on 02/27/2016 complaints of SOB.    The patient recently had a prolonged hospitalization after a fall with several rib fractures.  During that admit, he had a prolonged intubation requiring tracheostomy and admission to an LTAC followed by rehab.  The patient was apparently discharged one week prior to presentation.  He reported nausea, vomiting, weakness initially that resolved.  Subsequently, he developed worsening shortness of breath & DOE.  On admit, he was hypotensive, hypoxic and had an elevated lactic acid (7).  Work up concerning for new infiltrate & volume depletion with AKI.  The patient was admitted per [facility] for further care.  He responded well to IVF's and oxygen initially.  He was later found to have pulmonary edema with associated effusions and treated with Lasix.  Follow up CXR evaluation showed enlarging effusions.  Cardiology was consulted for evaluation.  During their evaluation, he was found to have pronounced bradycardia and pauses.  Atropine was administered and he was started on an external pacemaker.  HR was labile and he was started on dopamine.  He had subsequent respiratory distress with hypoxemia (sats dropped to 70's) with altered level of consciousness and required intubation.  He developed further metabolic derangements ([id number], CO2 18, Hgb 9.7) 10/16 and was transferred to [facility] for further care.    Current labs - 7.245 / 29 / 90 / 13.7.  Na 121, K  6.3, Cl 94, CO2 18, BUN 39 / Sr Cr 2.38, glucose 109, Mg 2.1, WBC 14.9, Hgb 9.7, MCV 102.7 and platelets 322.  CXR 10/16 with concern for pulmonary edema, layering effusions and left basilar atelectasis.  SUBJECTIVE:   On vent but somewhat anxious / agitated. No issues overnight.   VITAL SIGNS: BP 100/66 (BP Location: Left Arm)   Pulse 100   Temp 97.9 F (36.6 C) (Oral)   Resp 16   Ht 5\' 10"  (1.778 m)   Wt 73.4 kg (161 lb 13.1 oz)   SpO2 100%   BMI 23.22 kg/m   HEMODYNAMICS:    VENTILATOR SETTINGS: Vent Mode: PRVC FiO2 (%):  [40 %-100 %] 40 % Set Rate:  [16 bmp] 16 bmp Vt Set:  [580 mL] 580 mL PEEP:  [5 cmH20] 5 cmH20 Plateau Pressure:  [12 cmH20-20 cmH20] 18 cmH20  INTAKE / OUTPUT: I/O last 3 completed shifts: In: 641.5 [I.V.:641.5] Out: [id number] [Urine:3225; Emesis/NG output:300]  PHYSICAL EXAMINATION: General: Chronically ill appearing male, resting in bed, in NAD. Neuro: Sedated but slightly agitated. Follows commands.  HEENT: [place]/AT. PERRL, sclerae anicteric. Cardiovascular: RRR, no M/R/G.  Lungs: Respirations even and unlabored. Bibasilar crackles.  Abdomen: BS x 4, soft, NT/ND.  Musculoskeletal: No gross deformities, no edema.  Skin: Dry, intact, warm, no rashes.   LABS:  BMET  Recent Labs Lab 03/04/16 2006 03/05/16 0327 03/05/16 0757  NA 125* 129* 130*  K 4.4 4.2 3.7  CL 91* 96* 96*  CO2 25 24 23   BUN 33* 32* 32*  CREATININE 2.05* 1.97* 1.83*  GLUCOSE 325* 84 71    Electrolytes  Recent Labs Lab 03/04/16 1120  03/04/16 2006 03/05/16 0327 03/05/16 0757  CALCIUM 8.9  < > 8.4* 8.7* 8.8*  MG 2.1  --   --  1.6*  --   PHOS  --   --   --  3.9  --   < > = values in this interval not displayed.  CBC  Recent Labs Lab 03/04/16 1120 03/04/16 2006 03/05/16 0327  WBC 14.9* 9.7 9.2  HGB 9.7* 8.3* 9.2*  HCT 30.8* 25.4* 28.4*  PLT 322 236 246    Coag's No results for input(s): APTT, INR in the last 168 hours.  Sepsis Markers  Recent  Labs Lab 02/29/16 0944 02/29/16 1247 03/04/16 2006 03/05/16 0327  LATICACIDVEN 1.9 2.0* 1.5  --   PROCALCITON  --   --  0.26 0.32    ABG  Recent Labs Lab 03/04/16 1815 03/04/16 2119 03/05/16 0458  PHART 7.363 7.356 7.390  PCO2ART 33.3 35.2 36.6  PO2ART 176* 177.0* 151*    Liver Enzymes  Recent Labs Lab 02/29/16 0545 03/01/16 0452 03/04/16 2006  AST 33 48* 507*  ALT 45 72* 647*  ALKPHOS 81 86 119  BILITOT 1.3* 1.2 2.1*  ALBUMIN 3.1* 3.1* 2.6*    Cardiac Enzymes  Recent Labs Lab 03/04/16 2006  TROPONINI 0.05*    Glucose  Recent Labs Lab 03/04/16 1538  GLUCAP 100*    Imaging Dg Chest Port 1 View  Result Date: 03/05/2016 CLINICAL DATA:  Respiratory failure. EXAM: PORTABLE CHEST 1 VIEW COMPARISON:  03/04/2016. FINDINGS: Endotracheal tube and NG tube are in stable position. Cardiomegaly with bilateral pulmonary edema again noted. Slight improvement. Bilateral pleural effusions again noted. No change. No pneumothorax. IMPRESSION: 1. Lines and tubes in stable position. 2. Cardiomegaly with bilateral pulmonary edema, improved from prior exams. Bilateral pleural effusions are unchanged. Electronically Signed   By: Marcello Moores  Register   On: 03/05/2016 07:06   Dg Chest Port 1 View  Result Date: 03/05/2016 CLINICAL DATA:  Check endotracheal tube placement EXAM: PORTABLE CHEST 1 VIEW COMPARISON:  03/04/2016 FINDINGS: Cardiac shadow remains enlarged. Endotracheal tube is noted approximately 4.4 cm above the carina. Nasogastric catheter is seen within the stomach. Right-sided pleural effusion and left retrocardiac consolidation are again seen. Multiple rib fractures are noted on the left. No pneumothorax is noted. IMPRESSION: Tubes and lines as described above. Bibasilar changes similar to that seen on the prior exam. Multiple left rib fractures Electronically Signed   By: Inez Catalina M.D.   On: 03/05/2016 07:54   Dg Chest Port 1 View  Result Date: 03/04/2016 CLINICAL  DATA:  Intubation EXAM: PORTABLE CHEST 1 VIEW COMPARISON:  Portable exam 1243 hours compared 03/04/2016 FINDINGS: Tip of endotracheal tube projects 3.9 cm above carina. Nasogastric tube extends into stomach. External pacing leads project over chest. Enlargement of cardiac silhouette with pulmonary vascular congestion. Persistent pulmonary infiltrates question edema and layered RIGHT pleural effusion. Atelectasis at LEFT base. No pneumothorax. Atherosclerotic calcification aorta. IMPRESSION: Tube positions as above. Probable mild pulmonary edema with RIGHT pleural effusion and LEFT basilar atelectasis. Electronically Signed   By: Lavonia Dana M.D.   On: 03/04/2016 13:00    STUDIES:  CXR 10/16 > mild pulmonary edema with right pleural effusion and left basilar atx.  CULTURES: Blood 10/11 > Sputum 10/16 >   ANTIBIOTICS: Vanc 10/11 > 10/16 Cefepime 10/11 >  SIGNIFICANT EVENTS: 10/11 > admitted to [facility]. 10/16 > transferred to [facility].  LINES/TUBES:  ETT 10/16 >  DISCUSSION: 80 y.o. male with multiple co-morbidities, admitted by [facility] 02/22/2016 with SOB suspected due to HCAP vs pulmonary edema. On 10/16, developed worsening hypoxia along with profound bradycardia requiring atropine, TC pacing, dopamine.  Required intubation due to respiratory distress and AMS.  Later transferred to [facility] for further evaluation and management.  ASSESSMENT / PLAN:  PULMONARY A: Acute hypoxic respiratory failure - s/p emergent intubation 03/04/16 for AMS, likely related to bradycardia HCAP. Pulmonary edema. Pleural effusions. Hx pulmonary fibrosis, COPD - followed by Dr. Melvyn Novas. Tobacco dependence. P:   Full vent support. Chest CT scan to better delineate parenchyma. CXR has B effusion and RML/RLL infiltrate.  Wean as able. VAP prevention measures. SBT in AM if able. Continue empiric cefepime (vanc stopped 10/16). Budesonide in lieu of preadmission Breo. DuoNebs scheduled / Albuterol PRN. Tobacco cessation  counseling once extubated.  CARDIOVASCULAR A:  Sinus bradycardia with pauses - s/p atropine and external pacing followed by dobutamine. Hx sCHF (echo from August 2017 with EF 25-30%), chronic a.fib (not on anticoagulation due to hx falls), HTN, HLD, CAD. P:  Not on dobutamine Cardiology aware of admission.  Assess echo. Cont lasix per Cards.   RENAL A:   Hyponatremia - presumed hypervolemic in setting CHF. Hyperkalemia - s/p kayexalate, insulin, dextrose, calcium, bicarb. Better.  NAGMA. AKI. P:   Off HCO3 drip.  Assess osmolality. Correct electrolytes as indicated.  GASTROINTESTINAL A:   GI prophylaxis. Nutrition. Transaminitis likely from [facility] 2/2 CHF P:   SUP: Pantoprazole. Start TF Check LFts in am  HEMATOLOGIC A:   VTE Prophylaxis. P:  SCD's / heparin. CBC in AM.  INFECTIOUS A:   HCAP. P:   Abx as above (Continue empiric cefepime (vanc stopped 10/16)).  Send sputum culture. PCT algorithm to limit abx exposure.  ENDOCRINE A:   No acute issues. P:   Monitor glucose on BMP.  NEUROLOGIC A:   Acute encephalopathy. Hx ETOH abuse. P:   Sedation:  Fentanyl gtt / Midazolam PRN. RASS goal: 0 to -1. Daily WUA. Thiamine / Folate.  Hold preadmission mirtazapine, quetiapine, sertraline. D/c zolpidem.  Family updated: Granddaughter at bedside and updated.   Interdisciplinary Family Meeting v Palliative Care Meeting:  Due by: 03/11/16.  Critical Care time with this patient today: 30 minutes.  Monica Becton, MD 03/05/2016, 11:14 AM [place] [facility] Pager [phone] After 3 pm or if no answer, call [phone]

## 2016-03-05 NOTE — Progress Notes (Signed)
  Echocardiogram 2D Echocardiogram limited has been performed. Unable to obtain images in the parasternal view and suprasternal notch view.  Chad Rogers 03/05/2016, 1:10 PM

## 2016-03-05 NOTE — Progress Notes (Signed)
Nutrition Follow-up  DOCUMENTATION CODES:   Severe malnutrition in context of acute illness/injury  INTERVENTION:   Volume-based feeding:  Vital AF 1.2 @ 50 ml/hr (1200 ml/day) 30 ml Prostat BID Provides: 1640 kcal, 120 grams protein, and 973 ml H2O.  MVI daily  NUTRITION DIAGNOSIS:   Malnutrition (Severe) related to acute illness as evidenced by moderate depletion of body fat, moderate depletions of muscle mass. Ongoing.   GOAL:   Patient will meet greater than or equal to 90% of their needs Progressing.   MONITOR:   TF tolerance, Skin, I & O's, Labs  REASON FOR ASSESSMENT:   Consult Enteral/tube feeding initiation and management  ASSESSMENT:   Pt with hx of ETOH abuse and recent prolonged hospitalization after a fall with several rib fractures.  During that admit, he had a prolonged intubation requiring tracheostomy and admission to an LTAC followed by rehab.  The patient was apparently discharged one week prior to presentation.  He reported nausea, vomiting, weakness initially that resolved.  Subsequently, he developed worsening shortness of breath.  Pt transferred to [facility], he was hypotensive, hypoxic and had an elevated lactic acid (7).  Work up concerning for new infiltrate & volume depletion with AKI.    Admission weight in 8/17 was 165 lb During stay at [facility] po intake 25-75% with Ensure Enlive ordered BID  Patient is currently intubated on ventilator support MV: 9.3 L/min Temp (24hrs), Avg:97.5 F (36.4 C), Min:96.2 F (35.7 C), Max:98.1 F (36.7 C)  Medications reviewed and include: colace, lasix, MVI, thiamine, folvite Labs reviewed: Na 130, magnesium 1.6, PO 4: WNL Pt now - 3 L since admission OG tube  Diet Order:  Diet NPO time specified  Skin:  Wound (see comment) (Stage I sacrum)  Last BM:  10/10  Height:   Ht Readings from Last 1 Encounters:  03/04/16 5\' 10"  (1.778 m)    Weight:   Wt Readings from Last 1 Encounters:  03/04/16 161 lb 13.1  oz (73.4 kg)    Ideal Body Weight:  75 kg  BMI:  Body mass index is 23.22 kg/m.  Estimated Nutritional Needs:   Kcal:  1612  Protein:  105-115 grams  Fluid:  1.6 L/day  EDUCATION NEEDS:   No education needs identified at this time  [place], [place], [place] Pager [phone] After Hours Pager

## 2016-03-05 NOTE — Progress Notes (Signed)
Pharmacy Antibiotic Note  Chad Rogers is a 80 y.o. male admitted on 02/27/2016 with pneumonia.  Pharmacy has been consulted for cefepime dosing.  Plan: Restart cefepime at 2g IV q24h Monitor c/s, clinical progression, renal function  Height: 5\' 10"  (177.8 cm) Weight: 161 lb 13.1 oz (73.4 kg) IBW/kg (Calculated) : 73  Temp (24hrs), Avg:97.4 F (36.3 C), Min:96.2 F (35.7 C), Max:98.1 F (36.7 C)   Recent Labs Lab 03/03/2016 1641  03/07/2016 2307  02/29/16 0944 02/29/16 1247  03/02/16 0446 03/04/16 0405 03/04/16 1120 03/04/16 1656 03/04/16 2006 03/05/16 0327 03/05/16 0757  WBC  --   < >  --   < >  --   --   < > 10.3 13.5* 14.9*  --  9.7 9.2  --   CREATININE  --   < >  --   < >  --   --   < > 1.05 2.03* 2.38* 2.15* 2.05* 1.97* 1.83*  LATICACIDVEN 7.37*  --  3.1*  --  1.9 2.0*  --   --   --   --   --  1.5  --   --   < > = values in this interval not displayed.  Estimated Creatinine Clearance: 34.4 mL/min (by C-G formula based on SCr of 1.83 mg/dL (H)).    No Known Allergies  Antimicrobials this admission:  Vancomycin 10/11>>10/16 Cefepime 10/11>>10/15; 10/17>>  Dose adjustments this admission: Cefepime has required adjustment to schedule d/t changes in renal function during admission (10/12-10/14- on 1g q24; 10/14-10/16- on 1g IV q12h; 10/17- restarted at q24h)  Microbiology results: 10/11BCx: neg 10/11UCx: neg 10/16 TA: pending  Thank you for allowing pharmacy to be a part of this patient's care.  Floyce Bujak D. Ishika [place], PharmD, BCPS Clinical Pharmacist Pager: [phone] 03/05/2016 1:35 PM

## 2016-03-05 NOTE — Progress Notes (Signed)
Patient Name: Chad Rogers Date of Encounter: 03/05/2016  Primary Cardiologist: [facility] Problem List     Principal Problem:   Sepsis [facility]) Active Problems:   Hypertension   Pulmonary fibrosis ([place])   Tobacco abuse   Traumatic fracture of ribs with pneumothorax   Multiple rib fractures   Chronic systolic CHF (congestive heart failure) (HCC)   Atrial fibrillation, persistent (HCC)   Debility   Healthcare-associated pneumonia   Protein-calorie malnutrition, severe   Metabolic acidosis   CHF (congestive heart failure) (HCC)   Endotracheally intubated   Acute encephalopathy   AKI (acute kidney injury) ([place])   Hyperkalemia   Pressure injury of skin     Subjective   Intubated and awake, but unable to talk or write.  Denies pain.   Inpatient Medications    Scheduled Meds: . budesonide (PULMICORT) nebulizer solution  0.5 mg Nebulization BID  . chlorhexidine gluconate (MEDLINE KIT)  15 mL Mouth Rinse BID  . docusate sodium  100 mg Oral BID  . folic acid  1 mg Per Tube Daily  . furosemide  80 mg Intravenous Q8H  . heparin  5,000 Units Subcutaneous Q8H  . hydrocortisone cream   Topical BID  . ipratropium-albuterol  3 mL Nebulization Q6H  . mouth rinse  15 mL Mouth Rinse 10 times per day  . multivitamin with minerals  1 tablet Per Tube Daily  . pantoprazole sodium  40 mg Per Tube Q1200  . thiamine  100 mg Per Tube Daily   Continuous Infusions: . DOBUTamine Stopped (03/04/16 2332)  . fentaNYL infusion INTRAVENOUS 75 mcg/hr (03/05/16 0906)   PRN Meds: albuterol, fentaNYL, midazolam, midazolam   Vital Signs    Vitals:   03/05/16 0906 03/05/16 0930 03/05/16 1000 03/05/16 1100  BP:  (!) 86/55 92/61 100/66  Pulse: 100 100 99 100  Resp: 17 16 16 16   Temp:      TempSrc:      SpO2: 100% 100% 100% 100%  Weight:      Height:        Intake/Output Summary (Last 24 hours) at 03/05/16 1102 Last data filed at 03/05/16 0900  Gross per 24 hour  Intake            651.19 ml  Output             4375 ml  Net         -3723.81 ml   Filed Weights   03/01/16 0400 03/02/16 0400 03/04/16 2000  Weight: 70 kg (154 lb 5.2 oz) 74.1 kg (163 lb 5.8 oz) 73.4 kg (161 lb 13.1 oz)    Physical Exam   GEN: Chronically ill-appearing and frail.  No acute distress.  HEENT: Grossly normal.  Neck: Supple, JVD 2 cm above clavicle at 45 degrees.   No carotid bruits, or masses. Cardiac: Tachycardic, irregularly irregular rhythm, no murmurs, rubs, or gallops. No clubbing, cyanosis, edema.  Radials/DP/PT 2+ and equal bilaterally.  Respiratory:  Vented breath sounds  GI: Soft, nontender, nondistended, BS + x 4. MS: no deformity or atrophy. Skin: warm and dry, no rash. Neuro:  Strength and sensation are intact. Psych: AAOx3.  Normal affect.  Labs    CBC  Recent Labs  03/04/16 1120 03/04/16 2006 03/05/16 0327  WBC 14.9* 9.7 9.2  NEUTROABS 11.4*  --   --   HGB 9.7* 8.3* 9.2*  HCT 30.8* 25.4* 28.4*  MCV 102.7* 98.1 99.0  PLT 322 236 246   Basic  Metabolic Panel  Recent Labs  03/04/16 1120  03/05/16 0327 03/05/16 0757  NA 121*  < > 129* 130*  K 6.3*  < > 4.2 3.7  CL 94*  < > 96* 96*  CO2 18*  < > 24 23  GLUCOSE 109*  < > 84 71  BUN 39*  < > 32* 32*  CREATININE 2.38*  < > 1.97* 1.83*  CALCIUM 8.9  < > 8.7* 8.8*  MG 2.1  --  1.6*  --   PHOS  --   --  3.9  --   < > = values in this interval not displayed. Liver Function Tests  Recent Labs  03/04/16 2006  AST 507*  ALT 647*  ALKPHOS 119  BILITOT 2.1*  PROT 5.2*  ALBUMIN 2.6*   No results for input(s): LIPASE, AMYLASE in the last 72 hours. Cardiac Enzymes  Recent Labs  03/04/16 2006  TROPONINI 0.05*   BNP Invalid input(s): POCBNP D-Dimer No results for input(s): DDIMER in the last 72 hours. Hemoglobin A1C No results for input(s): HGBA1C in the last 72 hours. Fasting Lipid Panel No results for input(s): CHOL, HDL, LDLCALC, TRIG, CHOLHDL, LDLDIRECT in the last 72 hours. Thyroid  Function Tests No results for input(s): TSH, T4TOTAL, T3FREE, THYROIDAB in the last 72 hours.  Invalid input(s): FREET3  Telemetry    Atrial fibrillation, PVCs. - Personally Reviewed  ECG    03/04/16: Atypical atrial flutter with variable conduction.  Ventricular rate 51 bpm.  Very wide RBBB.  LAD.  Prior anterior and inferior infarcts.  - Personally Reviewed  Radiology    Dg Chest Port 1 View  Result Date: 03/05/2016 CLINICAL DATA:  Respiratory failure. EXAM: PORTABLE CHEST 1 VIEW COMPARISON:  03/04/2016. FINDINGS: Endotracheal tube and NG tube are in stable position. Cardiomegaly with bilateral pulmonary edema again noted. Slight improvement. Bilateral pleural effusions again noted. No change. No pneumothorax. IMPRESSION: 1. Lines and tubes in stable position. 2. Cardiomegaly with bilateral pulmonary edema, improved from prior exams. Bilateral pleural effusions are unchanged. Electronically Signed   By: Marcello Moores  Register   On: 03/05/2016 07:06   Dg Chest Port 1 View  Result Date: 03/05/2016 CLINICAL DATA:  Check endotracheal tube placement EXAM: PORTABLE CHEST 1 VIEW COMPARISON:  03/04/2016 FINDINGS: Cardiac shadow remains enlarged. Endotracheal tube is noted approximately 4.4 cm above the carina. Nasogastric catheter is seen within the stomach. Right-sided pleural effusion and left retrocardiac consolidation are again seen. Multiple rib fractures are noted on the left. No pneumothorax is noted. IMPRESSION: Tubes and lines as described above. Bibasilar changes similar to that seen on the prior exam. Multiple left rib fractures Electronically Signed   By: Inez Catalina M.D.   On: 03/05/2016 07:54   Dg Chest Port 1 View  Result Date: 03/04/2016 CLINICAL DATA:  Intubation EXAM: PORTABLE CHEST 1 VIEW COMPARISON:  Portable exam 1243 hours compared 03/04/2016 FINDINGS: Tip of endotracheal tube projects 3.9 cm above carina. Nasogastric tube extends into stomach. External pacing leads project  over chest. Enlargement of cardiac silhouette with pulmonary vascular congestion. Persistent pulmonary infiltrates question edema and layered RIGHT pleural effusion. Atelectasis at LEFT base. No pneumothorax. Atherosclerotic calcification aorta. IMPRESSION: Tube positions as above. Probable mild pulmonary edema with RIGHT pleural effusion and LEFT basilar atelectasis. Electronically Signed   By: Lavonia Dana M.D.   On: 03/04/2016 13:00   Dg Chest Port 1 View  Result Date: 03/04/2016 CLINICAL DATA:  Cardiomyopathy.  Congestive heart failure EXAM: PORTABLE CHEST 1 VIEW  COMPARISON:  March 01, 2016 FINDINGS: There is persistent interstitial pulmonary edema with airspace consolidation in the lateral left perihilar region which is stable. There is a right pleural effusion which appears larger than on the prior study. There is cardiomegaly with pulmonary venous hypertension. There is persistent left base atelectasis. There is atherosclerotic calcification in the aorta. IMPRESSION: Findings consistent with a degree of congestive heart failure with slightly larger right pleural effusion. Suspect superimposed pneumonia left upper lobe/left perihilar region. Stable atelectasis left base. Cardiac silhouette unchanged. There is aortic atherosclerosis. Electronically Signed   By: Lowella Grip III M.D.   On: 03/04/2016 08:45    Cardiac Studies   Echo 01/01/16: Study Conclusions  - Left ventricle: The cavity size was mildly dilated. Wall   thickness was normal. Systolic function was severely reduced. The   estimated ejection fraction was in the range of 25% to 30%.   Diffuse hypokinesis. Indeterminant diastolic function. - Aortic valve: There was no stenosis. - Mitral valve: Mildly calcified annulus. There was no significant   regurgitation. - Left atrium: The atrium was mildly dilated. - Right ventricle: The cavity size was mildly dilated. Systolic   function was mildly reduced. - Right atrium: The  atrium was mildly dilated. - Pulmonary arteries: No complete TR doppler jet so unable to   estimate PA systolic pressure. - Systemic veins: IVC measured 2.0 cm with < 50% respirophasic   variation suggesting RA pressure 8 mmHg. .  Impressions:  - Technically difficult study with poor acoustic windows. Mildly   dilated LV with EF 25-30%. Diffuse hypokinesis. Mildly dilated RV   with mildly decreased systolic function. No significant valvular   abnormalities.  Patient Profile     Mr. Kahrs is a 71M with hypertension, hyperlipidemia, chronic systolic and diastolic heart failure (LVEF 25%) longstanding persistent atrial fibrillation, pulmonary fibrosis, COPD, NSVT, EtOH abuse and tobacco abuse transferred from [facility] with acute on chronic systolic and diastolic heart failure, bradycardia, and hypoxic respiratory failure requiring intubation.   Mr. Aloi had a prolonged hospitalization 12/2015 after a fall with multiple fractures (L ribs, L3-L4, L acetabular and L pneumothorax).  That hospitalization was c/b prolonged intubation and tracheostomy. Echo 01/01/16 revealed LVEF 25-30% with diffuse hypokinesis and mildly reduced RV function.  He was discharged to [facility] and decanulated 02/09/16, discharged home 02/23/16.  Presented to [facility] 03/12/2016 with dyspnea and weakness.  He was hypotensive, lactic acid of 7 and diagnosed with sepsis.  Sodium was 126, WBC 17K. CXR with LLL infiltrate and effusion. He was treated with IVF for sepsis with development of worsening bilateral (R>L) effusions.  Cardiology was consulted 10/16 for assistance with diuresis.  He became hypotensive with IV diuretics and renal function has worsened. He was started on dobutamine and amiodarone was discontinued due to new bradycardia.  He was noted to have pauses 4-6 seconds and hear rate sustained in th 40s, requiring atropine and external pacing. He became hypotensive with dobutamine, though HR increased to 70-100s.   Patient developed hypoxic respiratory failure requiring intubation.     Assessment & Plan    # Acute systolic and diastolic heart failure: LVEF 25% 12/2015.  Mr. Enochs received IV fluids in the setting of sepsis and became volume overloaded.  With dobutamine his BP has been high enough to support diuresis.  He is now off dobutamine. He was net negative 2.8L yesterday.  He continues to be volume overloaded, but not severely.  The main source of his respiratory distress  is likely the large R sided pleural effusion. If this does not improve with diuresis, he will likely require thoracentesis.  This is, of course, not going to happen while on the ventilator.  Though, would consider draining it soon after extubation.  We will get a PICC so that we can use it for inotropes as well as follow co-ox and CVP.  It is difficult to assess his JVP.  Lasix was increased to 80 mg q8h on 10/16 for one dose.  Prior to that he was receiving lasix 40 mg IV daily. This dose is reasonable, but we may quickly run into hypotension and worsening renal function.  Will monitor kidney function and volume status closely.  # Bradycardia:  It seems that his bradycardia may be have been secondary to hypoxic respiratory failure and volume overload.  THis has not been a longstanding issue for him.  Heart rates have been OK here, thouh he is on dobutamine.  Will continue to monitor for now and determine his need for PPM once more stable.   # Hypoxic respiratory failure: Attributable to pneumonia, pleural effusions and acute on chronic heart failure.   # Longstanding persistent atrial fibrillation:  Rate are well-controlled.  Amiodarone held 2/2 bradycardia.  He is on 150m daily at home.  He has not been anticoagulated due to a history of frequent falls.  # Demand ischemia: Troponin mildly elevate due to demand ischemia.  No ischemia evaluation at this time.   # Hyponatremia: Due to volume overload.  Improved to 130 today.  # AKI:  Improving. Diuresis as above.    Signed, TSkeet Latch MD  03/05/2016, 11:02 AM

## 2016-03-05 NOTE — Progress Notes (Signed)
RT assisted in transport of patient on vent to CT and back to 2H13. Pt's vitals remained stable throughout.

## 2016-03-05 NOTE — Progress Notes (Signed)
[facility]  Patient Status: Active (receiving services up to time of hospitalization)  [facility] is providing the following services: RN, PT, OT and ST  If patient discharges after hours, please call [phone].   Chad Rogers 03/05/2016, 3:55 PM

## 2016-03-05 NOTE — Progress Notes (Signed)
Peripherally Inserted Central Catheter/Midline Placement  The IV Nurse has discussed with the patient and/or persons authorized to consent for the patient, the purpose of this procedure and the potential benefits and risks involved with this procedure.  The benefits include less needle sticks, lab draws from the catheter, and the patient may be discharged home with the catheter. Risks include, but not limited to, infection, bleeding, blood clot (thrombus formation), and puncture of an artery; nerve damage and irregular heartbeat and possibility to perform a PICC exchange if needed/ordered by physician.  Alternatives to this procedure were also discussed.  [employer] PICC patient education guide, fact sheet on infection prevention and patient information card has been provided to patient /or left at bedside.    PICC/Midline Placement Documentation     Consent signed by pt wife at bedside  Synthia Innocent 03/05/2016, 5:40 PM

## 2016-03-05 NOTE — Progress Notes (Signed)
FIO2 increased to 100% for transport on vent to CT.

## 2016-03-05 NOTE — Progress Notes (Signed)
PULMONARY / CRITICAL CARE MEDICINE   Name: Chad Rogers MRN: [id number] DOB: [birth year]    ADMISSION DATE:  03/04/2016 CONSULTATION DATE:  03/04/16  REFERRING MD:  03/04/16  CHIEF COMPLAINT:  Dr. Wyline Copas   HISTORY OF PRESENT ILLNESS:   80 y/o M, smoker, with PMH of ETOH abuse, allergic rhinitis, psoriasis, skin cancer, osteoarthritis, CAD, HTN, atrial fibrillation (not on anticoagulation, Hx falls), systolic CHF (EF 123456), HLD, pulmonary fibrosis and COPD (previously followed by Dr. Melvyn Novas - 2011) admitted to [facility] on 02/21/2016 complaints of SOB.    The patient recently had a prolonged hospitalization after a fall with several rib fractures.  During that admit, he had a prolonged intubation requiring tracheostomy and admission to an LTAC followed by rehab.  The patient was apparently discharged one week prior to presentation.  He reported nausea, vomiting, weakness initially that resolved.  Subsequently, he developed worsening shortness of breath & DOE.  On admit, he was hypotensive, hypoxic and had an elevated lactic acid (7).  Work up concerning for new infiltrate & volume depletion with AKI.  The patient was admitted per [facility] for further care.  He responded well to IVF's and oxygen initially.  He was later found to have pulmonary edema with associated effusions and treated with Lasix.  Follow up CXR evaluation showed enlarging effusions.  Cardiology was consulted for evaluation.  During their evaluation, he was found to have pronounced bradycardia and pauses.  Atropine was administered and he was started on an external pacemaker.  HR was labile and he was started on dopamine.  He had subsequent respiratory distress with hypoxemia (sats dropped to 70's) with altered level of consciousness and required intubation.  He developed further metabolic derangements (123XX123, CO2 18, Hgb 9.7) 10/16 and was transferred to [facility] for further care.    Current labs - 7.245 / 29 / 90 / 13.7.  Na 121, K  6.3, Cl 94, CO2 18, BUN 39 / Sr Cr 2.38, glucose 109, Mg 2.1, WBC 14.9, Hgb 9.7, MCV 102.7 and platelets 322.  CXR 10/16 with concern for pulmonary edema, layering effusions and left basilar atelectasis.  SUBJECTIVE:   On vent but somewhat anxious / agitated. No issues overnight.   VITAL SIGNS: BP 100/66 (BP Location: Left Arm)   Pulse 100   Temp 97.9 F (36.6 C) (Oral)   Resp 16   Ht 5\' 10"  (1.778 m)   Wt 73.4 kg (161 lb 13.1 oz)   SpO2 100%   BMI 23.22 kg/m   HEMODYNAMICS:    VENTILATOR SETTINGS: Vent Mode: PRVC FiO2 (%):  [40 %-100 %] 40 % Set Rate:  [16 bmp] 16 bmp Vt Set:  [580 mL] 580 mL PEEP:  [5 cmH20] 5 cmH20 Plateau Pressure:  [12 cmH20-20 cmH20] 18 cmH20  INTAKE / OUTPUT: I/O last 3 completed shifts: In: 641.5 [I.V.:641.5] Out: [id number] [Urine:3225; Emesis/NG output:300]  PHYSICAL EXAMINATION: General: Chronically ill appearing male, resting in bed, in NAD. Neuro: Sedated but slightly agitated. Follows commands.  HEENT: [place]/AT. PERRL, sclerae anicteric. Cardiovascular: RRR, no M/R/G.  Lungs: Respirations even and unlabored. Bibasilar crackles.  Abdomen: BS x 4, soft, NT/ND.  Musculoskeletal: No gross deformities, no edema.  Skin: Dry, intact, warm, no rashes.   LABS:  BMET  Recent Labs Lab 03/04/16 2006 03/05/16 0327 03/05/16 0757  NA 125* 129* 130*  K 4.4 4.2 3.7  CL 91* 96* 96*  CO2 25 24 23   BUN 33* 32* 32*  CREATININE 2.05* 1.97* 1.83*  GLUCOSE 325* 84 71    Electrolytes  Recent Labs Lab 03/04/16 1120  03/04/16 2006 03/05/16 0327 03/05/16 0757  CALCIUM 8.9  < > 8.4* 8.7* 8.8*  MG 2.1  --   --  1.6*  --   PHOS  --   --   --  3.9  --   < > = values in this interval not displayed.  CBC  Recent Labs Lab 03/04/16 1120 03/04/16 2006 03/05/16 0327  WBC 14.9* 9.7 9.2  HGB 9.7* 8.3* 9.2*  HCT 30.8* 25.4* 28.4*  PLT 322 236 246    Coag's No results for input(s): APTT, INR in the last 168 hours.  Sepsis Markers  Recent  Labs Lab 02/29/16 0944 02/29/16 1247 03/04/16 2006 03/05/16 0327  LATICACIDVEN 1.9 2.0* 1.5  --   PROCALCITON  --   --  0.26 0.32    ABG  Recent Labs Lab 03/04/16 1815 03/04/16 2119 03/05/16 0458  PHART 7.363 7.356 7.390  PCO2ART 33.3 35.2 36.6  PO2ART 176* 177.0* 151*    Liver Enzymes  Recent Labs Lab 02/29/16 0545 03/01/16 0452 03/04/16 2006  AST 33 48* 507*  ALT 45 72* 647*  ALKPHOS 81 86 119  BILITOT 1.3* 1.2 2.1*  ALBUMIN 3.1* 3.1* 2.6*    Cardiac Enzymes  Recent Labs Lab 03/04/16 2006  TROPONINI 0.05*    Glucose  Recent Labs Lab 03/04/16 1538  GLUCAP 100*    Imaging Dg Chest Port 1 View  Result Date: 03/05/2016 CLINICAL DATA:  Respiratory failure. EXAM: PORTABLE CHEST 1 VIEW COMPARISON:  03/04/2016. FINDINGS: Endotracheal tube and NG tube are in stable position. Cardiomegaly with bilateral pulmonary edema again noted. Slight improvement. Bilateral pleural effusions again noted. No change. No pneumothorax. IMPRESSION: 1. Lines and tubes in stable position. 2. Cardiomegaly with bilateral pulmonary edema, improved from prior exams. Bilateral pleural effusions are unchanged. Electronically Signed   By: Marcello Moores  Register   On: 03/05/2016 07:06   Dg Chest Port 1 View  Result Date: 03/05/2016 CLINICAL DATA:  Check endotracheal tube placement EXAM: PORTABLE CHEST 1 VIEW COMPARISON:  03/04/2016 FINDINGS: Cardiac shadow remains enlarged. Endotracheal tube is noted approximately 4.4 cm above the carina. Nasogastric catheter is seen within the stomach. Right-sided pleural effusion and left retrocardiac consolidation are again seen. Multiple rib fractures are noted on the left. No pneumothorax is noted. IMPRESSION: Tubes and lines as described above. Bibasilar changes similar to that seen on the prior exam. Multiple left rib fractures Electronically Signed   By: Inez Catalina M.D.   On: 03/05/2016 07:54   Dg Chest Port 1 View  Result Date: 03/04/2016 CLINICAL  DATA:  Intubation EXAM: PORTABLE CHEST 1 VIEW COMPARISON:  Portable exam 1243 hours compared 03/04/2016 FINDINGS: Tip of endotracheal tube projects 3.9 cm above carina. Nasogastric tube extends into stomach. External pacing leads project over chest. Enlargement of cardiac silhouette with pulmonary vascular congestion. Persistent pulmonary infiltrates question edema and layered RIGHT pleural effusion. Atelectasis at LEFT base. No pneumothorax. Atherosclerotic calcification aorta. IMPRESSION: Tube positions as above. Probable mild pulmonary edema with RIGHT pleural effusion and LEFT basilar atelectasis. Electronically Signed   By: Lavonia Dana M.D.   On: 03/04/2016 13:00    STUDIES:  CXR 10/16 > mild pulmonary edema with right pleural effusion and left basilar atx.  CULTURES: Blood 10/11 > Sputum 10/16 >   ANTIBIOTICS: Vanc 10/11 > 10/16 Cefepime 10/11 >  SIGNIFICANT EVENTS: 10/11 > admitted to [facility]. 10/16 > transferred to [facility].  LINES/TUBES:  ETT 10/16 >  DISCUSSION: 80 y.o. male with multiple co-morbidities, admitted by [facility] 03/19/2016 with SOB suspected due to HCAP vs pulmonary edema. On 10/16, developed worsening hypoxia along with profound bradycardia requiring atropine, TC pacing, dopamine.  Required intubation due to respiratory distress and AMS.  Later transferred to [facility] for further evaluation and management.  ASSESSMENT / PLAN:  PULMONARY A: Acute hypoxic respiratory failure - s/p emergent intubation 03/04/16 for AMS, likely related to bradycardia HCAP. Pulmonary edema. Pleural effusions. Hx pulmonary fibrosis, COPD - followed by Dr. Melvyn Novas. Tobacco dependence. P:   Full vent support. Chest CT scan to better delineate parenchyma. CXR has B effusion and RML/RLL infiltrate.  Wean as able. VAP prevention measures. SBT in AM if able. Continue empiric cefepime (vanc stopped 10/16). Budesonide in lieu of preadmission Breo. DuoNebs scheduled / Albuterol PRN. Tobacco cessation  counseling once extubated.  CARDIOVASCULAR A:  Sinus bradycardia with pauses - s/p atropine and external pacing followed by dobutamine. Hx sCHF (echo from August 2017 with EF 25-30%), chronic a.fib (not on anticoagulation due to hx falls), HTN, HLD, CAD. P:  Not on dobutamine Cardiology aware of admission.  Assess echo. Cont lasix per Cards.   RENAL A:   Hyponatremia - presumed hypervolemic in setting CHF. Hyperkalemia - s/p kayexalate, insulin, dextrose, calcium, bicarb. Better.  NAGMA. AKI. P:   Off HCO3 drip.  Assess osmolality. Correct electrolytes as indicated.  GASTROINTESTINAL A:   GI prophylaxis. Nutrition. Transaminitis likely from [facility] 2/2 CHF P:   SUP: Pantoprazole. Start TF Check LFts in am  HEMATOLOGIC A:   VTE Prophylaxis. P:  SCD's / heparin. CBC in AM.  INFECTIOUS A:   HCAP. P:   Abx as above (Continue empiric cefepime (vanc stopped 10/16)).  Send sputum culture. PCT algorithm to limit abx exposure.  ENDOCRINE A:   No acute issues. P:   Monitor glucose on BMP.  NEUROLOGIC A:   Acute encephalopathy. Hx ETOH abuse. P:   Sedation:  Fentanyl gtt / Midazolam PRN. RASS goal: 0 to -1. Daily WUA. Thiamine / Folate.  Hold preadmission mirtazapine, quetiapine, sertraline. D/c zolpidem.  Family updated: Granddaughter at bedside and updated.   Interdisciplinary Family Meeting v Palliative Care Meeting:  Due by: 03/11/16.  Critical Care time with this patient today: 30 minutes.  Monica Becton, MD 03/05/2016, 11:14 AM [place] [facility] Pager [phone] After 3 pm or if no answer, call [phone]

## 2016-03-06 DIAGNOSIS — R570 Cardiogenic shock: Secondary | ICD-10-CM

## 2016-03-06 LAB — CARBOXYHEMOGLOBIN - COOX: Carboxyhemoglobin: 1.9 % — ABNORMAL HIGH (ref 0.5–1.5)

## 2016-03-06 LAB — PHOSPHORUS
Phosphorus: 3.6 mg/dL (ref 2.5–4.6)
Phosphorus: 2.9 mg/dL (ref 2.5–4.6)

## 2016-03-06 LAB — HEPATIC FUNCTION PANEL
ALK PHOS: 111 U/L (ref 38–126)
ALT: 421 U/L — AB (ref 17–63)
AST: 137 U/L — ABNORMAL HIGH (ref 15–41)
Albumin: 2.4 g/dL — ABNORMAL LOW (ref 3.5–5.0)
BILIRUBIN DIRECT: 1 mg/dL — AB (ref 0.1–0.5)
BILIRUBIN INDIRECT: 0.9 mg/dL (ref 0.3–0.9)
Total Bilirubin: 1.9 mg/dL — ABNORMAL HIGH (ref 0.3–1.2)
Total Protein: 5.2 g/dL — ABNORMAL LOW (ref 6.5–8.1)

## 2016-03-06 LAB — BASIC METABOLIC PANEL
Anion gap: 10 (ref 5–15)
Anion gap: 7 (ref 5–15)
Anion gap: 6 (ref 5–15)
BUN: 26 mg/dL — ABNORMAL HIGH (ref 6–20)
BUN: 28 mg/dL — AB (ref 6–20)
BUN: 28 mg/dL — AB (ref 6–20)
CALCIUM: 8.3 mg/dL — AB (ref 8.9–10.3)
CALCIUM: 8.2 mg/dL — AB (ref 8.9–10.3)
CHLORIDE: 97 mmol/L — AB (ref 101–111)
CO2: 27 mmol/L (ref 22–32)
CO2: 29 mmol/L (ref 22–32)
CO2: 28 mmol/L (ref 22–32)
CREATININE: 1.41 mg/dL — AB (ref 0.61–1.24)
CREATININE: 1.25 mg/dL — AB (ref 0.61–1.24)
CREATININE: 1.15 mg/dL (ref 0.61–1.24)
Calcium: 8.3 mg/dL — ABNORMAL LOW (ref 8.9–10.3)
Chloride: 91 mmol/L — ABNORMAL LOW (ref 101–111)
Chloride: 98 mmol/L — ABNORMAL LOW (ref 101–111)
GFR calc Af Amer: 54 mL/min — ABNORMAL LOW (ref >60)
GFR calc Af Amer: >60 mL/min (ref >60)
GFR calc non Af Amer: 46 mL/min — ABNORMAL LOW (ref >60)
GFR calc non Af Amer: 53 mL/min — ABNORMAL LOW (ref >60)
GFR calc non Af Amer: 59 mL/min — ABNORMAL LOW (ref >60)
GLUCOSE: 180 mg/dL — AB (ref 65–99)
GLUCOSE: 120 mg/dL — AB (ref 65–99)
Glucose, Bld: 166 mg/dL — ABNORMAL HIGH (ref 65–99)
Potassium: 2.7 mmol/L — CL (ref 3.5–5.1)
Potassium: 5 mmol/L (ref 3.5–5.1)
Potassium: 4.2 mmol/L (ref 3.5–5.1)
SODIUM: 133 mmol/L — AB (ref 135–145)
SODIUM: 132 mmol/L — AB (ref 135–145)
Sodium: 128 mmol/L — ABNORMAL LOW (ref 135–145)

## 2016-03-06 LAB — COOXEMETRY PANEL
CARBOXYHEMOGLOBIN: 2.1 % — AB (ref 0.5–1.5)
Carboxyhemoglobin: 1.9 % — ABNORMAL HIGH (ref 0.5–1.5)
METHEMOGLOBIN: 0.5 % (ref 0.0–1.5)
Methemoglobin: 0.8 % (ref 0.0–1.5)
O2 SAT: 67.2 %
O2 Saturation: 84.2 %
Total hemoglobin: 9.4 g/dL — ABNORMAL LOW (ref 12.0–16.0)
Total hemoglobin: 8.3 g/dL — ABNORMAL LOW (ref 12.0–16.0)

## 2016-03-06 LAB — GLUCOSE, CAPILLARY
GLUCOSE-CAPILLARY: 125 mg/dL — AB (ref 65–99)
GLUCOSE-CAPILLARY: 171 mg/dL — AB (ref 65–99)
GLUCOSE-CAPILLARY: 186 mg/dL — AB (ref 65–99)
GLUCOSE-CAPILLARY: 92 mg/dL (ref 65–99)
Glucose-Capillary: 146 mg/dL — ABNORMAL HIGH (ref 65–99)

## 2016-03-06 LAB — MAGNESIUM
Magnesium: 1.5 mg/dL — ABNORMAL LOW (ref 1.7–2.4)
Magnesium: 2.2 mg/dL (ref 1.7–2.4)

## 2016-03-06 LAB — CBC
HEMATOCRIT: 28.5 % — AB (ref 39.0–52.0)
Hemoglobin: 9.1 g/dL — ABNORMAL LOW (ref 13.0–17.0)
MCH: 31.2 pg (ref 26.0–34.0)
MCHC: 31.9 g/dL (ref 30.0–36.0)
MCV: 97.6 fL (ref 78.0–100.0)
Platelets: 227 10*3/uL (ref 150–400)
RBC: 2.92 MIL/uL — ABNORMAL LOW (ref 4.22–5.81)
RDW: 18.7 % — AB (ref 11.5–15.5)
WBC: 7.6 10*3/uL (ref 4.0–10.5)

## 2016-03-06 LAB — PROCALCITONIN: PROCALCITONIN: 0.19 ng/mL

## 2016-03-06 MED ORDER — INSULIN ASPART 100 UNIT/ML ~~LOC~~ SOLN
0.0000 [IU] | SUBCUTANEOUS | Status: DC
  Administered 2016-03-06: 2 [IU] via SUBCUTANEOUS
  Administered 2016-03-07 (×5): 1 [IU] via SUBCUTANEOUS
  Administered 2016-03-07: 2 [IU] via SUBCUTANEOUS
  Administered 2016-03-07 – 2016-03-09 (×7): 1 [IU] via SUBCUTANEOUS
  Administered 2016-03-10 (×2): 2 [IU] via SUBCUTANEOUS
  Administered 2016-03-10 – 2016-03-11 (×4): 1 [IU] via SUBCUTANEOUS

## 2016-03-06 MED ORDER — POTASSIUM CHLORIDE 20 MEQ/15ML (10%) PO SOLN
40.0000 meq | Freq: Two times a day (BID) | ORAL | Status: AC
  Administered 2016-03-06 (×2): 40 meq
  Filled 2016-03-06: qty 30

## 2016-03-06 MED ORDER — MAGNESIUM SULFATE 2 GM/50ML IV SOLN
2.0000 g | Freq: Once | INTRAVENOUS | Status: AC
  Administered 2016-03-06: 2 g via INTRAVENOUS
  Filled 2016-03-06: qty 50

## 2016-03-06 MED ORDER — POTASSIUM CHLORIDE 10 MEQ/50ML IV SOLN
10.0000 meq | INTRAVENOUS | Status: AC
  Administered 2016-03-06 (×6): 10 meq via INTRAVENOUS
  Filled 2016-03-06 (×6): qty 50

## 2016-03-06 NOTE — Consult Note (Signed)
   [facility] CM Inpatient Consult   03/06/2016  Chad Rogers [birth year] [id number]    Patient screened for potential [facility] Management services for 3 admission in 6 months. Patient is eligible for [facility] Care Management services under patient's [facility] plan. Chart reveals patient is currently on full vent support. [facility] Care Management can follow for post hospital community needs, if appropriate. For questions contact:   Natividad Brood, RN BSN [facility] Liaison  [phone] business mobile phone Toll free office [phone]

## 2016-03-06 NOTE — Progress Notes (Addendum)
Patient Name: Chad Rogers Date of Encounter: 03/06/2016  Primary Cardiologist: [facility] Problem List     Principal Problem:   Sepsis [facility]) Active Problems:   Hypertension   Pulmonary fibrosis ([place])   Tobacco abuse   Traumatic fracture of ribs with pneumothorax   Multiple rib fractures   Chronic systolic CHF (congestive heart failure) (HCC)   Atrial fibrillation, persistent (HCC)   Debility   Healthcare-associated pneumonia   Protein-calorie malnutrition, severe   Metabolic acidosis   CHF (congestive heart failure) (HCC)   Endotracheally intubated   Acute encephalopathy   AKI (acute kidney injury) ([place])   Hyperkalemia   Pressure injury of skin   Acute hypoxemic respiratory failure (HCC)   Acute pulmonary edema (HCC)   Acute combined systolic and diastolic heart failure (HCC)     Subjective   Intubated and awake, but unable to talk or write.  Denies pain.   Inpatient Medications    Scheduled Meds: . budesonide (PULMICORT) nebulizer solution  0.5 mg Nebulization BID  . ceFEPime (MAXIPIME) IV  2 g Intravenous Q24H  . chlorhexidine gluconate (MEDLINE KIT)  15 mL Mouth Rinse BID  . docusate  100 mg Per Tube Daily  . feeding supplement (PRO-STAT SUGAR FREE 64)  30 mL Per Tube BID  . folic acid  1 mg Per Tube Daily  . furosemide  80 mg Intravenous Q8H  . heparin  5,000 Units Subcutaneous Q8H  . hydrocortisone cream   Topical BID  . ipratropium-albuterol  3 mL Nebulization Q6H  . magnesium sulfate 1 - 4 g bolus IVPB  2 g Intravenous Once  . mouth rinse  15 mL Mouth Rinse 10 times per day  . multivitamin with minerals  1 tablet Per Tube Daily  . pantoprazole sodium  40 mg Per Tube Q1200  . potassium chloride  10 mEq Intravenous Q1 Hr x 6  . potassium chloride  40 mEq Per Tube BID  . sodium chloride flush  10-40 mL Intracatheter Q12H  . thiamine  100 mg Per Tube Daily   Continuous Infusions: . DOBUTamine Stopped (03/05/16 1429)  . DOBUTamine 17.5  mcg/kg/min (03/06/16 0025)  . feeding supplement (VITAL AF 1.2 CAL) 1,000 mL (03/05/16 1822)  . fentaNYL infusion INTRAVENOUS 125 mcg/hr (03/06/16 0100)   PRN Meds: albuterol, fentaNYL, midazolam, midazolam, sodium chloride flush   Vital Signs    Vitals:   03/06/16 0445 03/06/16 0500 03/06/16 0600 03/06/16 0623  BP:  95/60 (!) 92/53 92/60  Pulse:  (!) 43 (!) 46 (!) 107  Resp:  16 16 16   Temp:      TempSrc:      SpO2:  100% 96% 95%  Weight: 67 kg (147 lb 11.3 oz)     Height:        Intake/Output Summary (Last 24 hours) at 03/06/16 0839 Last data filed at 03/06/16 0835  Gross per 24 hour  Intake          1319.57 ml  Output             3660 ml  Net         -2340.43 ml   Filed Weights   03/02/16 0400 03/04/16 2000 03/06/16 0445  Weight: 74.1 kg (163 lb 5.8 oz) 73.4 kg (161 lb 13.1 oz) 67 kg (147 lb 11.3 oz)    Physical Exam   GEN: Chritically ill-appearing and frail.  No acute distress.   HEENT: Grossly normal.  Neck: Supple, no JVD  No carotid bruits, or masses. Cardiac: Irregularly irregular rhythm, no murmurs, rubs, or gallops. No clubbing, cyanosis, edema.  Radials/DP/PT 2+ and equal bilaterally.  Respiratory:  Vented breath sounds  GI: Soft, nontender, nondistended, BS + x 4. MS: no deformity or atrophy. Skin: warm and dry, no rash. Neuro:  Strength and sensation are intact. Psych: AAOx3.  Normal affect.  Labs    CBC  Recent Labs  03/04/16 1120  03/05/16 0327 03/06/16 0425  WBC 14.9*  < > 9.2 7.6  NEUTROABS 11.4*  --   --   --   HGB 9.7*  < > 9.2* 9.1*  HCT 30.8*  < > 28.4* 28.5*  MCV 102.7*  < > 99.0 97.6  PLT 322  < > 246 227  < > = values in this interval not displayed. Basic Metabolic Panel  Recent Labs  03/05/16 0757 03/05/16 2017 03/06/16 0425  NA 130*  --  128*  K 3.7  --  2.7*  CL 96*  --  91*  CO2 23  --  27  GLUCOSE 71  --  180*  BUN 32*  --  26*  CREATININE 1.83*  --  1.41*  CALCIUM 8.8*  --  8.3*  MG  --  1.7 1.5*  PHOS  --   3.5 3.6   Liver Function Tests  Recent Labs  03/04/16 2006 03/06/16 0425  AST 507* 137*  ALT 647* 421*  ALKPHOS 119 111  BILITOT 2.1* 1.9*  PROT 5.2* 5.2*  ALBUMIN 2.6* 2.4*   No results for input(s): LIPASE, AMYLASE in the last 72 hours. Cardiac Enzymes  Recent Labs  03/04/16 2006  TROPONINI 0.05*   BNP Invalid input(s): POCBNP D-Dimer No results for input(s): DDIMER in the last 72 hours. Hemoglobin A1C No results for input(s): HGBA1C in the last 72 hours. Fasting Lipid Panel No results for input(s): CHOL, HDL, LDLCALC, TRIG, CHOLHDL, LDLDIRECT in the last 72 hours. Thyroid Function Tests No results for input(s): TSH, T4TOTAL, T3FREE, THYROIDAB in the last 72 hours.  Invalid input(s): FREET3  Telemetry    Atrial fibrillation, PVCs. - Personally Reviewed  ECG    03/04/16: Atypical atrial flutter with variable conduction.  Ventricular rate 51 bpm.  Very wide RBBB.  LAD.  Prior anterior and inferior infarcts.  - Personally Reviewed  Radiology    Ct Chest Wo Contrast  Result Date: 03/05/2016 CLINICAL DATA:  Respiratory failure. EXAM: CT CHEST WITHOUT CONTRAST TECHNIQUE: Multidetector CT imaging of the chest was performed following the standard protocol without IV contrast. COMPARISON:  Chest radiograph 03/05/2016 ; chest CT 12/28/2015. FINDINGS: Cardiovascular: Heart is enlarged. Aortic atherosclerosis. Coronary arterial calcifications. Mediastinum/Nodes: ET tube terminates in the mid trachea. Enteric tube tip terminates in the stomach. No enlarged axillary, mediastinal or hilar lymphadenopathy. Lungs/Pleura: Central airways are patent. Moderate right and small left pleural effusions. Underlying ground-glass and consolidative opacities within the lower lobes bilaterally. No pneumothorax. Multiple small scattered predominately ground-glass pulmonary nodules are demonstrated throughout the aerated upper lungs bilaterally, predominately the right upper lobe (image 27;  series 205). Upper Abdomen: High attenuation material within the gallbladder lumen. Musculoskeletal: Multiple bilateral rib fractures are demonstrated, many with early callus formation. There are multiple displaced rib fractures involving the left fourth-eighth ribs. IMPRESSION: Moderate right and small left pleural effusions. A portion of the superior aspect of the left pleural effusion may be loculated. Underlying consolidation within the lower lobes bilaterally likely represents atelectasis. Infection not excluded. Multiple tiny bilateral ground-glass pulmonary nodules are  nonspecific and may be infectious/inflammatory in etiology. After resolution of the acute symptomatology, recommend follow-up chest CT to evaluate for resolution or persistence. Multiple bilateral rib fractures. Many of the lateral left rib fractures are foreshortened and overlapping. Cardiomegaly. Aortic atherosclerosis. High attenuation in the gallbladder lumen may represent sludge. Electronically Signed   By: Lovey Newcomer M.D.   On: 03/05/2016 17:21   Dg Chest Port 1 View  Result Date: 03/05/2016 CLINICAL DATA:  Right PICC line placement EXAM: PORTABLE CHEST 1 VIEW COMPARISON:  Chest CT 03/05/2016, chest radiograph 03/05/2016 FINDINGS: Right PICC line tip overlies the lower SVC. Bilateral pleural effusions and bibasilar opacities are unchanged. The remainder of the support apparatus is unchanged. IMPRESSION: Right approach PICC line tip overlying the lower SVC. Electronically Signed   By: Ulyses Jarred M.D.   On: 03/05/2016 18:18   Dg Chest Port 1 View  Result Date: 03/05/2016 CLINICAL DATA:  Dislodged endotracheal tube.  ET tube placement. EXAM: PORTABLE CHEST 1 VIEW COMPARISON:  03/05/2016 FINDINGS: Endotracheal tube with the tip 4.5 cm above the carina. Nasogastric tube coursing below the diaphragm. Small bilateral pleural effusions. Diffuse interstitial and alveolar airspace opacities. No pneumothorax. Mild stable cardiomegaly.  The osseous structures are unremarkable. IMPRESSION: 1. Endotracheal tube 4.5 cm above the carina. 2. Findings most compatible with CHF. Electronically Signed   By: Kathreen Devoid   On: 03/05/2016 11:58   Dg Chest Port 1 View  Result Date: 03/05/2016 CLINICAL DATA:  Respiratory failure. EXAM: PORTABLE CHEST 1 VIEW COMPARISON:  03/04/2016. FINDINGS: Endotracheal tube and NG tube are in stable position. Cardiomegaly with bilateral pulmonary edema again noted. Slight improvement. Bilateral pleural effusions again noted. No change. No pneumothorax. IMPRESSION: 1. Lines and tubes in stable position. 2. Cardiomegaly with bilateral pulmonary edema, improved from prior exams. Bilateral pleural effusions are unchanged. Electronically Signed   By: Marcello Moores  Register   On: 03/05/2016 07:06   Dg Chest Port 1 View  Result Date: 03/05/2016 CLINICAL DATA:  Check endotracheal tube placement EXAM: PORTABLE CHEST 1 VIEW COMPARISON:  03/04/2016 FINDINGS: Cardiac shadow remains enlarged. Endotracheal tube is noted approximately 4.4 cm above the carina. Nasogastric catheter is seen within the stomach. Right-sided pleural effusion and left retrocardiac consolidation are again seen. Multiple rib fractures are noted on the left. No pneumothorax is noted. IMPRESSION: Tubes and lines as described above. Bibasilar changes similar to that seen on the prior exam. Multiple left rib fractures Electronically Signed   By: Inez Catalina M.D.   On: 03/05/2016 07:54   Dg Chest Port 1 View  Result Date: 03/04/2016 CLINICAL DATA:  Intubation EXAM: PORTABLE CHEST 1 VIEW COMPARISON:  Portable exam 1243 hours compared 03/04/2016 FINDINGS: Tip of endotracheal tube projects 3.9 cm above carina. Nasogastric tube extends into stomach. External pacing leads project over chest. Enlargement of cardiac silhouette with pulmonary vascular congestion. Persistent pulmonary infiltrates question edema and layered RIGHT pleural effusion. Atelectasis at LEFT  base. No pneumothorax. Atherosclerotic calcification aorta. IMPRESSION: Tube positions as above. Probable mild pulmonary edema with RIGHT pleural effusion and LEFT basilar atelectasis. Electronically Signed   By: Lavonia Dana M.D.   On: 03/04/2016 13:00    Cardiac Studies   Echo 01/01/16: Study Conclusions  - Left ventricle: The cavity size was mildly dilated. Wall   thickness was normal. Systolic function was severely reduced. The   estimated ejection fraction was in the range of 25% to 30%.   Diffuse hypokinesis. Indeterminant diastolic function. - Aortic valve: There was no stenosis. -  Mitral valve: Mildly calcified annulus. There was no significant   regurgitation. - Left atrium: The atrium was mildly dilated. - Right ventricle: The cavity size was mildly dilated. Systolic   function was mildly reduced. - Right atrium: The atrium was mildly dilated. - Pulmonary arteries: No complete TR doppler jet so unable to   estimate PA systolic pressure. - Systemic veins: IVC measured 2.0 cm with < 50% respirophasic   variation suggesting RA pressure 8 mmHg. .  Impressions:  - Technically difficult study with poor acoustic windows. Mildly   dilated LV with EF 25-30%. Diffuse hypokinesis. Mildly dilated RV   with mildly decreased systolic function. No significant valvular   abnormalities.  Patient Profile     Chad Rogers is a 82M with hypertension, hyperlipidemia, chronic systolic and diastolic heart failure (LVEF 25%) longstanding persistent atrial fibrillation, pulmonary fibrosis, COPD, NSVT, EtOH abuse and tobacco abuse transferred from [facility] with acute on chronic systolic and diastolic heart failure, bradycardia, and hypoxic respiratory failure requiring intubation.   Chad Rogers had a prolonged hospitalization 12/2015 after a fall with multiple fractures (L ribs, L3-L4, L acetabular and L pneumothorax).  That hospitalization was c/b prolonged intubation and tracheostomy. Echo  01/01/16 revealed LVEF 25-30% with diffuse hypokinesis and mildly reduced RV function.  He was discharged to [facility] and decanulated 02/09/16, discharged home 02/23/16.  Presented to [facility] 03/07/2016 with dyspnea and weakness.  He was hypotensive, lactic acid of 7 and diagnosed with sepsis.  Sodium was 126, WBC 17K. CXR with LLL infiltrate and effusion. He was treated with IVF for sepsis with development of worsening bilateral (R>L) effusions.  Cardiology was consulted 10/16 for assistance with diuresis.  He became hypotensive with IV diuretics and renal function has worsened. He was started on dobutamine and amiodarone was discontinued due to new bradycardia.  He was noted to have pauses 4-6 seconds and hear rate sustained in th 40s, requiring atropine and external pacing. He became hypotensive with dobutamine, though HR increased to 70-100s.  Patient developed hypoxic respiratory failure requiring intubation.     Assessment & Plan    # Acute systolic and diastolic heart failure: # Cardiogenic shock:   LVEF 25% 12/2015.  Chad Rogers received IV fluids in the setting of sepsis and became volume overloaded.  With dobutamine his BP has been high enough to support diuresis.  Dobutamine is up to 17.5 today.  He is net negative 3.2L in the last 24 hours.   His creatinine continues to improve. His a.m. Lasix was held. CVP is 2. Therefore, we will stop IV Lasix for now. He does not appear to be volume overloaded on exam. He continues to have pulmonary edema, but his intravascular volume status is low. Mixed venous sats are 67.2-75.8.  Continue dobutamine to maintain MAPs >65.  As we wean the sedation fully we will be able to titrate the dobutamine down.   We will repeat a BMP this afternoon. If his renal function is stable and CVP starts to rise we will give more Lasix.  # Hypokalemia:  Potassium was 2.7 this a.m., due to diuresis. He actively repleted. We will check a BMP at 2 PM.  # Bradycardia:  It seems that  his bradycardia may be have been secondary to hypoxic respiratory failure and volume overload.  This has not been a longstanding issue for him.  Heart rates have been OK here, thouh he is on dobutamine.  Will continue to monitor for now and determine his need  for PPM once more stable.   # Hypoxic respiratory failure: Attributable to pneumonia, pleural effusions and acute on chronic heart failure. Vent management per pulm/critical care.  # Longstanding persistent atrial fibrillation:  Rate are well-controlled.  Amiodarone held 2/2 bradycardia.  He is on 18m daily at home.  He has not been anticoagulated due to a history of frequent falls.  # Demand ischemia: Troponin mildly elevate due to demand ischemia.  No ischemia evaluation at this time.   # Hyponatremia: Likely due to hypervolemic hyponatremia.  # AKI: Improving.  Holding diuresis due to low CVP as above.   Signed, TSkeet Latch MD  03/06/2016, 8:39 AM

## 2016-03-06 NOTE — Progress Notes (Signed)
[place] Progress Note Patient Name: Chad Rogers DOB: 1935-07-27 MRN: [id number]   Date of Service  03/06/2016  HPI/Events of Note    eICU Interventions  Hypokalemia, repleted       Intervention Category Intermediate Interventions: Electrolyte abnormality - evaluation and management  Simonne Maffucci 03/06/2016, 5:33 AM

## 2016-03-06 NOTE — Progress Notes (Signed)
CRITICAL VALUE ALERT  Critical value received:  Potassium 2.7  Date of notification:  03/06/2016  Time of notification:  0525  Critical value read back:Yes.    Nurse who received alert:  Arnetha Courser, RN  MD notified (1st Daelin Haste):  Warren Lacy  Time of first Khaliya Golinski:  0525  Responding MD:  Lake Bells  Time MD responded:  [phone]

## 2016-03-06 NOTE — Progress Notes (Signed)
Pt placed back on full vent support due to decreased pt effort, low RR, MVe.

## 2016-03-06 NOTE — Progress Notes (Signed)
PULMONARY / CRITICAL CARE MEDICINE   Name: Chad Rogers MRN: [id number] DOB: 26-Apr-1936    ADMISSION DATE:  03/03/2016 CONSULTATION DATE:  03/04/16  REFERRING MD:  03/04/16  CHIEF COMPLAINT:  Dr. Wyline Copas   HISTORY OF PRESENT ILLNESS:   80 y/o M, smoker, with PMH of ETOH abuse, allergic rhinitis, psoriasis, skin cancer, osteoarthritis, CAD, HTN, atrial fibrillation (not on anticoagulation, Hx falls), systolic CHF (EF [id number]), HLD, pulmonary fibrosis and COPD (previously followed by Dr. Melvyn Novas - 2011) admitted to [facility] on 03/11/2016 complaints of SOB.    The patient recently had a prolonged hospitalization after a fall with several rib fractures.  During that admit, he had a prolonged intubation requiring tracheostomy and admission to an LTAC followed by rehab.  The patient was apparently discharged one week prior to presentation.  He reported nausea, vomiting, weakness initially that resolved.  Subsequently, he developed worsening shortness of breath & DOE.  On admit, he was hypotensive, hypoxic and had an elevated lactic acid (7).  Work up concerning for new infiltrate & volume depletion with AKI.  The patient was admitted per [facility] for further care.  He responded well to IVF's and oxygen initially.  He was later found to have pulmonary edema with associated effusions and treated with Lasix.  Follow up CXR evaluation showed enlarging effusions.  Cardiology was consulted for evaluation.  During their evaluation, he was found to have pronounced bradycardia and pauses.  Atropine was administered and he was started on an external pacemaker.  HR was labile and he was started on dopamine.  He had subsequent respiratory distress with hypoxemia (sats dropped to 70's) with altered level of consciousness and required intubation.  He developed further metabolic derangements ([id number], CO2 18, Hgb 9.7) 10/16 and was transferred to [facility] for further care.    Current labs - 7.245 / 29 / 90 / 13.7.  Na 121, K  6.3, Cl 94, CO2 18, BUN 39 / Sr Cr 2.38, glucose 109, Mg 2.1, WBC 14.9, Hgb 9.7, MCV 102.7 and platelets 322.  CXR 10/16 with concern for pulmonary edema, layering effusions and left basilar atelectasis.  SUBJECTIVE:   On dobutamine drip to support BP and CHF Had anxiety issues last night > was sedated this am > cutting down on sedation. Failed PST this am 2/2 sedation.    VITAL SIGNS: BP 91/62   Pulse (!) 117   Temp 98.4 F (36.9 C)   Resp 17   Ht 5\' 10"  (1.778 m)   Wt 67 kg (147 lb 11.3 oz)   SpO2 100%   BMI 21.19 kg/m   HEMODYNAMICS: CVP:  [2 mmHg-3 mmHg] 2 mmHg  VENTILATOR SETTINGS: Vent Mode: PRVC FiO2 (%):  [40 %-100 %] 40 % Set Rate:  [16 bmp] 16 bmp Vt Set:  [580 mL] 580 mL PEEP:  [5 cmH20] 5 cmH20 Plateau Pressure:  [16 cmH20-18 cmH20] 18 cmH20  INTAKE / OUTPUT: I/O last 3 completed shifts: In: 1966.6 [I.V.:1044.9; NG/GT:771.7; IV Piggyback:150] Out: [id number] [Urine:7735; Emesis/NG output:300]  PHYSICAL EXAMINATION: General: Chronically ill appearing male, resting in bed, in NAD. Neuro: Sedated but slightly agitated. Follows commands.  HEENT: [place]/AT. PERRL, sclerae anicteric. Cardiovascular: RRR, no M/R/G.  Lungs: Respirations even and unlabored. Bibasilar crackles.  Abdomen: BS x 4, soft, NT/ND.  Musculoskeletal: No gross deformities, no edema.  Skin: Dry, intact, warm, no rashes.   LABS:  BMET  Recent Labs Lab 03/05/16 0327 03/05/16 0757 03/06/16 0425  NA 129* 130* 128*  K 4.2 3.7 2.7*  CL 96* 96* 91*  CO2 24 23 27   BUN 32* 32* 26*  CREATININE 1.97* 1.83* 1.41*  GLUCOSE 84 71 180*    Electrolytes  Recent Labs Lab 03/05/16 0327 03/05/16 0757 03/05/16 2017 03/06/16 0425  CALCIUM 8.7* 8.8*  --  8.3*  MG 1.6*  --  1.7 1.5*  PHOS 3.9  --  3.5 3.6    CBC  Recent Labs Lab 03/04/16 2006 03/05/16 0327 03/06/16 0425  WBC 9.7 9.2 7.6  HGB 8.3* 9.2* 9.1*  HCT 25.4* 28.4* 28.5*  PLT 236 246 227    Coag's No results for input(s):  APTT, INR in the last 168 hours.  Sepsis Markers  Recent Labs Lab 02/29/16 0944 02/29/16 1247 03/04/16 2006 03/05/16 0327 03/06/16 0425  LATICACIDVEN 1.9 2.0* 1.5  --   --   PROCALCITON  --   --  0.26 0.32 0.19    ABG  Recent Labs Lab 03/04/16 1815 03/04/16 2119 03/05/16 0458  PHART 7.363 7.356 7.390  PCO2ART 33.3 35.2 36.6  PO2ART 176* 177.0* 151*    Liver Enzymes  Recent Labs Lab 03/01/16 0452 03/04/16 2006 03/06/16 0425  AST 48* 507* 137*  ALT 72* 647* 421*  ALKPHOS 86 119 111  BILITOT 1.2 2.1* 1.9*  ALBUMIN 3.1* 2.6* 2.4*    Cardiac Enzymes  Recent Labs Lab 03/04/16 2006  TROPONINI 0.05*    Glucose  Recent Labs Lab 03/05/16 1118 03/05/16 1615 03/05/16 1947 03/05/16 2358 03/06/16 0418 03/06/16 0824  GLUCAP 74 87 86 144* 171* 146*    Imaging Ct Chest Wo Contrast  Result Date: 03/05/2016 CLINICAL DATA:  Respiratory failure. EXAM: CT CHEST WITHOUT CONTRAST TECHNIQUE: Multidetector CT imaging of the chest was performed following the standard protocol without IV contrast. COMPARISON:  Chest radiograph 03/05/2016 ; chest CT 12/28/2015. FINDINGS: Cardiovascular: Heart is enlarged. Aortic atherosclerosis. Coronary arterial calcifications. Mediastinum/Nodes: ET tube terminates in the mid trachea. Enteric tube tip terminates in the stomach. No enlarged axillary, mediastinal or hilar lymphadenopathy. Lungs/Pleura: Central airways are patent. Moderate right and small left pleural effusions. Underlying ground-glass and consolidative opacities within the lower lobes bilaterally. No pneumothorax. Multiple small scattered predominately ground-glass pulmonary nodules are demonstrated throughout the aerated upper lungs bilaterally, predominately the right upper lobe (image 27; series 205). Upper Abdomen: High attenuation material within the gallbladder lumen. Musculoskeletal: Multiple bilateral rib fractures are demonstrated, many with early callus formation. There  are multiple displaced rib fractures involving the left fourth-eighth ribs. IMPRESSION: Moderate right and small left pleural effusions. A portion of the superior aspect of the left pleural effusion may be loculated. Underlying consolidation within the lower lobes bilaterally likely represents atelectasis. Infection not excluded. Multiple tiny bilateral ground-glass pulmonary nodules are nonspecific and may be infectious/inflammatory in etiology. After resolution of the acute symptomatology, recommend follow-up chest CT to evaluate for resolution or persistence. Multiple bilateral rib fractures. Many of the lateral left rib fractures are foreshortened and overlapping. Cardiomegaly. Aortic atherosclerosis. High attenuation in the gallbladder lumen may represent sludge. Electronically Signed   By: Lovey Newcomer M.D.   On: 03/05/2016 17:21   Dg Chest Port 1 View  Result Date: 03/05/2016 CLINICAL DATA:  Right PICC line placement EXAM: PORTABLE CHEST 1 VIEW COMPARISON:  Chest CT 03/05/2016, chest radiograph 03/05/2016 FINDINGS: Right PICC line tip overlies the lower SVC. Bilateral pleural effusions and bibasilar opacities are unchanged. The remainder of the support apparatus is unchanged. IMPRESSION: Right approach PICC line tip overlying the lower  SVC. Electronically Signed   By: Ulyses Jarred M.D.   On: 03/05/2016 18:18    STUDIES:  CXR 10/16 > mild pulmonary edema with right pleural effusion and left basilar atx.  CULTURES: Blood 10/11 > (-)  Sputum 10/16 >   ANTIBIOTICS: Vanc 10/11 > 10/16 Cefepime 10/11 >  SIGNIFICANT EVENTS: 10/11 > admitted to [facility]. 10/16 > transferred to [facility].  LINES/TUBES: ETT 10/16 >  DISCUSSION: 80 y.o. male with multiple co-morbidities, admitted by [facility] 03/15/2016 with SOB suspected due to HCAP vs pulmonary edema. On 10/16, developed worsening hypoxia along with profound bradycardia requiring atropine, TC pacing, dopamine.  Required intubation due to respiratory  distress and AMS.  Later transferred to [facility] for further evaluation and management.  ASSESSMENT / PLAN:  PULMONARY A: Acute hypoxic respiratory failure - s/p emergent intubation 03/04/16 for AMS, likely related to bradycardia Acute Pulmonary edema. Acute CHF systolic exacerbation Possible HCAP, bibasilar vs atelectasis Hx pulmonary fibrosis, COPD - followed by Dr. Melvyn Novas. Tobacco dependence. P:   Full vent support. Cont diuresis per cards Wean as able. Need to cut down sedation VAP prevention measures. Continue empiric cefepime (vanc stopped 10/16). Budesonide in lieu of preadmission Breo. DuoNebs scheduled / Albuterol PRN. Tobacco cessation counseling once extubated.  CARDIOVASCULAR A:  Cardiogenic shock Acute on Chronic CHF systolic exacerbation, EF 25-30% Pulmonary edema Demand ischemia Afib/A flutter Sinus bradycardia with pauses - s/p atropine and external pacing followed by dobutamine (03/04/16) > resolved P:  Cardiology following. Appreciate input Cont dobutamine. [place] well.  Check CXR in am > may need thoracentesis if with significant effusion.   RENAL A:   AKI Hyponatremia - presumed hypervolemic in setting CHF. P:   Diuretics per cardiology  GASTROINTESTINAL A:   GI prophylaxis. Nutrition. ransaminitis likely from [facility] 2/2 CHF P:   SUP: Pantoprazole. Cont TF   HEMATOLOGIC A:   VTE Prophylaxis. P:  SCD's / heparin. CBC in AM.  INFECTIOUS A:   HCAP. P:   Abx as above (Continue empiric cefepime (vanc stopped 10/16)). Plan to  d/c cefepime after 7 days.  Send sputum culture.  PCT algorithm to limit abx exposure.  ENDOCRINE A:   No acute issues. P:   Monitor glucose on BMP.  NEUROLOGIC A:   Acute encephalopathy. Hx ETOH abuse. P:   Sedation:  Fentanyl gtt / Midazolam PRN. RASS goal: 0 to -1. Daily WUA. Thiamine / Folate.  Hold preadmission mirtazapine, quetiapine, sertraline. D/c zolpidem.  Family updated: Daughter  at bedside and updated.   Interdisciplinary Family Meeting v Palliative Care Meeting:  Due by: 03/11/16.  Critical Care time with this patient today: 30 minutes.  Monica Becton, MD 03/06/2016, 11:55 AM [place] [facility] Pager [phone] After 3 pm or if no answer, call [phone]

## 2016-03-07 ENCOUNTER — Inpatient Hospital Stay (HOSPITAL_COMMUNITY): Payer: Medicare Other

## 2016-03-07 DIAGNOSIS — I5041 Acute combined systolic (congestive) and diastolic (congestive) heart failure: Secondary | ICD-10-CM

## 2016-03-07 LAB — CBC
HEMATOCRIT: 28.2 % — AB (ref 39.0–52.0)
HEMOGLOBIN: 8.9 g/dL — AB (ref 13.0–17.0)
MCH: 31.6 pg (ref 26.0–34.0)
MCHC: 31.6 g/dL (ref 30.0–36.0)
MCV: 100 fL (ref 78.0–100.0)
Platelets: 208 10*3/uL (ref 150–400)
RBC: 2.82 MIL/uL — AB (ref 4.22–5.81)
RDW: 18.7 % — AB (ref 11.5–15.5)
WBC: 8 10*3/uL (ref 4.0–10.5)

## 2016-03-07 LAB — BASIC METABOLIC PANEL
ANION GAP: 7 (ref 5–15)
BUN: 29 mg/dL — AB (ref 6–20)
CHLORIDE: 98 mmol/L — AB (ref 101–111)
CO2: 28 mmol/L (ref 22–32)
Calcium: 8.2 mg/dL — ABNORMAL LOW (ref 8.9–10.3)
Creatinine, Ser: 1.09 mg/dL (ref 0.61–1.24)
GFR calc Af Amer: >60 mL/min (ref >60)
GLUCOSE: 135 mg/dL — AB (ref 65–99)
POTASSIUM: 4.3 mmol/L (ref 3.5–5.1)
SODIUM: 133 mmol/L — AB (ref 135–145)

## 2016-03-07 LAB — CULTURE, RESPIRATORY W GRAM STAIN

## 2016-03-07 LAB — GLUCOSE, CAPILLARY
GLUCOSE-CAPILLARY: 124 mg/dL — AB (ref 65–99)
GLUCOSE-CAPILLARY: 134 mg/dL — AB (ref 65–99)
Glucose-Capillary: 118 mg/dL — ABNORMAL HIGH (ref 65–99)
Glucose-Capillary: 122 mg/dL — ABNORMAL HIGH (ref 65–99)
Glucose-Capillary: 127 mg/dL — ABNORMAL HIGH (ref 65–99)
Glucose-Capillary: 136 mg/dL — ABNORMAL HIGH (ref 65–99)
Glucose-Capillary: 158 mg/dL — ABNORMAL HIGH (ref 65–99)

## 2016-03-07 LAB — MAGNESIUM: MAGNESIUM: 2 mg/dL (ref 1.7–2.4)

## 2016-03-07 LAB — COOXEMETRY PANEL
CARBOXYHEMOGLOBIN: 1.5 % (ref 0.5–1.5)
Methemoglobin: 1 % (ref 0.0–1.5)
O2 SAT: 71.6 %
TOTAL HEMOGLOBIN: 8.7 g/dL — AB (ref 12.0–16.0)

## 2016-03-07 LAB — CULTURE, RESPIRATORY: CULTURE: NORMAL

## 2016-03-07 LAB — PHOSPHORUS: Phosphorus: 2 mg/dL — ABNORMAL LOW (ref 2.5–4.6)

## 2016-03-07 MED ORDER — VITAL AF 1.2 CAL PO LIQD
1000.0000 mL | ORAL | Status: DC
  Administered 2016-03-08 – 2016-03-09 (×3): 1000 mL

## 2016-03-07 MED ORDER — FUROSEMIDE 10 MG/ML IJ SOLN
80.0000 mg | Freq: Every day | INTRAMUSCULAR | Status: DC
  Administered 2016-03-08: 80 mg via INTRAVENOUS
  Filled 2016-03-07: qty 8

## 2016-03-07 MED ORDER — POTASSIUM CHLORIDE 20 MEQ/15ML (10%) PO SOLN
ORAL | Status: AC
  Filled 2016-03-07: qty 30

## 2016-03-07 MED ORDER — DEXTROSE 5 % IV SOLN
2.0000 g | Freq: Two times a day (BID) | INTRAVENOUS | Status: DC
  Filled 2016-03-07 (×2): qty 2

## 2016-03-07 MED ORDER — MIRTAZAPINE 15 MG PO TBDP
15.0000 mg | ORAL_TABLET | Freq: Every day | ORAL | Status: DC
  Administered 2016-03-07 – 2016-03-15 (×9): 15 mg via ORAL
  Filled 2016-03-07 (×12): qty 1

## 2016-03-07 MED ORDER — AMIODARONE HCL 200 MG PO TABS
100.0000 mg | ORAL_TABLET | Freq: Every day | ORAL | Status: DC
  Administered 2016-03-07 – 2016-03-14 (×8): 100 mg via ORAL
  Filled 2016-03-07 (×8): qty 1

## 2016-03-07 NOTE — Progress Notes (Addendum)
Pt gagging as I enter the room. HR jumped up to 150 on the monitor, pt HR irregular. 25 mcg bolus fentanyl given and fentanyl infusion restarted at 50 mcg/min. PA paged for additional orders. Pt calming down at this moment, HR now 104. Will continue to monitor closely.  Addendum.   Bhagat, PA called, RN and restarted PO amio. PO amio given per tube. Will continue to monitor closely.

## 2016-03-07 NOTE — Progress Notes (Signed)
Patient Name: Chad Rogers Date of Encounter: 03/07/2016  Primary Cardiologist: [facility] Problem List     Principal Problem:   Sepsis [facility]) Active Problems:   Hypertension   Pulmonary fibrosis ([place])   Tobacco abuse   Traumatic fracture of ribs with pneumothorax   Multiple rib fractures   Acute on chronic systolic congestive heart failure (HCC)   Atrial fibrillation, persistent (HCC)   Debility   Healthcare-associated pneumonia   Protein-calorie malnutrition, severe   Metabolic acidosis   CHF (congestive heart failure) (HCC)   Endotracheally intubated   Acute encephalopathy   AKI (acute kidney injury) ([place])   Hyperkalemia   Pressure injury of skin   Acute hypoxemic respiratory failure (HCC)   Acute pulmonary edema (HCC)   Acute combined systolic and diastolic heart failure ([place])   Cardiogenic shock (HCC)     Subjective   Intubated and awake, but unable to talk or write.  Denies pain. Wants to be extubated.   Inpatient Medications    Scheduled Meds: . budesonide (PULMICORT) nebulizer solution  0.5 mg Nebulization BID  . ceFEPime (MAXIPIME) IV  2 g Intravenous Q24H  . chlorhexidine gluconate (MEDLINE KIT)  15 mL Mouth Rinse BID  . docusate  100 mg Per Tube Daily  . feeding supplement (PRO-STAT SUGAR FREE 64)  30 mL Per Tube BID  . folic acid  1 mg Per Tube Daily  . furosemide  80 mg Intravenous Q8H  . heparin  5,000 Units Subcutaneous Q8H  . hydrocortisone cream   Topical BID  . insulin aspart  0-9 Units Subcutaneous Q4H  . ipratropium-albuterol  3 mL Nebulization Q6H  . mouth rinse  15 mL Mouth Rinse 10 times per day  . multivitamin with minerals  1 tablet Per Tube Daily  . pantoprazole sodium  40 mg Per Tube Q1200  . sodium chloride flush  10-40 mL Intracatheter Q12H  . thiamine  100 mg Per Tube Daily   Continuous Infusions: . DOBUTamine Stopped (03/05/16 1429)  . DOBUTamine 7.5 mcg/kg/min (03/06/16 2000)  . feeding supplement (VITAL AF 1.2  CAL) 1,000 mL (03/07/16 0636)  . fentaNYL infusion INTRAVENOUS 100 mcg/hr (03/06/16 2100)   PRN Meds: albuterol, fentaNYL, midazolam, midazolam, sodium chloride flush   Vital Signs    Vitals:   03/07/16 0600 03/07/16 0700 03/07/16 0748 03/07/16 0823  BP: (!) 111/54 113/64  104/62  Pulse: (!) 105 (!) 107  (!) 104  Resp: (!) 21 19  16   Temp:   99.3 F (37.4 C)   TempSrc:   Oral   SpO2: 100% 100%  100%  Weight:      Height:        Intake/Output Summary (Last 24 hours) at 03/07/16 0842 Last data filed at 03/07/16 0700  Gross per 24 hour  Intake          1707.17 ml  Output             1175 ml  Net           532.17 ml   Filed Weights   03/04/16 2000 03/06/16 0445 03/07/16 0500  Weight: 73.4 kg (161 lb 13.1 oz) 67 kg (147 lb 11.3 oz) 67.8 kg (149 lb 7.6 oz)    Physical Exam   GEN: Critically ill-appearing and frail.  No acute distress.   HEENT: Grossly normal. Intubated Neck: Supple, no JVD   No carotid bruits, or masses. Cardiac: Irregularly irregular rhythm, no murmurs, rubs, or gallops. No  clubbing, cyanosis, edema.  Radials/DP/PT 2+ and equal bilaterally.  Respiratory:  Vented breath sounds  GI: Soft, nontender, nondistended, BS + x 4. MS: no deformity or atrophy. Skin: warm and dry, no rash. Neuro:  Strength and sensation are intact. Psych: AAOx3.  Normal affect.  Labs    CBC  Recent Labs  03/04/16 1120  03/06/16 0425 03/07/16 0333  WBC 14.9*  < > 7.6 8.0  NEUTROABS 11.4*  --   --   --   HGB 9.7*  < > 9.1* 8.9*  HCT 30.8*  < > 28.5* 28.2*  MCV 102.7*  < > 97.6 100.0  PLT 322  < > 227 208  < > = values in this interval not displayed. Basic Metabolic Panel  Recent Labs  03/06/16 1625 03/06/16 2206 03/07/16 0333  NA 133* 132* 133*  K 5.0 4.2 4.3  CL 97* 98* 98*  CO2 29 28 28   GLUCOSE 120* 166* 135*  BUN 28* 28* 29*  CREATININE 1.25* 1.15 1.09  CALCIUM 8.3* 8.2* 8.2*  MG 2.2  --  2.0  PHOS 2.9  --  2.0*   Liver Function Tests  Recent Labs   03/04/16 2006 03/06/16 0425  AST 507* 137*  ALT 647* 421*  ALKPHOS 119 111  BILITOT 2.1* 1.9*  PROT 5.2* 5.2*  ALBUMIN 2.6* 2.4*   No results for input(s): LIPASE, AMYLASE in the last 72 hours. Cardiac Enzymes  Recent Labs  03/04/16 2006  TROPONINI 0.05*   BNP Invalid input(s): POCBNP D-Dimer No results for input(s): DDIMER in the last 72 hours. Hemoglobin A1C No results for input(s): HGBA1C in the last 72 hours. Fasting Lipid Panel No results for input(s): CHOL, HDL, LDLCALC, TRIG, CHOLHDL, LDLDIRECT in the last 72 hours. Thyroid Function Tests No results for input(s): TSH, T4TOTAL, T3FREE, THYROIDAB in the last 72 hours.  Invalid input(s): FREET3  Telemetry    Atrial fibrillation, PVCs. - Personally Reviewed  ECG    03/04/16: Atypical atrial flutter with variable conduction.  Ventricular rate 51 bpm.  Very wide RBBB.  LAD.  Prior anterior and inferior infarcts.  - Personally Reviewed  Radiology    Ct Chest Wo Contrast  Result Date: 03/05/2016 CLINICAL DATA:  Respiratory failure. EXAM: CT CHEST WITHOUT CONTRAST TECHNIQUE: Multidetector CT imaging of the chest was performed following the standard protocol without IV contrast. COMPARISON:  Chest radiograph 03/05/2016 ; chest CT 12/28/2015. FINDINGS: Cardiovascular: Heart is enlarged. Aortic atherosclerosis. Coronary arterial calcifications. Mediastinum/Nodes: ET tube terminates in the mid trachea. Enteric tube tip terminates in the stomach. No enlarged axillary, mediastinal or hilar lymphadenopathy. Lungs/Pleura: Central airways are patent. Moderate right and small left pleural effusions. Underlying ground-glass and consolidative opacities within the lower lobes bilaterally. No pneumothorax. Multiple small scattered predominately ground-glass pulmonary nodules are demonstrated throughout the aerated upper lungs bilaterally, predominately the right upper lobe (image 27; series 205). Upper Abdomen: High attenuation material  within the gallbladder lumen. Musculoskeletal: Multiple bilateral rib fractures are demonstrated, many with early callus formation. There are multiple displaced rib fractures involving the left fourth-eighth ribs. IMPRESSION: Moderate right and small left pleural effusions. A portion of the superior aspect of the left pleural effusion may be loculated. Underlying consolidation within the lower lobes bilaterally likely represents atelectasis. Infection not excluded. Multiple tiny bilateral ground-glass pulmonary nodules are nonspecific and may be infectious/inflammatory in etiology. After resolution of the acute symptomatology, recommend follow-up chest CT to evaluate for resolution or persistence. Multiple bilateral rib fractures. Many of the lateral  left rib fractures are foreshortened and overlapping. Cardiomegaly. Aortic atherosclerosis. High attenuation in the gallbladder lumen may represent sludge. Electronically Signed   By: Lovey Newcomer M.D.   On: 03/05/2016 17:21   Dg Chest Port 1 View  Result Date: 03/07/2016 CLINICAL DATA:  Respiratory failure. EXAM: PORTABLE CHEST 1 VIEW COMPARISON:  03/05/2016. FINDINGS: Endotracheal tube, NG tube, right PICC line in stable position. Unchanged cardiomegaly. Unchanged bibasilar infiltrates and or edema. Small right pleural effusion. No pneumothorax. IMPRESSION: 1. Lines and tubes in stable position. 2. Cardiomegaly with unchanged bibasilar pulmonary infiltrates and/or edema. Small right pleural effusion again noted. Electronically Signed   By: Marcello Moores  Register   On: 03/07/2016 07:35   Dg Chest Port 1 View  Result Date: 03/05/2016 CLINICAL DATA:  Right PICC line placement EXAM: PORTABLE CHEST 1 VIEW COMPARISON:  Chest CT 03/05/2016, chest radiograph 03/05/2016 FINDINGS: Right PICC line tip overlies the lower SVC. Bilateral pleural effusions and bibasilar opacities are unchanged. The remainder of the support apparatus is unchanged. IMPRESSION: Right approach PICC  line tip overlying the lower SVC. Electronically Signed   By: Ulyses Jarred M.D.   On: 03/05/2016 18:18   Dg Chest Port 1 View  Result Date: 03/05/2016 CLINICAL DATA:  Dislodged endotracheal tube.  ET tube placement. EXAM: PORTABLE CHEST 1 VIEW COMPARISON:  03/05/2016 FINDINGS: Endotracheal tube with the tip 4.5 cm above the carina. Nasogastric tube coursing below the diaphragm. Small bilateral pleural effusions. Diffuse interstitial and alveolar airspace opacities. No pneumothorax. Mild stable cardiomegaly. The osseous structures are unremarkable. IMPRESSION: 1. Endotracheal tube 4.5 cm above the carina. 2. Findings most compatible with CHF. Electronically Signed   By: Kathreen Devoid   On: 03/05/2016 11:58    Cardiac Studies   Echo 01/01/16: Study Conclusions  - Left ventricle: The cavity size was mildly dilated. Wall   thickness was normal. Systolic function was severely reduced. The   estimated ejection fraction was in the range of 25% to 30%.   Diffuse hypokinesis. Indeterminant diastolic function. - Aortic valve: There was no stenosis. - Mitral valve: Mildly calcified annulus. There was no significant   regurgitation. - Left atrium: The atrium was mildly dilated. - Right ventricle: The cavity size was mildly dilated. Systolic   function was mildly reduced. - Right atrium: The atrium was mildly dilated. - Pulmonary arteries: No complete TR doppler jet so unable to   estimate PA systolic pressure. - Systemic veins: IVC measured 2.0 cm with < 50% respirophasic   variation suggesting RA pressure 8 mmHg. .  Impressions:  - Technically difficult study with poor acoustic windows. Mildly   dilated LV with EF 25-30%. Diffuse hypokinesis. Mildly dilated RV   with mildly decreased systolic function. No significant valvular   abnormalities.  Patient Profile     Chad Rogers is a 37M with hypertension, hyperlipidemia, chronic systolic and diastolic heart failure (LVEF 25%) longstanding  persistent atrial fibrillation, pulmonary fibrosis, COPD, NSVT, EtOH abuse and tobacco abuse transferred from [facility] with acute on chronic systolic and diastolic heart failure, bradycardia, and hypoxic respiratory failure requiring intubation.   Chad Rogers had a prolonged hospitalization 12/2015 after a fall with multiple fractures (L ribs, L3-L4, L acetabular and L pneumothorax).  That hospitalization was c/b prolonged intubation and tracheostomy. Echo 01/01/16 revealed LVEF 25-30% with diffuse hypokinesis and mildly reduced RV function.  He was discharged to [facility] and decanulated 02/09/16, discharged home 02/23/16.  Presented to [facility] 03/11/2016 with dyspnea and weakness.  He was hypotensive,  lactic acid of 7 and diagnosed with sepsis.  Sodium was 126, WBC 17K. CXR with LLL infiltrate and effusion. He was treated with IVF for sepsis with development of worsening bilateral (R>L) effusions.  Cardiology was consulted 10/16 for assistance with diuresis.  He became hypotensive with IV diuretics and renal function has worsened. He was started on dobutamine and amiodarone was discontinued due to new bradycardia.  He was noted to have pauses 4-6 seconds and hear rate sustained in th 40s, requiring atropine and external pacing. He became hypotensive with dobutamine, though HR increased to 70-100s.  Patient developed hypoxic respiratory failure requiring intubation.     Assessment & Plan    # Acute systolic and diastolic heart failure: # Cardiogenic shock:   LVEF 25% 12/2015.  Chad Rogers received IV fluids in the setting of sepsis and became volume overloaded.  Dobutamine has been weaned to 7.5 from 17.5 yesterday.  Lasix was held yesterday due to CVP 2-4.  Today renal function and sodium have improved.  CVP this morning is 8-9.  We will give lasix 4m IV x1 today to maintain this CVP.  Mixed venous sats have ranged from 67.2-84.2.  Will stop checking and continue to wean dobutamine as tolerated.    #  Hypokalemia:  Improved with supplementation.  # Bradycardia:  It seems that his bradycardia may be have been secondary to hypoxic respiratory failure and volume overload.  No recurrence, though we will continue to monitor as dobutamine is weaned.   # Hypoxic respiratory failure: Attributable to pneumonia, pleural effusions and acute on chronic heart failure. Vent management per pulm/critical care.  I have asked the nurse to hold sedation this am, as he is not breathing over the vent.    # Longstanding persistent atrial fibrillation:  Rates are well-controlled.  Amiodarone held 2/2 bradycardia.  He is on 1071mdaily at home.  He has not been anticoagulated due to a history of frequent falls.  # Demand ischemia: Troponin mildly elevate due to demand ischemia.  No ischemia evaluation at this time.   # Hyponatremia: Improved after holding lasix yesterday, suggesting there was a component of hypovolemic hyponatremia.  Diuresing to CVP as above.   # AKI: Renal function back at baseline.  Will give one dose of lasix today as above.    Signed, TiSkeet LatchMD  03/07/2016, 8:42 AM

## 2016-03-07 NOTE — Progress Notes (Signed)
Nutrition Follow-up  DOCUMENTATION CODES:   Severe malnutrition in context of acute illness/injury  INTERVENTION:   Increase Vital AF 1.2 @ 55 ml/hr (1320 ml/day) Continue 30 ml Prostat BID Provides: 1786 kcal, 129 grams protein, and 1070 ml H2O. MVI daily  NUTRITION DIAGNOSIS:   Malnutrition (Severe) related to acute illness as evidenced by moderate depletion of body fat, moderate depletions of muscle mass. Ongoing.   GOAL:   Patient will meet greater than or equal to 90% of their needs Met.   MONITOR:   TF tolerance, Skin, I & O's, Labs  ASSESSMENT:   Pt with hx of ETOH abuse and recent prolonged hospitalization after a fall with several rib fractures.  During that admit, he had a prolonged intubation requiring tracheostomy and admission to an LTAC followed by rehab.  The patient was apparently discharged one week prior to presentation.  He reported nausea, vomiting, weakness initially that resolved.  Subsequently, he developed worsening shortness of breath.  Pt transferred to [facility], he was hypotensive, hypoxic and had an elevated lactic acid (7).  Work up concerning for new infiltrate & volume depletion with AKI.   Patient is currently intubated on ventilator support MV: 9.8 L/min Temp (24hrs), Avg:99 F (37.2 C), Min:98 F (36.7 C), Max:100.2 F (37.9 C)  Medications reviewed and include: colace, folvite, lasix, MVI, thiamine Labs reviewed: Na 133, PO4: 2 CBG's: 124-136   Diet Order:  Diet NPO time specified  Skin:  Wound (see comment) (stage I sacrum)  Last BM:  10/10  Height:   Ht Readings from Last 1 Encounters:  03/04/16 _0  (1.778 m)    Weight:   Wt Readings from Last 1 Encounters:  03/07/16 149 lb 7.6 oz (67.8 kg)    Ideal Body Weight:  75 kg  BMI:  Body mass index is 21.45 kg/m.  Estimated Nutritional Needs:   Kcal:  8299  Protein:  105-115 grams  Fluid:  1.6 L/day  EDUCATION NEEDS:   No education needs identified at this  time  [place], [place], [place] Pager [phone] After Hours Pager

## 2016-03-07 NOTE — Progress Notes (Signed)
Pharmacy Antibiotic Note  Chad Rogers is a 80 y.o. male admitted on 03/17/2016 with pneumonia.  Pharmacy has been consulted for cefepime dosing.  Plan: Increase cefepime to 2g IV q12h with improvement in renal function Monitor c/s, clinical progression, renal function and length of therapy  Height: 5\' 10"  (177.8 cm) Weight: 149 lb 7.6 oz (67.8 kg) IBW/kg (Calculated) : 73  Temp (24hrs), Avg:99 F (37.2 C), Min:98 F (36.7 C), Max:100.2 F (37.9 C)   Recent Labs Lab 02/29/16 1247  03/04/16 1120  03/04/16 2006 03/05/16 0327 03/05/16 0757 03/06/16 0425 03/06/16 1625 03/06/16 2206 03/07/16 0333  WBC  --   < > 14.9*  --  9.7 9.2  --  7.6  --   --  8.0  CREATININE  --   < > 2.38*  < > 2.05* 1.97* 1.83* 1.41* 1.25* 1.15 1.09  LATICACIDVEN 2.0*  --   --   --  1.5  --   --   --   --   --   --   < > = values in this interval not displayed.  Estimated Creatinine Clearance: 53.6 mL/min (by C-G formula based on SCr of 1.09 mg/dL).    No Known Allergies  Antimicrobials this admission:  Vancomycin 10/11>>10/16 Cefepime 10/11>>10/15; 10/17>>  Dose adjustments this admission: Cefepime has required adjustment to schedule d/t changes in renal function during admission (10/12-10/14- on 1g q24; 10/14-10/16- on 1g IV q12h; 10/17- restarted at q24h, 10/19- increased to 2g IV q12h)  Microbiology results: 10/11BCx: neg 10/11UCx: neg 10/16 TA: reincubated  Thank you for allowing pharmacy to be a part of this patient's care.  Shizuko Wojdyla D. Krystalynn Ridgeway, PharmD, BCPS Clinical Pharmacist Pager: [phone] 03/07/2016 10:08 AM

## 2016-03-07 NOTE — Progress Notes (Signed)
PULMONARY / CRITICAL CARE MEDICINE   Name: Chad Rogers MRN: [id number] DOB: [birth year]    ADMISSION DATE:  03/14/2016 CONSULTATION DATE:  03/04/16  REFERRING MD:  03/04/16  CHIEF COMPLAINT:  Dr. Wyline Copas   HISTORY OF PRESENT ILLNESS:   80 y/o M, smoker, with PMH of ETOH abuse, allergic rhinitis, psoriasis, skin cancer, osteoarthritis, CAD, HTN, atrial fibrillation (not on anticoagulation, Hx falls), systolic CHF (EF 123456), HLD, pulmonary fibrosis and COPD (previously followed by Dr. Melvyn Novas - 2011) admitted to [facility] on 02/27/2016 complaints of SOB.    The patient recently had a prolonged hospitalization after a fall with several rib fractures.  During that admit, he had a prolonged intubation requiring tracheostomy and admission to an LTAC followed by rehab.  The patient was apparently discharged one week prior to presentation.  He reported nausea, vomiting, weakness initially that resolved.  Subsequently, he developed worsening shortness of breath & DOE.  On admit, he was hypotensive, hypoxic and had an elevated lactic acid (7).  Work up concerning for new infiltrate & volume depletion with AKI.  The patient was admitted per [facility] for further care.  He responded well to IVF's and oxygen initially.  He was later found to have pulmonary edema with associated effusions and treated with Lasix.  Follow up CXR evaluation showed enlarging effusions.  Cardiology was consulted for evaluation.  During their evaluation, he was found to have pronounced bradycardia and pauses.  Atropine was administered and he was started on an external pacemaker.  HR was labile and he was started on dopamine.  He had subsequent respiratory distress with hypoxemia (sats dropped to 70's) with altered level of consciousness and required intubation.  He developed further metabolic derangements (123XX123, CO2 18, Hgb 9.7) 10/16 and was transferred to [facility] for further care.    Current labs - 7.245 / 29 / 90 / 13.7.  Na 121, K  6.3, Cl 94, CO2 18, BUN 39 / Sr Cr 2.38, glucose 109, Mg 2.1, WBC 14.9, Hgb 9.7, MCV 102.7 and platelets 322.  CXR 10/16 with concern for pulmonary edema, layering effusions and left basilar atelectasis.  SUBJECTIVE:   No issues overnight. On less dobutamine. On PST > looks comfortable.    VITAL SIGNS: BP 104/62   Pulse (!) 105   Temp 99.3 F (37.4 C) (Oral)   Resp 16   Ht 5\' 10"  (1.778 m)   Wt 67.8 kg (149 lb 7.6 oz)   SpO2 100%   BMI 21.45 kg/m   HEMODYNAMICS: CVP:  [2 mmHg-8 mmHg] 4 mmHg  VENTILATOR SETTINGS: Vent Mode: PRVC FiO2 (%):  [40 %] 40 % Set Rate:  [16 bmp] 16 bmp Vt Set:  [580 mL] 580 mL PEEP:  [5 cmH20] 5 cmH20 Pressure Support:  [8 cmH20] 8 cmH20 Plateau Pressure:  [13 cmH20-18 cmH20] 15 cmH20  INTAKE / OUTPUT: I/O last 3 completed shifts: In: 2904.7 [I.V.:844.7; NG/GT:1860; IV [id number] Out: 3075 [Urine:3075]  PHYSICAL EXAMINATION: General: Chronically ill appearing male, resting in bed, in NAD. Neuro: Sedated but slightly agitated. Follows commands.  HEENT: [place]/AT. PERRL, sclerae anicteric. Cardiovascular: RRR, no M/R/G.  Lungs: Respirations even and unlabored. Bibasilar crackles.  Abdomen: BS x 4, soft, NT/ND.  Musculoskeletal: No gross deformities, no edema.  Skin: Dry, intact, warm, no rashes.   LABS:  BMET  Recent Labs Lab 03/06/16 1625 03/06/16 2206 03/07/16 0333  NA 133* 132* 133*  K 5.0 4.2 4.3  CL 97* 98* 98*  CO2 29 28  28  BUN 28* 28* 29*  CREATININE 1.25* 1.15 1.09  GLUCOSE 120* 166* 135*    Electrolytes  Recent Labs Lab 03/06/16 0425 03/06/16 1625 03/06/16 2206 03/07/16 0333  CALCIUM 8.3* 8.3* 8.2* 8.2*  MG 1.5* 2.2  --  2.0  PHOS 3.6 2.9  --  2.0*    CBC  Recent Labs Lab 03/05/16 0327 03/06/16 0425 03/07/16 0333  WBC 9.2 7.6 8.0  HGB 9.2* 9.1* 8.9*  HCT 28.4* 28.5* 28.2*  PLT 246 227 208    Coag's No results for input(s): APTT, INR in the last 168 hours.  Sepsis Markers  Recent Labs Lab  02/29/16 1247 03/04/16 2006 03/05/16 0327 03/06/16 0425  LATICACIDVEN 2.0* 1.5  --   --   PROCALCITON  --  0.26 0.32 0.19    ABG  Recent Labs Lab 03/04/16 1815 03/04/16 2119 03/05/16 0458  PHART 7.363 7.356 7.390  PCO2ART 33.3 35.2 36.6  PO2ART 176* 177.0* 151*    Liver Enzymes  Recent Labs Lab 03/01/16 0452 03/04/16 2006 03/06/16 0425  AST 48* 507* 137*  ALT 72* 647* 421*  ALKPHOS 86 119 111  BILITOT 1.2 2.1* 1.9*  ALBUMIN 3.1* 2.6* 2.4*    Cardiac Enzymes  Recent Labs Lab 03/04/16 2006  TROPONINI 0.05*    Glucose  Recent Labs Lab 03/06/16 1207 03/06/16 1627 03/06/16 1952 03/06/16 2355 03/07/16 0342 03/07/16 0747  GLUCAP 186* 92 118* 125* 136* 124*    Imaging Dg Chest Port 1 View  Result Date: 03/07/2016 CLINICAL DATA:  Respiratory failure. EXAM: PORTABLE CHEST 1 VIEW COMPARISON:  03/05/2016. FINDINGS: Endotracheal tube, NG tube, right PICC line in stable position. Unchanged cardiomegaly. Unchanged bibasilar infiltrates and or edema. Small right pleural effusion. No pneumothorax. IMPRESSION: 1. Lines and tubes in stable position. 2. Cardiomegaly with unchanged bibasilar pulmonary infiltrates and/or edema. Small right pleural effusion again noted. Electronically Signed   By: Marcello Moores  Register   On: 03/07/2016 07:35    STUDIES:  CXR 10/16 > mild pulmonary edema with right pleural effusion and left basilar atx.  CULTURES: Blood 10/11 > (-)  Sputum 10/16 >   ANTIBIOTICS: Vanc 10/11 > 10/16 Cefepime 10/11 > 10/19  SIGNIFICANT EVENTS: 10/11 > admitted to [facility]. 10/16 > transferred to [facility].  LINES/TUBES: ETT 10/16 >  DISCUSSION: 80 y.o. male with multiple co-morbidities, admitted by [facility] 03/14/2016 with SOB suspected due to HCAP vs pulmonary edema. On 10/16, developed worsening hypoxia along with profound bradycardia requiring atropine, TC pacing, dopamine.  Required intubation due to respiratory distress and AMS.  Later transferred to [facility] for  further evaluation and management.  ASSESSMENT / PLAN:  PULMONARY A: Acute hypoxic respiratory failure - s/p emergent intubation 03/04/16 for AMS, likely related to bradycardia Acute Pulmonary edema. Acute CHF systolic exacerbation Possible HCAP, bibasilar vs atelectasis. Completed 8days of cefepime (10/19) Hx pulmonary fibrosis, COPD - followed by Dr. Melvyn Novas. Tobacco dependence. P:   Full vent support. Cont diuresis per cards Wean as able. PST.  VAP prevention measures. Will d/c cefepime today. Cultures been (-). CXR more of pulm edema.  Budesonide in lieu of preadmission Breo. DuoNebs scheduled / Albuterol PRN. Tobacco cessation counseling once extubated.  CARDIOVASCULAR A:  Cardiogenic shock Acute on Chronic CHF systolic exacerbation, EF 25-30% Pulmonary edema Demand ischemia Afib/A flutter Sinus bradycardia with pauses - s/p atropine and external pacing followed by dobutamine (03/04/16) > resolved P:  Cardiology following. Appreciate input Cont dobutamine. Cont diuresis   RENAL A:   AKI Hyponatremia -  presumed hypervolemic in setting CHF. P:   Diuretics per cardiology  GASTROINTESTINAL A:   GI prophylaxis. Nutrition. Transaminitis likely from [facility] 2/2 CHF P:   SUP: Pantoprazole. Cont TF   HEMATOLOGIC A:   VTE Prophylaxis. P:  SCD's / heparin. CBC in AM.  INFECTIOUS A:   HCAP. Completed 8d of cefepime.  P:   Observe off Abx. CXR more of pulm edema.   ENDOCRINE A:   No acute issues. P:   Monitor glucose on BMP.  NEUROLOGIC A:   Acute encephalopathy. Hx ETOH abuse. P:   Sedation:  Fentanyl gtt / Midazolam PRN. RASS goal: 0 to -1. Daily WUA. Thiamine / Folate.  Hold preadmission mirtazapine, quetiapine. Start sertraline at HS.  D/c zolpidem.  Family updated: grandaughter at bedside and updated.   Interdisciplinary Family Meeting v Palliative Care Meeting:  Due by: 03/11/16.  Critical Care time with this patient today: 30  minutes.  Monica Becton, MD 03/07/2016, 11:19 AM [facility] Pager [phone] After 3 pm or if no answer, call [phone]

## 2016-03-07 NOTE — Progress Notes (Signed)
Pt placed on wean mode with vent. RN and MD notified.

## 2016-03-07 NOTE — Progress Notes (Signed)
Pt placed back on full vent support due to increased HR > 150.

## 2016-03-08 ENCOUNTER — Inpatient Hospital Stay: Payer: Medicare Other | Admitting: Physical Medicine & Rehabilitation

## 2016-03-08 LAB — BASIC METABOLIC PANEL
ANION GAP: 5 (ref 5–15)
BUN: 31 mg/dL — AB (ref 6–20)
CO2: 31 mmol/L (ref 22–32)
Calcium: 8.5 mg/dL — ABNORMAL LOW (ref 8.9–10.3)
Chloride: 101 mmol/L (ref 101–111)
Creatinine, Ser: 0.79 mg/dL (ref 0.61–1.24)
Glucose, Bld: 118 mg/dL — ABNORMAL HIGH (ref 65–99)
POTASSIUM: 4.4 mmol/L (ref 3.5–5.1)
SODIUM: 137 mmol/L (ref 135–145)

## 2016-03-08 LAB — CBC
HCT: 28.5 % — ABNORMAL LOW (ref 39.0–52.0)
Hemoglobin: 8.5 g/dL — ABNORMAL LOW (ref 13.0–17.0)
MCH: 31.3 pg (ref 26.0–34.0)
MCHC: 29.8 g/dL — ABNORMAL LOW (ref 30.0–36.0)
MCV: 104.8 fL — ABNORMAL HIGH (ref 78.0–100.0)
PLATELETS: 184 10*3/uL (ref 150–400)
RBC: 2.72 MIL/uL — AB (ref 4.22–5.81)
RDW: 18.8 % — ABNORMAL HIGH (ref 11.5–15.5)
WBC: 7.7 10*3/uL (ref 4.0–10.5)

## 2016-03-08 LAB — GLUCOSE, CAPILLARY
GLUCOSE-CAPILLARY: 121 mg/dL — AB (ref 65–99)
GLUCOSE-CAPILLARY: 123 mg/dL — AB (ref 65–99)
Glucose-Capillary: 109 mg/dL — ABNORMAL HIGH (ref 65–99)
Glucose-Capillary: 120 mg/dL — ABNORMAL HIGH (ref 65–99)
Glucose-Capillary: 123 mg/dL — ABNORMAL HIGH (ref 65–99)
Glucose-Capillary: 135 mg/dL — ABNORMAL HIGH (ref 65–99)

## 2016-03-08 LAB — PHOSPHORUS: PHOSPHORUS: 1.6 mg/dL — AB (ref 2.5–4.6)

## 2016-03-08 LAB — MAGNESIUM: MAGNESIUM: 1.9 mg/dL (ref 1.7–2.4)

## 2016-03-08 MED ORDER — FUROSEMIDE 10 MG/ML IJ SOLN
80.0000 mg | Freq: Two times a day (BID) | INTRAMUSCULAR | Status: DC
  Administered 2016-03-08 – 2016-03-09 (×2): 80 mg via INTRAVENOUS
  Filled 2016-03-08 (×2): qty 8

## 2016-03-08 NOTE — Progress Notes (Signed)
PULMONARY / CRITICAL CARE MEDICINE   Name: Chad Rogers MRN: [id number] DOB: 1936/03/11    ADMISSION DATE:  03/14/2016 CONSULTATION DATE:  03/04/16  REFERRING MD:  03/04/16  CHIEF COMPLAINT:  Dr. Wyline Copas   HISTORY OF PRESENT ILLNESS:   80 y/o M, smoker, with PMH of ETOH abuse, allergic rhinitis, psoriasis, skin cancer, osteoarthritis, CAD, HTN, atrial fibrillation (not on anticoagulation, Hx falls), systolic CHF (EF 123456), HLD, pulmonary fibrosis and COPD (previously followed by Dr. Melvyn Novas - 2011) admitted to [facility] on 03/12/2016 complaints of SOB.    The patient recently had a prolonged hospitalization after a fall with several rib fractures.  During that admit, he had a prolonged intubation requiring tracheostomy and admission to an LTAC followed by rehab.  The patient was apparently discharged one week prior to presentation.  He reported nausea, vomiting, weakness initially that resolved.  Subsequently, he developed worsening shortness of breath & DOE.  On admit, he was hypotensive, hypoxic and had an elevated lactic acid (7).  Work up concerning for new infiltrate & volume depletion with AKI.  The patient was admitted per [facility] for further care.  He responded well to IVF's and oxygen initially.  He was later found to have pulmonary edema with associated effusions and treated with Lasix.  Follow up CXR evaluation showed enlarging effusions.  Cardiology was consulted for evaluation.  During their evaluation, he was found to have pronounced bradycardia and pauses.  Atropine was administered and he was started on an external pacemaker.  HR was labile and he was started on dopamine.  He had subsequent respiratory distress with hypoxemia (sats dropped to 70's) with altered level of consciousness and required intubation.  He developed further metabolic derangements (123XX123, CO2 18, Hgb 9.7) 10/16 and was transferred to [facility] for further care.    Current labs - 7.245 / 29 / 90 / 13.7.  Na 121, K  6.3, Cl 94, CO2 18, BUN 39 / Sr Cr 2.38, glucose 109, Mg 2.1, WBC 14.9, Hgb 9.7, MCV 102.7 and platelets 322.  CXR 10/16 with concern for pulmonary edema, layering effusions and left basilar atelectasis.  SUBJECTIVE:   Dobutamine being weaned off.  Doing PST   VITAL SIGNS: BP 106/72   Pulse (!) 104   Temp 97.5 F (36.4 C) (Oral)   Resp 15   Ht 5\' 10"  (1.778 m)   Wt 67 kg (147 lb 11.3 oz)   SpO2 100%   BMI 21.19 kg/m   HEMODYNAMICS: CVP:  [6 mmHg] 6 mmHg  VENTILATOR SETTINGS: Vent Mode: PRVC FiO2 (%):  [40 %] 40 % Set Rate:  [16 bmp] 16 bmp Vt Set:  [580 mL] 580 mL PEEP:  [5 cmH20] 5 cmH20 Plateau Pressure:  [17 cmH20-18 cmH20] 17 cmH20  INTAKE / OUTPUT: I/O last 3 completed shifts: In: 2815.2 [I.V.:487.4; NG/GT:2327.8] Out: 2300 [Urine:2300]  PHYSICAL EXAMINATION: General: Chronically ill appearing male, resting in bed, in NAD. Neuro: Sedated but slightly agitated. Follows commands.  HEENT: [place]/AT. PERRL, sclerae anicteric. Cardiovascular: RRR, no M/R/G.  Lungs: Respirations even and unlabored. Bibasilar crackles.  Abdomen: BS x 4, soft, NT/ND.  Musculoskeletal: No gross deformities, trace edema.  Skin: Dry, intact, warm, no rashes.   LABS:  BMET  Recent Labs Lab 03/06/16 2206 03/07/16 0333 03/08/16 0500  NA 132* 133* 137  K 4.2 4.3 4.4  CL 98* 98* 101  CO2 28 28 31   BUN 28* 29* 31*  CREATININE 1.15 1.09 0.79  GLUCOSE 166* 135*  118*    Electrolytes  Recent Labs Lab 03/06/16 1625 03/06/16 2206 03/07/16 0333 03/08/16 0500  CALCIUM 8.3* 8.2* 8.2* 8.5*  MG 2.2  --  2.0 1.9  PHOS 2.9  --  2.0* 1.6*    CBC  Recent Labs Lab 03/06/16 0425 03/07/16 0333 03/08/16 0500  WBC 7.6 8.0 7.7  HGB 9.1* 8.9* 8.5*  HCT 28.5* 28.2* 28.5*  PLT 227 208 184    Coag's No results for input(s): APTT, INR in the last 168 hours.  Sepsis Markers  Recent Labs Lab 03/04/16 2006 03/05/16 0327 03/06/16 0425  LATICACIDVEN 1.5  --   --   PROCALCITON  0.26 0.32 0.19    ABG  Recent Labs Lab 03/04/16 1815 03/04/16 2119 03/05/16 0458  PHART 7.363 7.356 7.390  PCO2ART 33.3 35.2 36.6  PO2ART 176* 177.0* 151*    Liver Enzymes  Recent Labs Lab 03/04/16 2006 03/06/16 0425  AST 507* 137*  ALT 647* 421*  ALKPHOS 119 111  BILITOT 2.1* 1.9*  ALBUMIN 2.6* 2.4*    Cardiac Enzymes  Recent Labs Lab 03/04/16 2006  TROPONINI 0.05*    Glucose  Recent Labs Lab 03/07/16 1541 03/07/16 1955 03/07/16 2344 03/08/16 0421 03/08/16 0755 03/08/16 1239  GLUCAP 134* 158* 122* 121* 135* 123*    Imaging No results found.  STUDIES:  CXR 10/16 > mild pulmonary edema with right pleural effusion and left basilar atx.  CULTURES: Blood 10/11 > (-)  Sputum 10/16 > (-)  ANTIBIOTICS: Vanc 10/11 > 10/16 Cefepime 10/11 > 10/19  SIGNIFICANT EVENTS: 10/11 > admitted to [facility]. 10/16 > transferred to [facility].  LINES/TUBES: ETT 10/16 >  DISCUSSION: 80 y.o. male with multiple co-morbidities, admitted by [facility] 03/06/2016 with SOB suspected due to HCAP vs pulmonary edema. On 10/16, developed worsening hypoxia along with profound bradycardia requiring atropine, TC pacing, dopamine.  Required intubation due to respiratory distress and AMS.  Later transferred to [facility] for further evaluation and management. Diurescing, weaning.   ASSESSMENT / PLAN:  PULMONARY A: Acute hypoxic respiratory failure - s/p emergent intubation 03/04/16 for AMS, likely related to bradycardia Acute Pulmonary edema. Acute CHF systolic exacerbation > clinically improved Possible HCAP, bibasilar vs atelectasis. Completed 8days of cefepime (10/19) Hx pulmonary fibrosis, COPD - followed by Dr. Melvyn Novas. Tobacco dependence. P:   Full vent support. Daily wean. If he continues to improve, suspect, we can extubate  in 1-2 days. Main barrier to extubation  is pulm edema Cont diuresis per cards VAP prevention measures. Budesonide in lieu of preadmission Breo. DuoNebs scheduled /  Albuterol PRN. Tobacco cessation counseling once extubated.  CARDIOVASCULAR A:  Cardiogenic shock, improved Acute on Chronic CHF systolic exacerbation, EF 25-30% Pulmonary edema Demand ischemia Afib/A flutter Sinus bradycardia with pauses - s/p atropine and external pacing followed by dobutamine (03/04/16) > resolved P:  Cardiology following. Appreciate input Cont dobutamine. Cont diuresis   RENAL A:   AKI Hyponatremia - presumed hypervolemic in setting CHF. P:   Diuretics per cardiology  GASTROINTESTINAL A:   GI prophylaxis. Nutrition. Transaminitis likely from [facility] 2/2 CHF P:   SUP: Pantoprazole. Cont TF   HEMATOLOGIC A:   VTE Prophylaxis. P:  SCD's / heparin. CBC in AM.  INFECTIOUS A:   HCAP. Completed 8d of cefepime.  P:   Observe off Abx. CXR more of pulm edema.   ENDOCRINE A:   No acute issues. P:   Monitor glucose on BMP.  NEUROLOGIC A:   Acute encephalopathy. Hx ETOH abuse. P:  Sedation:  Fentanyl gtt / Midazolam PRN. RASS goal: 0 to -1. Daily WUA. Thiamine / Folate.  Hold preadmission mirtazapine, quetiapine. Start sertraline at HS.  D/c zolpidem.  Family updated: grandaughter at bedside and updated on 10/19. No family at bedside on 10/20.   Interdisciplinary Family Meeting v Palliative Care Meeting:  Due by: 03/11/16.  Critical Care time with this patient today: 30 minutes.  Monica Becton, MD 03/08/2016, 12:43 PM [place] [facility] Pager [phone] After 3 pm or if no answer, call [phone]

## 2016-03-08 NOTE — Progress Notes (Signed)
Patient Name: Chad Rogers Date of Encounter: 03/08/2016  Primary Cardiologist: [facility] Problem List     Principal Problem:   Sepsis [facility]) Active Problems:   Hypertension   Pulmonary fibrosis ([place])   Tobacco abuse   Traumatic fracture of ribs with pneumothorax   Multiple rib fractures   Acute on chronic systolic congestive heart failure (HCC)   Atrial fibrillation, persistent ([place])   Debility   HCAP (healthcare-associated pneumonia)   Protein-calorie malnutrition, severe   Metabolic acidosis   CHF (congestive heart failure) (HCC)   Endotracheally intubated   Acute encephalopathy   AKI (acute kidney injury) ([place])   Hyperkalemia   Pressure injury of skin   Acute hypoxemic respiratory failure (HCC)   Acute pulmonary edema (HCC)   Acute combined systolic and diastolic heart failure ([place])   Cardiogenic shock (HCC)     Subjective   Intubated and sedated.   Inpatient Medications    Scheduled Meds: . amiodarone  100 mg Oral Daily  . budesonide (PULMICORT) nebulizer solution  0.5 mg Nebulization BID  . chlorhexidine gluconate (MEDLINE KIT)  15 mL Mouth Rinse BID  . docusate  100 mg Per Tube Daily  . feeding supplement (PRO-STAT SUGAR FREE 64)  30 mL Per Tube BID  . folic acid  1 mg Per Tube Daily  . furosemide  80 mg Intravenous Daily  . heparin  5,000 Units Subcutaneous Q8H  . hydrocortisone cream   Topical BID  . insulin aspart  0-9 Units Subcutaneous Q4H  . ipratropium-albuterol  3 mL Nebulization Q6H  . mouth rinse  15 mL Mouth Rinse 10 times per day  . mirtazapine  15 mg Oral QHS  . multivitamin with minerals  1 tablet Per Tube Daily  . pantoprazole sodium  40 mg Per Tube Q1200  . sodium chloride flush  10-40 mL Intracatheter Q12H  . thiamine  100 mg Per Tube Daily   Continuous Infusions: . DOBUTamine 2.543 mcg/kg/min (03/08/16 1100)  . feeding supplement (VITAL AF 1.2 CAL) 1,000 mL (03/08/16 1100)  . fentaNYL infusion INTRAVENOUS 25 mcg/hr  (03/08/16 1100)   PRN Meds: albuterol, fentaNYL, midazolam, midazolam, sodium chloride flush   Vital Signs    Vitals:   03/08/16 1100 03/08/16 1200 03/08/16 1215 03/08/16 1300  BP: 106/72 103/65 103/65 112/73  Pulse: (!) 104 (!) 104 (!) 103 (!) 103  Resp: 15 19 (!) 26 (!) 23  Temp:      TempSrc:      SpO2: 100% 100% 100% 100%  Weight:      Height:        Intake/Output Summary (Last 24 hours) at 03/08/16 1331 Last data filed at 03/08/16 1100  Gross per 24 hour  Intake           1823.9 ml  Output             1275 ml  Net            548.9 ml   Filed Weights   03/06/16 0445 03/07/16 0500 03/08/16 0146  Weight: 67 kg (147 lb 11.3 oz) 67.8 kg (149 lb 7.6 oz) 67 kg (147 lb 11.3 oz)    Physical Exam   GEN: Critically ill-appearing and frail.  No acute distress.   HEENT: Grossly normal. Intubated Neck: Supple, no JVD   No carotid bruits, or masses. Cardiac: RRR.no murmurs, rubs, or gallops. No clubbing, cyanosis, edema.  Radials/DP/PT 2+ and equal bilaterally.  Respiratory:  Vented breath sounds  GI: Soft, nontender, nondistended, BS + x 4. MS: no deformity or atrophy. Skin: warm and dry, no rash. Neuro:  Strength and sensation are intact. Psych: AAOx3.  Normal affect.  Labs    CBC  Recent Labs  03/07/16 0333 03/08/16 0500  WBC 8.0 7.7  HGB 8.9* 8.5*  HCT 28.2* 28.5*  MCV 100.0 104.8*  PLT 208 300   Basic Metabolic Panel  Recent Labs  03/07/16 0333 03/08/16 0500  NA 133* 137  K 4.3 4.4  CL 98* 101  CO2 28 31  GLUCOSE 135* 118*  BUN 29* 31*  CREATININE 1.09 0.79  CALCIUM 8.2* 8.5*  MG 2.0 1.9  PHOS 2.0* 1.6*   Liver Function Tests  Recent Labs  03/06/16 0425  AST 137*  ALT 421*  ALKPHOS 111  BILITOT 1.9*  PROT 5.2*  ALBUMIN 2.4*   No results for input(s): LIPASE, AMYLASE in the last 72 hours. Cardiac Enzymes No results for input(s): CKTOTAL, CKMB, CKMBINDEX, TROPONINI in the last 72 hours. BNP Invalid input(s): POCBNP D-Dimer No  results for input(s): DDIMER in the last 72 hours. Hemoglobin A1C No results for input(s): HGBA1C in the last 72 hours. Fasting Lipid Panel No results for input(s): CHOL, HDL, LDLCALC, TRIG, CHOLHDL, LDLDIRECT in the last 72 hours. Thyroid Function Tests No results for input(s): TSH, T4TOTAL, T3FREE, THYROIDAB in the last 72 hours.  Invalid input(s): FREET3  Telemetry    Atrial fibrillation, PVCs. - Personally Reviewed  ECG    03/04/16: Atypical atrial flutter with variable conduction.  Ventricular rate 51 bpm.  Very wide RBBB.  LAD.  Prior anterior and inferior infarcts.  - Personally Reviewed  Radiology    Dg Chest Port 1 View  Result Date: 03/07/2016 CLINICAL DATA:  Respiratory failure. EXAM: PORTABLE CHEST 1 VIEW COMPARISON:  03/05/2016. FINDINGS: Endotracheal tube, NG tube, right PICC line in stable position. Unchanged cardiomegaly. Unchanged bibasilar infiltrates and or edema. Small right pleural effusion. No pneumothorax. IMPRESSION: 1. Lines and tubes in stable position. 2. Cardiomegaly with unchanged bibasilar pulmonary infiltrates and/or edema. Small right pleural effusion again noted. Electronically Signed   By: Marcello Moores  Register   On: 03/07/2016 07:35    Cardiac Studies   Echo 01/01/16: Study Conclusions  - Left ventricle: The cavity size was mildly dilated. Wall   thickness was normal. Systolic function was severely reduced. The   estimated ejection fraction was in the range of 25% to 30%.   Diffuse hypokinesis. Indeterminant diastolic function. - Aortic valve: There was no stenosis. - Mitral valve: Mildly calcified annulus. There was no significant   regurgitation. - Left atrium: The atrium was mildly dilated. - Right ventricle: The cavity size was mildly dilated. Systolic   function was mildly reduced. - Right atrium: The atrium was mildly dilated. - Pulmonary arteries: No complete TR doppler jet so unable to   estimate PA systolic pressure. - Systemic veins:  IVC measured 2.0 cm with < 50% respirophasic   variation suggesting RA pressure 8 mmHg. .  Impressions:  - Technically difficult study with poor acoustic windows. Mildly   dilated LV with EF 25-30%. Diffuse hypokinesis. Mildly dilated RV   with mildly decreased systolic function. No significant valvular   abnormalities.  Patient Profile     Mr. Gros is a 62M with hypertension, hyperlipidemia, chronic systolic and diastolic heart failure (LVEF 25%) longstanding persistent atrial fibrillation, pulmonary fibrosis, COPD, NSVT, EtOH abuse and tobacco abuse transferred from [facility] with acute on chronic systolic and diastolic  heart failure, bradycardia, and hypoxic respiratory failure requiring intubation.   Mr. Gerry had a prolonged hospitalization 12/2015 after a fall with multiple fractures (L ribs, L3-L4, L acetabular and L pneumothorax).  That hospitalization was c/b prolonged intubation and tracheostomy. Echo 01/01/16 revealed LVEF 25-30% with diffuse hypokinesis and mildly reduced RV function.  He was discharged to [facility] and decanulated 02/09/16, discharged home 02/23/16.  Presented to [facility] 03/14/2016 with dyspnea and weakness.  He was hypotensive, lactic acid of 7 and diagnosed with sepsis.  Sodium was 126, WBC 17K. CXR with LLL infiltrate and effusion. He was treated with IVF for sepsis with development of worsening bilateral (R>L) effusions.  Cardiology was consulted 10/16 for assistance with diuresis.  He became hypotensive with IV diuretics and renal function has worsened. He was started on dobutamine and amiodarone was discontinued due to new bradycardia.  He was noted to have pauses 4-6 seconds and hear rate sustained in th 40s, requiring atropine and external pacing. He became hypotensive with dobutamine, though HR increased to 70-100s.  Patient developed hypoxic respiratory failure requiring intubation.     Assessment & Plan    # Acute systolic and diastolic heart failure: #  Cardiogenic shock:  LVEF 25% 12/2015.  Mr. Commons received IV fluids in the setting of sepsis and became volume overloaded.  Dobutamine requirement continues to improve.  He is down to 2.35mg/kg/min today.  Lasix was resumed at 80 mg daily yesterday.  Will increase to 884mBID given improvement in renal function.  He was net positive 228 yesterday.  His CXR continues to have pulmonary edema, though CVP has been 2-6 and he has no evidence of lower extremity edema.  Mixed venous sats have ranged from 67.2-84.2.    # Hypokalemia:  Improved with supplementation.  # Bradycardia:  It seems that his bradycardia may be have been secondary to hypoxic respiratory failure and volume overload.  No recurrence, though we will continue to monitor as dobutamine is weaned.   # Hypoxic respiratory failure: Attributable to pneumonia, pleural effusions and acute on chronic heart failure. Vent management per pulm/critical care.    # Longstanding persistent atrial fibrillation:  Heart rate increased to the 140s yesterday during extubation attempt.  Amiodarone was initially held 2/2 bradycardia and restarted 10/19.  Rates have been stable since he was re-intubated.  He has not been anticoagulated due to a history of frequent falls.  # Demand ischemia: Troponin mildly elevate due to demand ischemia.  No ischemia evaluation at this time.   # Hyponatremia: Resolved.   # AKI: Renal function back at baseline.  Diuresing as above.    Signed, TiSkeet LatchMD  03/08/2016, 1:31 PM

## 2016-03-09 DIAGNOSIS — Z978 Presence of other specified devices: Secondary | ICD-10-CM

## 2016-03-09 LAB — CBC
HEMATOCRIT: 29.8 % — AB (ref 39.0–52.0)
Hemoglobin: 9 g/dL — ABNORMAL LOW (ref 13.0–17.0)
MCH: 31.6 pg (ref 26.0–34.0)
MCHC: 30.2 g/dL (ref 30.0–36.0)
MCV: 104.6 fL — AB (ref 78.0–100.0)
PLATELETS: 188 10*3/uL (ref 150–400)
RBC: 2.85 MIL/uL — AB (ref 4.22–5.81)
RDW: 18.6 % — ABNORMAL HIGH (ref 11.5–15.5)
WBC: 8.3 10*3/uL (ref 4.0–10.5)

## 2016-03-09 LAB — BASIC METABOLIC PANEL
Anion gap: 7 (ref 5–15)
BUN: 34 mg/dL — AB (ref 6–20)
CO2: 35 mmol/L — AB (ref 22–32)
Calcium: 8.8 mg/dL — ABNORMAL LOW (ref 8.9–10.3)
Chloride: 97 mmol/L — ABNORMAL LOW (ref 101–111)
Creatinine, Ser: 0.77 mg/dL (ref 0.61–1.24)
GFR calc Af Amer: >60 mL/min (ref >60)
GLUCOSE: 121 mg/dL — AB (ref 65–99)
POTASSIUM: 3.9 mmol/L (ref 3.5–5.1)
Sodium: 139 mmol/L (ref 135–145)

## 2016-03-09 LAB — GLUCOSE, CAPILLARY
GLUCOSE-CAPILLARY: 124 mg/dL — AB (ref 65–99)
GLUCOSE-CAPILLARY: 123 mg/dL — AB (ref 65–99)
GLUCOSE-CAPILLARY: 134 mg/dL — AB (ref 65–99)
GLUCOSE-CAPILLARY: 115 mg/dL — AB (ref 65–99)
Glucose-Capillary: 118 mg/dL — ABNORMAL HIGH (ref 65–99)
Glucose-Capillary: 131 mg/dL — ABNORMAL HIGH (ref 65–99)

## 2016-03-09 LAB — PHOSPHORUS: Phosphorus: 2.2 mg/dL — ABNORMAL LOW (ref 2.5–4.6)

## 2016-03-09 LAB — MAGNESIUM: Magnesium: 1.8 mg/dL (ref 1.7–2.4)

## 2016-03-09 MED ORDER — FUROSEMIDE 10 MG/ML IJ SOLN
40.0000 mg | Freq: Two times a day (BID) | INTRAMUSCULAR | Status: DC
  Administered 2016-03-09 – 2016-03-11 (×4): 40 mg via INTRAVENOUS
  Filled 2016-03-09 (×4): qty 4

## 2016-03-09 MED ORDER — MAGNESIUM SULFATE 2 GM/50ML IV SOLN
2.0000 g | Freq: Once | INTRAVENOUS | Status: AC
  Administered 2016-03-09: 2 g via INTRAVENOUS
  Filled 2016-03-09: qty 50

## 2016-03-09 MED ORDER — FENTANYL BOLUS VIA INFUSION
50.0000 ug | INTRAVENOUS | Status: DC | PRN
  Filled 2016-03-09: qty 200

## 2016-03-09 MED ORDER — POTASSIUM PHOSPHATES 15 MMOLE/5ML IV SOLN
10.0000 mmol | Freq: Once | INTRAVENOUS | Status: AC
  Administered 2016-03-09: 10 mmol via INTRAVENOUS
  Filled 2016-03-09: qty 3.33

## 2016-03-09 NOTE — Progress Notes (Signed)
200 cc fentanyl drip wasted in sink followed by water flush by 2 RN.

## 2016-03-09 NOTE — Progress Notes (Signed)
PULMONARY / CRITICAL CARE MEDICINE   Name: Chad Rogers MRN: [id number] DOB: 1935/12/06    ADMISSION DATE:  03/12/2016 CONSULTATION DATE:  03/04/16  REFERRING MD:  03/04/16  CHIEF COMPLAINT:  Dr. Wyline Copas   brief 80 y/o M, smoker, with PMH of ETOH abuse, allergic rhinitis, psoriasis, skin cancer, osteoarthritis, CAD, HTN, atrial fibrillation (not on anticoagulation, Hx falls), systolic CHF (EF [id number]), HLD, pulmonary fibrosis and COPD (previously followed by Dr. Melvyn Novas - 2011) admitted to [facility] on 03/15/2016 complaints of SOB.    The patient recently had a prolonged hospitalization after a fall with several rib fractures.  During that admit, he had a prolonged intubation requiring tracheostomy and admission to an LTAC followed by rehab.  The patient was apparently discharged one week prior to presentation.  He reported nausea, vomiting, weakness initially that resolved.  Subsequently, he developed worsening shortness of breath & DOE.  On admit, he was hypotensive, hypoxic and had an elevated lactic acid (7).  Work up concerning for new infiltrate & volume depletion with AKI.  The patient was admitted per [facility] for further care.  He responded well to IVF's and oxygen initially.  He was later found to have pulmonary edema with associated effusions and treated with Lasix.  Follow up CXR evaluation showed enlarging effusions.  Cardiology was consulted for evaluation.  During their evaluation, he was found to have pronounced bradycardia and pauses.  Atropine was administered and he was started on an external pacemaker.  HR was labile and he was started on dopamine.  He had subsequent respiratory distress with hypoxemia (sats dropped to 70's) with altered level of consciousness and required intubation.  He developed further metabolic derangements (123XX123, CO2 18, Hgb 9.7) 10/16 and was transferred to [facility] for further care.    Current labs - 7.245 / 29 / 90 / 13.7.  Na 121, K 6.3, Cl 94, CO2 18, BUN 39  / Sr Cr 2.38, glucose 109, Mg 2.1, WBC 14.9, Hgb 9.7, MCV 102.7 and platelets 322.  CXR 10/16 with concern for pulmonary edema, layering effusions and left basilar atelectasis.   STUDIES:  CXR 10/16 > mild pulmonary edema with right pleural effusion and left basilar atx.  CULTURES: Blood 10/11 > (-)  Sputum 10/16 > (-)  ANTIBIOTICS: Vanc 10/11 > 10/16 Cefepime 10/11 > 10/19   LINES/TUBES: ETT 10/16 >    SIGNIFICANT EVENTS: 10/11 > admitted to [facility]. 10/16 > transferred to [facility]. 10/17 ef 25%; baseline 03/08/16 -   Dobutamine being weaned off.  Doing PST   SUBJECTIVE/OVERNIGHT/INTERVAL HX 03/09/16 - on PRVC . FOllows all command. Watching TV. Looks dry. CAM-ICU neg for dlirium. Moves all 4s. Cards  Has deceased lasix. Renal function stable. OFf dobutamine. Noted to be weaning earlier in day from vent but per RN failed SBT with resp distress and tachycarida - NSVtach   VITAL SIGNS: BP 92/68   Pulse (!) 110   Temp 98.3 F (36.8 C) (Oral)   Resp 19   Ht 5\' 10"  (1.778 m)   Wt 67.4 kg (148 lb 9.4 oz)   SpO2 99%   BMI 21.32 kg/m   HEMODYNAMICS:    VENTILATOR SETTINGS: Vent Mode: PRVC FiO2 (%):  [30 %-40 %] 30 % Set Rate:  [16 bmp] 16 bmp Vt Set:  [580 mL] 580 mL PEEP:  [5 cmH20] 5 cmH20 Pressure Support:  [5 cmH20] 5 cmH20 Plateau Pressure:  [14 cmH20-26 cmH20] 25 cmH20  INTAKE / OUTPUT: I/O last 3  completed shifts: In: 2660 [I.V.:300; NG/GT:2360] Out: 3950 [Urine:3950]  PHYSICAL EXAMINATION: General: Chronically ill appearing male, resting in bed, in NAD. Think and deconditioned and malnoursihjed Neuro: RASS 0. Watching TV. CAM-ICU neg for delirium Follows commands.  HEENT: [place]/AT. PERRL, sclerae anicteric. Cardiovascular: RRR, no M/R/G.  Lungs: Respirations even and unlabored. Bibasilar crackles.  Abdomen: BS x 4, soft, NT/ND.  Musculoskeletal: No gross deformities, trace edema.  Skin: Dry, intact, warm, no rashes.   LABS:  PULMONARY  Recent  Labs Lab 03/04/16 1155 03/04/16 1815 03/04/16 2119 03/05/16 0458 03/05/16 2017 03/06/16 0525 03/06/16 1610 03/07/16 0335  PHART 7.245* 7.363 7.356 7.390  --   --   --   --   PCO2ART 29.0* 33.3 35.2 36.6  --   --   --   --   PO2ART 90.6 176* 177.0* 151*  --   --   --   --   HCO3 13.7* 19.7* 19.9* 21.8  --   --   --   --   TCO2  --   --  21  --   --   --   --   --   O2SAT 94.4 99.5 100.0 99.1 74.8 67.2 84.2 71.6    CBC  Recent Labs Lab 03/07/16 0333 03/08/16 0500 03/09/16 0528  HGB 8.9* 8.5* 9.0*  HCT 28.2* 28.5* 29.8*  WBC 8.0 7.7 8.3  PLT 208 184 188    COAGULATION No results for input(s): INR in the last 168 hours.  CARDIAC   Recent Labs Lab 03/04/16 2006  TROPONINI 0.05*   No results for input(s): PROBNP in the last 168 hours.   CHEMISTRY  Recent Labs Lab 03/06/16 0425 03/06/16 1625 03/06/16 2206 03/07/16 0333 03/08/16 0500 03/09/16 0528  NA 128* 133* 132* 133* 137 139  K 2.7* 5.0 4.2 4.3 4.4 3.9  CL 91* 97* 98* 98* 101 97*  CO2 27 29 28 28 31  35*  GLUCOSE 180* 120* 166* 135* 118* 121*  BUN 26* 28* 28* 29* 31* 34*  CREATININE 1.41* 1.25* 1.15 1.09 0.79 0.77  CALCIUM 8.3* 8.3* 8.2* 8.2* 8.5* 8.8*  MG 1.5* 2.2  --  2.0 1.9 1.8  PHOS 3.6 2.9  --  2.0* 1.6* 2.2*   Estimated Creatinine Clearance: 72.5 mL/min (by C-G formula based on SCr of 0.77 mg/dL).   LIVER  Recent Labs Lab 03/04/16 2006 03/06/16 0425  AST 507* 137*  ALT 647* 421*  ALKPHOS 119 111  BILITOT 2.1* 1.9*  PROT 5.2* 5.2*  ALBUMIN 2.6* 2.4*     INFECTIOUS  Recent Labs Lab 03/04/16 2006 03/05/16 0327 03/06/16 0425  LATICACIDVEN 1.5  --   --   PROCALCITON 0.26 0.32 0.19     ENDOCRINE CBG (last 3)   Recent Labs  03/09/16 0908 03/09/16 1310 03/09/16 1557  GLUCAP 123* 134* 115*         IMAGING x48h  - image(s) personally visualized  -   highlighted in bold No results found.      Imaging No results found. DISCUSSION: 80 y.o. male with multiple  co-morbidities, admitted by [facility] 02/18/2016 with SOB suspected due to HCAP vs pulmonary edema. On 10/16, developed worsening hypoxia along with profound bradycardia requiring atropine, TC pacing, dopamine.  Required intubation due to respiratory distress and AMS.  Later transferred to [facility] for further evaluation and management. Diurescing, weaning.   ASSESSMENT / PLAN:  PULMONARY A: Acute hypoxic respiratory failure - s/p emergent intubation 03/04/16 for AMS, likely related to  bradycardia Acute Pulmonary edema. Acute CHF systolic exacerbation > clinically improved Possible HCAP, bibasilar vs atelectasis. Completed 8days of cefepime (10/19) Hx pulmonary fibrosis, COPD - followed by Dr. Melvyn Novas. Tobacco dependence.  - failed SBT 03/09/2016 - los 10 days . High risk for Prolonged mech ventilation given recent LTAC, copd, systolic chf. SBT results in NS Vtach  P:   Full vent support -> might be heading to trach.  Daily wean. Cont diuresis per cards VAP prevention measures. Budesonide in lieu of preadmission Breo. DuoNebs scheduled / Albuterol PRN. Tobacco cessation counseling once extubated.  CARDIOVASCULAR A:  Cardiogenic shock, improved Acute on Chronic CHF systolic exacerbation, EF 25-30% Pulmonary edema Demand ischemia Afib/A flutter Sinus bradycardia with pauses - s/p atropine and external pacing followed by dobutamine (03/04/16) > resolved  - easy NS Vtach with SBT.   P:  Cardiology following. Appreciate input Cont diuresis Keep K and mag and phos - optimal  RENAL A:   AKI Hyponatremia - presumed hypervolemic in setting CHF.   - improved creat but has very mild Hypokalemia Hypomagnesemia Hypophosphatemia  P:   Diuretics per cardiology Replete mag - goal > 3 Replete k - goal > 4 Replete phios - goal  > 2.4  GASTROINTESTINAL A:   GI prophylaxis. Nutrition. Transaminitis likely from [facility] 2/2 CHF P:   SUP: Pantoprazole. Cont TF   HEMATOLOGIC A:   VTE  Prophylaxis. P:  SCD's / heparin. CBC in AM.  INFECTIOUS A:   HCAP. Completed 8d of cefepime.  P:   Observe off Abx. CXR more of pulm edema.   ENDOCRINE A:   No acute issues. P:   Monitor glucose on BMP.  NEUROLOGIC A:   Acute encephalopathy. Hx ETOH abuse.   - normal orientation 03/09/16 on sedation gtt  P:   Sedation: dc  Fentanyl gtt Start fent prn Continue versed Midazolam PRN. Can consider precedex RASS goal: 0 to -1. Daily WUA. Thiamine / Folate.  Hold preadmission mirtazapine, quetiapine. Start sertraline at [facility] 03/08/16. - monitor Qtc D/c zolpidem.  Family updated: grandaughter at bedside and updated on 10/19. No family at bedside on 10/20. Or 03/09/16  Interdisciplinary Family Meeting v Palliative Care Meeting:  Due by: 03/11/16.     The patient is critically ill with multiple organ systems failure and requires high complexity decision making for assessment and support, frequent evaluation and titration of therapies, application of advanced monitoring technologies and extensive interpretation of multiple databases.   Critical Care Time devoted to patient care services described in this note is  30  Minutes. This time reflects time of care of this signee Dr Brand Males. This critical care time does not reflect procedure time, or teaching time or supervisory time of PA/NP/Med student/Med Resident etc but could involve care discussion time    Dr. Brand Males, M.D., [facility].C.P Pulmonary and Critical Care Medicine Staff Physician [facility] Pager: [phone], If no answer or between  15:00h - 7:00h: call [phone]  03/09/2016 4:08 PM

## 2016-03-09 NOTE — Progress Notes (Addendum)
Patient Name: Chad Rogers Date of Encounter: 03/09/2016  Primary Cardiologist: Dr. Ladonna Snide Problem List     Principal Problem:   Sepsis [facility]) Active Problems:   Hypertension   Pulmonary fibrosis ([place])   Tobacco abuse   Traumatic fracture of ribs with pneumothorax   Multiple rib fractures   Acute on chronic systolic congestive heart failure (HCC)   Atrial fibrillation, persistent ([place])   Debility   HCAP (healthcare-associated pneumonia)   Protein-calorie malnutrition, severe   Metabolic acidosis   CHF (congestive heart failure) (HCC)   Endotracheally intubated   Acute encephalopathy   AKI (acute kidney injury) ([place])   Hyperkalemia   Pressure injury of skin   Acute hypoxemic respiratory failure (HCC)   Acute pulmonary edema (HCC)   Acute combined systolic and diastolic heart failure (HCC)   Cardiogenic shock (HCC)     Subjective   Diuresed well yesterday.  Wrote on board question of when he can go home.  Inpatient Medications    Scheduled Meds: . amiodarone  100 mg Oral Daily  . budesonide (PULMICORT) nebulizer solution  0.5 mg Nebulization BID  . chlorhexidine gluconate (MEDLINE KIT)  15 mL Mouth Rinse BID  . docusate  100 mg Per Tube Daily  . feeding supplement (PRO-STAT SUGAR FREE 64)  30 mL Per Tube BID  . folic acid  1 mg Per Tube Daily  . furosemide  80 mg Intravenous BID  . heparin  5,000 Units Subcutaneous Q8H  . hydrocortisone cream   Topical BID  . insulin aspart  0-9 Units Subcutaneous Q4H  . ipratropium-albuterol  3 mL Nebulization Q6H  . mouth rinse  15 mL Mouth Rinse 10 times per day  . mirtazapine  15 mg Oral QHS  . multivitamin with minerals  1 tablet Per Tube Daily  . pantoprazole sodium  40 mg Per Tube Q1200  . sodium chloride flush  10-40 mL Intracatheter Q12H  . thiamine  100 mg Per Tube Daily   Continuous Infusions: . DOBUTamine 2.5 mcg/kg/min (03/08/16 2301)  . feeding supplement (VITAL AF 1.2 CAL) 1,000 mL (03/08/16  2243)  . fentaNYL infusion INTRAVENOUS 25 mcg/hr (03/08/16 1100)   PRN Meds: albuterol, fentaNYL, midazolam, midazolam, sodium chloride flush   Vital Signs    Vitals:   03/09/16 0600 03/09/16 0700 03/09/16 0800 03/09/16 0900  BP: 106/78 115/66  113/75  Pulse: (!) 111 (!) 110  (!) 112  Resp: 13 19  18   Temp:   98.8 F (37.1 C)   TempSrc:      SpO2: 100% 100%  100%  Weight:      Height:        Intake/Output Summary (Last 24 hours) at 03/09/16 1024 Last data filed at 03/09/16 0934  Gross per 24 hour  Intake             1266 ml  Output             3690 ml  Net            -2424 ml   Filed Weights   03/07/16 0500 03/08/16 0146 03/09/16 0500  Weight: 67.8 kg (149 lb 7.6 oz) 67 kg (147 lb 11.3 oz) 67.4 kg (148 lb 9.4 oz)    Physical Exam   GEN: Intubated, sedated HEENT: Grossly normal.  Neck: Supple, no JVD, carotid bruits, or masses. Cardiac: tachycardic , irregular Respiratory:  Respirations regular and unlabored, clear to auscultation bilaterally. GI: Soft, nontender, nondistended, BS +  x 4. MS: no deformity or atrophy. Skin: rash on face Neuro:  Writing questions, moving arms Psych: intubated, sedated  Labs    CBC  Recent Labs  03/08/16 0500 03/09/16 0528  WBC 7.7 8.3  HGB 8.5* 9.0*  HCT 28.5* 29.8*  MCV 104.8* 104.6*  PLT 184 277   Basic Metabolic Panel  Recent Labs  03/08/16 0500 03/09/16 0528  NA 137 139  K 4.4 3.9  CL 101 97*  CO2 31 35*  GLUCOSE 118* 121*  BUN 31* 34*  CREATININE 0.79 0.77  CALCIUM 8.5* 8.8*  MG 1.9 1.8  PHOS 1.6* 2.2*   Liver Function Tests No results for input(s): AST, ALT, ALKPHOS, BILITOT, PROT, ALBUMIN in the last 72 hours. No results for input(s): LIPASE, AMYLASE in the last 72 hours. Cardiac Enzymes No results for input(s): CKTOTAL, CKMB, CKMBINDEX, TROPONINI in the last 72 hours. BNP Invalid input(s): POCBNP D-Dimer No results for input(s): DDIMER in the last 72 hours. Hemoglobin A1C No results for  input(s): HGBA1C in the last 72 hours. Fasting Lipid Panel No results for input(s): CHOL, HDL, LDLCALC, TRIG, CHOLHDL, LDLDIRECT in the last 72 hours. Thyroid Function Tests No results for input(s): TSH, T4TOTAL, T3FREE, THYROIDAB in the last 72 hours.  Invalid input(s): FREET3  Telemetry    Tachy, AFib - Personally Reviewed  ECG    None recent  Radiology    No results found.  Cardiac Studies   EF 25-30%  Patient Profile     80 y/o with systolic heart failure, recovering from sepsis  Assessment & Plan    Acute on chronic systolic and diastolic heart failure:  Decrease Lasix to 40 mg IV BID.  Sodium has increased.  No edema in legs. HR increasing.  Renal function stable.    AFib: Tachycardic, contributing to heart failure with loss of atrial kick. Getting low dose amiodarone.  No other rate control meds while on dobutamine.  D/C dobutamine to help with heart rate as well.   Respiratory failure:  On 30% FiO2.  Weaning from vent.  NSVT noted in the past.  SHould improve with less Dobutamine. RBBB at baseline so it may be confused for VT.  SignedLarae Grooms, MD  03/09/2016, 10:24 AM

## 2016-03-09 NOTE — Progress Notes (Signed)
   03/09/16 1051 03/09/16 1052 03/09/16 1053  Vitals  BP --  (!) 139/116 --   MAP (mmHg) --  124 --   Pulse Rate (!) 152 (!) 160 (!) 140  ECG Heart Rate (!) 152 (!) 160 (!) 140  Resp (!) 43 (!) 46 (!) 32  Oxygen Therapy  SpO2 100 % 100 % 100 %     03/09/16 1054 03/09/16 1055 03/09/16 1056  Vitals  BP 135/78 --  130/74  MAP (mmHg) 94 --  92  Pulse Rate (!) 126 (!) 115 (!) 114  ECG Heart Rate (!) 126 (!) 115 (!) 114  Resp (!) 35 (!) 29 (!) 24  Oxygen Therapy  SpO2 100 % 100 % 100 %  Pt tachypneic, tachycardic, anxious.  Dr. Irish Lack at bedside to assess pt.  Respiratory therapist paged to come put pt back on full vent support.  Fentanyl restarted.  Will continue to monitor pt closely.l.

## 2016-03-10 ENCOUNTER — Inpatient Hospital Stay (HOSPITAL_COMMUNITY): Payer: Medicare Other

## 2016-03-10 DIAGNOSIS — L89609 Pressure ulcer of unspecified heel, unspecified stage: Secondary | ICD-10-CM

## 2016-03-10 LAB — MAGNESIUM: Magnesium: 2.1 mg/dL (ref 1.7–2.4)

## 2016-03-10 LAB — HEPATIC FUNCTION PANEL
ALT: 124 U/L — AB (ref 17–63)
AST: 36 U/L (ref 15–41)
Albumin: 2.7 g/dL — ABNORMAL LOW (ref 3.5–5.0)
Alkaline Phosphatase: 90 U/L (ref 38–126)
BILIRUBIN DIRECT: 0.5 mg/dL (ref 0.1–0.5)
BILIRUBIN INDIRECT: 0.6 mg/dL (ref 0.3–0.9)
BILIRUBIN TOTAL: 1.1 mg/dL (ref 0.3–1.2)
Total Protein: 6.1 g/dL — ABNORMAL LOW (ref 6.5–8.1)

## 2016-03-10 LAB — CBC
HEMATOCRIT: 31.3 % — AB (ref 39.0–52.0)
Hemoglobin: 9.3 g/dL — ABNORMAL LOW (ref 13.0–17.0)
MCH: 31.2 pg (ref 26.0–34.0)
MCHC: 29.7 g/dL — AB (ref 30.0–36.0)
MCV: 105 fL — ABNORMAL HIGH (ref 78.0–100.0)
Platelets: 183 10*3/uL (ref 150–400)
RBC: 2.98 MIL/uL — ABNORMAL LOW (ref 4.22–5.81)
RDW: 18.6 % — AB (ref 11.5–15.5)
WBC: 9 10*3/uL (ref 4.0–10.5)

## 2016-03-10 LAB — GLUCOSE, CAPILLARY
GLUCOSE-CAPILLARY: 115 mg/dL — AB (ref 65–99)
GLUCOSE-CAPILLARY: 124 mg/dL — AB (ref 65–99)
GLUCOSE-CAPILLARY: 151 mg/dL — AB (ref 65–99)
GLUCOSE-CAPILLARY: 92 mg/dL (ref 65–99)
Glucose-Capillary: 158 mg/dL — ABNORMAL HIGH (ref 65–99)
Glucose-Capillary: 125 mg/dL — ABNORMAL HIGH (ref 65–99)

## 2016-03-10 LAB — BASIC METABOLIC PANEL
Anion gap: 10 (ref 5–15)
BUN: 41 mg/dL — AB (ref 6–20)
CALCIUM: 9.1 mg/dL (ref 8.9–10.3)
CO2: 35 mmol/L — AB (ref 22–32)
CREATININE: 0.86 mg/dL (ref 0.61–1.24)
Chloride: 96 mmol/L — ABNORMAL LOW (ref 101–111)
GFR calc non Af Amer: >60 mL/min (ref >60)
Glucose, Bld: 126 mg/dL — ABNORMAL HIGH (ref 65–99)
Potassium: 3.2 mmol/L — ABNORMAL LOW (ref 3.5–5.1)
SODIUM: 141 mmol/L (ref 135–145)

## 2016-03-10 LAB — PHOSPHORUS: PHOSPHORUS: 3.1 mg/dL (ref 2.5–4.6)

## 2016-03-10 MED ORDER — POTASSIUM CHLORIDE 10 MEQ/100ML IV SOLN
10.0000 meq | INTRAVENOUS | Status: AC
  Administered 2016-03-10 (×3): 10 meq via INTRAVENOUS
  Filled 2016-03-10 (×3): qty 100

## 2016-03-10 MED ORDER — FENTANYL CITRATE (PF) 100 MCG/2ML IJ SOLN
100.0000 ug | INTRAMUSCULAR | Status: DC | PRN
  Administered 2016-03-10 – 2016-03-11 (×4): 100 ug via INTRAVENOUS
  Filled 2016-03-10 (×4): qty 2

## 2016-03-10 MED ORDER — POTASSIUM CHLORIDE 20 MEQ/15ML (10%) PO SOLN
40.0000 meq | Freq: Once | ORAL | Status: AC
  Administered 2016-03-10: 40 meq

## 2016-03-10 MED ORDER — SODIUM CHLORIDE 0.9 % IV SOLN
INTRAVENOUS | Status: DC
  Administered 2016-03-10: 23:00:00 via INTRAVENOUS

## 2016-03-10 MED ORDER — METOPROLOL TARTRATE 25 MG PO TABS
25.0000 mg | ORAL_TABLET | Freq: Two times a day (BID) | ORAL | Status: DC
  Administered 2016-03-10 – 2016-03-13 (×8): 25 mg via ORAL
  Filled 2016-03-10 (×9): qty 1

## 2016-03-10 MED ORDER — POTASSIUM CHLORIDE 10 MEQ/100ML IV SOLN
10.0000 meq | Freq: Once | INTRAVENOUS | Status: AC
  Administered 2016-03-10: 10 meq via INTRAVENOUS
  Filled 2016-03-10: qty 100

## 2016-03-10 NOTE — Progress Notes (Signed)
PULMONARY / CRITICAL CARE MEDICINE   Name: Chad Rogers MRN: [id number] DOB: April 19, 1936    ADMISSION DATE:  02/25/2016 CONSULTATION DATE:  03/04/16  REFERRING MD:  03/04/16  CHIEF COMPLAINT:  Dr. Wyline Copas   BRIEF SUMMARY:  80 y/o M, smoker, with PMH of ETOH abuse, allergic rhinitis, psoriasis, skin cancer, osteoarthritis, CAD, HTN, atrial fibrillation (not on anticoagulation, Hx falls), systolic CHF (EF [id number]), HLD, pulmonary fibrosis and COPD (previously followed by Dr. Melvyn Novas - 2011) admitted to [facility] on 02/27/2016 complaints of SOB.    The patient recently had a prolonged hospitalization after a fall with several rib fractures.  During that admit, he had a prolonged intubation requiring tracheostomy and admission to an LTAC followed by rehab. The patient was apparently discharged one week prior to presentation.  He reported nausea, vomiting, weakness initially that resolved.  Subsequently, he developed worsening shortness of breath & DOE.  On admit, he was hypotensive, hypoxic and had an elevated lactic acid (7).  Work up concerning for new infiltrate & volume depletion with AKI.  The patient was admitted per [facility] for further care.  He responded well to IVF's and oxygen initially.  He was later found to have pulmonary edema with associated effusions and treated with Lasix.  Follow up CXR evaluation showed enlarging effusions.  Cardiology was consulted for evaluation.  During their evaluation, he was found to have pronounced bradycardia and pauses.  Atropine was administered and he was started on an external pacemaker.  HR was labile and he was started on dopamine.  He had subsequent respiratory distress with hypoxemia (sats dropped to 70's) with altered level of consciousness and required intubation.  He developed further metabolic derangements ([id number], CO2 18, Hgb 9.7) 10/16 and was transferred to [facility] for further care.     SUBJECTIVE:    VITAL SIGNS: BP (!) 89/59   Pulse (!) 110    Temp 98.3 F (36.8 C) (Oral)   Resp 16   Ht 5\' 10"  (1.778 m)   Wt 141 lb 15.6 oz (64.4 kg)   SpO2 100%   BMI 20.37 kg/m   HEMODYNAMICS:    VENTILATOR SETTINGS: Vent Mode: PRVC FiO2 (%):  [30 %] 30 % Set Rate:  [16 bmp] 16 bmp Vt Set:  [580 mL] 580 mL PEEP:  [5 cmH20] 5 cmH20 Plateau Pressure:  [16 cmH20-28 cmH20] 16 cmH20  INTAKE / OUTPUT: I/O last 3 completed shifts: In: 3090.6 [I.V.:143.6; NG/GT:2685; IV Piggyback:262] Out: 4100 [Urine:4100]  PHYSICAL EXAMINATION: General: Chronically ill appearing male, resting in bed, in NAD. Think and deconditioned and malnoursihjed Neuro: RASS 0. Watching TV. CAM-ICU neg for delirium Follows commands.  HEENT: /AT. PERRL, sclerae anicteric. Cardiovascular: RRR, no M/R/G.  Lungs: Respirations even and unlabored. Bibasilar crackles.  Abdomen: BS x 4, soft, NT/ND.  Musculoskeletal: No gross deformities, trace edema.  Skin: Dry, intact, warm, no rashes.   LABS:  PULMONARY  Recent Labs Lab 03/04/16 1155 03/04/16 1815 03/04/16 2119 03/05/16 0458 03/05/16 2017 03/06/16 0525 03/06/16 1610 03/07/16 0335  PHART 7.245* 7.363 7.356 7.390  --   --   --   --   PCO2ART 29.0* 33.3 35.2 36.6  --   --   --   --   PO2ART 90.6 176* 177.0* 151*  --   --   --   --   HCO3 13.7* 19.7* 19.9* 21.8  --   --   --   --   TCO2  --   --  21  --   --   --   --   --   O2SAT 94.4 99.5 100.0 99.1 74.8 67.2 84.2 71.6   CBC  Recent Labs Lab 03/08/16 0500 April 05, 2016 0528 03/10/16 0500  HGB 8.5* 9.0* 9.3*  HCT 28.5* 29.8* 31.3*  WBC 7.7 8.3 9.0  PLT 184 188 183   COAGULATION No results for input(s): INR in the last 168 hours.  CARDIAC  Recent Labs Lab 03/04/16 2006  TROPONINI 0.05*   No results for input(s): PROBNP in the last 168 hours.  CHEMISTRY  Recent Labs Lab 03/06/16 1625 03/06/16 2206 03/07/16 0333 03/08/16 0500 Apr 05, 2016 0528 03/10/16 0500  NA 133* 132* 133* 137 139 141  K 5.0 4.2 4.3 4.4 3.9 3.2*  CL 97* 98* 98* 101  97* 96*  CO2 29 28 28 31  35* 35*  GLUCOSE 120* 166* 135* 118* 121* 126*  BUN 28* 28* 29* 31* 34* 41*  CREATININE 1.25* 1.15 1.09 0.79 0.77 0.86  CALCIUM 8.3* 8.2* 8.2* 8.5* 8.8* 9.1  MG 2.2  --  2.0 1.9 1.8 2.1  PHOS 2.9  --  2.0* 1.6* 2.2* 3.1   Estimated Creatinine Clearance: 64.5 mL/min (by C-G formula based on SCr of 0.86 mg/dL).  LIVER  Recent Labs Lab 03/04/16 2006 03/06/16 0425 03/10/16 0500  AST 507* 137* 36  ALT 647* 421* 124*  ALKPHOS 119 111 90  BILITOT 2.1* 1.9* 1.1  PROT 5.2* 5.2* 6.1*  ALBUMIN 2.6* 2.4* 2.7*   INFECTIOUS  Recent Labs Lab 03/04/16 2006 03/05/16 0327 03/06/16 0425  LATICACIDVEN 1.5  --   --   PROCALCITON 0.26 0.32 0.19   ENDOCRINE CBG (last 3)   Recent Labs  03/10/16 0350 03/10/16 0827 03/10/16 1132  GLUCAP 158* 92 151*    IMAGING x48h  - image(s) personally visualized Dg Chest Port 1 View  Result Date: 03/10/2016 CLINICAL DATA:  Check endotracheal tube placement EXAM: PORTABLE CHEST 1 VIEW COMPARISON:  03/07/2016 FINDINGS: Cardiac shadow is stable. A right-sided PICC line, endotracheal tube and nasogastric catheter are again seen and stable. Calcified granuloma is noted in the right lung base. Right pleural effusion is noted posteriorly. No focal infiltrate is seen. Healing right rib fractures are noted. IMPRESSION: Tubes and lines as described above. Right-sided pleural effusion. Electronically Signed   By: Inez Catalina M.D.   On: 03/10/2016 08:15   STUDIES:  CXR 10/16 > mild pulmonary edema with right pleural effusion and left basilar atx.  CULTURES: Blood 10/11 > (-)  Sputum 10/16 > (-)  ANTIBIOTICS: Vanc 10/11 > 10/16 Cefepime 10/11 > 10/19  LINES/TUBES: ETT 10/16 >  SIGNIFICANT EVENTS: 10/11  admitted to [facility]. 10/16  transferred to [facility]. 10/17  ef 25%; baseline 10/20  Dobutamine being weaned off.  Apr 06, 2023  Cards deceased lasix. Renal function stable. Off dobutamine. Failed SBT with NSVT 10/22  Weaned 3  hours  DISCUSSION: 80 y.o. male with multiple co-morbidities, admitted by [facility] 03/05/2016 with SOB suspected due to HCAP vs pulmonary edema. On 10/16, developed worsening hypoxia along with profound bradycardia requiring atropine, TC pacing, dopamine.  Required intubation due to respiratory distress and AMS.  Later transferred to [facility] for further evaluation and management.   ASSESSMENT / PLAN:  PULMONARY A: Acute hypoxic respiratory failure - s/p emergent intubation 10/16 for AMS, likely related to bradycardia Acute Pulmonary edema / Acute sCHF exacerbation  Possible HCAP - bibasilar vs atelectasis. Completed 8days of cefepime (10/19) Hx pulmonary fibrosis, COPD - followed by  Dr. Melvyn Novas. Tobacco dependence. P:   Full vent support -> might be heading to trach.  Daily wean. Cont diuresis per cards VAP prevention measures. Budesonide in lieu of preadmission Breo. DuoNebs scheduled / Albuterol PRN. Tobacco cessation counseling once extubated.  CARDIOVASCULAR A:  Cardiogenic shock - improved Acute on Chronic CHF systolic exacerbation, EF 25-30% Pulmonary edema Demand ischemia Afib/A flutter Sinus bradycardia with pauses - s/p atropine and external pacing followed by dobutamine, resolved. NSVT - during PSV wean, resolved P:  Cardiology following, appreciate input Cont diuresis, lasix 40 mg BID Tele monitoring  Negative balance as BP/renal function permit   RENAL A:   AKI Hyponatremia - presumed hypervolemic in setting CHF. Hypokalemia Hypomagnesemia Hypophosphatemia P:   Diuretics per cardiology Trend BMP / UOP Replace electrolytes as indicated   GASTROINTESTINAL A:   GI prophylaxis. Nutrition. Transaminitis likely from [facility] 2/2 CHF P:   SUP: Pantoprazole. Cont TF   HEMATOLOGIC A:   VTE Prophylaxis. P:  SCD's / heparin. CBC in AM.  INFECTIOUS A:   HCAP - completed 8d of cefepime.  P:   Observe off Abx. Monitor fever curve / WBC  ENDOCRINE A:   No  acute issues. P:   Monitor glucose on BMP.  NEUROLOGIC A:   Acute encephalopathy. Hx ETOH abuse. P:   PRN fentanyl for pain PRN versed for sedation  RASS goal: 0 to -1. Daily WUA. Thiamine / Folate.  Hold preadmission mirtazapine, quetiapine.  Start sertraline at [facility] 03/08/16. - monitor Qtc   Family updated: grandaughter at bedside and updated on 10/19. No family at bedside on 10/22  Interdisciplinary Family Meeting v Palliative Care Meeting:  Due by: 03/11/16.   Noe Gens, NP-C [facility] [place] Pgr: [phone] or if no answer [phone] 03/10/2016, 3:42 PM

## 2016-03-10 NOTE — Progress Notes (Signed)
Patient Name: Chad Rogers Date of Encounter: 03/10/2016  Primary Cardiologist: Dr. Ladonna Snide Problem List     Principal Problem:   Sepsis [facility]) Active Problems:   Hypertension   Pulmonary fibrosis ([place])   Tobacco abuse   Traumatic fracture of ribs with pneumothorax   Multiple rib fractures   Acute on chronic systolic congestive heart failure (HCC)   Atrial fibrillation, persistent ([place])   Debility   HCAP (healthcare-associated pneumonia)   Protein-calorie malnutrition, severe   Metabolic acidosis   CHF (congestive heart failure) (HCC)   Endotracheally intubated   Acute encephalopathy   AKI (acute kidney injury) ([place])   Hyperkalemia   Pressure injury of skin   Acute hypoxemic respiratory failure (HCC)   Acute pulmonary edema (HCC)   Acute combined systolic and diastolic heart failure (HCC)   Cardiogenic shock (HCC)     Subjective   Diuresed well again yesterday.  More calm today.  Had increased HR when the vent was weaned.  .  Inpatient Medications    Scheduled Meds: . amiodarone  100 mg Oral Daily  . budesonide (PULMICORT) nebulizer solution  0.5 mg Nebulization BID  . chlorhexidine gluconate (MEDLINE KIT)  15 mL Mouth Rinse BID  . docusate  100 mg Per Tube Daily  . feeding supplement (PRO-STAT SUGAR FREE 64)  30 mL Per Tube BID  . folic acid  1 mg Per Tube Daily  . furosemide  40 mg Intravenous BID  . heparin  5,000 Units Subcutaneous Q8H  . hydrocortisone cream   Topical BID  . insulin aspart  0-9 Units Subcutaneous Q4H  . ipratropium-albuterol  3 mL Nebulization Q6H  . mouth rinse  15 mL Mouth Rinse 10 times per day  . metoprolol tartrate  25 mg Oral BID  . mirtazapine  15 mg Oral QHS  . multivitamin with minerals  1 tablet Per Tube Daily  . pantoprazole sodium  40 mg Per Tube Q1200  . potassium chloride  10 mEq Intravenous Q1 Hr x 4  . sodium chloride flush  10-40 mL Intracatheter Q12H  . thiamine  100 mg Per Tube Daily   Continuous  Infusions: . feeding supplement (VITAL AF 1.2 CAL) 1,000 mL (03/09/16 1750)   PRN Meds: albuterol, fentaNYL (SUBLIMAZE) injection, midazolam, sodium chloride flush   Vital Signs    Vitals:   03/10/16 0600 03/10/16 0700 03/10/16 0800 03/10/16 0835  BP: (!) 75/57 98/68 108/72   Pulse: (!) 54 (!) 110 (!) 109 (!) 110  Resp: _0 Temp:   99 F (37.2 C)   TempSrc:   Oral   SpO2: 100% 100% 100% 100%  Weight:      Height:        Intake/Output Summary (Last 24 hours) at 03/10/16 0902 Last data filed at 03/10/16 0600  Gross per 24 hour  Intake          1716.53 ml  Output             1835 ml  Net          -118.47 ml   Filed Weights   03/08/16 0146 03/09/16 0500 03/10/16 0500  Weight: 67 kg (147 lb 11.3 oz) 67.4 kg (148 lb 9.4 oz) 64.4 kg (141 lb 15.6 oz)    Physical Exam   GEN: Intubated, sedated HEENT: Grossly normal.  Neck: Supple, no JVD, carotid bruits, or masses. Cardiac: tachycardic , irregular Respiratory:  Coarse breath sounds bilaterally. GI: Soft,  nontender, nondistended, BS + x 4. MS: no deformity or atrophy. Skin: rash on face, questionable area on her heel Neuro:   moving arms Psych: intubated, sedated  Labs    CBC  Recent Labs  03/09/16 0528 03/10/16 0500  WBC 8.3 9.0  HGB 9.0* 9.3*  HCT 29.8* 31.3*  MCV 104.6* 105.0*  PLT 188 060   Basic Metabolic Panel  Recent Labs  03/09/16 0528 03/10/16 0500  NA 139 141  K 3.9 3.2*  CL 97* 96*  CO2 35* 35*  GLUCOSE 121* 126*  BUN 34* 41*  CREATININE 0.77 0.86  CALCIUM 8.8* 9.1  MG 1.8 2.1  PHOS 2.2* 3.1   Liver Function Tests  Recent Labs  03/10/16 0500  AST 36  ALT 124*  ALKPHOS 90  BILITOT 1.1  PROT 6.1*  ALBUMIN 2.7*   No results for input(s): LIPASE, AMYLASE in the last 72 hours. Cardiac Enzymes No results for input(s): CKTOTAL, CKMB, CKMBINDEX, TROPONINI in the last 72 hours. BNP Invalid input(s): POCBNP D-Dimer No results for input(s): DDIMER in the last 72  hours. Hemoglobin A1C No results for input(s): HGBA1C in the last 72 hours. Fasting Lipid Panel No results for input(s): CHOL, HDL, LDLCALC, TRIG, CHOLHDL, LDLDIRECT in the last 72 hours. Thyroid Function Tests No results for input(s): TSH, T4TOTAL, T3FREE, THYROIDAB in the last 72 hours.  Invalid input(s): FREET3  Telemetry    Tachy, AFib - Personally Reviewed  ECG    None recent  Radiology    Dg Chest Port 1 View  Result Date: 03/10/2016 CLINICAL DATA:  Check endotracheal tube placement EXAM: PORTABLE CHEST 1 VIEW COMPARISON:  03/07/2016 FINDINGS: Cardiac shadow is stable. A right-sided PICC line, endotracheal tube and nasogastric catheter are again seen and stable. Calcified granuloma is noted in the right lung base. Right pleural effusion is noted posteriorly. No focal infiltrate is seen. Healing right rib fractures are noted. IMPRESSION: Tubes and lines as described above. Right-sided pleural effusion. Electronically Signed   By: Inez Catalina M.D.   On: 03/10/2016 08:15    Cardiac Studies   EF 25-30%  Patient Profile     80 y/o with systolic heart failure, recovering from sepsis  Assessment & Plan    Acute on chronic systolic and diastolic heart failure:  Continue Lasix to 40 mg IV BID.  Sodium has increased.  No edema in legs. HR increasing.  Renal function stable.  Replaced potassium this AM.  AFib: Tachycardic, contributing to heart failure with loss of atrial kick. Getting low dose amiodarone.  No other rate control meds while on dobutamine.  D/Ced dobutamine on 10/21 to help with heart rate as well.  Still with some rapid rates.  Will start metoprolol 25 mg BID given systolic dysfunction and increased HR.  Resp status could limit how high of a dose can be given.   Respiratory failure:  Attempting weaning from vent again today.  NSVT noted in the past.  Should improve with less Dobutamine. RBBB at baseline so it may be confused for VT.  Order pressure reducing  boot for area on heel.  COnsider palliative care consult as well.    Signed, Larae Grooms, MD  03/10/2016, 9:02 AM

## 2016-03-10 NOTE — Progress Notes (Signed)
Found pt in respiratory distress, respirations 30-40, cold to touch, face reddened, legs were mottled, lungs with exp wheezing, rhonchi : suctioned patient to find plugs and copious thick tan and yellow secretions, IV Fentanyl, Versed as his PRN meds given as well of nebs, soon pt relaxed and sounded clearer. Will continue to monitor patient closely.

## 2016-03-10 NOTE — Progress Notes (Signed)
Patient returned to full support due to increased WOB.

## 2016-03-11 ENCOUNTER — Inpatient Hospital Stay (HOSPITAL_COMMUNITY): Payer: Medicare Other

## 2016-03-11 DIAGNOSIS — E875 Hyperkalemia: Secondary | ICD-10-CM

## 2016-03-11 DIAGNOSIS — J9600 Acute respiratory failure, unspecified whether with hypoxia or hypercapnia: Secondary | ICD-10-CM

## 2016-03-11 DIAGNOSIS — E43 Unspecified severe protein-calorie malnutrition: Secondary | ICD-10-CM

## 2016-03-11 LAB — CBC
HCT: 31.1 % — ABNORMAL LOW (ref 39.0–52.0)
Hemoglobin: 9.4 g/dL — ABNORMAL LOW (ref 13.0–17.0)
MCH: 31.8 pg (ref 26.0–34.0)
MCHC: 30.2 g/dL (ref 30.0–36.0)
MCV: 105.1 fL — ABNORMAL HIGH (ref 78.0–100.0)
PLATELETS: 170 10*3/uL (ref 150–400)
RBC: 2.96 MIL/uL — ABNORMAL LOW (ref 4.22–5.81)
RDW: 18.6 % — ABNORMAL HIGH (ref 11.5–15.5)
WBC: 9.1 10*3/uL (ref 4.0–10.5)

## 2016-03-11 LAB — GLUCOSE, CAPILLARY
GLUCOSE-CAPILLARY: 109 mg/dL — AB (ref 65–99)
GLUCOSE-CAPILLARY: 129 mg/dL — AB (ref 65–99)
GLUCOSE-CAPILLARY: 130 mg/dL — AB (ref 65–99)
GLUCOSE-CAPILLARY: 95 mg/dL (ref 65–99)
Glucose-Capillary: 116 mg/dL — ABNORMAL HIGH (ref 65–99)
Glucose-Capillary: 102 mg/dL — ABNORMAL HIGH (ref 65–99)

## 2016-03-11 LAB — BASIC METABOLIC PANEL
ANION GAP: 11 (ref 5–15)
Anion gap: 10 (ref 5–15)
BUN: 50 mg/dL — ABNORMAL HIGH (ref 6–20)
BUN: 49 mg/dL — ABNORMAL HIGH (ref 6–20)
CALCIUM: 9.1 mg/dL (ref 8.9–10.3)
CHLORIDE: 100 mmol/L — AB (ref 101–111)
CO2: 34 mmol/L — AB (ref 22–32)
CO2: 33 mmol/L — ABNORMAL HIGH (ref 22–32)
CREATININE: 0.89 mg/dL (ref 0.61–1.24)
Calcium: 9.3 mg/dL (ref 8.9–10.3)
Chloride: 97 mmol/L — ABNORMAL LOW (ref 101–111)
Creatinine, Ser: 0.95 mg/dL (ref 0.61–1.24)
GFR calc Af Amer: >60 mL/min (ref >60)
GFR calc Af Amer: >60 mL/min (ref >60)
GFR calc non Af Amer: >60 mL/min (ref >60)
GLUCOSE: 118 mg/dL — AB (ref 65–99)
Glucose, Bld: 113 mg/dL — ABNORMAL HIGH (ref 65–99)
POTASSIUM: 3.3 mmol/L — AB (ref 3.5–5.1)
Potassium: 3.3 mmol/L — ABNORMAL LOW (ref 3.5–5.1)
SODIUM: 144 mmol/L (ref 135–145)
Sodium: 141 mmol/L (ref 135–145)

## 2016-03-11 MED ORDER — POTASSIUM CHLORIDE 20 MEQ/15ML (10%) PO SOLN
20.0000 meq | Freq: Once | ORAL | Status: DC
  Filled 2016-03-11: qty 15

## 2016-03-11 MED ORDER — POTASSIUM CHLORIDE 10 MEQ/50ML IV SOLN
10.0000 meq | INTRAVENOUS | Status: DC | PRN
Start: 2016-03-11 — End: 2016-03-12
  Administered 2016-03-11: 10 meq via INTRAVENOUS
  Filled 2016-03-11: qty 50

## 2016-03-11 MED ORDER — ACETYLCYSTEINE 20 % IN SOLN
4.0000 mL | Freq: Two times a day (BID) | RESPIRATORY_TRACT | Status: DC
  Administered 2016-03-11 – 2016-03-12 (×3): 4 mL via RESPIRATORY_TRACT
  Filled 2016-03-11 (×3): qty 4

## 2016-03-11 MED ORDER — FUROSEMIDE 10 MG/ML IJ SOLN
40.0000 mg | Freq: Four times a day (QID) | INTRAMUSCULAR | Status: DC
  Administered 2016-03-11 – 2016-03-12 (×4): 40 mg via INTRAVENOUS
  Filled 2016-03-11 (×4): qty 4

## 2016-03-11 MED ORDER — FENTANYL CITRATE (PF) 100 MCG/2ML IJ SOLN
25.0000 ug | INTRAMUSCULAR | Status: DC | PRN
  Administered 2016-03-11 – 2016-03-12 (×5): 50 ug via INTRAVENOUS
  Filled 2016-03-11 (×3): qty 2

## 2016-03-11 MED ORDER — POTASSIUM CHLORIDE 20 MEQ/15ML (10%) PO SOLN
40.0000 meq | Freq: Once | ORAL | Status: DC
  Filled 2016-03-11: qty 30

## 2016-03-11 NOTE — Progress Notes (Signed)
[place] Progress Note Patient Name: Chad Rogers DOB: 1935-12-08 MRN: [id number]   Date of Service  03/11/2016  HPI/Events of Note  Multiple issues: 1. K+ = 3.3 this AM, has only received 1 run of K+ according to bedside nurse and 2. C/o rib pain.   eICU Interventions  Will order: 1. Repeat BMP now. 2. Change Fentanyl dose to 25-50 mcg IV Q 2 hours PRN pain.      Intervention Category Intermediate Interventions: Pain - evaluation and management;Electrolyte abnormality - evaluation and management  Sommer,Steven Eugene 03/11/2016, 9:54 PM

## 2016-03-11 NOTE — Progress Notes (Signed)
Patient extubated. Tube feeds discontinued. Patient now on 3L nasal cannula. NPO at this time.

## 2016-03-11 NOTE — Progress Notes (Signed)
Patient Name: Chad Rogers Date of Encounter: 03/11/2016  Primary Cardiologist: Dorris Carnes, M.D.  Hospital Problem List     Principal Problem:   Sepsis [facility]) Active Problems:   Hypertension   Pulmonary fibrosis ([place])   Tobacco abuse   Traumatic fracture of ribs with pneumothorax   Multiple rib fractures   Acute on chronic systolic congestive heart failure (HCC)   Atrial fibrillation, persistent ([place])   Debility   HCAP (healthcare-associated pneumonia)   Protein-calorie malnutrition, severe   Metabolic acidosis   CHF (congestive heart failure) (HCC)   Endotracheally intubated   Acute encephalopathy   AKI (acute kidney injury) ([place])   Hyperkalemia   Pressure injury of skin   Acute hypoxemic respiratory failure (HCC)   Acute pulmonary edema (HCC)   Acute combined systolic and diastolic heart failure (HCC)   Cardiogenic shock (HCC)     Subjective   Awake on the ventilator. Alert and frustrated with inability to communicate. Left lower quadrant discomfort. Does not feel dyspneic on the ventilator.  Inpatient Medications    Scheduled Meds: . amiodarone  100 mg Oral Daily  . budesonide (PULMICORT) nebulizer solution  0.5 mg Nebulization BID  . chlorhexidine gluconate (MEDLINE KIT)  15 mL Mouth Rinse BID  . docusate  100 mg Per Tube Daily  . feeding supplement (PRO-STAT SUGAR FREE 64)  30 mL Per Tube BID  . folic acid  1 mg Per Tube Daily  . furosemide  40 mg Intravenous BID  . heparin  5,000 Units Subcutaneous Q8H  . hydrocortisone cream   Topical BID  . insulin aspart  0-9 Units Subcutaneous Q4H  . ipratropium-albuterol  3 mL Nebulization Q6H  . mouth rinse  15 mL Mouth Rinse 10 times per day  . metoprolol tartrate  25 mg Oral BID  . mirtazapine  15 mg Oral QHS  . multivitamin with minerals  1 tablet Per Tube Daily  . pantoprazole sodium  40 mg Per Tube Q1200  . sodium chloride flush  10-40 mL Intracatheter Q12H  . thiamine  100 mg Per Tube Daily    Continuous Infusions: . sodium chloride 10 mL/hr at 03/10/16 2300  . feeding supplement (VITAL AF 1.2 CAL) 1,000 mL (03/09/16 1750)   PRN Meds: albuterol, fentaNYL (SUBLIMAZE) injection, midazolam, sodium chloride flush   Vital Signs    Vitals:   03/11/16 0746 03/11/16 0748 03/11/16 0800 03/11/16 0801  BP: (!) 73/49  107/66 107/66  Pulse: (!) 113  (!) 113 (!) 114  Resp: 19  18 (!) 22  Temp:    99.6 F (37.6 C)  TempSrc:    Oral  SpO2: 99% 99% 100% 100%  Weight:      Height:        Intake/Output Summary (Last 24 hours) at 03/11/16 0834 Last data filed at 03/11/16 0800  Gross per 24 hour  Intake             2160 ml  Output             1850 ml  Net              310 ml   Filed Weights   03/09/16 0500 03/10/16 0500 03/11/16 0500  Weight: 148 lb 9.4 oz (67.4 kg) 141 lb 15.6 oz (64.4 kg) 146 lb 6.2 oz (66.4 kg)    Physical Exam    GEN: Slender and somewhat undernourished on ventilator in moderate respiratory distress.  HEENT: Grossly normal.  Neck: Supple,  no JVD, carotid bruits, or masses. Cardiac: Heart sounds are distant. IRR, no murmurs, rubs, or gallops. No clubbing, cyanosis, edema.  Radials/DP/PT 2+ and equal bilaterally.  Respiratory:  He is on ventilator. Breath sounds are difficult due to background support noses. GI: Soft, nontender, nondistended, BS + x 4. Mild left mid abdominal discomfort MS: no deformity or atrophy. Skin: warm and dry, no rash. Neuro:  Strength and sensation are intact. Psych: AAOx3.  Normal affect.  Labs    CBC  Recent Labs  03/10/16 0500 03/11/16 0354  WBC 9.0 9.1  HGB 9.3* 9.4*  HCT 31.3* 31.1*  MCV 105.0* 105.1*  PLT 183 161   Basic Metabolic Panel  Recent Labs  03/09/16 0528 03/10/16 0500 03/11/16 0354  NA 139 141 141  K 3.9 3.2* 3.3*  CL 97* 96* 97*  CO2 35* 35* 34*  GLUCOSE 121* 126* 118*  BUN 34* 41* 50*  CREATININE 0.77 0.86 0.89  CALCIUM 8.8* 9.1 9.1  MG 1.8 2.1  --   PHOS 2.2* 3.1  --    Liver  Function Tests  Recent Labs  03/10/16 0500  AST 36  ALT 124*  ALKPHOS 90  BILITOT 1.1  PROT 6.1*  ALBUMIN 2.7*   No results for input(s): LIPASE, AMYLASE in the last 72 hours. Cardiac Enzymes No results for input(s): CKTOTAL, CKMB, CKMBINDEX, TROPONINI in the last 72 hours. BNP Invalid input(s): POCBNP D-Dimer No results for input(s): DDIMER in the last 72 hours. Hemoglobin A1C No results for input(s): HGBA1C in the last 72 hours. Fasting Lipid Panel No results for input(s): CHOL, HDL, LDLCALC, TRIG, CHOLHDL, LDLDIRECT in the last 72 hours. Thyroid Function Tests No results for input(s): TSH, T4TOTAL, T3FREE, THYROIDAB in the last 72 hours.  Invalid input(s): FREET3  Telemetry    Rapid regular rhythm. Suspect atrial flutter with 2 to one conduction. - Personally Reviewed  ECG    No new data. Last tracing 03/05/2016 suggests a regular rhythm and either sinus tachycardia or 3:1 AV conduction of atrial flutter. - Personally Reviewed.  Radiology    Dg Chest Port 1 View  Result Date: 03/11/2016 CLINICAL DATA:  Acute respiratory failure EXAM: PORTABLE CHEST 1 VIEW COMPARISON:  03/10/2016 FINDINGS: Cardiac shadow is stable. An endotracheal tube, nasogastric catheter and right PICC line are again seen and stable. New mild left basilar atelectasis is seen. Persistent right pleural effusion is noted. IMPRESSION: New left basilar atelectasis.  The right pleural effusion is stable. Electronically Signed   By: Inez Catalina M.D.   On: 03/11/2016 07:37   Dg Chest Port 1 View  Result Date: 03/10/2016 CLINICAL DATA:  Check endotracheal tube placement EXAM: PORTABLE CHEST 1 VIEW COMPARISON:  03/07/2016 FINDINGS: Cardiac shadow is stable. A right-sided PICC line, endotracheal tube and nasogastric catheter are again seen and stable. Calcified granuloma is noted in the right lung base. Right pleural effusion is noted posteriorly. No focal infiltrate is seen. Healing right rib fractures are  noted. IMPRESSION: Tubes and lines as described above. Right-sided pleural effusion. Electronically Signed   By: Inez Catalina M.D.   On: 03/10/2016 08:15    Cardiac Studies   Echocardiogram performed 03/05/16: Study Conclusions  - Procedure narrative: Transthoracic echocardiography. Image   quality was adequate. The study was technically difficult. - Left ventricle: The cavity size was moderately dilated. Systolic   function was severely reduced. The estimated ejection fraction   was in the range of 25% to 30%. Severe diffuse hypokinesis. The  study is not technically sufficient to allow evaluation of LV   diastolic function. - Left atrium: The atrium was severely dilated. - Right atrium: The atrium was mildly dilated.  Patient Profile     80 year old gentleman presenting with acute on chronic respiratory failure, history of chronic atrial fibrillation with rapid ventricular response, subsequent high-grade AV block, subsequent intubation after aggressive IV fluids in the setting of sepsis led to CHF.  Assessment & Plan    1. Atrial fibrillation/atrial flutter, with chronic history of such. Currently with rapid heart rate and using low dose amiodarone and beta blocker therapy for rate control. 2. Hypokalemia, needs repletion especially in the setting of amiodarone therapy. 3. Acute on chronic combined systolic and diastolic heart failure, continue diuresis as tolerated based upon clinical volume status and hemodynamics. 4. Respiratory failure, multifactorial related to heart failure and hand chronic pulmonary disease. 5. Overall, very tenuous clinical situation is a 79 year old gentleman with pulmonary fibrosis, prior smoking history, heart failure, chronic atrial fibrillation, malnutrition, attempting to recover from sepsis. Overall prognosis remains very guarded.   Signed, Sinclair Grooms, MD  03/11/2016, 8:34 AM \

## 2016-03-11 NOTE — Progress Notes (Signed)
Patient and family would like to talk with physician about extubation. RN has paged both the student and NP on the case, waiting for response.

## 2016-03-11 NOTE — Procedures (Signed)
Extubation Procedure Note  Patient Details:   Name: Chad Rogers DOB: 1935-08-19 MRN: [id number]   Airway Documentation:     Evaluation  O2 sats: stable throughout Complications: No apparent complications Patient did tolerate procedure well. Bilateral Breath Sounds: Diminished   Yes   Pt extubated to 3L [place] per MD order. Pt stable throughout with no complications. Pt has strong productive cough and is using yankauer to clear secretions. Pt able to speak with no complications. RT will continue to monitor.   Jesse Sans 03/11/2016, 2:50 PM

## 2016-03-11 NOTE — Progress Notes (Signed)
PULMONARY / CRITICAL CARE MEDICINE   Name: Chad Rogers MRN: [id number] DOB: May 11, 1936    ADMISSION DATE:  03/01/2016 CONSULTATION DATE:  03/04/16  REFERRING MD:  03/04/16  CHIEF COMPLAINT:  Dr. Wyline Copas  Brief Summary:   80 y/o M, smoker, with PMH of ETOH abuse, allergic rhinitis, psoriasis, skin cancer, osteoarthritis, CAD, HTN, atrial fibrillation (not on anticoagulation, Hx falls), systolic CHF (EF [id number]), HLD, pulmonary fibrosis and COPD (previously followed by Dr. Melvyn Novas - 2011) admitted to [facility] on 03/19/2016 complaints of SOB.    The patient recently had a prolonged hospitalization after a fall with several rib fractures.  During that admit, he had a prolonged intubation requiring tracheostomy and admission to an LTAC followed by rehab. The patient was apparently discharged one week prior to presentation.  He reported nausea, vomiting, weakness initially that resolved.  Subsequently, he developed worsening shortness of breath & DOE.  On admit, he was hypotensive, hypoxic and had an elevated lactic acid (7).  Work up concerning for new infiltrate & volume depletion with AKI.  The patient was admitted per [facility] for further care.  He responded well to IVF's and oxygen initially.  He was later found to have pulmonary edema with associated effusions and treated with Lasix.  Follow up CXR evaluation showed enlarging effusions.  Cardiology was consulted for evaluation.  During their evaluation, he was found to have pronounced bradycardia and pauses.  Atropine was administered and he was started on an external pacemaker.  HR was labile and he was started on dopamine.  He had subsequent respiratory distress with hypoxemia (sats dropped to 70's) with altered level of consciousness and required intubation.  He developed further metabolic derangements ([id number], CO2 18, Hgb 9.7) 10/16 and was transferred to [facility] for further care.   SUBJECTIVE: No acute events over night. Pt currently weaning on  vent. He responds appropriately to questions. Indicated he is having abdominal pain. Per nursing has had 2 BM this morning and also passing flatus.    VITAL SIGNS: BP 99/77   Pulse 70   Temp 99.6 F (37.6 C) (Oral)   Resp 20   Ht 5\' 10"  (1.778 m)   Wt 66.4 kg (146 lb 6.2 oz)   SpO2 92%   BMI 21.00 kg/m   HEMODYNAMICS:    VENTILATOR SETTINGS: Vent Mode: PRVC FiO2 (%):  [30 %] 30 % Set Rate:  [16 bmp] 16 bmp Vt Set:  [580 mL] 580 mL PEEP:  [5 cmH20] 5 cmH20 Plateau Pressure:  [13 cmH20-17 cmH20] 13 cmH20  INTAKE / OUTPUT: I/O last 3 completed shifts: In: 2996 [I.V.:80; NG/GT:2490; IV Piggyback:426] Out: 2700 [Urine:2700]  PHYSICAL EXAMINATION: General:  No acute distress, resting in bed, on ventilator  Neuro:  CN II-XII grossly intact, alert, moves all 4 extremities  HEENT:  PEERL, EOMI, no scleral icterus Cardiovascular:  S1/S2, RRR no mrg Lungs:  CTAB, Mild bilateral basilar crackles, respitrations even and unlabored  Abdomen:  LLQ tenderness, mild guarding, soft, bowel sounds in all 4 quadrants Musculoskeletal:  No edema, no gross deformities Skin: dry, scaly, warm, no rashes or bruising   LABS:  BMET  Recent Labs Lab 03/09/16 0528 03/10/16 0500 03/11/16 0354  NA 139 141 141  K 3.9 3.2* 3.3*  CL 97* 96* 97*  CO2 35* 35* 34*  BUN 34* 41* 50*  CREATININE 0.77 0.86 0.89  GLUCOSE 121* 126* 118*    Electrolytes  Recent Labs Lab 03/08/16 0500 03/09/16 0528 03/10/16 0500 03/11/16  0354  CALCIUM 8.5* 8.8* 9.1 9.1  MG 1.9 1.8 2.1  --   PHOS 1.6* 2.2* 3.1  --     CBC  Recent Labs Lab 03/20/2016 0528 03/10/16 0500 03/11/16 0354  WBC 8.3 9.0 9.1  HGB 9.0* 9.3* 9.4*  HCT 29.8* 31.3* 31.1*  PLT 188 183 170    Coag's No results for input(s): APTT, INR in the last 168 hours.  Sepsis Markers  Recent Labs Lab 03/04/16 2006 03/05/16 0327 03/06/16 0425  LATICACIDVEN 1.5  --   --   PROCALCITON 0.26 0.32 0.19    ABG  Recent Labs Lab  03/04/16 1815 03/04/16 2119 03/05/16 0458  PHART 7.363 7.356 7.390  PCO2ART 33.3 35.2 36.6  PO2ART 176* 177.0* 151*    Liver Enzymes  Recent Labs Lab 03/04/16 2006 03/06/16 0425 03/10/16 0500  AST 507* 137* 36  ALT 647* 421* 124*  ALKPHOS 119 111 90  BILITOT 2.1* 1.9* 1.1  ALBUMIN 2.6* 2.4* 2.7*    Cardiac Enzymes  Recent Labs Lab 03/04/16 2006  TROPONINI 0.05*    Glucose  Recent Labs Lab 03/10/16 1132 03/10/16 1620 03/10/16 2101 03/10/16 2333 03/11/16 0353 03/11/16 0759  GLUCAP 151* 115* 125* 124* 129* 116*    Imaging Dg Chest Port 1 View  Result Date: 03/11/2016 CLINICAL DATA:  Acute respiratory failure EXAM: PORTABLE CHEST 1 VIEW COMPARISON:  03/10/2016 FINDINGS: Cardiac shadow is stable. An endotracheal tube, nasogastric catheter and right PICC line are again seen and stable. New mild left basilar atelectasis is seen. Persistent right pleural effusion is noted. IMPRESSION: New left basilar atelectasis.  The right pleural effusion is stable. Electronically Signed   By: Inez Catalina M.D.   On: 03/11/2016 07:37     STUDIES:  CXR 10/16 > mild pulmonary edema with right pleural effusion and left basilar atx. CXR 10/23 > New left basilar atelectasis.  The right pleural effusion is stable.  CULTURES: Blood 10/11 > (-)  Sputum 10/16 > (-)  ANTIBIOTICS: Vanc 10/11 > 10/16 Cefepime 10/11 > 10/19  SIGNIFICANT EVENTS: 10/11  admitted to [facility]. 10/16  transferred to [facility]. 10/17  ef 25%; baseline 10/20  Dobutamine being weaned off.  03-21-23  Cards deceased lasix. Renal function stable. Off dobutamine. Failed SBT with NSVT 10/22  Weaned 3 hours 10/23 Currently weaning   LINES/TUBES: ETT 10/16 >  DISCUSSION: 80 y.o.malewith multiple co-morbidities, admitted by [facility] 03/07/2016 with SOB suspected due to HCAP vs pulmonary edema. On 10/16, developed worsening hypoxia along with profound bradycardia requiring atropine, TC pacing, dopamine. Required intubation due  to respiratory distress and AMS. Later transferred to [facility] for further evaluation and management.   ASSESSMENT / PLAN: PULMONARY A: Acute hypoxic respiratory failure - s/p emergent intubation 10/16 for AMS, likely related to bradycardia Acute Pulmonary edema / Acute sCHF exacerbation  Possible HCAP - bibasilar vs atelectasis. Completed 8days of cefepime (10/19) Hx pulmonary fibrosis, COPD - followed by Dr. Melvyn Novas. Tobacco dependence. P:  Full vent support -> might be heading to trach.  Daily wean. Cont diuresis per cards VAP prevention measures. Budesonide in lieu of preadmission Breo. DuoNebs scheduled / Albuterol PRN. Tobacco cessation counseling once extubated.  CARDIOVASCULAR A:  Cardiogenic shock - improved Acute on Chronic CHF systolic exacerbation, EF 25-30% Pulmonary edema Demand ischemia Afib/A flutter Sinus bradycardia with pauses - s/p atropine and external pacing followed by dobutamine, resolved. NSVT - during PSV wean, resolved P:  Cardiology following, appreciate input Cont diuresis, lasix 40 mg BID Tele monitoring  Negative balance as BP/renal function permit   RENAL A:  AKI Hyponatremia - presumed hypervolemic in setting CHF, improving Hypokalemia Hypomagnesemia Hypophosphatemia P: Diuretics per cardiology Trend BMP / UOP Replace electrolytes as indicated   GASTROINTESTINAL A:  GI prophylaxis. Nutrition. Transaminitis likely from [facility] 2/2 CHF P: SUP: Pantoprazole. Cont TF  HEMATOLOGIC A:  VTE Prophylaxis. P:  SCD's / heparin. CBC in AM.  INFECTIOUS A:  HCAP - completed 8d of cefepime.  P:  Observe off Abx. Monitor fever curve / WBC  ENDOCRINE A:  No acute issues. P: Monitor glucose on BMP.  NEUROLOGIC A:  Acute encephalopathy. Hx ETOH abuse. P: PRN fentanyl for pain PRN versed for sedation  RASS goal: 0 to -1. Daily WUA. Thiamine / Folate.  Hold preadmission mirtazapine, quetiapine.  Start  sertraline at [facility] 03/08/16. - monitor Qtc   FAMILY  - Updates: No family at bedside, per pt wife coming at 10:45  - Inter-disciplinary family meet or Palliative Care meeting due by:  03/11/16  Quin Hoop, MS4 Attending: Dr. Titus Mould Pulmonary and [place] Pager: [phone]  03/11/2016, 10:01 AM   STAFF NOTE: Linwood Dibbles, MD FACP have personally reviewed patient's available data, including medical history, events of note, physical examination and test results as part of my evaluation. I have discussed with resident/NP and other care providers such as pharmacist, RN and RRT. In addition, I personally evaluated patient and elicited key findings of: IMproved lung sounds, ronchi base rt, reduced slight, pcxr with improved aeration indication prior infiltrates as atx, crt can tolerate further neg balance and was pos 290 cc last 24 hours, need increase lasix, k supp, chem in am , weaning cpap 5 ps 5, failed required 8 , re assess if able to get back to 5/5, may need trach, I am impressed about his atx waxing and waning, add mucomysts x 48 hrs, chest pt, repeat pcxr in am , abx completed, low threshold rest6art The patient is critically ill with multiple organ systems failure and requires high complexity decision making for assessment and support, frequent evaluation and titration of therapies, application of advanced monitoring technologies and extensive interpretation of multiple databases.   Critical Care Time devoted to patient care services described in this note is 30 Minutes. This time reflects time of care of this signee: Merrie Roof, MD FACP. This critical care time does not reflect procedure time, or teaching time or supervisory time of PA/NP/Med student/Med Resident etc but could involve care discussion time. Rest per NP/medical resident whose note is outlined above and that I agree with   Lavon Paganini. Titus Mould, MD, [place] Pgr:  [facility] 03/11/2016 12:33 PM

## 2016-03-12 LAB — GLUCOSE, CAPILLARY
GLUCOSE-CAPILLARY: 135 mg/dL — AB (ref 65–99)
Glucose-Capillary: 108 mg/dL — ABNORMAL HIGH (ref 65–99)
Glucose-Capillary: 110 mg/dL — ABNORMAL HIGH (ref 65–99)
Glucose-Capillary: 123 mg/dL — ABNORMAL HIGH (ref 65–99)
Glucose-Capillary: 157 mg/dL — ABNORMAL HIGH (ref 65–99)

## 2016-03-12 LAB — BASIC METABOLIC PANEL
Anion gap: 12 (ref 5–15)
BUN: 52 mg/dL — ABNORMAL HIGH (ref 6–20)
CALCIUM: 9.3 mg/dL (ref 8.9–10.3)
CO2: 32 mmol/L (ref 22–32)
CREATININE: 1.07 mg/dL (ref 0.61–1.24)
Chloride: 99 mmol/L — ABNORMAL LOW (ref 101–111)
Glucose, Bld: 102 mg/dL — ABNORMAL HIGH (ref 65–99)
Potassium: 4.3 mmol/L (ref 3.5–5.1)
Sodium: 143 mmol/L (ref 135–145)

## 2016-03-12 LAB — CBC
HCT: 34.8 % — ABNORMAL LOW (ref 39.0–52.0)
Hemoglobin: 10.3 g/dL — ABNORMAL LOW (ref 13.0–17.0)
MCH: 31.4 pg (ref 26.0–34.0)
MCHC: 29.6 g/dL — AB (ref 30.0–36.0)
MCV: 106.1 fL — ABNORMAL HIGH (ref 78.0–100.0)
PLATELETS: 201 10*3/uL (ref 150–400)
RBC: 3.28 MIL/uL — AB (ref 4.22–5.81)
RDW: 18.7 % — ABNORMAL HIGH (ref 11.5–15.5)
WBC: 11 10*3/uL — ABNORMAL HIGH (ref 4.0–10.5)

## 2016-03-12 LAB — OCCULT BLOOD X 1 CARD TO LAB, STOOL: FECAL OCCULT BLD: POSITIVE — AB

## 2016-03-12 LAB — PHOSPHORUS: PHOSPHORUS: 4.3 mg/dL (ref 2.5–4.6)

## 2016-03-12 LAB — MAGNESIUM: MAGNESIUM: 2.3 mg/dL (ref 1.7–2.4)

## 2016-03-12 MED ORDER — IPRATROPIUM-ALBUTEROL 0.5-2.5 (3) MG/3ML IN SOLN
3.0000 mL | Freq: Three times a day (TID) | RESPIRATORY_TRACT | Status: DC
  Administered 2016-03-12 – 2016-03-13 (×5): 3 mL via RESPIRATORY_TRACT
  Filled 2016-03-12 (×5): qty 3

## 2016-03-12 MED ORDER — VITAMIN B-1 100 MG PO TABS
100.0000 mg | ORAL_TABLET | Freq: Every day | ORAL | Status: DC
  Administered 2016-03-13 – 2016-03-16 (×4): 100 mg via ORAL
  Filled 2016-03-12 (×4): qty 1

## 2016-03-12 MED ORDER — INSULIN ASPART 100 UNIT/ML ~~LOC~~ SOLN: 0.0000 [IU] | Freq: Every day | SUBCUTANEOUS | Status: DC

## 2016-03-12 MED ORDER — FUROSEMIDE 40 MG PO TABS
40.0000 mg | ORAL_TABLET | Freq: Two times a day (BID) | ORAL | Status: DC
  Administered 2016-03-12 – 2016-03-13 (×2): 40 mg via ORAL
  Filled 2016-03-12 (×2): qty 1

## 2016-03-12 MED ORDER — IBUPROFEN 200 MG PO TABS
400.0000 mg | ORAL_TABLET | Freq: Three times a day (TID) | ORAL | Status: DC
  Filled 2016-03-12: qty 2

## 2016-03-12 MED ORDER — FOLIC ACID 1 MG PO TABS
1.0000 mg | ORAL_TABLET | Freq: Every day | ORAL | Status: DC
  Administered 2016-03-13 – 2016-03-16 (×4): 1 mg via ORAL
  Filled 2016-03-12 (×4): qty 1

## 2016-03-12 MED ORDER — IBUPROFEN 200 MG PO TABS
400.0000 mg | ORAL_TABLET | Freq: Three times a day (TID) | ORAL | Status: DC
  Administered 2016-03-12 – 2016-03-13 (×2): 400 mg via ORAL
  Filled 2016-03-12: qty 2

## 2016-03-12 MED ORDER — FAMOTIDINE 20 MG PO TABS
20.0000 mg | ORAL_TABLET | Freq: Two times a day (BID) | ORAL | Status: DC
  Administered 2016-03-12 – 2016-03-13 (×2): 20 mg via ORAL
  Filled 2016-03-12 (×2): qty 1

## 2016-03-12 MED ORDER — ORAL CARE MOUTH RINSE
15.0000 mL | Freq: Two times a day (BID) | OROMUCOSAL | Status: DC
  Administered 2016-03-12 – 2016-03-16 (×7): 15 mL via OROMUCOSAL

## 2016-03-12 MED ORDER — POTASSIUM CHLORIDE 10 MEQ/50ML IV SOLN
10.0000 meq | INTRAVENOUS | Status: DC
Start: 2016-03-12 — End: 2016-03-12
  Administered 2016-03-12 (×5): 10 meq via INTRAVENOUS
  Filled 2016-03-12 (×5): qty 50

## 2016-03-12 MED ORDER — INSULIN ASPART 100 UNIT/ML ~~LOC~~ SOLN
0.0000 [IU] | Freq: Three times a day (TID) | SUBCUTANEOUS | Status: DC
  Administered 2016-03-12 – 2016-03-13 (×2): 2 [IU] via SUBCUTANEOUS
  Administered 2016-03-13 – 2016-03-15 (×4): 1 [IU] via SUBCUTANEOUS
  Administered 2016-03-16: 2 [IU] via SUBCUTANEOUS
  Administered 2016-03-16: 1 [IU] via SUBCUTANEOUS

## 2016-03-12 MED ORDER — ENSURE ENLIVE PO LIQD
237.0000 mL | Freq: Four times a day (QID) | ORAL | Status: DC
  Administered 2016-03-12 – 2016-03-15 (×9): 237 mL via ORAL

## 2016-03-12 MED ORDER — PANTOPRAZOLE SODIUM 40 MG PO TBEC
40.0000 mg | DELAYED_RELEASE_TABLET | Freq: Every day | ORAL | Status: DC
  Administered 2016-03-12 – 2016-03-16 (×5): 40 mg via ORAL
  Filled 2016-03-12 (×5): qty 1

## 2016-03-12 MED ORDER — ADULT MULTIVITAMIN W/MINERALS CH
1.0000 | ORAL_TABLET | Freq: Every day | ORAL | Status: DC
Start: 2016-03-13 — End: 2016-03-16
  Administered 2016-03-13 – 2016-03-16 (×4): 1 via ORAL
  Filled 2016-03-12 (×4): qty 1

## 2016-03-12 NOTE — Progress Notes (Signed)
[facility] Progress Note Patient Name: Chad Rogers DOB: April 21, 1936 MRN: [id number]   Date of Service  03/12/2016  HPI/Events of Note  Repeat K is still 3.3  eICU Interventions  Replete K 70meq IV x 6 Follow BMP in AM     Intervention Category Minor Interventions: Electrolytes abnormality - evaluation and management  Nealy Hickmon 03/12/2016, 12:23 AM

## 2016-03-12 NOTE — Progress Notes (Signed)
Patient Name: Chad Rogers Date of Encounter: 03/12/2016  Primary Cardiologist: Dorris Carnes, M.D.  Hospital Problem List     Principal Problem:   Sepsis [facility]) Active Problems:   Hypertension   Pulmonary fibrosis ([place])   Tobacco abuse   Traumatic fracture of ribs with pneumothorax   Multiple rib fractures   Acute on chronic systolic congestive heart failure (HCC)   Atrial fibrillation, persistent (Chad Rogers)   Debility   HCAP (healthcare-associated pneumonia)   Protein-calorie malnutrition, severe   Metabolic acidosis   CHF (congestive heart failure) (HCC)   Endotracheally intubated   Acute encephalopathy   AKI (acute kidney injury) (Chad Rogers)   Hyperkalemia   Pressure injury of skin   Acute hypoxemic respiratory failure (HCC)   Acute pulmonary edema (HCC)   Acute combined systolic and diastolic heart failure (HCC)   Cardiogenic shock (HCC)     Subjective   Extubated, frail, in no acute distress. No chest pain.  Inpatient Medications    Scheduled Meds: . amiodarone  100 mg Oral Daily  . budesonide (PULMICORT) nebulizer solution  0.5 mg Nebulization BID  . chlorhexidine gluconate (MEDLINE KIT)  15 mL Mouth Rinse BID  . [START ON 73/53/2992] folic acid  1 mg Oral Daily  . furosemide  40 mg Oral BID  . heparin  5,000 Units Subcutaneous Q8H  . hydrocortisone cream   Topical BID  . insulin aspart  0-5 Units Subcutaneous QHS  . insulin aspart  0-9 Units Subcutaneous TID WC  . ipratropium-albuterol  3 mL Nebulization TID  . mouth rinse  15 mL Mouth Rinse 10 times per day  . metoprolol tartrate  25 mg Oral BID  . mirtazapine  15 mg Oral QHS  . [START ON 03/13/2016] multivitamin with minerals  1 tablet Oral Daily  . pantoprazole  40 mg Oral Daily  . sodium chloride flush  10-40 mL Intracatheter Q12H  . [START ON 03/13/2016] thiamine  100 mg Oral Daily   Continuous Infusions: . sodium chloride 10 mL/hr at 03/12/16 0800   PRN Meds: albuterol, sodium chloride flush    Vital Signs    Vitals:   03/12/16 0600 03/12/16 0800 03/12/16 0814 03/12/16 1000  BP: 109/71   (!) 85/71  Pulse:    (!) 106  Resp: (!) 28   (!) 27  Temp:  97.4 F (36.3 C)    TempSrc:  Oral    SpO2: 99%  94% 100%  Weight:      Height:        Intake/Output Summary (Last 24 hours) at 03/12/16 1125 Last data filed at 03/12/16 1000  Gross per 24 hour  Intake          1132.67 ml  Output              625 ml  Net           507.67 ml   Filed Weights   03/10/16 0500 03/11/16 0500 03/12/16 0452  Weight: 141 lb 15.6 oz (64.4 kg) 146 lb 6.2 oz (66.4 kg) 141 lb 1.5 oz (64 kg)    Physical Exam    GEN: Well nourished, well developed, in no acute distress.  HEENT: Grossly normal.  Neck: Supple, no JVD, carotid bruits, or masses. Cardiac: RRR, no murmurs, rubs, or gallops. No clubbing, cyanosis, edema.  Radials/DP/PT 2+ and equal bilaterally.  Respiratory:  Respirations regular and unlabored, clear to auscultation bilaterally. GI: Soft, nontender, nondistended, BS + x 4. MS: no deformity or  atrophy. Skin: warm and dry, no rash. Neuro:  Strength and sensation are intact. Psych: AAOx3.  Normal affect.  Labs    CBC  Recent Labs  03/11/16 0354 03/12/16 0406  WBC 9.1 11.0*  HGB 9.4* 10.3*  HCT 31.1* 34.8*  MCV 105.1* 106.1*  PLT 170 784   Basic Metabolic Panel  Recent Labs  03/10/16 0500  03/11/16 2205 03/12/16 0406  NA 141  < > 144 143  K 3.2*  < > 3.3* 4.3  CL 96*  < > 100* 99*  CO2 35*  < > 33* 32  GLUCOSE 126*  < > 113* 102*  BUN 41*  < > 49* 52*  CREATININE 0.86  < > 0.95 1.07  CALCIUM 9.1  < > 9.3 9.3  MG 2.1  --   --  2.3  PHOS 3.1  --   --  4.3  < > = values in this interval not displayed. Liver Function Tests  Recent Labs  03/10/16 0500  AST 36  ALT 124*  ALKPHOS 90  BILITOT 1.1  PROT 6.1*  ALBUMIN 2.7*   No results for input(s): LIPASE, AMYLASE in the last 72 hours. Cardiac Enzymes No results for input(s): CKTOTAL, CKMB, CKMBINDEX,  TROPONINI in the last 72 hours. BNP Invalid input(s): POCBNP D-Dimer No results for input(s): DDIMER in the last 72 hours. Hemoglobin A1C No results for input(s): HGBA1C in the last 72 hours. Fasting Lipid Panel No results for input(s): CHOL, HDL, LDLCALC, TRIG, CHOLHDL, LDLDIRECT in the last 72 hours. Thyroid Function Tests No results for input(s): TSH, T4TOTAL, T3FREE, THYROIDAB in the last 72 hours.  Invalid input(s): FREET3  Telemetry    Narrow complex regular rhythm at a rate of 104 bpm - Personally Reviewed  ECG    No new tracing - Personally Reviewed older tracings  Radiology    Dg Chest Port 1 View  Result Date: 03/11/2016 CLINICAL DATA:  Acute respiratory failure EXAM: PORTABLE CHEST 1 VIEW COMPARISON:  03/10/2016 FINDINGS: Cardiac shadow is stable. An endotracheal tube, nasogastric catheter and right PICC line are again seen and stable. New mild left basilar atelectasis is seen. Persistent right pleural effusion is noted. IMPRESSION: New left basilar atelectasis.  The right pleural effusion is stable. Electronically Signed   By: Inez Catalina M.D.   On: 03/11/2016 07:37    Cardiac Studies   Updated echocardiogram performed on 03/05/16: Study Conclusions  - Procedure narrative: Transthoracic echocardiography. Image   quality was adequate. The study was technically difficult. - Left ventricle: The cavity size was moderately dilated. Systolic   function was severely reduced. The estimated ejection fraction   was in the range of 25% to 30%. Severe diffuse hypokinesis. The   study is not technically sufficient to allow evaluation of LV   diastolic function. - Left atrium: The atrium was severely dilated. - Right atrium: The atrium was mildly dilated.   Patient Profile     80 year old gentleman presenting with acute on chronic respiratory failure, history of chronic atrial fibrillation with rapid ventricular response, subsequent high-grade AV block, subsequent  intubation after aggressive IV fluids in the setting of sepsis led to CHF.  Assessment & Plan    1. Atrial fibrillation/atrial flutter, Rate control is acceptable. We have not further increased amiodarone because of prior history of significant bradycardia. 2. Hypokalemia, resolved. Today's value is 4.3. 3. Acute on chronic combined systolic and diastolic heart failure, overall 6 L negative since admission. Current low blood pressure prevents aggressive  diuresis. Currently on 40 mg of Lasix orally. May need to hold diuretics if azotemia increases. 4. Respiratory failure, multifactorial related to heart failure chronic pulmonary fibrosis.Marland Kitchen Extubated and comfortable today. 5. Overall, very tenuous clinical situation is a 80 year old gentleman with pulmonary fibrosis, prior smoking history, heart failure, chronic atrial fibrillation, malnutrition, attempting to recover from sepsis. Overall prognosis remains very guarded. Improved compared to yesterday with extubation. Soft blood pressures prevent aggressive diuresis or increase in medications for heart rate control.  Signed, Sinclair Grooms, MD  03/12/2016, 11:25 AM

## 2016-03-12 NOTE — Evaluation (Signed)
Physical Therapy Evaluation Patient Details Name: Chad Rogers MRN: [id number] DOB: [birth year] Today's Date: 03/12/2016   History of Present Illness  80 y/o M, smoker, with PMH of ETOH abuse, allergic rhinitis, psoriasis, skin cancer, osteoarthritis, CAD, HTN, atrial fibrillation (not on anticoagulation, Hx falls), systolic CHF (EF 123456), HLD, pulmonary fibrosis and COPD (previously followed by Dr. Melvyn Novas - 2011) admitted to [facility] on 03/02/2016 complaints of SOB. The patient recently had a prolonged hospitalization after a fall with several rib fractures.  During that admit, he had a prolonged intubation requiring tracheostomy and admission to an LTAC followed by rehab. The patient was apparently discharged one week prior to presentation.  He reported nausea, vomiting, weakness initially that resolved.  Subsequently, he developed worsening shortness of breath & DOE.  On admit, he was hypotensive, hypoxic and had an elevated lactic acid (7).  Work up concerning for new infiltrate & volume depletion with AKI.  The patient was admitted per [facility] for further care.  He responded well to IVF's and oxygen initially.  He was later found to have pulmonary edema with associated effusions and treated with Lasix.  Follow up CXR evaluation showed enlarging effusions.  Cardiology was consulted for evaluation.  During their evaluation, he was found to have pronounced bradycardia and pauses.  Atropine was administered and he was started on an external pacemaker.  HR was labile and he was started on dopamine.  He had subsequent respiratory distress with hypoxemia (sats dropped to 70's) with altered level of consciousness and required intubation.  He developed further metabolic derangements (123XX123, CO2 18, Hgb 9.7) 10/16 and was transferred to [facility] for further care.   Clinical Impression  Pt admitted with above diagnosis. Pt currently with functional limitations due to the deficits listed below (see PT Problem  List). Pt was able to stand and pivot to 3N1 and have BM. Then transferred to recliner.  Needed mod assist due to instability.  Should progress and be able to go home with wife's assist.  Will follow acutely.  Pt will benefit from skilled PT to increase their independence and safety with mobility to allow discharge to the venue listed below.    Follow Up Recommendations Home health PT;Supervision/Assistance - 24 hour (HHOT and HHAide)    Equipment Recommendations  None recommended by PT    Recommendations for Other Services       Precautions / Restrictions Precautions Precautions: Fall Precaution Comments: monitor BP and sats Restrictions Weight Bearing Restrictions: No      Mobility  Bed Mobility Overal bed mobility: Needs Assistance Bed Mobility: Supine to Sit     Supine to sit: Min assist     General bed mobility comments: assisst for LEs and elevation of trunk  Transfers Overall transfer level: Needs assistance Equipment used: 2 person hand held assist Transfers: Sit to/from [facility] Sit to Stand: Mod assist Stand pivot transfers: Mod assist       General transfer comment: Pt generally unsteady but was able to stand with mod assist and turn and get on 3N1. Used potty having BM and then stood while nursing cleaned pt and pt stabilizing on PT UES and then pivot to chair with mod assist for stability with bil UE support to get to recliner.    Ambulation/Gait                Stairs            Wheelchair Mobility    Modified Rankin (  Stroke Patients Only)       Balance Overall balance assessment: Needs assistance Sitting-balance support: No upper extremity supported;Feet supported Sitting balance-Leahy Scale: Fair     Standing balance support: Bilateral upper extremity supported;During functional activity Standing balance-Leahy Scale: Poor Standing balance comment: relies on bil UE support                              Pertinent Vitals/Pain Pain Assessment: No/denies pain  VSS with pt on 2LO2.    Home Living Family/patient expects to be discharged to:: Private residence Living Arrangements: Spouse/significant other;Children Available Help at Discharge: Family;Available 24 hours/day (wife and grandson) Type of Home: Mobile home Home Access: Stairs to enter Entrance Stairs-Rails: Right;Left;Can reach both Entrance Stairs-Number of Steps: 5-6 step entry Home Layout: One level Home Equipment: Walker - 2 wheels;Bedside commode      Prior Function Level of Independence: Independent with assistive device(s);Needs assistance   Gait / Transfers Assistance Needed: was walking in house with RW without assist.  ADL's / Homemaking Assistance Needed: was sponge bathing since last hospital admit        Hand Dominance   Dominant Hand: Left    Extremity/Trunk Assessment   Upper Extremity Assessment: Defer to OT evaluation           Lower Extremity Assessment: RLE deficits/detail;LLE deficits/detail RLE Deficits / Details: grossly 3-/5 LLE Deficits / Details: grossly 3-/5  Cervical / Trunk Assessment: Kyphotic  Communication   Communication: No difficulties  Cognition Arousal/Alertness: Awake/alert Behavior During Therapy: WFL for tasks assessed/performed;Flat affect Overall Cognitive Status: Within Functional Limits for tasks assessed                      General Comments      Exercises     Assessment/Plan    PT Assessment Patient needs continued PT services  PT Problem List Decreased activity tolerance;Decreased balance;Decreased strength;Decreased mobility;Decreased knowledge of use of DME;Decreased safety awareness;Decreased knowledge of precautions          PT Treatment Interventions DME instruction;Gait training;Functional mobility training;Therapeutic activities;Therapeutic exercise;Balance training;Patient/family education;Stair training    PT Goals (Current  goals can be found in the Care Plan section)  Acute Rehab PT Goals Patient Stated Goal: to get better PT Goal Formulation: With patient Time For Goal Achievement: 03/26/16 Potential to Achieve Goals: Good    Frequency Min 3X/week   Barriers to discharge        Co-evaluation               End of Session Equipment Utilized During Treatment: Gait belt;Oxygen Activity Tolerance: Patient limited by fatigue Patient left: in chair;with call bell/phone within reach;with chair alarm set Nurse Communication: Mobility status;Need for lift equipment         Time: [id number] PT Time Calculation (min) (ACUTE ONLY): 26 min   Charges:   PT Evaluation $PT Eval Moderate Complexity: 1 Procedure PT Treatments $Therapeutic Activity: 8-22 mins   PT G CodesDenice Paradise 04-05-16, 11:56 AM Jamel Dunton,PT Acute Rehabilitation [phone] (pager)

## 2016-03-12 NOTE — Progress Notes (Signed)
PULMONARY / CRITICAL CARE MEDICINE   Name: Chad Rogers MRN: [id number] DOB: [birth year]    ADMISSION DATE:  03/11/2016 CONSULTATION DATE:  03/04/16  REFERRING MD:  03/04/16  CHIEF COMPLAINT:  Dr. Wyline Copas  Brief Summary:   80 y/o M, smoker, with PMH of ETOH abuse, allergic rhinitis, psoriasis, skin cancer, osteoarthritis, CAD, HTN, atrial fibrillation (not on anticoagulation, Hx falls), systolic CHF (EF 123456), HLD, pulmonary fibrosis and COPD (previously followed by Dr. Melvyn Novas - 2011) admitted to [facility] on 03/14/2016 complaints of SOB.    The patient recently had a prolonged hospitalization after a fall with several rib fractures.  During that admit, he had a prolonged intubation requiring tracheostomy and admission to an LTAC followed by rehab. The patient was apparently discharged one week prior to presentation.  He reported nausea, vomiting, weakness initially that resolved.  Subsequently, he developed worsening shortness of breath & DOE.  On admit, he was hypotensive, hypoxic and had an elevated lactic acid (7).  Work up concerning for new infiltrate & volume depletion with AKI.  The patient was admitted per [facility] for further care.  He responded well to IVF's and oxygen initially.  He was later found to have pulmonary edema with associated effusions and treated with Lasix.  Follow up CXR evaluation showed enlarging effusions.  Cardiology was consulted for evaluation.  During their evaluation, he was found to have pronounced bradycardia and pauses.  Atropine was administered and he was started on an external pacemaker.  HR was labile and he was started on dopamine.  He had subsequent respiratory distress with hypoxemia (sats dropped to 70's) with altered level of consciousness and required intubation.  He developed further metabolic derangements (123XX123, CO2 18, Hgb 9.7) 10/16 and was transferred to [facility] for further care.   SUBJECTIVE: Passed bedside swallow evaluation by nurse overnight and  started on mechanical soft diet without any coughing or difficulty swallowing. Patient does desaturate easily and still has some altered mental status.  REVIEW OF SYSTEMS: Unable to obtain given ongoing encephalopathy.  VITAL SIGNS: BP (!) 85/71   Pulse (!) 106   Temp 97.4 F (36.3 C) (Oral)   Resp (!) 27   Ht 5\' 10"  (1.778 m)   Wt 141 lb 1.5 oz (64 kg)   SpO2 100%   BMI 20.24 kg/m   HEMODYNAMICS:    VENTILATOR SETTINGS: Vent Mode: CPAP;PSV FiO2 (%):  [30 %] 30 % PEEP:  [5 cmH20] 5 cmH20 Pressure Support:  [8 cmH20] 8 cmH20  INTAKE / OUTPUT: I/O last 3 completed shifts: In: 2362.7 [P.O.:120; I.V.:247.7; Other:450; [id number]; IV Piggyback:250] Out: [id number]; Stool:1]  PHYSICAL EXAMINATION: General:  Awake. No acute distress. No family at bedside.  Integument:  Warm & dry. No rash on exposed skin. Bruising of various ages on extremities. HEENT:  No scleral injection or icterus. PERRL. Tracheostomy stoma with dressing over site. Cardiovascular:  Regular rate. No edema. No appreciable JVD.  Pulmonary:  Coarse breath sounds bilaterally. Normal work of breathing on nasal cannula. Abdomen: Soft. Normal bowel sounds. Nondistended. Nontender. Musculoskeletal:  Symmetrically decreased muscle bulk. No joint deformity. Neurology:  No meningismus. CN grossly in tact. Believes the year to be 1998. 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  LABS:  BMET  Recent Labs Lab 03/11/16 0354 03/11/16 2205 03/12/16 0406  NA 141 144 143  K 3.3* 3.3* 4.3  CL 97* 100* 99*  CO2 34* 33* 32  BUN 50* 49*  52*  CREATININE 0.89 0.95 1.07  GLUCOSE 118* 113* 102*    Electrolytes  Recent Labs Lab 28-Mar-2016 0528 03/10/16 0500 03/11/16 0354 03/11/16 2205 03/12/16 0406  CALCIUM 8.8* 9.1 9.1 9.3 9.3  MG 1.8 2.1  --   --  2.3  PHOS 2.2* 3.1  --   --  4.3    CBC  Recent Labs Lab 03/10/16 0500 03/11/16 0354 03/12/16 0406  WBC 9.0 9.1 11.0*  HGB  9.3* 9.4* 10.3*  HCT 31.3* 31.1* 34.8*  PLT 183 170 201    Coag's No results for input(s): APTT, INR in the last 168 hours.  Sepsis Markers  Recent Labs Lab 03/06/16 0425  PROCALCITON 0.19    ABG No results for input(s): PHART, PCO2ART, PO2ART in the last 168 hours.  Liver Enzymes  Recent Labs Lab 03/06/16 0425 03/10/16 0500  AST 137* 36  ALT 421* 124*  ALKPHOS 111 90  BILITOT 1.9* 1.1  ALBUMIN 2.4* 2.7*    Cardiac Enzymes No results for input(s): TROPONINI, PROBNP in the last 168 hours.  Glucose  Recent Labs Lab 03/11/16 1200 03/11/16 1555 03/11/16 1952 03/11/16 2358 03/12/16 0354 03/12/16 0811  GLUCAP 130* 95 102* 109* 108* 110*    Imaging No results found.   STUDIES:  CXR 10/16: mild pulmonary edema with right pleural effusion and left basilar atx. CXR 10/23: New left basilar atelectasis.  The right pleural effusion is stable.  MICROBIOLOGY: Blood Ctx x2 10/11:  Negative Urine Ctx 10/11:  Negative Tracheal Asp Ctx 10/16:  Oral Flora  ANTIBIOTICS: Vancomycin 10/11 - 10/16 Cefepime 10/11 - 10/19  SIGNIFICANT EVENTS: 10/11  admitted to [facility]. 10/16  transferred to [facility]. 10/17  ef 25%; baseline 10/20  Dobutamine being weaned off.  Mar 29, 2023  Cards deceased lasix. Renal function stable. Off dobutamine. Failed SBT with NSVT 10/22  Weaned 3 hours 10/23  Extubated   LINES/TUBES: OETT 10/16 - 10/23 R DL PICC 10/17 >> FOLEY >>  ASSESSMENT / PLAN:  PULMONARY A: Acute Hypoxic Respiratory Failure - Likely due to pulmonary edema. Extubated 10/23 w/ tenuous status. Possible HCAP - S/P tx. H/O Pulmonary Fibrosis/COPD followed by Dr. Melvyn Novas Tobacco Use Disorder  P:  Continuing Budesonide neb bid Continuing Duoneb TID Albuterol Nebs prn Continuing gentle Lasix diuresis Tobacco Cessation Education prior to discharge Incentive Spirometry & Flutter valve for pulmonary toilette  CARDIOVASCULAR A:  Shock - Cardiogenic. Resolved. Acute on Chronic  Systolic CHF - EF 123XX123 Elevated Troponin I - Likely demand ischemia. H/O Atrial Fibrillation/Flutter Sinus Bradycardia w/ Pauses - s/p atropine and external pacing followed by dobutamine, resolved. NSVT - during PSV wean, resolved  P:  Continuous telemetry monitoring Vitals per unit protocol Cardiology following & appreciate input Changing to Lasix 40mg  PO bid Amiodarone PO daily Metoprolol PO bid Negative balance as BP/renal function permit  Limit to [id number] fluids per day  RENAL A:  Acute Renal Failure - Stabilized. Hyponatremia - Resolved. Hypokalemia - Resolved. Hypomagnesemia - Resolved. Hypophosphatemia - Resolved.  P: Trending UOP Monitoring electrolytes & renal function daily Replacing electrolytes as indicated  GASTROINTESTINAL A:  Transaminitis - Likely due to congestion from heart failure. Resolving. H/O EtOH Use  P: Continue Mechanical Soft Diet Switch to Protonix PO daily  HEMATOLOGIC A:  Anemia - Mild. No signs of active bleeding.  P:  Trending cell counts w/ CBC daily SCDs Heparin Sherrill q8hr  INFECTIOUS A:  Sepsis - Possible. HCAP - completed 8d of cefepime.   P:  Monitor for signs/symptoms  of infection  ENDOCRINE A:  Hyperglycemia - No h/o DM.  P: Switching to accu-checks qAC & HS SSI per Sensitive Algorithm Checking Hgb A1c  NEUROLOGIC A:  Acute Encephalopathy - Improving. H/O EtOH Use  P: Discontinuing Versed & Fentanyl IV Switching Thiamine & FA to PO daily Continuing home Remeron & Seroquel qhs Holding home Zoloft  FAMILY  - Updates: No family at bedside 10/24.  - Inter-disciplinary family meet or Palliative Care meeting due by:  03/11/16  TODAY'S SUMMARY:  80 y.o.malewith multiple co-morbidities, admitted by [facility] 02/19/2016 with SOB suspected due to HCAP vs pulmonary edema. On 10/16, developed worsening hypoxia along with profound bradycardia requiring atropine, TC pacing, dopamine. Required  intubation due to respiratory distress and AMS. Later transferred to [facility] for further evaluation and management. Patient's respiratory status still somewhat tenuous with intermittent desaturation post tracheostomy decannulation. Switching patient's diuresis to oral Lasix twice daily with fluid restriction. Patient still having some degree of encephalopathy that is likely multifactorial. PT/OT consulted and following. Given patient's multiple medical problems and high potential for further clinical decompensation requiring reintubation after tracheostomy decannulation with a potentially difficult airway I will continue to monitor the patient in the intensive care unit.  I have spent a total of 34 minutes of critical care time today caring for the patient and reviewing the patient's electronic medical record.  Sonia Baller Ashok Cordia, M.D. [facility] Pulmonary & Critical Care Pager:  [phone] After 3pm or if no response, call [phone] 03/12/2016 10:27 AM

## 2016-03-12 NOTE — Progress Notes (Signed)
Nutrition Follow-up  DOCUMENTATION CODES:   Severe malnutrition in context of acute illness/injury  INTERVENTION:   Ensure Enlive po QID, each supplement provides 350 kcal and 20 grams of protein, pt can have up to 4 ensures per day but will need to limit non-caloric fluids  NUTRITION DIAGNOSIS:   Malnutrition (Severe) related to acute illness as evidenced by moderate depletion of body fat, moderate depletions of muscle mass. Ongoing.   GOAL:   Patient will meet greater than or equal to 90% of their needs Progressing.   MONITOR:   PO intake, Supplement acceptance, Skin, I & O's  ASSESSMENT:   Pt with hx of ETOH abuse and recent prolonged hospitalization after a fall with several rib fractures.  During that admit, he had a prolonged intubation requiring tracheostomy and admission to an LTAC followed by rehab.  The patient was apparently discharged one week prior to presentation.  He reported nausea, vomiting, weakness initially that resolved.  Subsequently, he developed worsening shortness of breath.  Pt transferred to [facility], he was hypotensive, hypoxic and had an elevated lactic acid (7).  Work up concerning for new infiltrate & volume depletion with AKI.   10/23 extubated  Spoke with pt and daughter. Pt does not currently have his dentures, wife will bring them tomorrow.  He has trouble chewing. Reviewed menu with them and helped him determine altered textures he could eat. Did not eat lunch because he could not chew it. He is willing to drink ensure.   Medications reviewed and include: folvite, novolog, lasix, remeron, MVI, thiamine Labs reviewed: PO4 and K+ WNL CBG's: 110-157   Diet Order:  DIET SOFT Room service appropriate? Yes with Assist; Fluid consistency: Thin; Fluid restriction: 1500 mL Fluid  Skin:  Wound (see comment) (stage I sacrum, unstageable on L heel)  Last BM:  10/23  Height:   Ht Readings from Last 1 Encounters:  03/04/16 5\' 10"  (1.778 m)    Weight:    Wt Readings from Last 1 Encounters:  03/12/16 141 lb 1.5 oz (64 kg)    Ideal Body Weight:  75 kg  BMI:  Body mass index is 20.24 kg/m.  Estimated Nutritional Needs:   Kcal:  1900-2100  Protein:  105-115 grams  Fluid:  1.5 L/day  EDUCATION NEEDS:   No education needs identified at this time  [place], [place], [place] Pager [phone] After Hours Pager

## 2016-03-12 NOTE — Progress Notes (Signed)
Report of K+ 3.3 given to [employer] with E-Link, message to be given to MD

## 2016-03-13 ENCOUNTER — Inpatient Hospital Stay (HOSPITAL_COMMUNITY): Payer: Medicare Other

## 2016-03-13 DIAGNOSIS — N182 Chronic kidney disease, stage 2 (mild): Secondary | ICD-10-CM

## 2016-03-13 DIAGNOSIS — N170 Acute kidney failure with tubular necrosis: Secondary | ICD-10-CM

## 2016-03-13 LAB — CBC WITH DIFFERENTIAL/PLATELET
Basophils Absolute: 0.1 10*3/uL (ref 0.0–0.1)
Basophils Relative: 1 %
Eosinophils Absolute: 0.4 10*3/uL (ref 0.0–0.7)
Eosinophils Relative: 4 %
HCT: 30.6 % — ABNORMAL LOW (ref 39.0–52.0)
Hemoglobin: 9.1 g/dL — ABNORMAL LOW (ref 13.0–17.0)
Lymphocytes Relative: 20 %
Lymphs Abs: 2.2 10*3/uL (ref 0.7–4.0)
MCH: 31 pg (ref 26.0–34.0)
MCHC: 29.7 g/dL — ABNORMAL LOW (ref 30.0–36.0)
MCV: 104.1 fL — ABNORMAL HIGH (ref 78.0–100.0)
Monocytes Absolute: 0.6 10*3/uL (ref 0.1–1.0)
Monocytes Relative: 6 %
Neutro Abs: 7.5 10*3/uL (ref 1.7–7.7)
Neutrophils Relative %: 69 %
Platelets: 190 10*3/uL (ref 150–400)
RBC: 2.94 MIL/uL — ABNORMAL LOW (ref 4.22–5.81)
RDW: 18.3 % — ABNORMAL HIGH (ref 11.5–15.5)
WBC: 10.8 10*3/uL — ABNORMAL HIGH (ref 4.0–10.5)

## 2016-03-13 LAB — TYPE AND SCREEN
ABO/RH(D): O NEG
Antibody Screen: NEGATIVE

## 2016-03-13 LAB — HEMOGLOBIN A1C
Hgb A1c MFr Bld: 4.9 % (ref 4.8–5.6)
MEAN PLASMA GLUCOSE: 94 mg/dL

## 2016-03-13 LAB — GLUCOSE, CAPILLARY
GLUCOSE-CAPILLARY: 119 mg/dL — AB (ref 65–99)
Glucose-Capillary: 185 mg/dL — ABNORMAL HIGH (ref 65–99)
Glucose-Capillary: 127 mg/dL — ABNORMAL HIGH (ref 65–99)
Glucose-Capillary: 106 mg/dL — ABNORMAL HIGH (ref 65–99)

## 2016-03-13 LAB — RENAL FUNCTION PANEL
Albumin: 3 g/dL — ABNORMAL LOW (ref 3.5–5.0)
Anion gap: 14 (ref 5–15)
BUN: 63 mg/dL — ABNORMAL HIGH (ref 6–20)
CO2: 30 mmol/L (ref 22–32)
Calcium: 9.2 mg/dL (ref 8.9–10.3)
Chloride: 95 mmol/L — ABNORMAL LOW (ref 101–111)
Creatinine, Ser: 1.29 mg/dL — ABNORMAL HIGH (ref 0.61–1.24)
GFR calc Af Amer: 60 mL/min — ABNORMAL LOW (ref >60)
GFR calc non Af Amer: 51 mL/min — ABNORMAL LOW (ref >60)
Glucose, Bld: 120 mg/dL — ABNORMAL HIGH (ref 65–99)
Phosphorus: 3.6 mg/dL (ref 2.5–4.6)
Potassium: 3.9 mmol/L (ref 3.5–5.1)
Sodium: 139 mmol/L (ref 135–145)

## 2016-03-13 LAB — HEMOGLOBIN AND HEMATOCRIT, BLOOD
HCT: 32.1 % — ABNORMAL LOW (ref 39.0–52.0)
Hemoglobin: 9.7 g/dL — ABNORMAL LOW (ref 13.0–17.0)

## 2016-03-13 LAB — MAGNESIUM: Magnesium: 2 mg/dL (ref 1.7–2.4)

## 2016-03-13 MED ORDER — ACETAMINOPHEN 325 MG PO TABS
650.0000 mg | ORAL_TABLET | Freq: Four times a day (QID) | ORAL | Status: DC | PRN
Start: 2016-03-13 — End: 2016-03-16
  Administered 2016-03-13 – 2016-03-14 (×2): 650 mg via ORAL
  Filled 2016-03-13 (×2): qty 2

## 2016-03-13 NOTE — Progress Notes (Signed)
Physical Therapy Treatment Patient Details Name: Chad Rogers MRN: [id number] DOB: 1935/11/25 Today's Date: 03/13/2016    History of Present Illness 80 y/o M, smoker, with PMH of ETOH abuse, allergic rhinitis, psoriasis, skin cancer, osteoarthritis, CAD, HTN, atrial fibrillation (not on anticoagulation, Hx falls), systolic CHF (EF 123456), HLD, pulmonary fibrosis and COPD (previously followed by Dr. Melvyn Novas - 2011) admitted to [facility] on 03/11/2016 complaints of SOB. The patient recently had a prolonged hospitalization after a fall with several rib fractures.  During that admit, he had a prolonged intubation requiring tracheostomy and admission to an LTAC followed by rehab. The patient was apparently discharged one week prior to presentation.  He reported nausea, vomiting, weakness initially that resolved.  Subsequently, he developed worsening shortness of breath & DOE.  On admit, he was hypotensive, hypoxic and had an elevated lactic acid (7).  Work up concerning for new infiltrate & volume depletion with AKI.  The patient was admitted per [facility] for further care.  He responded well to IVF's and oxygen initially.  He was later found to have pulmonary edema with associated effusions and treated with Lasix.  Follow up CXR evaluation showed enlarging effusions.  Cardiology was consulted for evaluation.  During their evaluation, he was found to have pronounced bradycardia and pauses.  Atropine was administered and he was started on an external pacemaker.  HR was labile and he was started on dopamine.  He had subsequent respiratory distress with hypoxemia (sats dropped to 70's) with altered level of consciousness and required intubation.  He developed further metabolic derangements (123XX123, CO2 18, Hgb 9.7) 10/16 and was transferred to [facility] for further care.     PT Comments    Pt admitted with above diagnosis. Pt currently with functional limitations due to balance and endurance deficits. Pt was able  to ambulate with +2 mod assist and chair follow. Progressing slowly.   Pt will benefit from skilled PT to increase their independence and safety with mobility to allow discharge to the venue listed below.    Follow Up Recommendations  Home health PT;Supervision/Assistance - 24 hour (HHOT and HHAide)     Equipment Recommendations  None recommended by PT    Recommendations for Other Services       Precautions / Restrictions Precautions Precautions: Fall Precaution Comments: monitor BP and sats Restrictions Weight Bearing Restrictions: No    Mobility  Bed Mobility               General bed mobility comments: in chair on arrival  Transfers Overall transfer level: Needs assistance Equipment used: Rolling walker (2 wheeled) Transfers: Sit to/from Stand Sit to Stand: Mod assist;+2 physical assistance         General transfer comment: Pt generally unsteady but was able to stand with mod assist for power up.    Ambulation/Gait Ambulation/Gait assistance: Mod assist;+2 safety/equipment Ambulation Distance (Feet): 16 Feet (5 feet, sitting rest and then 11 feet) Assistive device: Rolling walker (2 wheeled) Gait Pattern/deviations: Step-to pattern;Decreased step length - right;Decreased step length - left;Shuffle;Leaning posteriorly;Drifts right/left;Trunk flexed Gait velocity: decreased   General Gait Details: Pt needed max encouragment to walk due to fatigue.  Pt had to be followed with chair and limited by weakness.  Needed assist to move RW and cues to stay close to it.  Loses postural stability quickly limiting distance.    Stairs            Wheelchair Mobility    Modified Rankin (Stroke Patients  Only)       Balance Overall balance assessment: Needs assistance         Standing balance support: Bilateral upper extremity supported;During functional activity Standing balance-Leahy Scale: Poor Standing balance comment: relies on bil UE support.                     Cognition Arousal/Alertness: Awake/alert Behavior During Therapy: WFL for tasks assessed/performed;Flat affect Overall Cognitive Status: Within Functional Limits for tasks assessed                      Exercises      General Comments        Pertinent Vitals/Pain Pain Assessment: No/denies pain  VSS on 2LO2.      Home Living                      Prior Function            PT Goals (current goals can now be found in the care plan section) Acute Rehab PT Goals Patient Stated Goal: to get better Progress towards PT goals: Progressing toward goals    Frequency    Min 3X/week      PT Plan Current plan remains appropriate    Co-evaluation             End of Session Equipment Utilized During Treatment: Gait belt;Oxygen Activity Tolerance: Patient limited by fatigue Patient left: in chair;with call bell/phone within reach;with chair alarm set     Time: 1035-1050 PT Time Calculation (min) (ACUTE ONLY): 15 min  Charges:  $Gait Training: 8-22 mins                    G CodesDenice Paradise 04/01/2016, 11:59 AM [employer] Acute Rehabilitation [phone] (pager)

## 2016-03-13 NOTE — Progress Notes (Signed)
Patient Name: Chad Rogers Date of Encounter: 03/13/2016  Primary Cardiologist: Dorris Carnes, M.D.  Hospital Problem List     Principal Problem:   Sepsis [facility]) Active Problems:   Hypertension   Pulmonary fibrosis ([place])   Tobacco abuse   Traumatic fracture of ribs with pneumothorax   Multiple rib fractures   Acute on chronic systolic congestive heart failure (HCC)   Atrial fibrillation, persistent ([place])   Debility   HCAP (healthcare-associated pneumonia)   Protein-calorie malnutrition, severe   Metabolic acidosis   CHF (congestive heart failure) (HCC)   Endotracheally intubated   Acute encephalopathy   AKI (acute kidney injury) ([place])   Hyperkalemia   Pressure injury of skin   Acute hypoxemic respiratory failure (HCC)   Acute pulmonary edema (HCC)   Acute combined systolic and diastolic heart failure (HCC)   Cardiogenic shock (HCC)     Subjective   Feels better this morning. He requests more oral fluid intake. He feels thirsty. No chest pain. Denies dyspnea.  Inpatient Medications    Scheduled Meds: . amiodarone  100 mg Oral Daily  . budesonide (PULMICORT) nebulizer solution  0.5 mg Nebulization BID  . famotidine  20 mg Oral BID  . feeding supplement (ENSURE ENLIVE)  237 mL Oral QID  . folic acid  1 mg Oral Daily  . furosemide  40 mg Oral BID  . heparin  5,000 Units Subcutaneous Q8H  . hydrocortisone cream   Topical BID  . ibuprofen  400 mg Oral TID  . insulin aspart  0-5 Units Subcutaneous QHS  . insulin aspart  0-9 Units Subcutaneous TID WC  . ipratropium-albuterol  3 mL Nebulization TID  . mouth rinse  15 mL Mouth Rinse BID  . metoprolol tartrate  25 mg Oral BID  . mirtazapine  15 mg Oral QHS  . multivitamin with minerals  1 tablet Oral Daily  . pantoprazole  40 mg Oral Daily  . sodium chloride flush  10-40 mL Intracatheter Q12H  . thiamine  100 mg Oral Daily   Continuous Infusions: . sodium chloride Stopped (03/12/16 1100)   PRN  Meds: albuterol, sodium chloride flush   Vital Signs    Vitals:   03/13/16 0700 03/13/16 0800 03/13/16 0805 03/13/16 0900  BP: 100/79 107/72  110/74  Pulse: (!) 106 97  (!) 108  Resp:      Temp:   98.9 F (37.2 C)   TempSrc:   Oral   SpO2: 97% 100% 99% 96%  Weight:      Height:        Intake/Output Summary (Last 24 hours) at 03/13/16 1002 Last data filed at 03/13/16 0900  Gross per 24 hour  Intake             1330 ml  Output              600 ml  Net              730 ml   Filed Weights   03/11/16 0500 03/12/16 0452 03/13/16 0500  Weight: 146 lb 6.2 oz (66.4 kg) 141 lb 1.5 oz (64 kg) 145 lb 4.5 oz (65.9 kg)    Physical Exam    GEN: Chronically ill and malnourished appearing. In no acute distress.  HEENT: Grossly normal.  Neck: Supple, no JVD, carotid bruits, or masses. Cardiac: RRR, no murmurs, rubs, or gallops. No clubbing, cyanosis, edema.  Radials/DP/PT 2+ and equal bilaterally.  Respiratory:  Respirations regular and unlabored, clear  to auscultation bilaterally. GI: Soft, nontender, nondistended, BS + x 4. MS: no deformity or atrophy. Skin:. Ecchymoses on arms Neuro:  Strength and sensation are intact. Psych: AAOx3.  Normal affect.  Labs    CBC  Recent Labs  03/12/16 0406 03/13/16 0525  WBC 11.0* 10.8*  NEUTROABS  --  7.5  HGB 10.3* 9.1*  HCT 34.8* 30.6*  MCV 106.1* 104.1*  PLT 201 [id number]   Basic Metabolic Panel  Recent Labs  03/12/16 0406 03/13/16 0525  NA 143 139  K 4.3 3.9  CL 99* 95*  CO2 32 30  GLUCOSE 102* 120*  BUN 52* 63*  CREATININE 1.07 1.29*  CALCIUM 9.3 9.2  MG 2.3 2.0  PHOS 4.3 3.6   Liver Function Tests  Recent Labs  03/13/16 0525  ALBUMIN 3.0*   No results for input(s): LIPASE, AMYLASE in the last 72 hours. Cardiac Enzymes No results for input(s): CKTOTAL, CKMB, CKMBINDEX, TROPONINI in the last 72 hours. BNP Invalid input(s): POCBNP D-Dimer No results for input(s): DDIMER in the last 72 hours. Hemoglobin  A1C  Recent Labs  03/12/16 1130  HGBA1C 4.9   Fasting Lipid Panel No results for input(s): CHOL, HDL, LDLCALC, TRIG, CHOLHDL, LDLDIRECT in the last 72 hours. Thyroid Function Tests No results for input(s): TSH, T4TOTAL, T3FREE, THYROIDAB in the last 72 hours.  Invalid input(s): FREET3  Telemetry    Atrial fibrillation with poor rate control, averaging 105 bpm. - Personally Reviewed  ECG    No new tracing  Radiology    No results found.  Cardiac Studies   No new data  Patient Profile     80 year old gentleman presenting with acute on chronic respiratory failure, history of chronic atrial fibrillation with rapid ventricular response, on chronic low-dose amiodarone(? for rate control) subsequent bradycardia/ high-grade AV block, history of chronic systolic heart failure with EF of 25%, intubation after aggressive IV fluids in the setting of sepsis led to CHF.  Assessment & Plan    1. Atrial fibrillation/atrial flutter, Rate control is marginal. Consider adding low-dose digoxin (will give 2 doses today). 2. Hypokalemia, resolved.  3. Acute on chronic combined systolic and diastolic heart failure, overall 6 L negative since admission. With hypotension and progressive azotemia, will hold by mouth Lasix. Liberalize fluid restriction. 4. Respiratory failure, multifactorial related to heart failure and chronic pulmonary fibrosis.Marland Kitchen Extubated and comfortable today. 5. Acute on chronic kidney injury secondary to diuresis. 6. Warnell Forester tenuous clinical situation is a 80 year old gentleman with pulmonary fibrosis, prior smoking history, heart failure, chronic atrial fibrillation, malnutrition, attempting to recover from sepsis. Overall prognosis remains very guarded.  stables and chest today. Hold diuresis. Resume diuretic therapy when blood pressures improved and azotemia resolves.   Signed, Sinclair Grooms, MD  03/13/2016, 10:02 AM

## 2016-03-13 NOTE — Progress Notes (Addendum)
PULMONARY / CRITICAL CARE MEDICINE   Name: Chad Rogers MRN: [id number] DOB: 03-08-1936    ADMISSION DATE:  02/22/2016 CONSULTATION DATE:  03/04/16  REFERRING MD:  03/04/16  CHIEF COMPLAINT:  Dr. Wyline Copas  Brief Summary:   80 y/o M, smoker, with PMH of ETOH abuse, allergic rhinitis, psoriasis, skin cancer, osteoarthritis, CAD, HTN, atrial fibrillation (not on anticoagulation, Hx falls), systolic CHF (EF 123456), HLD, pulmonary fibrosis and COPD (previously followed by Dr. Melvyn Novas - 2011) admitted to [facility] on 03/03/2016 complaints of SOB.    The patient recently had a prolonged hospitalization after a fall with several rib fractures.  During that admit, he had a prolonged intubation requiring tracheostomy and admission to an LTAC followed by rehab. The patient was apparently discharged one week prior to presentation.  He reported nausea, vomiting, weakness initially that resolved.  Subsequently, he developed worsening shortness of breath & DOE.  On admit, he was hypotensive, hypoxic and had an elevated lactic acid (7).  Work up concerning for new infiltrate & volume depletion with AKI.  The patient was admitted per [facility] for further care.  He responded well to IVF's and oxygen initially.  He was later found to have pulmonary edema with associated effusions and treated with Lasix.  Follow up CXR evaluation showed enlarging effusions.  Cardiology was consulted for evaluation.  During their evaluation, he was found to have pronounced bradycardia and pauses.  Atropine was administered and he was started on an external pacemaker.  HR was labile and he was started on dopamine.  He had subsequent respiratory distress with hypoxemia (sats dropped to 70's) with altered level of consciousness and required intubation.  He developed further metabolic derangements (123XX123, CO2 18, Hgb 9.7) 10/16 and was transferred to [facility] for further care.   SUBJECTIVE: Patient reports she did not sleep well last night.  Continues to have left chest wall pain. Reports dyspnea improving. Intermittent nonproductive cough. No other chest tightness or pressure. Patient did have some blood coated stool yesterday afternoon.  REVIEW OF SYSTEMS: No nausea or vomiting. Minimal epigastric pain. No melena or hematochezia per nurse and patient overnight. No headache or vision changes.  VITAL SIGNS: BP 110/74 (BP Location: Left Arm)   Pulse (!) 108   Temp 98.9 F (37.2 C) (Oral)   Resp (!) 26   Ht 5\' 10"  (1.778 m)   Wt 145 lb 4.5 oz (65.9 kg)   SpO2 96%   BMI 20.85 kg/m   HEMODYNAMICS:    VENTILATOR SETTINGS:    INTAKE / OUTPUT: I/O last 3 completed shifts: In: 2467.7 [P.O.:1680; I.V.:87.7; Other:450; IV Piggyback:250] Out: 700 [Urine:700]  PHYSICAL EXAMINATION: General:  Awake. No distress. No family at bedside.  Integument:  Warm & dry. No rash on exposed skin.  HEENT:  No scleral injection or icterus. Tracheostomy stoma with dressing over site. Cardiovascular:  Regular rate. No edema. No appreciable JVD.  Pulmonary:  Improving aeration bilaterally. Normal work of breathing on nasal cannula oxygen. Speaking in complete sentences. Abdomen: Soft. Normal bowel sounds. Nondistended.  Musculoskeletal:  No chest wall deformity. Tender to palpation over left lateral ribs. Neurological: Oriented to year, president, person, and place. Moving all 4 extremities equally. Grossly nonfocal.   LABS:  BMET  Recent Labs Lab 03/11/16 2205 03/12/16 0406 03/13/16 0525  NA 144 143 139  K 3.3* 4.3 3.9  CL 100* 99* 95*  CO2 33* 32 30  BUN 49* 52* 63*  CREATININE 0.95 1.07 1.29*  GLUCOSE  113* 102* 120*    Electrolytes  Recent Labs Lab 03/10/16 0500  03/11/16 2205 03/12/16 0406 03/13/16 0525  CALCIUM 9.1  < > 9.3 9.3 9.2  MG 2.1  --   --  2.3 2.0  PHOS 3.1  --   --  4.3 3.6  < > = values in this interval not displayed.  CBC  Recent Labs Lab 03/11/16 0354 03/12/16 0406 03/13/16 0525  WBC 9.1  11.0* 10.8*  HGB 9.4* 10.3* 9.1*  HCT 31.1* 34.8* 30.6*  PLT 170 201 190    Coag's No results for input(s): APTT, INR in the last 168 hours.  Sepsis Markers No results for input(s): LATICACIDVEN, PROCALCITON, O2SATVEN in the last 168 hours.  ABG No results for input(s): PHART, PCO2ART, PO2ART in the last 168 hours.  Liver Enzymes  Recent Labs Lab 03/10/16 0500 03/13/16 0525  AST 36  --   ALT 124*  --   ALKPHOS 90  --   BILITOT 1.1  --   ALBUMIN 2.7* 3.0*    Cardiac Enzymes No results for input(s): TROPONINI, PROBNP in the last 168 hours.  Glucose  Recent Labs Lab 03/12/16 0354 03/12/16 0811 03/12/16 1125 03/12/16 1546 03/12/16 2154 03/13/16 0754  GLUCAP 108* 110* 123* 157* 135* 119*    Imaging No results found.   STUDIES:  CXR 10/16: mild pulmonary edema with right pleural effusion and left basilar atx. CXR 10/23: New left basilar atelectasis.  The right pleural effusion is stable.  MICROBIOLOGY: Blood Ctx x2 10/11:  Negative Urine Ctx 10/11:  Negative Tracheal Asp Ctx 10/16:  Oral Flora  ANTIBIOTICS: Vancomycin 10/11 - 10/16 Cefepime 10/11 - 10/19  SIGNIFICANT EVENTS: 10/11  admitted to [facility] 10/16  transferred to [facility]. 10/17  ef 25%; baseline 10/20  Dobutamine being weaned off.  03-27-23  Cards deceased lasix. Renal function stable. Off dobutamine. Failed SBT with NSVT 10/22  Weaned 3 hours 10/23  Extubated   LINES/TUBES: OETT 10/16 - 10/23 R DL PICC 10/17 >> FOLEY >>  ASSESSMENT / PLAN:  PULMONARY A: Acute Hypoxic Respiratory Failure - Likely due to pulmonary edema. Extubated 10/23 w/ tenuous status. Possible HCAP - S/P tx. H/O Pulmonary Fibrosis/COPD followed by Dr. Melvyn Novas Tobacco Use Disorder  P:  Continuing Budesonide neb bid Continuing Duoneb TID Albuterol Nebs prn Holding Lasix for now Tobacco Cessation Education prior to discharge Incentive Spirometry & Flutter valve for pulmonary toilette Plan to resume home Breo prior to  discharge  CARDIOVASCULAR A:  Shock - Cardiogenic. Resolved. Acute on Chronic Systolic CHF - EF 123XX123 Elevated Troponin I - Likely demand ischemia. H/O Atrial Fibrillation/Flutter Sinus Bradycardia w/ Pauses - s/p atropine and external pacing followed by dobutamine, resolved. NSVT - during PSV wean, resolved  P:  Continuous telemetry monitoring Vitals per unit protocol Cardiology following & appreciate input Holding Lasix for now Amiodarone PO daily Metoprolol PO bid Negative balance as BP/renal function permit  Limit to [id number] fluids per day Checking EKG to evaluate QTc  RENAL A:  Acute Renal Failure - Stabilized. Hyponatremia - Resolved. Hypokalemia - Resolved. Hypomagnesemia - Resolved. Hypophosphatemia - Resolved.  P: Trending UOP Monitoring electrolytes & renal function daily Replacing electrolytes as indicated Holding diuresis for today  GASTROINTESTINAL A:  Transaminitis - Likely due to congestion from heart failure. Resolving. H/O EtOH Use  P: Continue Mechanical Soft Diet Protonix PO daily  HEMATOLOGIC A:  Anemia - Mild. No signs of active bleeding. Did have decrease I hemoglobin today.  P:  Trending  cell counts w/ CBC daily Repeat Hgb/Hct @ 1700 today with Type & Screen SCDs Heparin [place] q8hr  INFECTIOUS A:  Sepsis - Possible. HCAP - completed 8d of cefepime.   P:  Monitor for signs/symptoms of infection  ENDOCRINE A:  Hyperglycemia - No h/o DM. A1c 4.9.  P: Accu-checks qAC & HS SSI per Sensitive Algorithm  NEUROLOGIC A:  Acute Encephalopathy - Resolved. H/O EtOH Use  P: Thiamine & FA to PO daily Continuing home Remeron  Holding home Zoloft & Seroquel pending repeat QTc  MUSCULOSKELETAL A: Left Chest Wall Pain - Likely due to recent rib fractures after fall.  P: Checking Rib Films D/C Ibuprofen Tylenol PO Prn  FAMILY  - Updates: No family at bedside 10/25. Patient updated 10/25 by Dr.  Ashok Cordia.  TODAY'S SUMMARY:  80 y.o.malewith multiple co-morbidities, admitted by [facility] 03/02/2016 with SOB suspected due to HCAP vs pulmonary edema. On 10/16, developed worsening hypoxia along with profound bradycardia requiring atropine, TC pacing, dopamine. Required intubation due to respiratory distress and AMS. Later transferred to [facility] for further evaluation and management. continuing to improve with regards to degree of hypoxia and respiratory failure. Continuing pulmonary toilet with incentive spirometer. Patient's drop in hemoglobin is somewhat concerning although otherwise he is hemodynamically stable. Plan to repeat hemoglobin and hematocrit this afternoon. Checking daily to evaluate patient's safety with restarting home Seroquel. Given patient's continued clinical stability I am transferring him to a telemetry unit.   TRH to assume care & PCCM off as of 10/26.  Sonia Baller Ashok Cordia, M.D. [facility] Pulmonary & Critical Care Pager:  [phone] After 3pm or if no response, call [phone] 03/13/2016 10:16 AM

## 2016-03-14 ENCOUNTER — Other Ambulatory Visit: Payer: Self-pay

## 2016-03-14 DIAGNOSIS — A408 Other streptococcal sepsis: Secondary | ICD-10-CM

## 2016-03-14 LAB — CBC WITH DIFFERENTIAL/PLATELET
BASOS PCT: 0 %
Basophils Absolute: 0.1 10*3/uL (ref 0.0–0.1)
EOS ABS: 0 10*3/uL (ref 0.0–0.7)
EOS PCT: 0 %
HCT: 30.3 % — ABNORMAL LOW (ref 39.0–52.0)
Hemoglobin: 9.2 g/dL — ABNORMAL LOW (ref 13.0–17.0)
Lymphocytes Relative: 12 %
Lymphs Abs: 1.9 10*3/uL (ref 0.7–4.0)
MCH: 31 pg (ref 26.0–34.0)
MCHC: 30.4 g/dL (ref 30.0–36.0)
MCV: 102 fL — ABNORMAL HIGH (ref 78.0–100.0)
MONO ABS: 0.8 10*3/uL (ref 0.1–1.0)
MONOS PCT: 5 %
Neutro Abs: 13.9 10*3/uL — ABNORMAL HIGH (ref 1.7–7.7)
Neutrophils Relative %: 83 %
PLATELETS: 198 10*3/uL (ref 150–400)
RBC: 2.97 MIL/uL — ABNORMAL LOW (ref 4.22–5.81)
RDW: 18.3 % — AB (ref 11.5–15.5)
WBC: 16.7 10*3/uL — ABNORMAL HIGH (ref 4.0–10.5)

## 2016-03-14 LAB — GLUCOSE, CAPILLARY
GLUCOSE-CAPILLARY: 133 mg/dL — AB (ref 65–99)
Glucose-Capillary: 148 mg/dL — ABNORMAL HIGH (ref 65–99)
Glucose-Capillary: 115 mg/dL — ABNORMAL HIGH (ref 65–99)
Glucose-Capillary: 108 mg/dL — ABNORMAL HIGH (ref 65–99)

## 2016-03-14 LAB — RENAL FUNCTION PANEL
ALBUMIN: 3.1 g/dL — AB (ref 3.5–5.0)
Anion gap: 13 (ref 5–15)
BUN: 66 mg/dL — ABNORMAL HIGH (ref 6–20)
CALCIUM: 9.3 mg/dL (ref 8.9–10.3)
CO2: 28 mmol/L (ref 22–32)
CREATININE: 1.56 mg/dL — AB (ref 0.61–1.24)
Chloride: 92 mmol/L — ABNORMAL LOW (ref 101–111)
GFR calc non Af Amer: 41 mL/min — ABNORMAL LOW (ref >60)
GFR, EST AFRICAN AMERICAN: 47 mL/min — AB (ref >60)
GLUCOSE: 135 mg/dL — AB (ref 65–99)
Phosphorus: 4 mg/dL (ref 2.5–4.6)
Potassium: 4.2 mmol/L (ref 3.5–5.1)
SODIUM: 133 mmol/L — AB (ref 135–145)

## 2016-03-14 LAB — MAGNESIUM: MAGNESIUM: 2.1 mg/dL (ref 1.7–2.4)

## 2016-03-14 MED ORDER — AMIODARONE HCL 200 MG PO TABS
200.0000 mg | ORAL_TABLET | Freq: Two times a day (BID) | ORAL | Status: DC
  Administered 2016-03-14 – 2016-03-16 (×4): 200 mg via ORAL
  Filled 2016-03-14 (×4): qty 1

## 2016-03-14 MED ORDER — METOPROLOL TARTRATE 25 MG PO TABS
25.0000 mg | ORAL_TABLET | Freq: Two times a day (BID) | ORAL | Status: DC
  Administered 2016-03-14: 25 mg via ORAL
  Filled 2016-03-14 (×2): qty 1

## 2016-03-14 MED ORDER — IPRATROPIUM-ALBUTEROL 0.5-2.5 (3) MG/3ML IN SOLN
3.0000 mL | Freq: Three times a day (TID) | RESPIRATORY_TRACT | Status: DC
  Administered 2016-03-14 – 2016-03-16 (×6): 3 mL via RESPIRATORY_TRACT
  Filled 2016-03-14 (×8): qty 3

## 2016-03-14 MED ORDER — IPRATROPIUM-ALBUTEROL 0.5-2.5 (3) MG/3ML IN SOLN
3.0000 mL | Freq: Four times a day (QID) | RESPIRATORY_TRACT | Status: DC
  Administered 2016-03-14: 3 mL via RESPIRATORY_TRACT
  Filled 2016-03-14: qty 3

## 2016-03-14 MED ORDER — AMIODARONE HCL 200 MG PO TABS: 100.0000 mg | ORAL_TABLET | Freq: Every day | ORAL | Status: DC

## 2016-03-14 NOTE — Consult Note (Signed)
   [facility] CM Inpatient Consult   03/14/2016  [name] 02-Mar-1936 [id number]   Referral received for post hospital support and care management needs for HF and Resp. Failure.  Patient evaluated for community based chronic disease management services with [facility] as a benefit of patient's [employer]. Chart review reveals the patient is a 80 y/o M, smoker, with PMH of ETOH abuse, allergic rhinitis, psoriasis, skin cancer, osteoarthritis, CAD, HTN, atrial fibrillation (not on anticoagulation, Hx falls), systolic HF (EF 123456), HLD, pulmonary fibrosis and COPD admitted to [facility] on 02/26/2016 complaints of SOB. The patient recently had a prolonged hospitalization after a fall with several rib fractures.  During that admit, he had a prolonged intubation requiring tracheostomy and admission to an LTAC followed by rehab. The patient was apparently discharged one week prior to presentation.  He reported nausea, vomiting, weakness initially that resolved.  Subsequently, he developed worsening shortness of breath and on exertion.  On admit, he was hypotensive, hypoxic and had an elevated lactic acid (7).  Work up concerning for new infiltrate & volume depletion with AKI. Admitted with sepsis. Spoke with , HIPAA confirmed, and grandchildren in the room, [patient gave permission to speak with family present] at bedside to explain [facility] services and benefits. Consent form signed.   Patient will receive post hospital discharge call and will be evaluated for monthly home visits for assessments and disease process education.  Left contact information and [facility] literature at bedside. Made Inpatient Case Manager aware that [facility] following. Of note, [facility] Care Management services does not replace or interfere with any services that are arranged by inpatient case management or social work.  For additional questions or referrals please  contact:    Natividad Brood, RN BSN [facility] Liaison  [phone] business mobile phone Toll free office [phone]

## 2016-03-14 NOTE — Care Management Note (Signed)
Case Management Note Previous CM note initiated by Chad Barge, RN-02/29/2016, 2:00 PM   Patient Details  Name: Chad Rogers MRN: [id number] Date of Birth: 10/21/1935  Subjective/Objective:                  Pt admitted with sepsis. He is from home, lives with his wife and grandson. He uses a walker for mobility and is ind with ADL's. Pt has no oxygen PTA and is active with [facility] for nursing and PT services. Romualdo Bolk, of [facility], is aware of admission. Pt plans to return home with self care. Pt will need PT eval prior to DC.   Action/Plan: Will cont to follow.   Expected Discharge Date:                 Expected Discharge Plan:  [place] ([facility] SNF)  In-House Referral:  NA  Discharge planning Services  CM Consult  Post Acute Care Choice:  Home Health, Resumption of Svcs/PTA Provider Choice offered to:  Patient   DME Arranged:    DME Agency:     HH Arranged:  RN, PT, OT [place] Agency:  [facility]  Status of Service:  Completed, signed off  If discussed at [place] of Stay Meetings, dates discussed:    Discharge Disposition: home with home health   Additional Comments:  03/14/16- 0940- Chad Gibbons RN BSN- orders for [facility] have been placed PT/OT/aide- have notified Santiago Glad with [facility] for resumption of [place] services.   Chad Client Needles, RN 03/14/2016, 9:38 AM [phone]

## 2016-03-14 NOTE — Care Management Important Message (Signed)
Important Message  Patient Details  Name: Chad Rogers MRN: [id number] Date of Birth: 11/19/1935   Medicare Important Message Given:  Yes    [name] Abena 03/14/2016, 9:20 AM

## 2016-03-14 NOTE — Progress Notes (Signed)
Patient Name: Chad Rogers Date of Encounter: 03/14/2016  The patient has been seen in conjunction with Vin Bhagat, PAC. All aspects of care have been considered and discussed. The patient has been personally interviewed, examined, and all clinical data has been reviewed.   With worsening kidney function, we should continue to hold diuretics. Resume diuretic regimen once kidney function is stable.  Digoxin was considered yesterday but never given. Plan to increase amiodarone to 200 mg twice a day for 5 days then back to 100 mg daily.  Overall prognosis is poor. Consider discussion of goals of care.    Primary Cardiologist: Dr. Ladonna Snide Problem List     Principal Problem:   Sepsis [facility]) Active Problems:   Hypertension   Pulmonary fibrosis ([place])   Tobacco abuse   Traumatic fracture of ribs with pneumothorax   Multiple rib fractures   Acute on chronic systolic congestive heart failure (HCC)   Atrial fibrillation, persistent ([place])   Debility   HCAP (healthcare-associated pneumonia)   Protein-calorie malnutrition, severe   Metabolic acidosis   CHF (congestive heart failure) (HCC)   Endotracheally intubated   Acute encephalopathy   AKI (acute kidney injury) ([place])   Hyperkalemia   Pressure injury of skin   Acute hypoxemic respiratory failure (HCC)   Acute pulmonary edema (HCC)   Acute combined systolic and diastolic heart failure (HCC)   Cardiogenic shock (HCC)   Acute renal failure with acute tubular necrosis superimposed on stage 2 chronic kidney disease (HCC)     Subjective    Complains of dyspnea this morning, now on supplement oxygen. No chest pain.   Inpatient Medications    Scheduled Meds: . amiodarone  100 mg Oral Daily  . budesonide (PULMICORT) nebulizer solution  0.5 mg Nebulization BID  . feeding supplement (ENSURE ENLIVE)  237 mL Oral QID  . folic acid  1 mg Oral Daily  . heparin  5,000 Units Subcutaneous Q8H  . hydrocortisone cream    Topical BID  . insulin aspart  0-5 Units Subcutaneous QHS  . insulin aspart  0-9 Units Subcutaneous TID WC  . ipratropium-albuterol  3 mL Nebulization QID  . mouth rinse  15 mL Mouth Rinse BID  . metoprolol tartrate  25 mg Oral BID  . mirtazapine  15 mg Oral QHS  . multivitamin with minerals  1 tablet Oral Daily  . pantoprazole  40 mg Oral Daily  . sodium chloride flush  10-40 mL Intracatheter Q12H  . thiamine  100 mg Oral Daily   Continuous Infusions: . sodium chloride Stopped (03/12/16 1100)   PRN Meds: acetaminophen, albuterol, sodium chloride flush   Vital Signs    Vitals:   03/13/16 2053 03/13/16 2119 03/14/16 0500 03/14/16 0841  BP: 108/63  96/60   Pulse: 74  100   Resp: (!) 22  20   Temp: 98.2 F (36.8 C)  98.2 F (36.8 C)   TempSrc: Oral  Axillary   SpO2: (!) 84% 98% 98% 97%  Weight:   147 lb 3.2 oz (66.8 kg)   Height:        Intake/Output Summary (Last 24 hours) at 03/14/16 0931 Last data filed at 03/13/16 1800  Gross per 24 hour  Intake              240 ml  Output                0 ml  Net  240 ml   Filed Weights   03/12/16 0452 03/13/16 0500 03/14/16 0500  Weight: 141 lb 1.5 oz (64 kg) 145 lb 4.5 oz (65.9 kg) 147 lb 3.2 oz (66.8 kg)    Physical Exam   GEN: Chronically ill appearing, thin, frail male on supplement oxygen HEENT: Grossly normal.  Neck: Supple, no JVD, carotid bruits, or masses. Cardiac: Irregular , no murmurs, rubs, or gallops. No clubbing, cyanosis, edema.  Radials/DP/PT 2+ and equal bilaterally.  Respiratory:  Respirations regular and unlabored, clear to auscultation bilaterally. GI: Soft, nontender, nondistended, BS + x 4. MS: no deformity or atrophy. Skin:diffuse ecchymosis on arms Neuro:  Strength and sensation are intact. Psych: AAOx3.  Normal affect.  Labs    CBC  Recent Labs  03/13/16 0525 03/13/16 1704 03/14/16 0219  WBC 10.8*  --  16.7*  NEUTROABS 7.5  --  13.9*  HGB 9.1* 9.7* 9.2*  HCT 30.6* 32.1*  30.3*  MCV 104.1*  --  102.0*  PLT 190  --  99991111   Basic Metabolic Panel  Recent Labs  03/13/16 0525 03/14/16 0219  NA 139 133*  K 3.9 4.2  CL 95* 92*  CO2 30 28  GLUCOSE 120* 135*  BUN 63* 66*  CREATININE 1.29* 1.56*  CALCIUM 9.2 9.3  MG 2.0 2.1  PHOS 3.6 4.0   Liver Function Tests  Recent Labs  03/13/16 0525 03/14/16 0219  ALBUMIN 3.0* 3.1*   No results for input(s): LIPASE, AMYLASE in the last 72 hours. Cardiac Enzymes No results for input(s): CKTOTAL, CKMB, CKMBINDEX, TROPONINI in the last 72 hours. BNP Invalid input(s): POCBNP D-Dimer No results for input(s): DDIMER in the last 72 hours. Hemoglobin A1C  Recent Labs  03/12/16 1130  HGBA1C 4.9   Fasting Lipid Panel No results for input(s): CHOL, HDL, LDLCALC, TRIG, CHOLHDL, LDLDIRECT in the last 72 hours. Thyroid Function Tests No results for input(s): TSH, T4TOTAL, T3FREE, THYROIDAB in the last 72 hours.  Invalid input(s): FREET3  Telemetry    Afib at rate of 90s-100s - Personally Reviewed  ECG    N/A  Radiology    Dg Ribs Bilateral  Result Date: 03/13/2016 CLINICAL DATA:  Left-sided chest pain with history recent fractured ribs EXAM: BILATERAL RIBS - 3+ VIEW COMPARISON:  03/11/2016 FINDINGS: Cardiac shadow remains enlarged. Right-sided central venous line is again seen in satisfactory position. Rib fractures are again noted on the left to include the fourth, fifth, sixth and seventh and eighth ribs. Some callus formation is noted at the eighth rib fracture. Additionally a healing ninth rib fracture is noted as well. No pneumothorax is seen. Healing rib fractures on the right are noted of the eighth through tenth ribs. Small right pleural effusion is noted. No pneumothorax is seen. IMPRESSION: Bilateral rib fractures with some degree of healing. No pneumothorax is noted. A small right-sided pleural effusion is seen. Electronically Signed   By: Inez Catalina M.D.   On: 03/13/2016 21:24    Cardiac  Studies   No new studies  Patient Profile     80 year old gentleman presenting with acute on chronic respiratory failure, history of chronic atrial fibrillation with rapid ventricular response, on chronic low-dose amiodarone(? for rate control) subsequent bradycardia/ high-grade AV block, history of chronic systolic heart failure with EF of 25%, intubation after aggressive IV fluids in the setting of sepsis led to CHF.  Assessment & Plan    1. Atrial fibrillation/atrial flutter: Rate is fairly controlled on amiodarone and metoprolol. Plan was to  add low-dose digoxin (2 doses yesterday)--> however never started. Will discuss with MD  2. Acute on chronic combined systolic and diastolic heart failure: Net I & O negative 4.5 L. Total weight loss of 3lb (150-->147lb).  With hypotension and progressive azotemia, Lasix held and  liberalized fluid restriction. Overall looks euvolemic. Seem need low dose lasix with dyspnea this morning. Will review with MD.   4. Respiratory failure: multifactorial related to heart failure and chronic pulmonaryfibrosis.Marland KitchenExtubated and comfortable. Had Dyspnea today, now on supplemental oxygen. Per PCCM.   5. Acute on chronic kidney: Scr increased to 1.56 from 1.29 with holding diuretics.   6. Hypokalemia: resolved.    Signed, Leanor Kail, PA  03/14/2016, 9:31 AM

## 2016-03-14 NOTE — Progress Notes (Signed)
[facility] progress note   Name: Chad Rogers MRN:   [id number] DOB:   [birth year]           ADMISSION DATE:  02/19/2016 CONSULTATION DATE:  03/04/16     [facility] assumed care  03/14/16   Brief Summary:   80 y/o M, smoker, with PMH of ETOH abuse, allergic rhinitis, psoriasis, skin cancer, osteoarthritis, CAD, HTN, atrial fibrillation (not on anticoagulation, Hx falls), systolic CHF (EF 123456), HLD, pulmonary fibrosis and COPD (previously followed by Dr. Melvyn Novas - 2011) admitted to [facility] on 02/21/2016 complaints of SOB.    The patient recently had a prolonged hospitalization after a fall with several rib fractures.  During that admit, he had a prolonged intubation requiring tracheostomy and admission to an LTAC followed by rehab. The patient was apparently discharged one week prior to presentation.  He reported nausea, vomiting, weakness initially that resolved.  Subsequently, he developed worsening shortness of breath & DOE.  On admit, he was hypotensive, hypoxic and had an elevated lactic acid (7).  Work up concerning for new infiltrate & volume depletion with AKI.  The patient was admitted per [facility] for further care.  He responded well to IVF's and oxygen initially.  He was later found to have pulmonary edema with associated effusions and treated with Lasix.  Follow up CXR evaluation showed enlarging effusions.  Cardiology was consulted for evaluation.  During their evaluation, he was found to have pronounced bradycardia and pauses.  Atropine was administered and he was started on an external pacemaker.  HR was labile and he was started on dopamine.  He had subsequent respiratory distress with hypoxemia (sats dropped to 70's) with altered level of consciousness and required intubation.  He developed further metabolic derangements (123XX123, CO2 18, Hgb 9.7) 10/16 and was transferred to [facility] for further care.        VITAL SIGNS: BP 96/60 (BP Location: Left Arm)   Pulse 100   Temp 98.2 F (36.8  C) (Axillary)   Resp 20   Ht 5\' 10"  (1.778 m)   Wt 66.8 kg (147 lb 3.2 oz)   SpO2 98%   BMI 21.12 kg/m   HEMODYNAMICS:    VENTILATOR SETTINGS: FiO2 (%):  [44 %] 44 %  INTAKE / OUTPUT: I/O last 3 completed shifts: In: 23 [P.O.:960] Out: 350 [Urine:350]  PHYSICAL EXAMINATION: General:  Awake. No distress. No family at bedside.  Integument:  Warm & dry. No rash on exposed skin.  HEENT:  No scleral injection or icterus. Tracheostomy stoma with dressing over site. Cardiovascular:  Regular rate. No edema. No appreciable JVD.  Pulmonary:  Improving aeration bilaterally. Normal work of breathing on nasal cannula oxygen. Speaking in complete sentences. Abdomen: Soft. Normal bowel sounds. Nondistended.  Musculoskeletal:  No chest wall deformity. Tender to palpation over left lateral ribs. Neurological: Oriented to year, president, person, and place. Moving all 4 extremities equally. Grossly nonfocal.   LABS:  BMET  Recent Labs Lab 03/12/16 0406 03/13/16 0525 03/14/16 0219  NA 143 139 133*  K 4.3 3.9 4.2  CL 99* 95* 92*  CO2 32 30 28  BUN 52* 63* 66*  CREATININE 1.07 1.29* 1.56*  GLUCOSE 102* 120* 135*    Electrolytes  Recent Labs Lab 03/12/16 0406 03/13/16 0525 03/14/16 0219  CALCIUM 9.3 9.2 9.3  MG 2.3 2.0 2.1  PHOS 4.3 3.6 4.0    CBC  Recent Labs Lab 03/12/16 0406 03/13/16 0525 03/13/16 1704 03/14/16 0219  WBC 11.0* 10.8*  --  16.7*  HGB 10.3* 9.1* 9.7* 9.2*  HCT 34.8* 30.6* 32.1* 30.3*  PLT 201 190  --  198    Coag's No results for input(s): APTT, INR in the last 168 hours.  Sepsis Markers No results for input(s): LATICACIDVEN, PROCALCITON, O2SATVEN in the last 168 hours.  ABG No results for input(s): PHART, PCO2ART, PO2ART in the last 168 hours.  Liver Enzymes  Recent Labs Lab 03/10/16 0500 03/13/16 0525 03/14/16 0219  AST 36  --   --   ALT 124*  --   --   ALKPHOS 90  --   --   BILITOT 1.1  --   --   ALBUMIN 2.7* 3.0* 3.1*     Cardiac Enzymes No results for input(s): TROPONINI, PROBNP in the last 168 hours.  Glucose  Recent Labs Lab 03/12/16 2154 03/13/16 0754 03/13/16 1148 03/13/16 1641 03/13/16 2151 03/14/16 0629  GLUCAP 135* 119* 185* 127* 106* 133*    Imaging Dg Ribs Bilateral  Result Date: 03/13/2016 CLINICAL DATA:  Left-sided chest pain with history recent fractured ribs EXAM: BILATERAL RIBS - 3+ VIEW COMPARISON:  03/11/2016 FINDINGS: Cardiac shadow remains enlarged. Right-sided central venous line is again seen in satisfactory position. Rib fractures are again noted on the left to include the fourth, fifth, sixth and seventh and eighth ribs. Some callus formation is noted at the eighth rib fracture. Additionally a healing ninth rib fracture is noted as well. No pneumothorax is seen. Healing rib fractures on the right are noted of the eighth through tenth ribs. Small right pleural effusion is noted. No pneumothorax is seen. IMPRESSION: Bilateral rib fractures with some degree of healing. No pneumothorax is noted. A small right-sided pleural effusion is seen. Electronically Signed   By: Inez Catalina M.D.   On: 03/13/2016 21:24     STUDIES:  CXR 10/16: mild pulmonary edema with right pleural effusion and left basilar atx. CXR 10/23: New left basilar atelectasis.  The right pleural effusion is stable.  MICROBIOLOGY: Blood Ctx x2 10/11:  Negative Urine Ctx 10/11:  Negative Tracheal Asp Ctx 10/16:  Oral [place]  ANTIBIOTICS: Vancomycin 10/11 - 10/16 Cefepime 10/11 - 10/19  SIGNIFICANT EVENTS: 10/11  admitted to [facility]. 10/16  transferred to [facility]. 10/17  ef 25%; baseline 10/20  Dobutamine being weaned off.  Apr 01, 2023  Cards deceased lasix. Renal function stable. Off dobutamine. Failed SBT with NSVT 10/22  Weaned 3 hours 10/23  Extubated   LINES/TUBES: OETT 10/16 - 10/23 R DL PICC 10/17 >> FOLEY >>   Subjective-complaining of dyspnea at rest  ASSESSMENT / PLAN:  Acute Hypoxic Respiratory  Failure - multifactorial Likely due to pulmonary edema. Extubated 10/23 w/ tenuous status. Possible HCAP - S/P completion of antibiotics H/O Pulmonary Fibrosis/COPD followed by Dr. Melvyn Novas Tobacco Use Disorder Continuing Budesonide neb bid Continuing Duoneb TID Albuterol Nebs prn Holding Lasix for now, may need to resume given dyspnea this morning Tobacco Cessation Education prior to discharge Incentive Spirometry & Flutter valve for pulmonary toilette Plan to resume home Breo prior to discharge    Shock - Cardiogenic. Resolved. Acute on Chronic Systolic CHF - EF 123XX123 Elevated Troponin I - Likely demand ischemia. H/O Atrial Fibrillation/Flutter Sinus Bradycardia w/ Pauses - s/p atropine and external pacing followed by dobutamine, resolved. NSVT - during PSV wean, resolved  overall 6 L negative since admission Continuous telemetry monitoring Vitals per unit protocol Cardiology following & appreciate input Holding Lasix for now Amiodarone PO daily Metoprolol PO bid, cardiology added low-dose digoxin  Negative balance as BP/renal function permit  Limit to [id number] fluids per day Checking EKG to evaluate QTc    Acute Renal Failure -  Scr increased to 1.56 from 1.29 with holding diuretics Hyponatremia - Resolved. Hypokalemia - Resolved. Hypomagnesemia - Resolved. Hypophosphatemia - Resolved.  Trending UOP Monitoring electrolytes & renal function daily Replacing electrolytes as indicated Holding diuresis for today    Transaminitis - Likely due to congestion from heart failure. Resolving. H/O EtOH Use Continue Mechanical Soft Diet Protonix PO daily    Anemia - Mild. No signs of active bleeding. Did have decrease I hemoglobin today.  Trending cell counts w/ CBC daily Repeat Hgb/Hct @ 1700 today with Type & Screen SCDs Heparin [place] q8hr    Sepsis - Possible. HCAP - completed 8d of cefepime.  Monitor for signs/symptoms of infection     Hyperglycemia - No h/o DM.  A1c 4.9. Accu-checks qAC & HS SSI per Sensitive Algorithm    Acute Encephalopathy - Resolved. H/O EtOH Use Thiamine & FA to PO daily Continuing home Remeron  Holding home Zoloft & Seroquel pending repeat QTc    Left Chest Wall Pain - Likely due to recent rib fractures after fall. X-ray showed bilateral rib fractures no pneumothorax, D/C Ibuprofen Tylenol PO Prn

## 2016-03-15 ENCOUNTER — Inpatient Hospital Stay (HOSPITAL_COMMUNITY): Payer: Medicare Other

## 2016-03-15 ENCOUNTER — Other Ambulatory Visit: Payer: Self-pay | Admitting: *Deleted

## 2016-03-15 DIAGNOSIS — I509 Heart failure, unspecified: Secondary | ICD-10-CM

## 2016-03-15 DIAGNOSIS — Z515 Encounter for palliative care: Secondary | ICD-10-CM

## 2016-03-15 LAB — CBC WITH DIFFERENTIAL/PLATELET
BASOS ABS: 0.1 10*3/uL (ref 0.0–0.1)
Basophils Relative: 0 %
EOS ABS: 0.1 10*3/uL (ref 0.0–0.7)
EOS PCT: 0 %
HCT: 28.2 % — ABNORMAL LOW (ref 39.0–52.0)
Hemoglobin: 8.7 g/dL — ABNORMAL LOW (ref 13.0–17.0)
LYMPHS ABS: 2 10*3/uL (ref 0.7–4.0)
Lymphocytes Relative: 18 %
MCH: 31.3 pg (ref 26.0–34.0)
MCHC: 30.9 g/dL (ref 30.0–36.0)
MCV: 101.4 fL — ABNORMAL HIGH (ref 78.0–100.0)
MONO ABS: 0.7 10*3/uL (ref 0.1–1.0)
Monocytes Relative: 6 %
Neutro Abs: 8.4 10*3/uL — ABNORMAL HIGH (ref 1.7–7.7)
Neutrophils Relative %: 76 %
PLATELETS: 215 10*3/uL (ref 150–400)
RBC: 2.78 MIL/uL — AB (ref 4.22–5.81)
RDW: 18.6 % — AB (ref 11.5–15.5)
WBC: 11.1 10*3/uL — AB (ref 4.0–10.5)

## 2016-03-15 LAB — GLUCOSE, CAPILLARY
GLUCOSE-CAPILLARY: 120 mg/dL — AB (ref 65–99)
GLUCOSE-CAPILLARY: 107 mg/dL — AB (ref 65–99)
Glucose-Capillary: 142 mg/dL — ABNORMAL HIGH (ref 65–99)
Glucose-Capillary: 127 mg/dL — ABNORMAL HIGH (ref 65–99)

## 2016-03-15 LAB — COMPREHENSIVE METABOLIC PANEL
ALT: 59 U/L (ref 17–63)
ANION GAP: 14 (ref 5–15)
AST: 26 U/L (ref 15–41)
Albumin: 2.9 g/dL — ABNORMAL LOW (ref 3.5–5.0)
Alkaline Phosphatase: 68 U/L (ref 38–126)
BILIRUBIN TOTAL: 1.8 mg/dL — AB (ref 0.3–1.2)
BUN: 64 mg/dL — AB (ref 6–20)
CHLORIDE: 90 mmol/L — AB (ref 101–111)
CO2: 28 mmol/L (ref 22–32)
Calcium: 9.2 mg/dL (ref 8.9–10.3)
Creatinine, Ser: 1.86 mg/dL — ABNORMAL HIGH (ref 0.61–1.24)
GFR, EST AFRICAN AMERICAN: 38 mL/min — AB (ref >60)
GFR, EST NON AFRICAN AMERICAN: 33 mL/min — AB (ref >60)
Glucose, Bld: 126 mg/dL — ABNORMAL HIGH (ref 65–99)
POTASSIUM: 4.5 mmol/L (ref 3.5–5.1)
Sodium: 132 mmol/L — ABNORMAL LOW (ref 135–145)
TOTAL PROTEIN: 5.9 g/dL — AB (ref 6.5–8.1)

## 2016-03-15 LAB — MAGNESIUM: MAGNESIUM: 2.3 mg/dL (ref 1.7–2.4)

## 2016-03-15 MED ORDER — DIPHENHYDRAMINE HCL 25 MG PO CAPS
25.0000 mg | ORAL_CAPSULE | Freq: Three times a day (TID) | ORAL | Status: DC | PRN
Start: 2016-03-15 — End: 2016-03-16
  Administered 2016-03-15: 25 mg via ORAL
  Filled 2016-03-15: qty 1

## 2016-03-15 MED ORDER — METHYLPREDNISOLONE SODIUM SUCC 125 MG IJ SOLR
125.0000 mg | Freq: Once | INTRAMUSCULAR | Status: AC
  Administered 2016-03-15: 125 mg via INTRAVENOUS

## 2016-03-15 MED ORDER — MORPHINE SULFATE (PF) 2 MG/ML IV SOLN
INTRAVENOUS | Status: AC
  Filled 2016-03-15: qty 1

## 2016-03-15 MED ORDER — MORPHINE SULFATE (PF) 2 MG/ML IV SOLN
2.0000 mg | Freq: Once | INTRAVENOUS | Status: AC
  Administered 2016-03-15: 2 mg via INTRAVENOUS

## 2016-03-15 MED ORDER — METHYLPREDNISOLONE SODIUM SUCC 125 MG IJ SOLR
INTRAMUSCULAR | Status: AC
  Filled 2016-03-15: qty 2

## 2016-03-15 MED ORDER — METOPROLOL TARTRATE 25 MG PO TABS
25.0000 mg | ORAL_TABLET | Freq: Two times a day (BID) | ORAL | Status: DC
  Administered 2016-03-16: 25 mg via ORAL
  Filled 2016-03-15 (×2): qty 1

## 2016-03-15 MED ORDER — FUROSEMIDE 10 MG/ML IJ SOLN
40.0000 mg | Freq: Once | INTRAMUSCULAR | Status: AC
Start: 2016-03-15 — End: 2016-03-15
  Administered 2016-03-15: 40 mg via INTRAVENOUS
  Filled 2016-03-15: qty 4

## 2016-03-15 MED ORDER — DIPHENHYDRAMINE HCL 25 MG PO CAPS
25.0000 mg | ORAL_CAPSULE | Freq: Once | ORAL | Status: AC
  Administered 2016-03-15: 25 mg via ORAL
  Filled 2016-03-15: qty 1

## 2016-03-15 MED ORDER — MORPHINE SULFATE 15 MG PO TABS
15.0000 mg | ORAL_TABLET | ORAL | Status: DC | PRN
  Administered 2016-03-15: 15 mg via ORAL
  Filled 2016-03-15: qty 1

## 2016-03-15 MED ORDER — MORPHINE SULFATE (PF) 2 MG/ML IV SOLN
2.0000 mg | Freq: Once | INTRAVENOUS | Status: AC
  Administered 2016-03-15: 2 mg via INTRAVENOUS
  Filled 2016-03-15: qty 1

## 2016-03-15 MED ORDER — ALTEPLASE 2 MG IJ SOLR
2.0000 mg | Freq: Once | INTRAMUSCULAR | Status: AC
  Administered 2016-03-15: 2 mg

## 2016-03-15 NOTE — Significant Event (Addendum)
Rapid Response Event Note  Overview:  Called to see patient with orders for Bipap and SDU order transfer Time Called: 1800 Arrival Time: 1807 Event Type: Respiratory  Initial Focused Assessment:  On arrival patient supine in bed - pale - cool and dry - alert - weak but follows commands - able to speak full sentences - resps shallow and rapid but no acute distress - bil BS present - some fine crackles noted - patient has pulmonary fibrosis - abd soft - patient c/o only of rib pain bilaterally - he states he feels some better - but not at baseline - he states he mostly feels very SOB when getting up to void - some JVD noted.  Romona Curls NP with Palliative present -working with patient and family.  Monitor shows afib  - BP 88/62 HR 107 RR 28 O2 sats 100% on nasal cannula.  Lasix and Morphine were given prior to arrival.    Interventions:  ABG - PCXR per order Dr. Allyson Sabal.  Oral care done.  Solumedrol given per Kristin Bruins Rn per order Dr. Allyson Sabal.  Romona Curls NP working with family and patient regarding [place].  Patient without acute distress - ABG results reviewed - will hold on Bipap for now - patient without acute distress - remains able to speak full sentences.  RN Kristin Bruins called report to RN on 4E - patients other family members now arriving - will given patient and family time with palliative NP - patient to move after.  Handoff to [place] and [employer] - to call if patient changes.  Will follow on SDU as needed.    Plan of Care (if not transferred):  Event Summary: Name of Physician Notified: Dr. Allyson Sabal at  (pta RRT - orders in place)  Name of Consulting Physician Notified: [place] at  (pta RRT)  Outcome: Transferred (Comment)  Event End Time: [id number]  Quin Hoop

## 2016-03-15 NOTE — Consult Note (Signed)
Consultation Note Date: 03/15/2016   Patient Name: Chad Rogers  DOB: Jul 12, 1935  MRN: [id number]  Age / Sex: 80 y.o., male  PCP: Celene Squibb, MD Referring Physician: Reyne Dumas, MD  Reason for Consultation: Establishing goals of care  HPI/Patient Profile: 80 y.o. male  with past medical history of Osteoarthritis, coronary artery disease, hypertension, atrial fibrillation (not on anticoagulation, history of falls), systolic congestive heart failure with an ejection fraction of 25%, hyperlipidemia, pulmonary fibrosis, COPD, (previously followed by Dr. Shyrl Numbers beginning in 2011), alcohol use admitted on 03/03/2016 with complaints of shortness of breath to [facility] on 03/12/2016. Patient recently has had a prolonged hospitalization after a fall with several rib fractures which required prolonged intubation and ultimately a tracheostomy. He was then admitted to an [facility] for rehabilitation. He was discharged home one week prior to presentation to [facility] on 02/26/2016. In ED, he was hypotensive, hypoxic and had an elevated lactic acid level of 7. Workup was concerning for new infiltrate and volume depletion with acute kidney injury. He also was found to have profound bradycardia  and pauses. Atropine was administered and he was started on external pacemaker. Heart rate was labile and he was started on dopamine. He developed respiratory distress with hypoxemia, sats dropped into the 70s, with altered level of consciousness which required intubation. His potassium was 6.3, and on 03/04/2016 he was transferred to [facility] for further care. He has now been transferred to the floor  Clinical Assessment and Goals of Care: Upon meeting Chad Rogers he is quite tachypneic,. He Chad barely answer 1-2 words without pausing for breath. His respiratory rate is between 36 and 40. I did order morphine IV 2 mg  1. He had verbalized that this it helped him in the earlier in the day. He was tight and wheezy and also ordered a nebulizer treatment. This did help him subjectively report that he was not short of breath however he remained tachypneic with a rate of 30-32. I notified Dr. Allyson Sabal and was given orders to give patient 40 mg of Lasix 1. Vital signs were conveyed to her. At that time after  receiving morphine he was satting 100% on 2 L, blood pressure was 88/62, heart rate 107 and he was in atrial fib. At that time his daughter Chad Rogers arrived to the unit. Chad Rogers had shared that Chad Rogers was the person who helped him make decisions when he had been ill in the past. Chad Rogers is married with 3 daughters and has not designated Immunologist or other family members as healthcare proxy. Per Chad Rogers patient's wife has been overwhelmed by the acuity of his illness and has been unable to make decisions or even carry on a conversation with her husband about his wishes. I spent a great deal of time not only updating Chad Rogers as to her father's clinical condition and my concerns. I spent time talking to Chad Rogers alone as well as with Chad Rogers and Chad Rogers present regarding guidance going forward whether he  would want to have another trach, CPR, defibrillation and what that could mean for him going forward such as no  oral intake, feeding tube, perhaps living in a facility. When he had had a trach in the past he found this very challenging as well as going to an LTAC. He states one the hardest things he had been through was not being able to eat or drink and he would not want to live the rest of his days in a facility. His other daughter arrived from [place] at this time  Chad Rogers primary decision maker if he is unable to speak for himself would be his spouse, Chad Rogers. He has 3 daughters, 2 of which are on the unit tonight. They appear to be making decisions as a family.    SUMMARY OF RECOMMENDATIONS   Full  code for now. Family to discuss DO NOT RESUSCITATE status versus full code. These terms were explained to patient and family Family to discuss whether he would want to have another trach Patient to be transferred to stepdown unit  Did inform family if they reach a decision during the night, to change CODE STATUS to DO NOT RESUSCITATE, they could notify the nurse and she/he could relate this to the physician Patient did agree to try BiPAP tonight should be indicated Code Status/Advance Care Planning:  Full code    Symptom Management:   Dyspnea: Patient clearly is at end-stage lung disease and would benefit from opioids. For now given his full CODE STATUS and fragile condition will leave this dosing to critical care medicine for now. He has Rogers well on morphine IV and would recommend 2-4 mg every 2 hours when necessary should he reaches a DO NOT RESUSCITATE decision. Continue targeted pulmonary treatments with nebulizer treatments steroids if appropriate as well as oxygen  Palliative Prophylaxis:   Aspiration, Bowel Regimen, Delirium Protocol, Frequent Pain Assessment, Oral Care and Turn Reposition  Additional Recommendations (Limitations, Scope, Preferences):  Full Scope Treatment  Psycho-social/Spiritual:   Desire for further Chaplaincy support:no  Additional Recommendations: Grief/Bereavement Support  Prognosis:  < 2 weeks in the setting of end-stage pulmonary fibrosis, COPD , systolic congestive heart failure with an ejection fraction of 25%, worsening creatinine now 1.86, atrial fibrillation,  protein calorie malnutrition as reflected by an albumin of 2.9   Discharge Planning: To Be Determined      Primary Diagnoses: Present on Admission: . Sepsis ([place]) . HCAP (healthcare-associated pneumonia) . Multiple rib fractures . Hypertension . Debility . Acute on chronic systolic congestive heart failure ([place]) . Atrial fibrillation, persistent ([place]) . Pulmonary fibrosis  ([place]) . Tobacco abuse . Traumatic fracture of ribs with pneumothorax   I have reviewed the medical record, interviewed the patient and family, and examined the patient. The following aspects are pertinent.  Past Medical History:  Diagnosis Date  . Alcohol abuse    family reports that [pt will drink at least 6 beers or more a day,   . Allergic rhinitis   . Atrial fibrillation ([place])    Not on anticoagulation  . Cardiomyopathy [id number]   Acute systolic CHF at presentation; 05/2009 cardiac cath- 06/01/09  non obst coronary artery disease with EF 25%  . CHF (congestive heart failure) ([place])   . COPD (chronic obstructive pulmonary disease) ([place])   . Coronary artery disease   . Hypertension   . Osteoarthritis   . Psoriasis   . Pulmonary fibrosis ([place])    12/2004 with basilar fibrotic changes;  PFT's 06/07/09 FEV1  1.98 (68%) raio 53 no better after B2,  DLC0105%  . Pulmonary fibrosis ([place])   . Skin cancer   . Subdural hygroma 03/2005   bilateral   . Tobacco abuse    100 pack years; quit   Social History   Social History  . Marital status: Married    Spouse name: N/A  . Number of children: N/A  . Years of education: N/A   Occupational History  . Driver     [place] History Main Topics  . Smoking status: Current Every Day Smoker    Packs/day: 2.00    Years: 50.00    Types: Cigarettes  . Smokeless tobacco: Former Systems developer  . Alcohol use 17.5 oz/week    35 Standard drinks or equivalent per week     Comment: at least 6 beers or more a day,  last etoh was august 2017  . Drug use: No  . Sexual activity: Not Asked   Other Topics Concern  . None   Social History Narrative  . None   Family History  Problem Relation Age of Onset  . Cancer Mother 45    gynecologic  . Aneurysm Father 7    cerebral aneurysm  . Asthma Maternal Grandmother    Scheduled Meds: . [START ON 03/20/2016] amiodarone  100 mg Oral Daily  . amiodarone  200 mg Oral BID  . budesonide  (PULMICORT) nebulizer solution  0.5 mg Nebulization BID  . feeding supplement (ENSURE ENLIVE)  237 mL Oral QID  . folic acid  1 mg Oral Daily  . furosemide  40 mg Intravenous Once  . heparin  5,000 Units Subcutaneous Q8H  . hydrocortisone cream   Topical BID  . insulin aspart  0-5 Units Subcutaneous QHS  . insulin aspart  0-9 Units Subcutaneous TID WC  . ipratropium-albuterol  3 mL Nebulization TID  . mouth rinse  15 mL Mouth Rinse BID  . metoprolol tartrate  25 mg Oral BID  . mirtazapine  15 mg Oral QHS  . morphine      . multivitamin with minerals  1 tablet Oral Daily  . pantoprazole  40 mg Oral Daily  . sodium chloride flush  10-40 mL Intracatheter Q12H  . thiamine  100 mg Oral Daily   Continuous Infusions: . sodium chloride Stopped (03/12/16 1100)   PRN Meds:.acetaminophen, albuterol, diphenhydrAMINE, sodium chloride flush Medications Prior to Admission:  Prior to Admission medications   Medication Sig Start Date End Date Taking? Authorizing Provider  acetaminophen (TYLENOL) 325 MG tablet Take 1-2 tablets (325-650 mg total) by mouth every 6 (six) hours as needed for mild pain. Max 2,000 mg /daily 02/21/16  Yes Ivan Anchors Love, PA-C  amiodarone (PACERONE) 100 MG tablet Take 1 tablet (100 mg total) by mouth daily. 02/24/16  Yes Ivan Anchors Love, PA-C  fluticasone furoate-vilanterol (BREO ELLIPTA) 100-25 MCG/INH AEPB Inhale 1 puff into the lungs daily. 02/24/16  Yes Ivan Anchors Love, PA-C  folic acid (FOLVITE) 1 MG tablet Take 1 tablet (1 mg total) by mouth daily. 01/19/16  Yes Jerrye Beavers, PA-C  levalbuterol (XOPENEX) 0.63 MG/3ML nebulizer solution Take 3 mLs (0.63 mg total) by nebulization every 6 (six) hours as needed for wheezing or shortness of breath. 01/19/16  Yes Jerrye Beavers, PA-C  mirtazapine (REMERON) 15 MG tablet Take 1 tablet (15 mg total) by mouth at bedtime. 02/23/16  Yes Ivan Anchors Love, PA-C  Multiple Vitamin (MULTIVITAMIN WITH MINERALS) TABS tablet Take 1 tablet  by mouth daily.  01/19/16  Yes Jerrye Beavers, PA-C  pantoprazole (PROTONIX) 40 MG tablet Take 1 tablet (40 mg total) by mouth daily. 02/24/16  Yes Ivan Anchors Love, PA-C  QUEtiapine (SEROQUEL) 50 MG tablet Take 1 tablet (50 mg total) by mouth at bedtime. 02/23/16  Yes Ivan Anchors Love, PA-C  senna-docusate (SENOKOT-S) 8.6-50 MG tablet Take 1 tablet by mouth at bedtime. 02/23/16  Yes Ivan Anchors Love, PA-C  sertraline (ZOLOFT) 50 MG tablet Take 1.5 tablets (75 mg total) by mouth daily. 02/24/16  Yes Ivan Anchors Love, PA-C  thiamine 100 MG tablet Take 1 tablet (100 mg total) by mouth daily. 01/19/16  Yes Jerrye Beavers, PA-C  umeclidinium bromide (INCRUSE ELLIPTA) 62.5 MCG/INH AEPB Inhale 1 puff into the lungs daily. 02/24/16  Yes Ivan Anchors Love, PA-C  furosemide (LASIX) 20 MG tablet Take 1 tablet (20 mg total) by mouth 2 (two) times daily. Patient not taking: Reported on 02/27/2016 02/23/16   Ivan Anchors Love, PA-C  Nutritional Supplements (FEEDING SUPPLEMENT, VITAL AF 1.2 CAL,) LIQD Place 1,000 mLs into feeding tube continuous. 01/19/16   Jerrye Beavers, PA-C  potassium chloride (K-DUR,KLOR-CON) 10 MEQ tablet Take 1 tablet (10 mEq total) by mouth 2 (two) times daily. Patient not taking: Reported on 03/18/2016 02/23/16   Ivan Anchors Love, PA-C   No Known Allergies Review of Systems  Unable to perform ROS: Severe respiratory distress    Physical Exam  Constitutional:  Cachetic acutely ill appearing man in respiratory distress  Cardiovascular:  irrg  Pulmonary/Chest:  Increased work of breathing Tight, wheezy  Neurological: He is alert.  Skin: Skin is warm and dry.  Psychiatric:  anxious  Nursing note and vitals reviewed.   Vital Signs: BP (!) 88/62   Pulse (!) 107   Temp 97.4 F (36.3 C) (Axillary)   Resp (!) 26   Ht 5\' 10"  (1.778 m)   Wt 67.3 kg (148 lb 6.4 oz)   SpO2 98%   BMI 21.29 kg/m  Pain Assessment: No/denies pain POSS *See Group Information*: 1-Acceptable,Awake and alert Pain Score: 0-No pain   SpO2: SpO2:  98 % O2 Device:SpO2: 98 % O2 Flow Rate: .O2 Flow Rate (L/min): 2 L/min  IO: Intake/output summary:  Intake/Output Summary (Last 24 hours) at 03/15/16 1755 Last data filed at 03/15/16 1538  Gross per 24 hour  Intake              480 ml  Output              150 ml  Net              330 ml    LBM: Last BM Date: 02/13/16 Baseline Weight: Weight: 68.2 kg (150 lb 5.7 oz) Most recent weight: Weight: 67.3 kg (148 lb 6.4 oz)     Palliative Assessment/Data:   Flowsheet Rows   Flowsheet Row Most Recent Value  Intake Tab  Referral Department  Hospitalist  Unit at Time of Referral  Med/Surg Unit  Palliative Care Primary Diagnosis  Pulmonary  Date Notified  03/15/16  Palliative Care Type  New Palliative care  Reason for referral  Non-pain Symptom, Pain, Clarify Goals of Care, Psychosocial or Spiritual support  Date of Admission  03/18/2016  Date first seen by Palliative Care  03/15/16  # of days Palliative referral response time  0 Day(s)  # of days IP prior to Palliative referral  16  Clinical Assessment  Palliative Performance Scale Score  30%  Pain Max last 24 hours  Not able to report  Pain Min Last 24 hours  Not able to report  Dyspnea Max Last 24 Hours  Not able to report  Dyspnea Min Last 24 hours  Not able to report  Nausea Max Last 24 Hours  Not able to report  Nausea Min Last 24 Hours  Not able to report  Anxiety Max Last 24 Hours  Not able to report  Anxiety Min Last 24 Hours  Not able to report  Other Max Last 24 Hours  Not able to report  Psychosocial & Spiritual Assessment  Palliative Care Outcomes  Palliative Care follow-up planned  Yes, Facility      Time In: 1700 Time Out: 1900 Time Total: 120 min Greater than 50%  of this time was spent counseling and coordinating care related to the above assessment and plan. Staffed with Dr. Allyson Sabal  Signed by: Dory Horn, NP   Please contact Palliative Medicine Team phone at [phone] for questions and concerns.    For individual provider: See Shea Evans

## 2016-03-15 NOTE — Consult Note (Addendum)
S: Called by elink to see patient for evaluation for impending respiratory failure. Pt has been in and out of hospital since August, has had worsening heart failure, now w/ renal failure. Pt complains of dyspnea, has worsening pulmonary edema. Pt repeatedly asked for morphine and water.  O: Pt appeared quite frail, dyspneic. Worsening renal function despite aggressive tx w/ diuretics and ionotropes  A/P: I met with patient and family and discussed situation for 30 minutes. Pt stated he did not want to live if he could not live independently at home. Given this, I reccommended that he be DNR/DNI in the setting of his worsening MSOF and frailty, and to continue non-invasive (i.e. Medical therapies) and be "no escalation of care," with an eye toward moving toward full hospice. He was agreeable with this, as was his two daughters. He was given PRN morphine and liquid diet.  CRITICAL CARE Performed by: Luz Brazen   Total critical care time: 45 minutes  Critical care time was exclusive of separately billable procedures and treating other patients.  Critical care was necessary to treat or prevent imminent or life-threatening deterioration.  Critical care was time spent personally by me on the following activities: development of treatment plan with patient and/or surrogate as well as nursing, discussions with consultants, evaluation of patient's response to treatment, examination of patient, obtaining history from patient or surrogate, ordering and performing treatments and interventions, ordering and review of laboratory studies, ordering and review of radiographic studies, pulse oximetry and re-evaluation of patient's condition.  Luz Brazen, MD Pulmonary & Critical Care Medicine 06-Apr-2016, 2:46 AM    Luther Redo (daughter): [phone]

## 2016-03-15 NOTE — Patient Outreach (Signed)
[facility]) Care Management  03/15/2016  ORAS SPLITT 18-Mar-1936 [id number]   [name] is a 80 y/o M, smoker, with PMH of ETOH abuse, allergic rhinitis, psoriasis, skin cancer, osteoarthritis, CAD, HTN, atrial fibrillation (not on anticoagulation, Hx falls), systolic HF (EF 123456), HLD, pulmonary fibrosis and COPD admitted to [facility] on 02/25/2016 complaints of SOB. [name] had a prolonged recent hospitalization after a fall (several rib fractures) during which time he had a prolonged intubation requiring tracheostomy and admission to an LTAC followed by rehab. He discharged and presented back to the hospital critically ill with sepsis.  As of today, it has been recommended to [name] that a discussion of end of life issues is appropriate. However, he has been unwilling to discuss this to date. His cardiologist has consulted with the palliative care team.   Plan: [facility] Management will continue to follow [name] progress and will follow up with him within 72 hours post discharge.    [facility] Management  [phone]

## 2016-03-15 NOTE — Progress Notes (Signed)
Patient c/o SOB, new orders received for iv lasix, 2mg  of morphine and IV solumedrol, medications were administered, patient maintained and O2 sat of 100% on 2L of oxygen, vital signs obtained. Stat ABG done. Patient now in no acute distress, resting quietly in bed, order received to transfer patient to 4E, report given to nurse on 4E, patient transported by this RN and a NT to 4E14, with family following along.

## 2016-03-15 NOTE — Progress Notes (Addendum)
Patient having labored breathing and c/o SOB, albuterol nebulization given, benadryl given for anxiety, patient voiced no relief, Chad Schick MD paged no new order received, Abrol MD paged verbal order to give 2mg  of morphine was received, 2mg  of morphine given , patient resting quietly in bed, 2LO2 via nasal cannula on, call ligtht within reach will continue to monitor

## 2016-03-15 NOTE — Progress Notes (Signed)
Patient Name: Chad Rogers Date of Encounter: 03/15/2016   Primary Cardiologist: [facility] Problem List     Principal Problem:   Sepsis [facility]) Active Problems:   Hypertension   Pulmonary fibrosis ([place])   Tobacco abuse   Traumatic fracture of ribs with pneumothorax   Multiple rib fractures   Acute on chronic systolic congestive heart failure (HCC)   Atrial fibrillation, persistent ([place])   Debility   HCAP (healthcare-associated pneumonia)   Protein-calorie malnutrition, severe   Metabolic acidosis   CHF (congestive heart failure) (HCC)   Endotracheally intubated   Acute encephalopathy   AKI (acute kidney injury) ([place])   Hyperkalemia   Pressure injury of skin   Acute hypoxemic respiratory failure (HCC)   Acute pulmonary edema (HCC)   Acute combined systolic and diastolic heart failure (HCC)   Cardiogenic shock (HCC)   Acute renal failure with acute tubular necrosis superimposed on stage 2 chronic kidney disease (HCC)     Subjective   No cardiac complaints  Inpatient Medications    Scheduled Meds: . [START ON 03/20/2016] amiodarone  100 mg Oral Daily  . amiodarone  200 mg Oral BID  . budesonide (PULMICORT) nebulizer solution  0.5 mg Nebulization BID  . feeding supplement (ENSURE ENLIVE)  237 mL Oral QID  . folic acid  1 mg Oral Daily  . heparin  5,000 Units Subcutaneous Q8H  . hydrocortisone cream   Topical BID  . insulin aspart  0-5 Units Subcutaneous QHS  . insulin aspart  0-9 Units Subcutaneous TID WC  . ipratropium-albuterol  3 mL Nebulization TID  . mouth rinse  15 mL Mouth Rinse BID  . metoprolol tartrate  25 mg Oral BID  . mirtazapine  15 mg Oral QHS  . multivitamin with minerals  1 tablet Oral Daily  . pantoprazole  40 mg Oral Daily  . sodium chloride flush  10-40 mL Intracatheter Q12H  . thiamine  100 mg Oral Daily   Continuous Infusions: . sodium chloride Stopped (03/12/16 1100)   PRN Meds: acetaminophen, albuterol, diphenhydrAMINE,  sodium chloride flush   Vital Signs    Vitals:   03/15/16 0256 03/15/16 0301 03/15/16 0343 03/15/16 1025  BP:  (!) 98/55  (!) 99/55  Pulse:  94  93  Resp:  20  20  Temp:  97.4 F (36.3 C)    TempSrc:  Oral    SpO2:  100% 95% 100%  Weight: 148 lb 6.4 oz (67.3 kg)     Height:        Intake/Output Summary (Last 24 hours) at 03/15/16 1152 Last data filed at 03/14/16 1700  Gross per 24 hour  Intake              240 ml  Output              301 ml  Net              -61 ml   Filed Weights   03/13/16 0500 03/14/16 0500 03/15/16 0256  Weight: 145 lb 4.5 oz (65.9 kg) 147 lb 3.2 oz (66.8 kg) 148 lb 6.4 oz (67.3 kg)    Physical Exam   Frail appearing. No acute distress.    Labs    CBC  Recent Labs  03/14/16 0219 03/15/16 0440  WBC 16.7* 11.1*  NEUTROABS 13.9* 8.4*  HGB 9.2* 8.7*  HCT 30.3* 28.2*  MCV 102.0* 101.4*  PLT 198 [id number]   Basic Metabolic Panel  Recent Labs  03/13/16 0525 03/14/16 0219 03/15/16 0440  NA 139 133* 132*  K 3.9 4.2 4.5  CL 95* 92* 90*  CO2 30 28 28   GLUCOSE 120* 135* 126*  BUN 63* 66* 64*  CREATININE 1.29* 1.56* 1.86*  CALCIUM 9.2 9.3 9.2  MG 2.0 2.1 2.3  PHOS 3.6 4.0  --    Liver Function Tests  Recent Labs  03/14/16 0219 03/15/16 0440  AST  --  26  ALT  --  59  ALKPHOS  --  68  BILITOT  --  1.8*  PROT  --  5.9*  ALBUMIN 3.1* 2.9*   No results for input(s): LIPASE, AMYLASE in the last 72 hours. Cardiac Enzymes No results for input(s): CKTOTAL, CKMB, CKMBINDEX, TROPONINI in the last 72 hours. BNP Invalid input(s): POCBNP D-Dimer No results for input(s): DDIMER in the last 72 hours. Hemoglobin A1C No results for input(s): HGBA1C in the last 72 hours. Fasting Lipid Panel No results for input(s): CHOL, HDL, LDLCALC, TRIG, CHOLHDL, LDLDIRECT in the last 72 hours. Thyroid Function Tests No results for input(s): TSH, T4TOTAL, T3FREE, THYROIDAB in the last 72 hours.  Invalid input(s): FREET3  Telemetry    Atrial fib  with better rate control. - Personally Reviewed  ECG    No new tracing - Personally Reviewed  Radiology    Dg Ribs Bilateral  Result Date: 03/13/2016 CLINICAL DATA:  Left-sided chest pain with history recent fractured ribs EXAM: BILATERAL RIBS - 3+ VIEW COMPARISON:  03/11/2016 FINDINGS: Cardiac shadow remains enlarged. Right-sided central venous line is again seen in satisfactory position. Rib fractures are again noted on the left to include the fourth, fifth, sixth and seventh and eighth ribs. Some callus formation is noted at the eighth rib fracture. Additionally a healing ninth rib fracture is noted as well. No pneumothorax is seen. Healing rib fractures on the right are noted of the eighth through tenth ribs. Small right pleural effusion is noted. No pneumothorax is seen. IMPRESSION: Bilateral rib fractures with some degree of healing. No pneumothorax is noted. A small right-sided pleural effusion is seen. Electronically Signed   By: Inez Catalina M.D.   On: 03/13/2016 21:24    Cardiac Studies   No new data  Patient Profile     80 year old gentleman presenting with acute on chronic respiratory failure, history of chronic atrial fibrillation with rapid ventricular response and bradycardia, on chronic low-dose amiodarone(? for rate control) subsequent bradycardia/ high-grade AV block), not anticoagulated because of prior history of subdural hematoma and high bleeding risk, history of chronic systolic heart failure with EF of 25%, intubation after aggressive IV fluids in the setting of sepsis led to CHF.   Assessment & Plan    1. Atrial fibrillation with better rate control on increased dose of amiodarone. At some point will need to determine if amiodarone is the most appropriate therapy or if other means of rate control should be used. Beta blocker therapy is relatively contraindicated. Relatively low blood pressures also make it difficult to use calcium channel blocker therapy. Currently,  maintain amiodarone therapy.   Signed, Sinclair Grooms, MD  03/15/2016, 11:52 AM

## 2016-03-15 NOTE — Progress Notes (Signed)
   Called back to see the patient by the nurse who indicates that Dr. Allyson Sabal wanted me to see if there was anything we could do from the cardiac standpoint.  The patient is frail. He is in no respiratory distress. He has multiple complaints. Family is at the bedside. He is lying flat in bed. Neck veins are elevated when raised to 45. Breath sounds are distant with "cellophane-like rales" related to pulmonary fibrosis. O2 saturation 98% despite complaint of dyspnea. He is on oxygen.  The patient is severely deconditioned, chronically ill, and has severe pulmonary fibrosis with chronic cor pulmonale, recent pneumonia, and superimposed severe systolic heart failure. Overall prognosis is poor. Discussed this with the patient and his family. He is not prepared to discuss end-of-life issues although I do believe a palliative care consult would be helpful. I will leave this to the primary service.  Plan is to increase nutrition, provide physical therapy, and reinstitute diuretics when kidney dysfunction stabilizes.

## 2016-03-15 NOTE — Progress Notes (Signed)
PT Cancellation Note  Patient Details Name: Chad Rogers MRN: [id number] DOB: [birth year]   Cancelled Treatment:    Reason Eval/Treat Not Completed: Fatigue/lethargy limiting ability to participate;Other (comment) (could not get a measured O2 sat, talked with nursing).  Pt declined, cold hands and did talk with nursing about constellation of symptoms and pt refusal.   Ramond Dial 03/15/2016, 12:31 PM   Mee Hives, PT MS Acute Rehab Dept. Number: [place] and [place]

## 2016-03-15 NOTE — Progress Notes (Addendum)
[facility] progress note   Name: Chad Rogers MRN:   [id number] DOB:   September 04, 1935           ADMISSION DATE:  03/14/2016 CONSULTATION DATE:  03/04/16     [facility] assumed care  03/14/16   Brief Summary:   80 y/o M, smoker, with PMH of ETOH abuse, allergic rhinitis, psoriasis, skin cancer, osteoarthritis, CAD, HTN, atrial fibrillation (not on anticoagulation, Hx falls), systolic CHF (EF 123456), HLD, pulmonary fibrosis and COPD (previously followed by Dr. Melvyn Novas - 2011) admitted to [facility] on 02/23/2016 complaints of SOB.    The patient recently had a prolonged hospitalization after a fall with several rib fractures.  During that admit, he had a prolonged intubation requiring tracheostomy and admission to an LTAC followed by rehab. The patient was apparently discharged one week prior to presentation.  He reported nausea, vomiting, weakness initially that resolved.  Subsequently, he developed worsening shortness of breath & DOE.  On admit, he was hypotensive, hypoxic and had an elevated lactic acid (7).  Work up concerning for new infiltrate & volume depletion with AKI.  The patient was admitted per [facility] for further care.  He responded well to IVF's and oxygen initially.  He was later found to have pulmonary edema with associated effusions and treated with Lasix.  Follow up CXR evaluation showed enlarging effusions.  Cardiology was consulted for evaluation.  During their evaluation, he was found to have pronounced bradycardia and pauses.  Atropine was administered and he was started on an external pacemaker.  HR was labile and he was started on dopamine.  He had subsequent respiratory distress with hypoxemia (sats dropped to 70's) with altered level of consciousness and required intubation.  He developed further metabolic derangements (123XX123, CO2 18, Hgb 9.7) 10/16 and was transferred to [facility] for further care.        VITAL SIGNS: BP (!) 98/55 (BP Location: Left Arm)   Pulse 94   Temp 97.4 F  (36.3 C) (Oral)   Resp 20   Ht 5\' 10"  (1.778 m)   Wt 67.3 kg (148 lb 6.4 oz)   SpO2 95%   BMI 21.29 kg/m      INTAKE / OUTPUT: I/O last 3 completed shifts: In: 360 [P.O.:360] Out: 301 [Urine:300; Stool:1]  PHYSICAL EXAMINATION: GEN: Chronically ill appearing, thin, frail male on supplement oxygen HEENT: Grossly normal.  Neck: Supple, no JVD, carotid bruits, or masses. Cardiac: Irregular , no murmurs, rubs, or gallops. No clubbing, cyanosis, edema.  Radials/DP/PT 2+ and equal bilaterally.  Respiratory:  Respirations regular and unlabored, clear to auscultation bilaterally. GI: Soft, nontender, nondistended, BS + x 4. MS: no deformity or atrophy. Skin:diffuse ecchymosis on arms Neuro:  Strength and sensation are intact. Psych: AAOx3.  Normal affect.  LABS:  BMET  Recent Labs Lab 03/13/16 0525 03/14/16 0219 03/15/16 0440  NA 139 133* 132*  K 3.9 4.2 4.5  CL 95* 92* 90*  CO2 30 28 28   BUN 63* 66* 64*  CREATININE 1.29* 1.56* 1.86*  GLUCOSE 120* 135* 126*    Electrolytes  Recent Labs Lab 03/12/16 0406 03/13/16 0525 03/14/16 0219 03/15/16 0440  CALCIUM 9.3 9.2 9.3 9.2  MG 2.3 2.0 2.1 2.3  PHOS 4.3 3.6 4.0  --     CBC  Recent Labs Lab 03/13/16 0525 03/13/16 1704 03/14/16 0219 03/15/16 0440  WBC 10.8*  --  16.7* 11.1*  HGB 9.1* 9.7* 9.2* 8.7*  HCT 30.6* 32.1* 30.3* 28.2*  PLT 190  --  [place] Lab 03/10/16 0500 03/13/16 0525 03/14/16 0219 03/15/16 0440  AST 36  --   --  26  ALT 124*  --   --  59  ALKPHOS 90  --   --  68  BILITOT 1.1  --   --  1.8*  ALBUMIN 2.7* 3.0* 3.1* 2.9*    Cardiac Enzymes No results for input(s): TROPONINI, PROBNP in the last 168 hours.  Glucose  Recent Labs Lab 03/13/16 2151 03/14/16 0629 03/14/16 1104 03/14/16 1642 03/14/16 2119 03/15/16 0612  GLUCAP 106* 133* 148* 115* 108* 120*    Imaging No results found.   STUDIES:  CXR 10/16: mild pulmonary edema with right pleural  effusion and left basilar atx. CXR 10/23: New left basilar atelectasis.  The right pleural effusion is stable. Chest x-ray 10/25- Bilateral rib fractures with some degree of healing  MICROBIOLOGY: Blood Ctx x2 10/11:  Negative Urine Ctx 10/11:  Negative Tracheal Asp Ctx 10/16:  Oral Flora  ANTIBIOTICS: Vancomycin 10/11 - 10/16 Cefepime 10/11 - 10/19  SIGNIFICANT EVENTS: 10/11  admitted to [facility]. 10/16  transferred to [facility]. 10/17  ef 25%; baseline 10/20  Dobutamine being weaned off.  Mar 30, 2023  Cards deceased lasix. Renal function stable. Off dobutamine. Failed SBT with NSVT 10/22  Weaned 3 hours 10/23  Extubated  10/26 [facility] assumed care       Subjective- patient complaining of being weak and dizzy  ASSESSMENT / PLAN:  Acute Hypoxic Respiratory Failure - multifactorial Likely due to pulmonary edema. Extubated 10/23  with tenuous status. Possible HCAP - S/P completion of antibiotics H/O Pulmonary Fibrosis/COPD followed by Dr. Melvyn Novas Tobacco Use Disorder Continuing Budesonide neb bid Continuing Duoneb TID Albuterol Nebs prn Holding Lasix for now, in the setting of worsening renal function Tobacco Cessation Education prior to discharge Incentive Spirometry & Flutter valve for pulmonary toilette  resume home Breo prior to discharge    Shock - Cardiogenic. Resolved. Acute on Chronic Systolic CHF - EF 123XX123 Elevated Troponin I - Likely demand ischemia. H/O Atrial Fibrillation/Flutter Sinus Bradycardia w/ Pauses - s/p atropine and external pacing followed by dobutamine, resolved. NSVT - during PSV wean, resolved PICC line placed 10/17  overall 6 L negative since admission Continuous telemetry monitoring Cardiology following & appreciate input Holding Lasix for now Amiodarone PO daily  increase amiodarone to 200 mg twice a day for 5 days then back to 100 mg daily. Decrease Metoprolol PO bid, given low BP? And worsening renal function Continue fluid restriction 1500cc fluids per  day      Acute Renal Failure - 1.29>> 1.86 Hyponatremia - improving Hypokalemia - Resolved. Hypomagnesemia - Resolved. Hypophosphatemia - Resolved.  Trending UOP Monitoring electrolytes & renal function daily Replacing electrolytes as indicated Holding diuresis for today, check renal ultrasound     Transaminitis - Likely due to congestion from heart failure. Resolving. H/O EtOH Use Continue Mechanical Soft Diet Protonix PO daily    Anemia - Mild. No signs of active bleeding. Did have decrease I hemoglobin today. Trending cell counts w/ CBC daily Hemoglobin slowly trending down 9.7>8.7 Platelets normal    Sepsis - Possible. HCAP - completed 8d of cefepime.  Monitor for signs/symptoms of infection     Hyperglycemia - No h/o DM. A1c 4.9. Accu-checks qAC & HS Continue SSI    Acute Encephalopathy - Resolved. H/O EtOH Use Thiamine & FA to PO daily Continuing home Remeron  Holding home Zoloft & Seroquel pending repeat  QTc    Left Chest Wall Pain - Likely due to recent rib fractures after fall. X-ray showed bilateral rib fractures no pneumothorax, D/C Ibuprofen Tylenol PO Prn     Disposition- chronically ill with multiple comorbidities, now with worsening renal function, overall prognosis grim, palliative care consultation to discuss goals of care, CODE STATUS, PT OT consultation requested  Addendum Patient has had progressive worsening SOB today Discussed with Dr Tamala Julian , they feel his respiratory distress is multifactorial Not so much CHF ,  Continue supportive care with BiPAP , steroids , lasix in the face of worsening renal fn to relieve his air hunger  Palliative care is evaluating him, All providers feel that he is at the end of the road, here

## 2016-03-16 DIAGNOSIS — N170 Acute kidney failure with tubular necrosis: Secondary | ICD-10-CM

## 2016-03-16 DIAGNOSIS — N182 Chronic kidney disease, stage 2 (mild): Secondary | ICD-10-CM

## 2016-03-16 DIAGNOSIS — Z515 Encounter for palliative care: Secondary | ICD-10-CM

## 2016-03-16 DIAGNOSIS — R627 Adult failure to thrive: Secondary | ICD-10-CM

## 2016-03-16 DIAGNOSIS — I469 Cardiac arrest, cause unspecified: Secondary | ICD-10-CM

## 2016-03-16 LAB — CBC WITH DIFFERENTIAL/PLATELET
Basophils Absolute: 0 10*3/uL (ref 0.0–0.1)
Basophils Relative: 0 %
EOS ABS: 0 10*3/uL (ref 0.0–0.7)
Eosinophils Relative: 0 %
HEMATOCRIT: 30 % — AB (ref 39.0–52.0)
HEMOGLOBIN: 9.3 g/dL — AB (ref 13.0–17.0)
LYMPHS ABS: 1.9 10*3/uL (ref 0.7–4.0)
LYMPHS PCT: 23 %
MCH: 31.2 pg (ref 26.0–34.0)
MCHC: 31 g/dL (ref 30.0–36.0)
MCV: 100.7 fL — AB (ref 78.0–100.0)
MONOS PCT: 2 %
Monocytes Absolute: 0.2 10*3/uL (ref 0.1–1.0)
NEUTROS ABS: 6 10*3/uL (ref 1.7–7.7)
NEUTROS PCT: 75 %
Platelets: 290 10*3/uL (ref 150–400)
RBC: 2.98 MIL/uL — ABNORMAL LOW (ref 4.22–5.81)
RDW: 18.8 % — ABNORMAL HIGH (ref 11.5–15.5)
WBC: 8 10*3/uL (ref 4.0–10.5)

## 2016-03-16 LAB — COMPREHENSIVE METABOLIC PANEL
ALK PHOS: 74 U/L (ref 38–126)
ALT: 57 U/L (ref 17–63)
AST: 26 U/L (ref 15–41)
Albumin: 3.1 g/dL — ABNORMAL LOW (ref 3.5–5.0)
Anion gap: 16 — ABNORMAL HIGH (ref 5–15)
BUN: 85 mg/dL — AB (ref 6–20)
CALCIUM: 9.2 mg/dL (ref 8.9–10.3)
CO2: 25 mmol/L (ref 22–32)
CREATININE: 2.47 mg/dL — AB (ref 0.61–1.24)
Chloride: 87 mmol/L — ABNORMAL LOW (ref 101–111)
GFR, EST AFRICAN AMERICAN: 27 mL/min — AB (ref >60)
GFR, EST NON AFRICAN AMERICAN: 23 mL/min — AB (ref >60)
Glucose, Bld: 166 mg/dL — ABNORMAL HIGH (ref 65–99)
Potassium: 5.7 mmol/L — ABNORMAL HIGH (ref 3.5–5.1)
Sodium: 128 mmol/L — ABNORMAL LOW (ref 135–145)
Total Bilirubin: 1.9 mg/dL — ABNORMAL HIGH (ref 0.3–1.2)
Total Protein: 6.6 g/dL (ref 6.5–8.1)

## 2016-03-16 LAB — MAGNESIUM: Magnesium: 2.5 mg/dL — ABNORMAL HIGH (ref 1.7–2.4)

## 2016-03-16 LAB — GLUCOSE, CAPILLARY
GLUCOSE-CAPILLARY: 153 mg/dL — AB (ref 65–99)
GLUCOSE-CAPILLARY: 132 mg/dL — AB (ref 65–99)

## 2016-03-16 MED ORDER — LORAZEPAM 0.5 MG PO TABS: 0.5000 mg | ORAL_TABLET | Freq: Four times a day (QID) | ORAL | Status: DC | PRN

## 2016-03-16 MED ORDER — MORPHINE BOLUS VIA INFUSION
2.0000 mg | INTRAVENOUS | Status: DC | PRN
  Filled 2016-03-16: qty 2

## 2016-03-16 MED ORDER — GLYCOPYRROLATE 0.2 MG/ML IJ SOLN
0.4000 mg | Freq: Four times a day (QID) | INTRAMUSCULAR | Status: DC
  Administered 2016-03-16: 0.4 mg via INTRAVENOUS
  Filled 2016-03-16: qty 2

## 2016-03-16 MED ORDER — LORAZEPAM 2 MG/ML IJ SOLN: 1.0000 mg | INTRAMUSCULAR | Status: DC | PRN

## 2016-03-16 MED ORDER — GLYCOPYRROLATE 0.2 MG/ML IJ SOLN: 0.4000 mg | INTRAMUSCULAR | Status: DC | PRN

## 2016-03-16 MED ORDER — ONDANSETRON HCL 4 MG/2ML IJ SOLN
4.0000 mg | Freq: Four times a day (QID) | INTRAMUSCULAR | Status: DC | PRN
  Administered 2016-03-16: 4 mg via INTRAVENOUS
  Filled 2016-03-16: qty 2

## 2016-03-16 MED ORDER — LORAZEPAM 2 MG/ML IJ SOLN
0.5000 mg | Freq: Four times a day (QID) | INTRAMUSCULAR | Status: DC | PRN
  Administered 2016-03-16: 18:00:00 via INTRAVENOUS
  Filled 2016-03-16: qty 1

## 2016-03-16 MED ORDER — ACETAMINOPHEN 650 MG RE SUPP: 650.0000 mg | RECTAL | Status: DC | PRN

## 2016-03-16 MED ORDER — SODIUM CHLORIDE 0.9 % IV SOLN
2.0000 mg/h | INTRAVENOUS | Status: DC
  Administered 2016-03-16: 2 mg/h via INTRAVENOUS
  Filled 2016-03-16: qty 5

## 2016-03-16 MED ORDER — MORPHINE SULFATE (PF) 2 MG/ML IV SOLN
2.0000 mg | INTRAVENOUS | Status: AC
  Administered 2016-03-16: 2 mg via INTRAVENOUS
  Filled 2016-03-16: qty 1

## 2016-03-16 MED ORDER — MORPHINE SULFATE (PF) 2 MG/ML IV SOLN
2.0000 mg | Freq: Four times a day (QID) | INTRAVENOUS | Status: DC
  Administered 2016-03-16: 2 mg via INTRAVENOUS
  Filled 2016-03-16: qty 1

## 2016-03-16 MED ORDER — MORPHINE SULFATE (PF) 2 MG/ML IV SOLN
2.0000 mg | INTRAVENOUS | Status: DC | PRN
  Administered 2016-03-16: 2 mg via INTRAVENOUS
  Administered 2016-03-16: 4 mg via INTRAVENOUS
  Administered 2016-03-16: 2 mg via INTRAVENOUS
  Filled 2016-03-16 (×2): qty 1
  Filled 2016-03-16: qty 2

## 2016-03-16 MED ORDER — MORPHINE SULFATE (PF) 2 MG/ML IV SOLN: 2.0000 mg | Freq: Four times a day (QID) | INTRAVENOUS | Status: DC

## 2016-03-18 ENCOUNTER — Other Ambulatory Visit: Payer: Self-pay | Admitting: *Deleted

## 2016-03-18 LAB — BLOOD GAS, ARTERIAL
Acid-base deficit: 2.1 mmol/L — ABNORMAL HIGH (ref 0.0–2.0)
Bicarbonate: 21 mmol/L (ref 20.0–28.0)
DRAWN BY: 358491
O2 Content: 3 L/min
O2 Saturation: 98.4 %
PCO2 ART: 28.6 mmHg — AB (ref 32.0–48.0)
PH ART: 7.478 — AB (ref 7.350–7.450)
Patient temperature: 98.6
pO2, Arterial: 112 mmHg — ABNORMAL HIGH (ref 83.0–108.0)

## 2016-03-18 NOTE — Patient Outreach (Signed)
[facility]) Care Management  03/18/2016  DAMETRIUS HAGOPIAN 10-15-1935 [id number]  We regret to hear of Mr. Youngkin passing and appreciate the opportunity to participate in his care.   Plan: Case Closure.    [facility]  [phone]

## 2016-03-20 NOTE — Progress Notes (Addendum)
[facility] progress note   Name: Chad Rogers MRN:   [id number] DOB:   07-Oct-1935           ADMISSION DATE:  03/12/2016 CONSULTATION DATE:  03/04/16     [facility] assumed care  03/14/16   Brief Summary:   80 y/o M, smoker, with PMH of ETOH abuse, allergic rhinitis, psoriasis, skin cancer, osteoarthritis, CAD, HTN, atrial fibrillation (not on anticoagulation, Hx falls), systolic CHF (EF 16%), HLD, pulmonary fibrosis and COPD (previously followed by Dr. Melvyn Novas - 2011) admitted to [facility] on 03/15/2016 with complaints of SOB.    The patient recently had a prolonged hospitalization after a fall with several rib fractures. During that admit, he had a prolonged intubation requiring tracheostomy and admission to an LTAC followed by rehab. The patient was apparently discharged one week prior to presentation. He reported nausea, vomiting, weakness initially that resolved. Subsequently, he developed worsening shortness of breath & DOE. On admit, he was hypotensive, hypoxic and had an elevated lactic acid (7). Work up concerning for new infiltrate & volume depletion with AKI.  The patient was admitted per [facility] for further care. He responded well to IVF's and oxygen initially. He was later found to have pulmonary edema with associated effusions and treated with Lasix.  Follow up CXR evaluation showed enlarging effusions. Cardiology was consulted for evaluation.  During their evaluation, he was found to have pronounced bradycardia and pauses.  Atropine was administered and he was started on an external pacemaker. HR was labile and he was started on dopamine. He had subsequent respiratory distress with hypoxemia (sats dropped to 70's) with altered level of consciousness and required intubation.  He developed further metabolic derangements (O0.6, CO2 18, Hgb 9.7) 10/16 and was transferred to [facility] for further care.        VITAL SIGNS: Vitals:   2016/04/14 0005 2016/04/14 0418 14-Apr-2016 0753 04-14-16 0806  BP:  94/72 93/75 (!) 89/72   Pulse: (!) 102 (!) 106    Resp: (!) 23 (!) 22    Temp: 97 F (36.1 C) 97.3 F (36.3 C) (!) 96.4 F (35.8 C)   TempSrc: Axillary Axillary Axillary   SpO2: 93% 93%  100%  Weight:  67.8 kg (149 lb 7.6 oz)    Height:           INTAKE / OUTPUT: I/O last 3 completed shifts: In: 600 [P.O.:600] Out: 200 [Urine:200]  PHYSICAL EXAMINATION: GEN: Pleasant elderly male, moderately built, chronically ill-looking, frail and cachectic, with mild increased work of breathing even while speaking. HEENT: Grossly normal.  Neck: Supple, no JVD, carotid bruits, or masses. Cardiac: Irregular , no murmurs, rubs, or gallops. No clubbing, cyanosis, edema.  Radials/DP/PT 2+ and equal bilaterally. Telemetry:? Atrial flutter/sinus tachycardia with BBB morphology in the 100s. Respiratory:   reduced breath sounds bilaterally/harsh breath sounds. Scattered few crackles and expiratory rhonchi. Mild increased work of breathing even while speaking. GI: Soft, nontender, nondistended, BS + x 4. MS: no deformity or atrophy. Skin:diffuse ecchymosis on arms.  Neuro:  Strength and sensation are intact. No focal neurological deficits. Psych: AAOx3.  Normal affect.  LABS:  BMET  Recent Labs Lab 03/14/16 0219 03/15/16 0440 2016/04/14 0500  NA 133* 132* 128*  K 4.2 4.5 5.7*  CL 92* 90* 87*  CO2 _0 BUN 66* 64* 85*  CREATININE 1.56* 1.86* 2.47*  GLUCOSE 135* 126* 166*    Electrolytes  Recent Labs Lab 03/12/16 0406 03/13/16 0525 03/14/16 0219 03/15/16 0440  04/04/16 0500  CALCIUM 9.3 9.2 9.3 9.2 9.2  MG 2.3 2.0 2.1 2.3 2.5*  PHOS 4.3 3.6 4.0  --   --     CBC  Recent Labs Lab 03/14/16 0219 03/15/16 0440 04-04-2016 0500  WBC 16.7* 11.1* 8.0  HGB 9.2* 8.7* 9.3*  HCT 30.3* 28.2* 30.0*  PLT 198 215 290       Recent Labs Lab 03/10/16 0500  03/14/16 0219 03/15/16 0440 04/04/16 0500  AST 36  --   --  26 26  ALT 124*  --   --  59 57  ALKPHOS 90  --   --  68 74   BILITOT 1.1  --   --  1.8* 1.9*  ALBUMIN 2.7*  < > 3.1* 2.9* 3.1*  < > = values in this interval not displayed.  Cardiac Enzymes No results for input(s): TROPONINI, PROBNP in the last 168 hours.  Glucose  Recent Labs Lab 03/14/16 1642 03/14/16 2119 03/15/16 0612 03/15/16 1106 03/15/16 1627 03/15/16 2112  GLUCAP 115* 108* 120* 107* 142* 127*    Imaging Dg Chest Port 1 View  Result Date: 03/15/2016 CLINICAL DATA:  Shortness of breath, hypertension. EXAM: PORTABLE CHEST 1 VIEW COMPARISON:  Chest radiograph 03/13/2016 FINDINGS: Right-sided PICC line catheter tip is at the lower SVC, unchanged. There is persistent cardiomegaly. There is bilateral retrocardiac consolidation. A veiling opacity over the right lung base suggests pleural effusion. The costophrenic angles are excluded from the field of view. There is no sizable pneumothorax. Multiple rib fractures. IMPRESSION: 1. Bilateral retrocardiac consolidation. 2. Small right pleural effusion. Electronically Signed   By: Ulyses Jarred M.D.   On: 03/15/2016 18:42     STUDIES:  CXR 10/16: mild pulmonary edema with right pleural effusion and left basilar atx. CXR 10/23: New left basilar atelectasis.  The right pleural effusion is stable. Chest x-ray 10/25- Bilateral rib fractures with some degree of healing  MICROBIOLOGY: Blood Ctx x2 10/11:  Negative Urine Ctx 10/11:  Negative Tracheal Asp Ctx 10/16:  Oral Flora  ANTIBIOTICS: Vancomycin 10/11 - 10/16 Cefepime 10/11 - 10/19  SIGNIFICANT EVENTS: 10/11  admitted to [facility]. 10/16  transferred to [facility]. 10/17  ef 25%; baseline 10/20  Dobutamine being weaned off.  March 29, 2023  Cards deceased lasix. Renal function stable. Off dobutamine. Failed SBT with NSVT 10/22  Weaned 3 hours 10/23  Extubated  10/26 [facility] assumed care       Subjective- patient complains of on and off dyspnea. Intermittent loose stools. No pain issues.  ASSESSMENT / PLAN:  Acute Hypoxic Respiratory Failure -  multifactorial Likely due to pulmonary edema. Extubated 10/23  with tenuous status. Possible HCAP - S/P completion of antibiotics H/O Pulmonary Fibrosis/COPD followed by Dr. Melvyn Novas Tobacco Use Disorder Continuing Budesonide neb bid Continuing Duoneb TID Albuterol Nebs prn Holding Lasix for now, in the setting of worsening renal function Tobacco Cessation Education prior to discharge Incentive Spirometry & Flutter valve for pulmonary toilette  resume home Breo prior to discharge  Patient rapidly declining. Overall poor prognosis. Consulted palliative care team for goals of care. Discussed with Ms. Romona Curls on 2016-04-04 who met with patient and patient is wishing to transition to comfort oriented care. Note that CCM was consulted overnight 10/27 due to impending respiratory failure, they discussed with patient and after patient's consent, status changed to DO NOT RESUSCITATE/DO NOT INTUBATE with focused towards hospice.    Shock - Cardiogenic. Resolved. Acute on Chronic Systolic CHF - EF 94-17% Elevated Troponin I -  Likely demand ischemia. H/O Atrial Fibrillation/Flutter Sinus Bradycardia w/ Pauses - s/p atropine and external pacing followed by dobutamine, resolved. NSVT - during PSV wean, resolved PICC line placed 10/17  overall 4 L negative since admission Continuous telemetry monitoring >may discontinue telemetry but patient is on stepdown unit and as per unit protocol, will need to be on telemetry unless patient is transferred out to a medical bed. We'll hold off transfer due to his likelihood of rapid decline and demise. Cardiology following & appreciate input. Dr. Tamala Julian, cardiology input from 03/15/16 appreciated. He indicates that patient's overall prognosis is poor. He has discussed same with patient and his family. Palliative care input was recommended. Holding Lasix for now Amiodarone PO daily  increase amiodarone to 200 mg twice a day for 5 days then back to 100 mg  daily. Decrease Metoprolol PO bid, given low BP? And worsening renal function Continue fluid restriction 1500cc fluids per day      Acute Renal Failure - 1.29>> 1.86 Hyponatremia - improving Hypokalemia - Resolved. Hypomagnesemia - Resolved. Hypophosphatemia - Resolved.  Trending UOP Monitoring electrolytes & renal function daily Replacing electrolytes as indicated Holding diuresis for today, check renal ultrasound  Creatinine continues to worsen. Mild hyperkalemia 5.7. Patient declines treatment for same despite aware of risks.     Transaminitis - Likely due to congestion from heart failure. Resolving. H/O EtOH Use Continue Mechanical Soft Diet Protonix PO daily    Anemia - Mild. No signs of active bleeding. Did have decrease I hemoglobin today. Trending cell counts w/ CBC daily Hemoglobin slowly trending down 9.7>8.7 Platelets normal    Sepsis - Possible. HCAP - completed 8d of cefepime.  Monitor for signs/symptoms of infection     Hyperglycemia - No h/o DM. A1c 4.9. Accu-checks qAC & HS Continue SSI    Acute Encephalopathy - Resolved. H/O EtOH Use Thiamine & FA to PO daily Continuing home Remeron  Holding home Zoloft & Seroquel pending repeat QTc    Left Chest Wall Pain - Likely due to recent rib fractures after fall. X-ray showed bilateral rib fractures no pneumothorax, D/C Ibuprofen Tylenol PO Prn  Adult failure to thrive - Multifactorial, rapidly declining and extremely poor prognosis.    Disposition- chronically ill with multiple comorbidities, now with worsening renal function, overall prognosis grim. Multisystem failure. Updates as above. Palliative care has seen and transitioning to comfort oriented care. Possibly in-hospital demise.  Vernell Leep, MD, FACP, FHM. [facility] Pager [phone]  If 7PM-7AM, please contact night-coverage [email or web address] Password [email or web address] March 29, 2016, 12:58 PM

## 2016-03-20 NOTE — Discharge Summary (Addendum)
Chad Rogers, is a 80 y.o. male  DOB 25-Nov-1935  MRN [id number].  Admission date:  03/07/2016  Admitting Physician  Rush Landmark, MD  Discharge Date:  April 03, 2016   TOD 1940  Primary MD  [name], MD   Admission Diagnosis  Hyponatremia [H60.7] Metabolic acidosis [P71.0] Healthcare-associated pneumonia [J18.9] Sepsis, due to unspecified organism [facility]) [A41.9]   Discharge Diagnosis   TOD 1940 (Apr 03, 2016) Dx Arrythmia Significant conditions CHF (EF 25%), Copd, Pulmonary fibrosis, Hcap   Principal Problem:   Sepsis ([place]) Active Problems:   Hypertension   Pulmonary fibrosis (HCC)   Tobacco abuse   Traumatic fracture of ribs with pneumothorax   Multiple rib fractures   Acute on chronic systolic congestive heart failure (HCC)   Atrial fibrillation, persistent ([place])   Debility   HCAP (healthcare-associated pneumonia)   Protein-calorie malnutrition, severe   Metabolic acidosis   CHF (congestive heart failure) (HCC)   Endotracheally intubated   Acute encephalopathy   AKI (acute kidney injury) ([place])   Hyperkalemia   Pressure injury of skin   Acute hypoxemic respiratory failure (HCC)   Acute pulmonary edema (HCC)   Acute combined systolic and diastolic heart failure (HCC)   Cardiogenic shock (HCC)   Acute renal failure with acute tubular necrosis superimposed on stage 2 chronic kidney disease ([place])   Palliative care encounter   Failure to thrive in adult      Past Medical History:  Diagnosis Date  . Alcohol abuse    family reports that [pt will drink at least 6 beers or more a day,   . Allergic rhinitis   . Atrial fibrillation ([place])    Not on anticoagulation  . Cardiomyopathy [id number]   Acute systolic CHF at presentation; 05/2009 cardiac cath- 06/01/09  non obst coronary artery disease with EF 25%  . CHF (congestive heart failure) ([place])   . COPD (chronic  obstructive pulmonary disease) ([place])   . Coronary artery disease   . Hypertension   . Osteoarthritis   . Psoriasis   . Pulmonary fibrosis ([place])    12/2004 with basilar fibrotic changes;  PFT's 06/07/09 FEV1 1.98 (68%) raio 53 no better after B2,  DLC0105%  . Pulmonary fibrosis ([place])   . Skin cancer   . Subdural hygroma 03/2005   bilateral   . Tobacco abuse    100 pack years; quit    Past Surgical History:  Procedure Laterality Date  . CHEST TUBE INSERTION    . HAND SURGERY    . INGUINAL HERNIA REPAIR    . PERCUTANEOUS TRACHEOSTOMY N/A 01/10/2016   Procedure: PERCUTANEOUS TRACHEOSTOMY;  Surgeon: Judeth Horn, MD;  Location: [facility];  Service: General;  Laterality: N/A;  . ROTATOR CUFF REPAIR         HPI  from the history and physical done on the day of admission:    80 y/o M, smoker, with PMH of ETOH abuse, allergic rhinitis, psoriasis, skin cancer, osteoarthritis, CAD, HTN, atrial fibrillation (not on anticoagulation,  Hx falls), systolic CHF (EF 11%), HLD, pulmonary fibrosis and COPD (previously followed by Dr. Melvyn Novas - 2011) admitted to [facility] on 03/01/2016 complaints of SOB.    The patient recently had a prolonged hospitalization after a fall with several rib fractures.  During that admit, he had a prolonged intubation requiring tracheostomy and admission to an LTAC followed by rehab.  The patient was apparently discharged one week prior to presentation.  He reported nausea, vomiting, weakness initially that resolved.  Subsequently, he developed worsening shortness of breath & DOE.  On admit, he was hypotensive, hypoxic and had an elevated lactic acid (7).  Work up concerning for new infiltrate & volume depletion with AKI.  The patient was admitted per [facility] for further care.  He responded well to IVF's and oxygen initially.  He was later found to have pulmonary edema with associated effusions and treated with Lasix.  Follow up CXR evaluation showed enlarging effusions.  Cardiology  was consulted for evaluation.  During their evaluation, he was found to have pronounced bradycardia and pauses.  Atropine was administered and he was started on an external pacemaker.  HR was labile and he was started on dopamine.  He had subsequent respiratory distress with hypoxemia (sats dropped to 70's) with altered level of consciousness and required intubation.  He developed further metabolic derangements (X7.2, CO2 18, Hgb 9.7) 10/16 and was transferred to [facility] for further care.       Hospital Course:      Pt was intubated on 03/04/2016 for acute hypoxic respiratory failure.  Pt was continued on cefepime.  For bradycardia,  Pt was started on dobutamine, tc pacing and cardiology was consulted.  Echocardiogram 10/17=> EF 25-30%,  Severe diffuse hypokinesis, CT chest 10/17=> bilateral effusions (small), multiple bialteral rib fractures,  Multiple tiny bilateral ground glass pulmonary nodules nonspecific.   Cefepime discontinued on 10/19.  transaminitis thought to be secondary to pulmonary edema.  Pt continued to be diuresed by cardiology.  Due to Afib pt was treated with amiodarone.  Dobutamine was discontinued on 10/21.  Pt extubated on 10/23.  Pt continued to have dyspnea.  Diuresis limited by bp and renal function.  Attempt made to increase amiodarone to 217m po bid to control Afib.  hgb slowly trending down to 8.7 on 10/27.  Due to continued overall poor prognosis and continued decline palliative care met with [name] 102-Nov-2024and family decided to transition to comfort oriented care, DNR.  [place] 111/02/2024[place]  w family at bedside   TOD 1940 (1Nov 02, 2017 Dx Arrythmia Significant conditions CHF, Copd, Pulmonary fibrosis, Hcap    Major procedures and Radiology Reports - PLEASE review detailed and final reports for all details, in brief -     Dg Chest 2 View  Result Date: 03/02/2016 CLINICAL DATA:  Shortness of breath for 1 week EXAM: CHEST  2 VIEW COMPARISON:   02/15/2016 FINDINGS: Cardiac shadow is enlarged. Aortic calcifications are again seen. Findings of prior granulomatous disease are noted. Small left pleural effusion is again identified. The right-sided effusion has resolved in the interval. Patchy changes are noted in the left lung base consistent with early infiltrate. These have increased in the interval from the prior exam. IMPRESSION: Left lower lobe infiltrate with associated effusion. Electronically Signed   By: MInez CatalinaM.D.   On: 02/26/2016 18:12   Dg Ribs Bilateral  Result Date: 03/13/2016 CLINICAL DATA:  Left-sided chest pain with history recent fractured ribs EXAM:  BILATERAL RIBS - 3+ VIEW COMPARISON:  03/11/2016 FINDINGS: Cardiac shadow remains enlarged. Right-sided central venous line is again seen in satisfactory position. Rib fractures are again noted on the left to include the fourth, fifth, sixth and seventh and eighth ribs. Some callus formation is noted at the eighth rib fracture. Additionally a healing ninth rib fracture is noted as well. No pneumothorax is seen. Healing rib fractures on the right are noted of the eighth through tenth ribs. Small right pleural effusion is noted. No pneumothorax is seen. IMPRESSION: Bilateral rib fractures with some degree of healing. No pneumothorax is noted. A small right-sided pleural effusion is seen. Electronically Signed   By: Inez Catalina M.D.   On: 03/13/2016 21:24   Ct Chest Wo Contrast  Result Date: 03/05/2016 CLINICAL DATA:  Respiratory failure. EXAM: CT CHEST WITHOUT CONTRAST TECHNIQUE: Multidetector CT imaging of the chest was performed following the standard protocol without IV contrast. COMPARISON:  Chest radiograph 03/05/2016 ; chest CT 12/28/2015. FINDINGS: Cardiovascular: Heart is enlarged. Aortic atherosclerosis. Coronary arterial calcifications. Mediastinum/Nodes: ET tube terminates in the mid trachea. Enteric tube tip terminates in the stomach. No enlarged axillary, mediastinal  or hilar lymphadenopathy. Lungs/Pleura: Central airways are patent. Moderate right and small left pleural effusions. Underlying ground-glass and consolidative opacities within the lower lobes bilaterally. No pneumothorax. Multiple small scattered predominately ground-glass pulmonary nodules are demonstrated throughout the aerated upper lungs bilaterally, predominately the right upper lobe (image 27; series 205). Upper Abdomen: High attenuation material within the gallbladder lumen. Musculoskeletal: Multiple bilateral rib fractures are demonstrated, many with early callus formation. There are multiple displaced rib fractures involving the left fourth-eighth ribs. IMPRESSION: Moderate right and small left pleural effusions. A portion of the superior aspect of the left pleural effusion may be loculated. Underlying consolidation within the lower lobes bilaterally likely represents atelectasis. Infection not excluded. Multiple tiny bilateral ground-glass pulmonary nodules are nonspecific and may be infectious/inflammatory in etiology. After resolution of the acute symptomatology, recommend follow-up chest CT to evaluate for resolution or persistence. Multiple bilateral rib fractures. Many of the lateral left rib fractures are foreshortened and overlapping. Cardiomegaly. Aortic atherosclerosis. High attenuation in the gallbladder lumen may represent sludge. Electronically Signed   By: Lovey Newcomer M.D.   On: 03/05/2016 17:21   US Renal  Result Date: 2016/04/13 CLINICAL DATA:  Acute kidney injury EXAM: RENAL / URINARY TRACT ULTRASOUND COMPLETE COMPARISON:  CT abdomen pelvis 12/29/2015 FINDINGS: Right Kidney: Length: 11.2 cm. Echogenicity within normal limits. No mass or hydronephrosis visualized. Left Kidney: Length: 10.7 cm. Echogenicity within normal limits. No mass or hydronephrosis visualized. Bladder: Appears normal for degree of bladder distention. Mild ascites.  Bilateral pleural effusions. IMPRESSION: Negative  renal ultrasound. Mild ascites. Bilateral pleural effusions. Electronically Signed   By: Franchot Gallo M.D.   On: 04-13-2016 07:38   Dg Chest Port 1 View  Result Date: 03/15/2016 CLINICAL DATA:  Shortness of breath, hypertension. EXAM: PORTABLE CHEST 1 VIEW COMPARISON:  Chest radiograph 03/13/2016 FINDINGS: Right-sided PICC line catheter tip is at the lower SVC, unchanged. There is persistent cardiomegaly. There is bilateral retrocardiac consolidation. A veiling opacity over the right lung base suggests pleural effusion. The costophrenic angles are excluded from the field of view. There is no sizable pneumothorax. Multiple rib fractures. IMPRESSION: 1. Bilateral retrocardiac consolidation. 2. Small right pleural effusion. Electronically Signed   By: Ulyses Jarred M.D.   On: 03/15/2016 18:42   Dg Chest Port 1 View  Result Date: 03/11/2016 CLINICAL DATA:  Acute respiratory  failure EXAM: PORTABLE CHEST 1 VIEW COMPARISON:  03/10/2016 FINDINGS: Cardiac shadow is stable. An endotracheal tube, nasogastric catheter and right PICC line are again seen and stable. New mild left basilar atelectasis is seen. Persistent right pleural effusion is noted. IMPRESSION: New left basilar atelectasis.  The right pleural effusion is stable. Electronically Signed   By: Inez Catalina M.D.   On: 03/11/2016 07:37   Dg Chest Port 1 View  Result Date: 03/10/2016 CLINICAL DATA:  Check endotracheal tube placement EXAM: PORTABLE CHEST 1 VIEW COMPARISON:  03/07/2016 FINDINGS: Cardiac shadow is stable. A right-sided PICC line, endotracheal tube and nasogastric catheter are again seen and stable. Calcified granuloma is noted in the right lung base. Right pleural effusion is noted posteriorly. No focal infiltrate is seen. Healing right rib fractures are noted. IMPRESSION: Tubes and lines as described above. Right-sided pleural effusion. Electronically Signed   By: Inez Catalina M.D.   On: 03/10/2016 08:15   Dg Chest Port 1  View  Result Date: 03/07/2016 CLINICAL DATA:  Respiratory failure. EXAM: PORTABLE CHEST 1 VIEW COMPARISON:  03/05/2016. FINDINGS: Endotracheal tube, NG tube, right PICC line in stable position. Unchanged cardiomegaly. Unchanged bibasilar infiltrates and or edema. Small right pleural effusion. No pneumothorax. IMPRESSION: 1. Lines and tubes in stable position. 2. Cardiomegaly with unchanged bibasilar pulmonary infiltrates and/or edema. Small right pleural effusion again noted. Electronically Signed   By: Marcello Moores  Register   On: 03/07/2016 07:35   Dg Chest Port 1 View  Result Date: 03/05/2016 CLINICAL DATA:  Right PICC line placement EXAM: PORTABLE CHEST 1 VIEW COMPARISON:  Chest CT 03/05/2016, chest radiograph 03/05/2016 FINDINGS: Right PICC line tip overlies the lower SVC. Bilateral pleural effusions and bibasilar opacities are unchanged. The remainder of the support apparatus is unchanged. IMPRESSION: Right approach PICC line tip overlying the lower SVC. Electronically Signed   By: Ulyses Jarred M.D.   On: 03/05/2016 18:18   Dg Chest Port 1 View  Result Date: 03/05/2016 CLINICAL DATA:  Dislodged endotracheal tube.  ET tube placement. EXAM: PORTABLE CHEST 1 VIEW COMPARISON:  03/05/2016 FINDINGS: Endotracheal tube with the tip 4.5 cm above the carina. Nasogastric tube coursing below the diaphragm. Small bilateral pleural effusions. Diffuse interstitial and alveolar airspace opacities. No pneumothorax. Mild stable cardiomegaly. The osseous structures are unremarkable. IMPRESSION: 1. Endotracheal tube 4.5 cm above the carina. 2. Findings most compatible with CHF. Electronically Signed   By: Kathreen Devoid   On: 03/05/2016 11:58   Dg Chest Port 1 View  Result Date: 03/05/2016 CLINICAL DATA:  Respiratory failure. EXAM: PORTABLE CHEST 1 VIEW COMPARISON:  03/04/2016. FINDINGS: Endotracheal tube and NG tube are in stable position. Cardiomegaly with bilateral pulmonary edema again noted. Slight improvement.  Bilateral pleural effusions again noted. No change. No pneumothorax. IMPRESSION: 1. Lines and tubes in stable position. 2. Cardiomegaly with bilateral pulmonary edema, improved from prior exams. Bilateral pleural effusions are unchanged. Electronically Signed   By: Marcello Moores  Register   On: 03/05/2016 07:06   Dg Chest Port 1 View  Result Date: 03/05/2016 CLINICAL DATA:  Check endotracheal tube placement EXAM: PORTABLE CHEST 1 VIEW COMPARISON:  03/04/2016 FINDINGS: Cardiac shadow remains enlarged. Endotracheal tube is noted approximately 4.4 cm above the carina. Nasogastric catheter is seen within the stomach. Right-sided pleural effusion and left retrocardiac consolidation are again seen. Multiple rib fractures are noted on the left. No pneumothorax is noted. IMPRESSION: Tubes and lines as described above. Bibasilar changes similar to that seen on the prior exam. Multiple left  rib fractures Electronically Signed   By: Inez Catalina M.D.   On: 03/05/2016 07:54   Dg Chest Port 1 View  Result Date: 03/04/2016 CLINICAL DATA:  Intubation EXAM: PORTABLE CHEST 1 VIEW COMPARISON:  Portable exam 1243 hours compared 03/04/2016 FINDINGS: Tip of endotracheal tube projects 3.9 cm above carina. Nasogastric tube extends into stomach. External pacing leads project over chest. Enlargement of cardiac silhouette with pulmonary vascular congestion. Persistent pulmonary infiltrates question edema and layered RIGHT pleural effusion. Atelectasis at LEFT base. No pneumothorax. Atherosclerotic calcification aorta. IMPRESSION: Tube positions as above. Probable mild pulmonary edema with RIGHT pleural effusion and LEFT basilar atelectasis. Electronically Signed   By: Lavonia Dana M.D.   On: 03/04/2016 13:00   Dg Chest Port 1 View  Result Date: 03/04/2016 CLINICAL DATA:  Cardiomyopathy.  Congestive heart failure EXAM: PORTABLE CHEST 1 VIEW COMPARISON:  March 01, 2016 FINDINGS: There is persistent interstitial pulmonary edema with  airspace consolidation in the lateral left perihilar region which is stable. There is a right pleural effusion which appears larger than on the prior study. There is cardiomegaly with pulmonary venous hypertension. There is persistent left base atelectasis. There is atherosclerotic calcification in the aorta. IMPRESSION: Findings consistent with a degree of congestive heart failure with slightly larger right pleural effusion. Suspect superimposed pneumonia left upper lobe/left perihilar region. Stable atelectasis left base. Cardiac silhouette unchanged. There is aortic atherosclerosis. Electronically Signed   By: Lowella Grip III M.D.   On: 03/04/2016 08:45   Dg Chest Port 1 View  Result Date: 03/01/2016 CLINICAL DATA:  Pneumonia.  Cardiomyopathy. EXAM: PORTABLE CHEST 1 VIEW COMPARISON:  February 28, 2016 FINDINGS: There is interstitial pulmonary edema. There is patchy atelectasis in the lung bases. There is focal airspace consolidation in the left upper lobe slightly lateral to the hilum on the left. There is generalized cardiomegaly with pulmonary venous hypertension. There is atherosclerotic calcification in the aorta. There is a calcified granuloma in the right mid lung, stable. No adenopathy evident. IMPRESSION: Evidence a degree of congestive heart failure. Aortic atherosclerosis. Calcified granuloma right upper lobe. Airspace opacity in the left upper lobe may represent alveolar edema but also could represent focal pneumonia. This finding is new compared to recent study. Electronically Signed   By: Lowella Grip III M.D.   On: 03/01/2016 08:04    Micro Results        Objective   Blood pressure (!) 96/58, pulse (!) 52, temperature 97.5 F (36.4 C), temperature source Axillary, resp. rate 18, height _0  (1.778 m), weight 67.6 kg (149 lb 0.5 oz), SpO2 99 %.   Intake/Output Summary (Last 24 hours) at Apr 07, 2016 2056 Last data filed at 04-07-2016 0908  Gross per 24 hour  Intake               120 ml  Output               50 ml  Net               70 ml    Exam Last recorded vital signs prior to death in the computer above,    Heent: pupils fixed, nonreactive Neck: no carotid pulse Heart: no hb Lung: no breath sounds Neuro:not responsive to pain and verbal stimuli.      Data Review   CBC w Diff: Lab Results  Component Value Date   WBC 8.0 Apr 07, 2016   HGB 9.3 (L) 2016-04-07   HCT 30.0 (L) 2016/04/07   PLT  290 26-Mar-2016   LYMPHOPCT 23 2016/03/26   MONOPCT 2 03/26/16   EOSPCT 0 03/26/2016   BASOPCT 0 03/26/16    CMP: Lab Results  Component Value Date   NA 128 (L) 03/26/2016   K 5.7 (H) 03/26/2016   CL 87 (L) 03/26/16   CO2 25 03-26-2016   BUN 85 (H) 2016/03/26   CREATININE 2.47 (H) 26-Mar-2016   CREATININE 0.82 03/15/2013   PROT 6.6 26-Mar-2016   ALBUMIN 3.1 (L) 26-Mar-2016   BILITOT 1.9 (H) Mar 26, 2016   ALKPHOS 74 03-26-16   AST 26 03/26/16   ALT 57 03-26-16  .   Total Time in preparing paper work, data evaluation and todays exam - 12 minutes  Jani Gravel M.D on 2016-03-26 at 8:56 PM  [facility]   Office  [phone]

## 2016-03-20 NOTE — Progress Notes (Signed)
Daily Progress Note   Patient Name: Chad Rogers       Date: 2016/03/18 DOB: 12/23/1935  Age: 80 y.o. MRN#: [id number] Attending Physician: Modena Jansky, MD Primary Care Physician: [name], MD Admit Date: 03/19/2016  Reason for Consultation/Follow-up: Establishing goals of care, Non pain symptom management, Pain control, Psychosocial/spiritual support and Terminal Care  Subjective: Discussed hyperkalemia and its effect on the heart as well as tx with kayexalate. Pt does not want RX. He understands that leaving untreated could precipitate a cardiac terminal event and does not want to prolong things. I shared with him that he potentially had a prognosis of hours in the setting of an acute event, or just days. He stated he understood and was not surprised to hear me say this. He reports he is ready to die.  Length of Stay: 17  Current Medications: Scheduled Meds:  . [START ON 03/20/2016] amiodarone  100 mg Oral Daily  . amiodarone  200 mg Oral BID  . budesonide (PULMICORT) nebulizer solution  0.5 mg Nebulization BID  . feeding supplement (ENSURE ENLIVE)  237 mL Oral QID  . heparin  5,000 Units Subcutaneous Q8H  . hydrocortisone cream   Topical BID  . insulin aspart  0-5 Units Subcutaneous QHS  . insulin aspart  0-9 Units Subcutaneous TID WC  . ipratropium-albuterol  3 mL Nebulization TID  . mouth rinse  15 mL Mouth Rinse BID  . metoprolol tartrate  25 mg Oral BID  . mirtazapine  15 mg Oral QHS  .  morphine injection  2 mg Intravenous NOW  . pantoprazole  40 mg Oral Daily  . sodium chloride flush  10-40 mL Intracatheter Q12H    Continuous Infusions: . sodium chloride Stopped (03/12/16 1100)    PRN Meds: acetaminophen, albuterol, diphenhydrAMINE, LORazepam, LORazepam,  morphine, morphine injection, ondansetron (ZOFRAN) IV, sodium chloride flush  Physical Exam  Constitutional:  Cachetic, acutely ill appearing man   HENT:  Head: Normocephalic and atraumatic.  Neck: Normal range of motion.  Cardiovascular:  Heart rate per monitor very irrg, a-fib/v fib/tach Rate 95-105  Pulmonary/Chest:  Increased work of breathing. Only able to be minimally conversant  Neurological: He is alert.  Skin:  Cool extremities, knees mottled  Psychiatric:  Anxious. Verbalizes he knows he is dying  Nursing note and vitals  reviewed.           Vital Signs: BP (!) 89/72 (BP Location: Left Arm)   Pulse (!) 106   Temp (!) 96.4 F (35.8 C) (Axillary)   Resp (!) 22   Ht 5\' 10"  (1.778 m)   Wt 67.8 kg (149 lb 7.6 oz)   SpO2 100%   BMI 21.45 kg/m  SpO2: SpO2: 100 % O2 Device: O2 Device: Nasal Cannula O2 Flow Rate: O2 Flow Rate (L/min): 2 L/min  Intake/output summary:  Intake/Output Summary (Last 24 hours) at 03-17-2016 1007 Last data filed at 03/17/16 0908  Gross per 24 hour  Intake              600 ml  Output              200 ml  Net              400 ml   LBM: Last BM Date: 02/13/16 Baseline Weight: Weight: 68.2 kg (150 lb 5.7 oz) Most recent weight: Weight: 67.8 kg (149 lb 7.6 oz)       Palliative Assessment/Data:    Flowsheet Rows   Flowsheet Row Most Recent Value  Intake Tab  Referral Department  Hospitalist  Unit at Time of Referral  Med/Surg Unit  Palliative Care Primary Diagnosis  Pulmonary  Date Notified  03/15/16  Palliative Care Type  New Palliative care  Reason for referral  Non-pain Symptom, Pain, Clarify Goals of Care, Psychosocial or Spiritual support  Date of Admission  02/25/2016  Date first seen by Palliative Care  03/15/16  # of days Palliative referral response time  0 Day(s)  # of days IP prior to Palliative referral  16  Clinical Assessment  Palliative Performance Scale Score  30%  Pain Max last 24 hours  Not able to report  Pain  Min Last 24 hours  Not able to report  Dyspnea Max Last 24 Hours  Not able to report  Dyspnea Min Last 24 hours  Not able to report  Nausea Max Last 24 Hours  Not able to report  Nausea Min Last 24 Hours  Not able to report  Anxiety Max Last 24 Hours  Not able to report  Anxiety Min Last 24 Hours  Not able to report  Other Max Last 24 Hours  Not able to report  Psychosocial & Spiritual Assessment  Palliative Care Outcomes  Palliative Care follow-up planned  Yes, Facility      Patient Active Problem List   Diagnosis Date Noted  . Palliative care encounter   . Acute renal failure with acute tubular necrosis superimposed on stage 2 chronic kidney disease ([place])   . Cardiogenic shock ([place])   . Acute hypoxemic respiratory failure ([place])   . Acute pulmonary edema ([facility])   . Acute combined systolic and diastolic heart failure ([place])   . Protein-calorie malnutrition, severe 03/04/2016  . Pressure injury of skin 03/04/2016  . Metabolic acidosis   . CHF (congestive heart failure) ([place])   . Endotracheally intubated   . Acute encephalopathy   . AKI (acute kidney injury) ([place])   . Hyperkalemia   . Sepsis ([place]) 03/14/2016  . HCAP (healthcare-associated pneumonia) 02/24/2016  . Hypotension 02/23/2016  . Hyponatremia 02/23/2016  . COPD (chronic obstructive pulmonary disease) ([place]) 02/23/2016  . Debility 02/15/2016  . Atrial fibrillation, persistent ([place]) 01/15/2016  . Absent pulse in lower extremity   . Acute on chronic systolic congestive heart failure ([place]) 01/01/2016  . NSVT (  nonsustained ventricular tachycardia) ([place]) 01/01/2016  . Respiratory failure ([place])   . Traumatic fracture of ribs with pneumothorax 12/29/2015  . Multiple rib fractures 12/29/2015  . Cardiomyopathy ([place])   . Hypertension   . Pulmonary fibrosis ([place])   . Osteoarthritis   . Tobacco abuse     Palliative Care Assessment & Plan   Patient Profile: 80 y.o. male  with past medical history of Osteoarthritis, coronary  artery disease, hypertension, atrial fibrillation (not on anticoagulation, history of falls), systolic congestive heart failure with an ejection fraction of 25%, hyperlipidemia, pulmonary fibrosis, COPD, (previously followed by Dr. Shyrl Numbers beginning in 2011), alcohol use admitted on 03/06/2016 with complaints of shortness of breath to [facility] on 02/23/2016. Patient recently has had a prolonged hospitalization after a fall with several rib fractures which required prolonged intubation and ultimately a tracheostomy. He was then admitted to an [facility] for rehabilitation. He was discharged home one week prior to presentation to [facility] on 03/06/2016. In ED, he was hypotensive, hypoxic and had an elevated lactic acid level of 7. Workup was concerning for new infiltrate and volume depletion with acute kidney injury. He also was found to have profound bradycardia  and pauses. Atropine was administered and he was started on external pacemaker. Heart rate was labile and he was started on dopamine. He developed respiratory distress with hypoxemia, sats dropped into the 70s, with altered level of consciousness which required intubation. His potassium was 6.3, and on 03/04/2016 he was transferred to [facility] for further care. He was  transferred to the floor but is now being transferred to step down unit for closer monitoring 2/2 resp distress  Assessment: Pt is alert but VERY SHOB. At this point he can only shake his head yes/ no . Understands he is dying. I have caled his family. He is c/o nausea. No vomiting  Recommendations/Plan:  Dyspnea: Will add IV MS04 2-4 q2 prn to PO order. Monitor for need for scheduled dosing  Anxiety: Will add PO/IV ativan PRN. Monitor for need for scheduled dosing  Nausea: Add IV zofran PRN  Goals of Care and Additional Recommendations:  Limitations on Scope of Treatment: Minimize Medications, Initiate Comfort Feeding, No Artificial Feeding, No Blood  Transfusions, No Chemotherapy, No Hemodialysis, No Radiation, No Surgical Procedures and No Tracheostomy  Code Status:    Code Status Orders        Start     Ordered   03/15/16 2058  Do not attempt resuscitation (DNR)  Continuous    Question Answer Comment  In the event of cardiac or respiratory ARREST Do not call a "code blue"   In the event of cardiac or respiratory ARREST Do not perform Intubation, CPR, defibrillation or ACLS   In the event of cardiac or respiratory ARREST Use medication by any route, position, wound care, and other measures to relive pain and suffering. May use oxygen, suction and manual treatment of airway obstruction as needed for comfort.      03/15/16 2058    Code Status History    Date Active Date Inactive Code Status Order ID Comments User Context   02/18/2016  9:17 PM 03/15/2016  8:58 PM Full Code [id number]  Orvan Falconer, MD ED   02/15/2016  5:04 PM 02/23/2016  3:00 PM Full Code [id number]  Bary Leriche, PA-C Inpatient   01/19/2016  5:56 PM 02/15/2016  4:30 PM Full Code [id number]  [facility] Inpatient   12/29/2015  6:03 AM 01/19/2016  5:32  PM Full Code [id number]  Georganna Skeans, MD Inpatient       Prognosis:   Hours - Days in the setting of end stage lung disease with assoc sCHF with EF 25% and AKI, creatinine 2.27, potassium 5.7 ( pt does want kayexalate)  Discharge Planning:  Anticipated Hospital Death  Care plan was discussed with Dr. Algis Liming  Thank you for allowing the Palliative Medicine Team to assist in the care of this patient.   Time In: 0900 Time Out: 1000 Total Time 60 min Prolonged Time Billed  no       Greater than 50%  of this time was spent counseling and coordinating care related to the above assessment and plan.  Dory Horn, NP  Please contact Palliative Medicine Team phone at [phone] for questions and concerns.

## 2016-03-20 NOTE — Progress Notes (Signed)
Called by nurse due to increased agitation.  He received ativan and was resting more comfortably on my arrival.  He appears close to death.  I will not be surprised if he dies in the next several hours.  I increased ativan prn in case of further agitation.  Provided anticipatory counseling to family.  No charge note.  Micheline Rough, MD [facility] Team [phone]

## 2016-03-20 NOTE — Progress Notes (Signed)
Bed Control notified that pt's body transported to the morgue by CNA.

## 2016-03-20 NOTE — Progress Notes (Addendum)
Family members left [facility] and DR. Kim came to see the body and issued death certificate.  CDS was informed for referral and not a donor candidate.  Body preparation not yet complete.  Wasted 248 mL of morphine 250 mg in NS with S. Tilman,Charge RN in sink.

## 2016-03-20 NOTE — Progress Notes (Signed)
Patient expired at 73 with Mable Fill, RN confirming and family members at bedside.  On call [facility] MD made aware for death certificate issuance.  Family members at bedside and grieving.

## 2016-03-20 DEATH — deceased

## 2018-03-08 IMAGING — CT CT CHEST W/ CM
3 of 4 series · 14 of 36 positions shown, 16 images · IV contrast (iopamidol)
Comparison: Prior radiographs

CLINICAL DATA: 79-year-old male with acute left chest pain,
abdominal and pelvic pain following fall.

EXAM:
CT CHEST, ABDOMEN, AND PELVIS WITH CONTRAST
TECHNIQUE: Multidetector CT imaging of the chest, abdomen and pelvis was
performed following the standard protocol during bolus
administration of intravenous contrast.
CONTRAST:  100mL [id number] IOPAMIDOL ([id number]) INJECTION 61%

[Series 2: chest abd pel with contrast · axial · 0.79mm/px · z∈[+82,+162]mm · 2 of 130 slices shown]
[im 9/130  mediastinal]
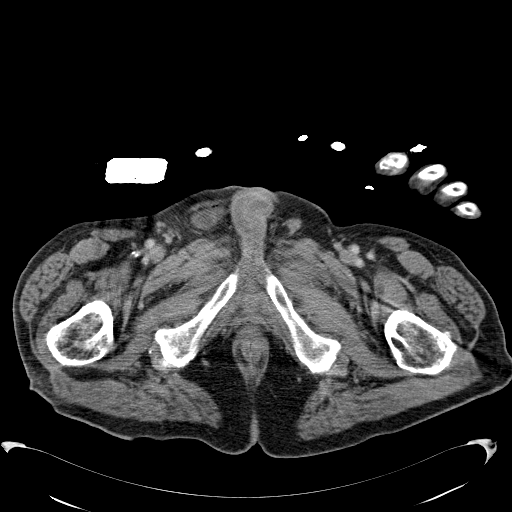
[im 25/130  mediastinal]
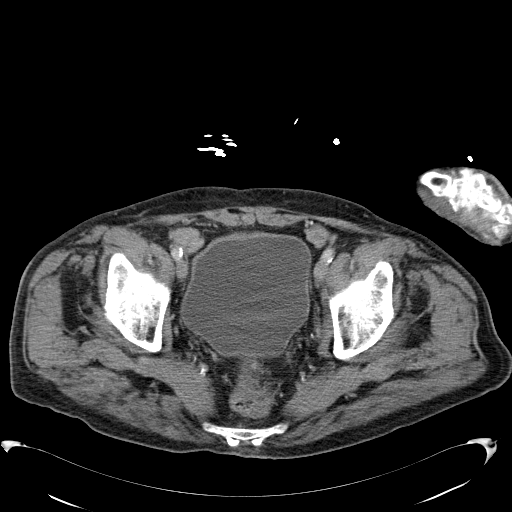

[Series 3: coronal cor · coronal · 0.83mm/px · 3 of 58 slices shown]
[im 12/58  mediastinal]
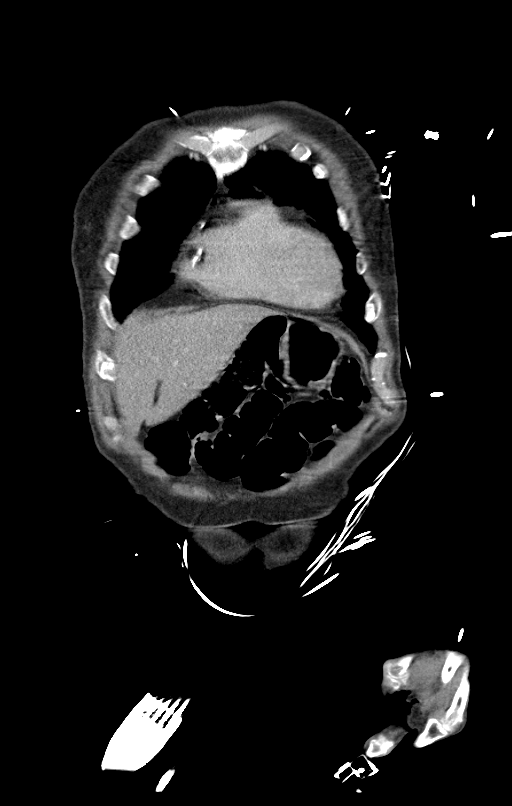
[im 23/58  mediastinal]
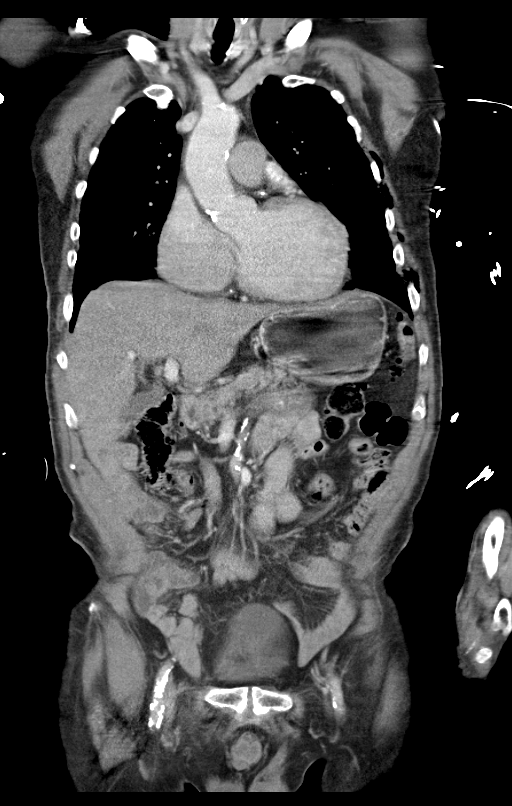
[im 35/58  mediastinal]
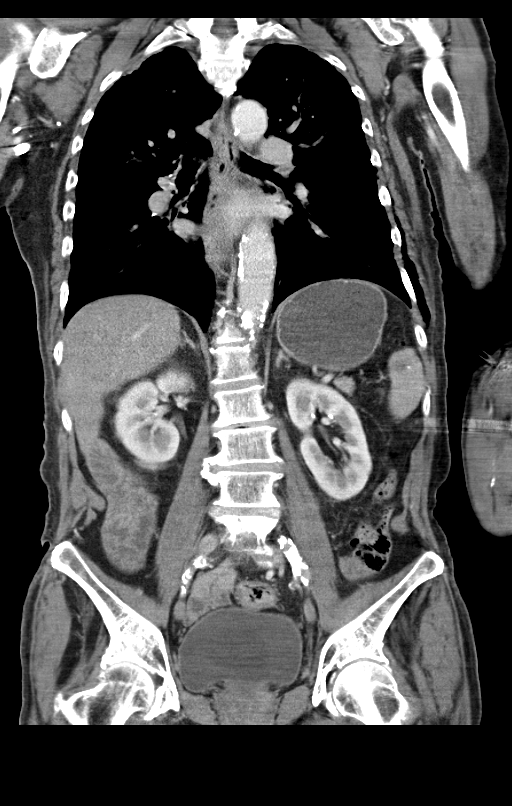

[Series 8: kidney delays · axial · 0.75mm/px · z∈[+80,+416]mm · 9 of 85 slices shown, 11 images]
[im 9/85  mediastinal]
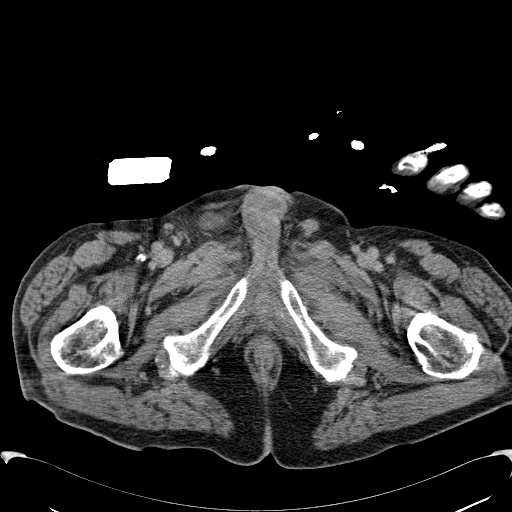
[im 9/85  bone]
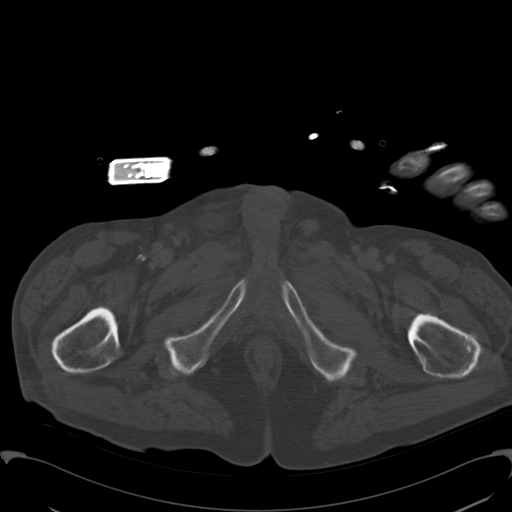
[im 17/85  mediastinal]
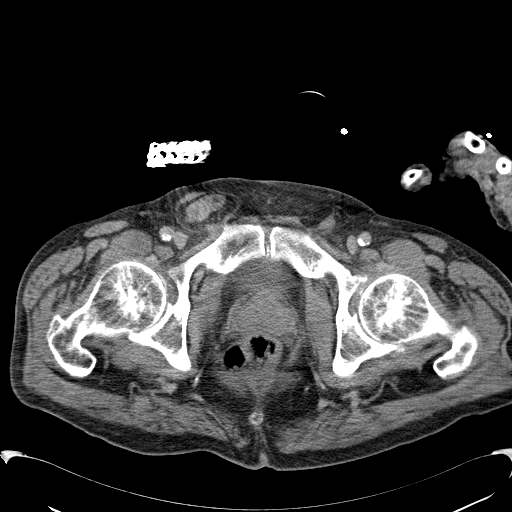
[im 26/85  mediastinal]
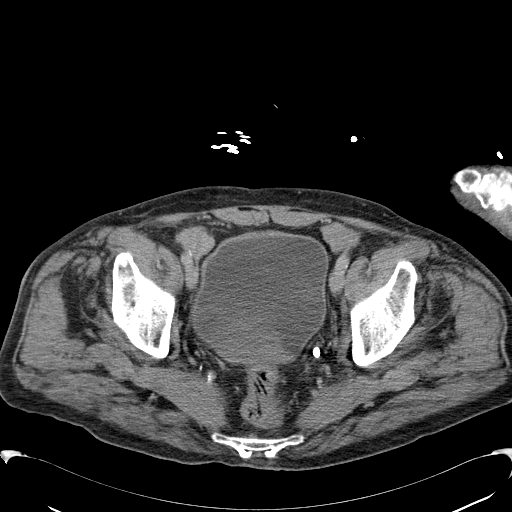
[im 34/85  mediastinal]
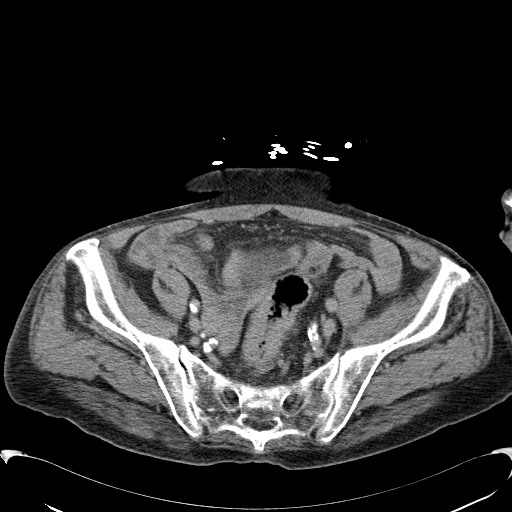
[im 43/85  mediastinal]
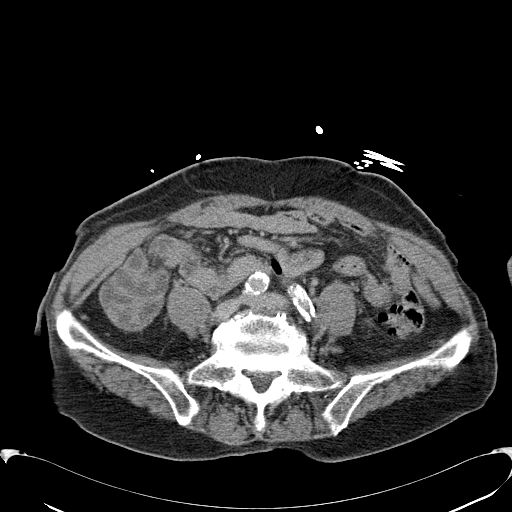
[im 51/85  mediastinal]
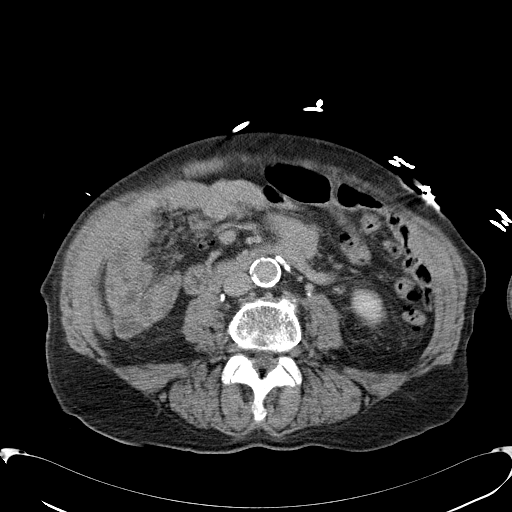
[im 59/85  mediastinal]
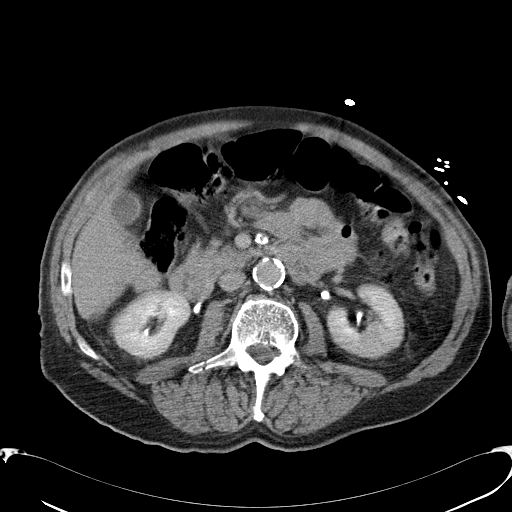
[im 68/85  mediastinal]
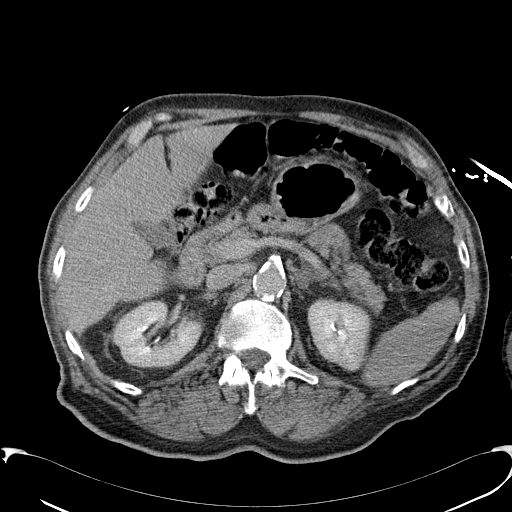
[im 76/85  mediastinal]
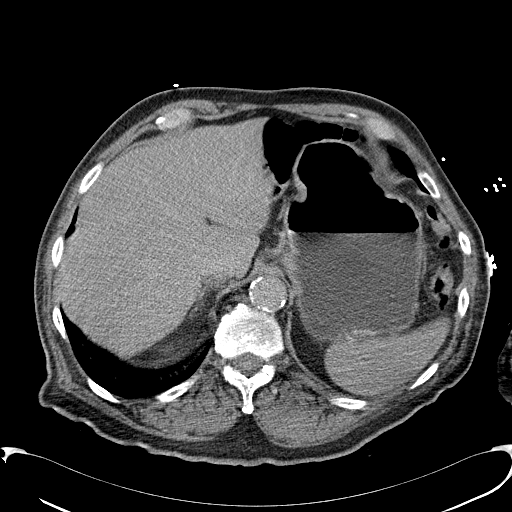
[im 76/85  bone]
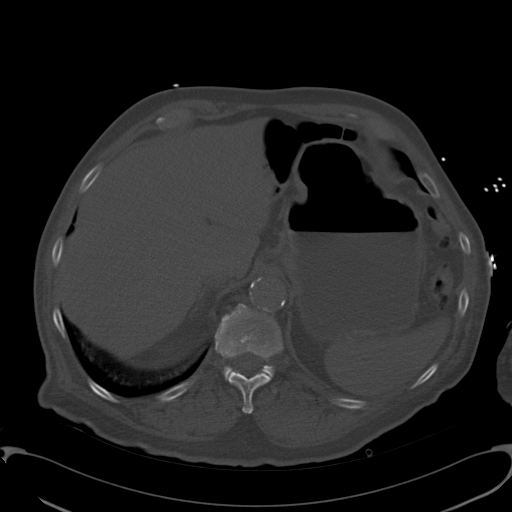

[14 of 36 positions shown; findings below may reference images not displayed]

FINDINGS: CT CHEST FINDINGS

Cardiovascular: Cardiomegaly and coronary artery calcifications
noted. There is no evidence of thoracic aortic aneurysm.

Mediastinum/Nodes: No mediastinal mass, enlarged lymph nodes or
pericardial effusion.

Lungs/Pleura: A small anterior inferior left pneumothorax
identified. Mild dependent and basilar atelectasis noted. No
airspace disease, suspicious nodule, mass or consolidation
identified.

Musculoskeletal: There are acute fractures of the left third through
twelfth ribs. Fractures of the lateral fourth through seventh ribs
are displaced. Left subcutaneous emphysema is identified.

CT ABDOMEN PELVIS FINDINGS

Hepatobiliary: Hepatic steatosis identified. The gallbladder is
unremarkable. There is no evidence of biliary dilatation.

Pancreas: Unremarkable

Spleen: Unremarkable

Adrenals/Urinary Tract: The kidneys, adrenal glands and bladder are
unremarkable.

Stomach/Bowel: No bowel obstruction or definite bowel wall
thickening. The appendix is normal.

Vascular/Lymphatic: Heavy abdominal aortic atherosclerotic
calcifications noted without aneurysm. No enlarged lymph nodes
identified.

Reproductive: Unremarkable

Other: No free fluid or pneumoperitoneum. A small to moderate right
inguinal hernia containing small bowel noted without bowel
obstruction. A small left inguinal hernia containing fat is present.

Musculoskeletal: A nondisplaced fracture of the anterior left
acetabulum noted. Nondisplaced fractures of the right L3 and L4
transverse process is noted.
IMPRESSION: Acute fractures of the left third through twelfth ribs with small
left pneumothorax and left subcutaneous emphysema.

Acute nondisplaced fractures of the anterior left acetabulum and
right L3 and L4 transverse processes.

No evidence of solid or hollow visceral injury.

Cardiomegaly and coronary artery disease.

Hepatic steatosis.

Abdominal aortic atherosclerosis.

Critical Value/emergent results were called by telephone at the time
of interpretation on 12/29/2015 at [DATE] to Dr. LETHATA MOCHELA , who
verbally acknowledged these results.

## 2018-04-23 IMAGING — DX DG CHEST 1V PORT
1 series · 1 of 1 positions shown · non-contrast
Comparison: 01/21/2016

CLINICAL DATA: Hypoxia

EXAM:
PORTABLE CHEST 1 VIEW

[chest ap]
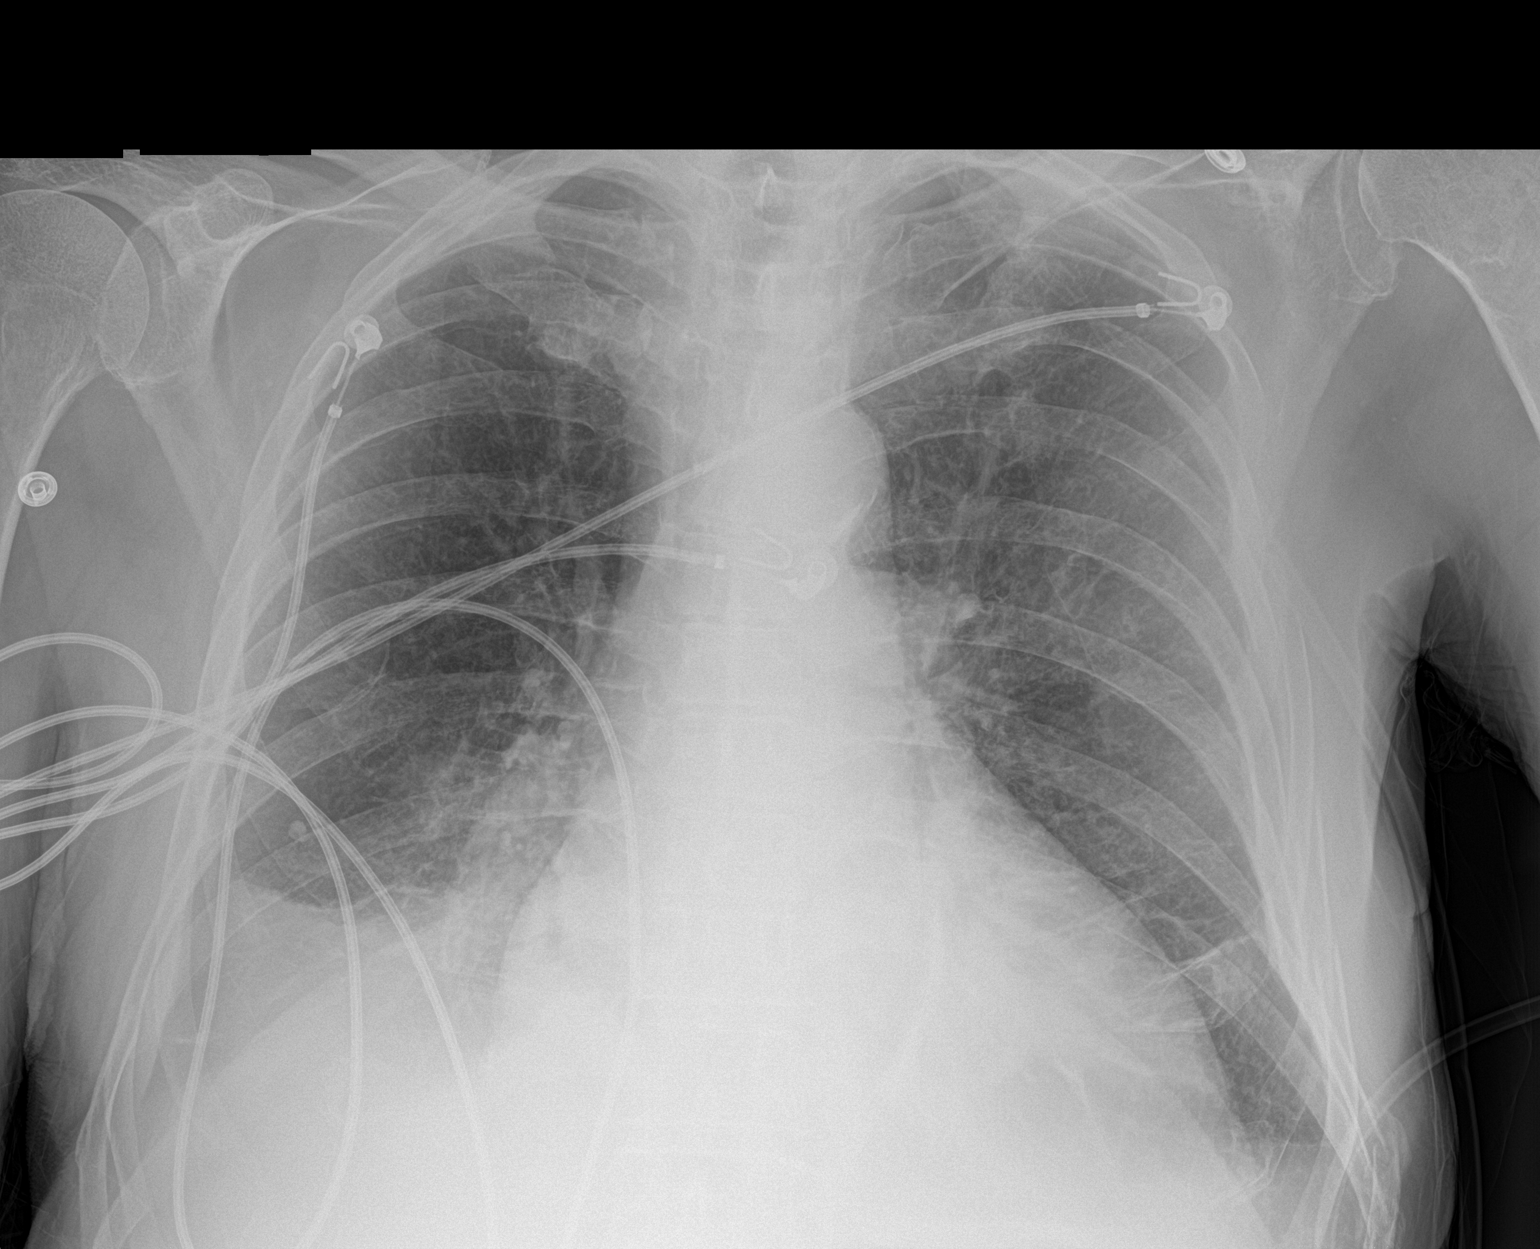

[1 of 1 positions shown; findings below may reference images not displayed]

FINDINGS: Small right pleural effusion. Lingular scarring/atelectasis. No
pneumothorax.

Cardiomegaly.
IMPRESSION: Small right pleural effusion.
